# Patient Record
Sex: Female | Born: 1950
Health system: Southern US, Community
[De-identification: ages and names within clinical notes are randomized; demographics above are authoritative.]

## PROBLEM LIST (undated history)

## (undated) DIAGNOSIS — E785 Hyperlipidemia, unspecified: Secondary | ICD-10-CM

## (undated) DIAGNOSIS — I499 Cardiac arrhythmia, unspecified: Secondary | ICD-10-CM

## (undated) DIAGNOSIS — R319 Hematuria, unspecified: Secondary | ICD-10-CM

## (undated) DIAGNOSIS — I10 Essential (primary) hypertension: Secondary | ICD-10-CM

## (undated) DIAGNOSIS — Z87442 Personal history of urinary calculi: Secondary | ICD-10-CM

## (undated) DIAGNOSIS — R51 Headache: Secondary | ICD-10-CM

## (undated) DIAGNOSIS — E119 Type 2 diabetes mellitus without complications: Secondary | ICD-10-CM

## (undated) DIAGNOSIS — N760 Acute vaginitis: Secondary | ICD-10-CM

## (undated) HISTORY — DX: Type 2 diabetes mellitus without complications: E11.9

## (undated) HISTORY — DX: Headache: R51

## (undated) HISTORY — PX: TUBAL LIGATION: SHX77

## (undated) HISTORY — DX: Hyperlipidemia, unspecified: E78.5

## (undated) HISTORY — PX: OTHER SURGICAL HISTORY: SHX169

## (undated) HISTORY — DX: Essential (primary) hypertension: I10

## (undated) HISTORY — DX: Acute vaginitis: N76.0

---
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
## 1996-03-08 HISTORY — PX: OTHER SURGICAL HISTORY: SHX169

## 1996-03-08 HISTORY — PX: TOTAL ABDOMINAL HYSTERECTOMY W/ BILATERAL SALPINGOOPHORECTOMY: SHX83

## 2003-04-08 ENCOUNTER — Ambulatory Visit (HOSPITAL_COMMUNITY): Admission: RE | Admit: 2003-04-08 | Discharge: 2003-04-08 | Payer: Self-pay | Admitting: *Deleted

## 2003-07-02 ENCOUNTER — Ambulatory Visit (HOSPITAL_COMMUNITY): Admission: RE | Admit: 2003-07-02 | Discharge: 2003-07-02 | Payer: Self-pay | Admitting: Family Medicine

## 2004-02-06 ENCOUNTER — Ambulatory Visit (HOSPITAL_COMMUNITY): Admission: RE | Admit: 2004-02-06 | Discharge: 2004-02-06 | Payer: Self-pay | Admitting: Urology

## 2004-02-18 ENCOUNTER — Ambulatory Visit: Payer: Self-pay | Admitting: Family Medicine

## 2004-03-05 ENCOUNTER — Ambulatory Visit (HOSPITAL_COMMUNITY): Admission: RE | Admit: 2004-03-05 | Discharge: 2004-03-05 | Payer: Self-pay | Admitting: Family Medicine

## 2004-04-07 ENCOUNTER — Ambulatory Visit: Payer: Self-pay | Admitting: Family Medicine

## 2004-05-05 ENCOUNTER — Ambulatory Visit: Payer: Self-pay | Admitting: Family Medicine

## 2004-10-07 ENCOUNTER — Ambulatory Visit: Payer: Self-pay | Admitting: Family Medicine

## 2005-03-04 ENCOUNTER — Ambulatory Visit: Payer: Self-pay | Admitting: Family Medicine

## 2005-06-01 ENCOUNTER — Ambulatory Visit: Payer: Self-pay | Admitting: Family Medicine

## 2005-10-07 ENCOUNTER — Ambulatory Visit: Payer: Self-pay | Admitting: Family Medicine

## 2005-10-07 ENCOUNTER — Other Ambulatory Visit: Admission: RE | Admit: 2005-10-07 | Discharge: 2005-10-07 | Payer: Self-pay | Admitting: Family Medicine

## 2005-10-07 ENCOUNTER — Encounter (INDEPENDENT_AMBULATORY_CARE_PROVIDER_SITE_OTHER): Payer: Self-pay | Admitting: *Deleted

## 2005-10-07 LAB — CONVERTED CEMR LAB: Pap Smear: NORMAL

## 2006-01-24 ENCOUNTER — Ambulatory Visit: Payer: Self-pay | Admitting: Family Medicine

## 2006-04-26 ENCOUNTER — Encounter: Payer: Self-pay | Admitting: Family Medicine

## 2006-04-26 LAB — CONVERTED CEMR LAB
BUN: 19 mg/dL (ref 6–23)
CO2: 30 meq/L (ref 19–32)
Calcium: 10.2 mg/dL (ref 8.4–10.5)
Chloride: 101 meq/L (ref 96–112)
Cholesterol: 239 mg/dL — ABNORMAL HIGH (ref 0–200)
Creatinine, Ser: 0.61 mg/dL (ref 0.40–1.20)
Glucose, Bld: 118 mg/dL — ABNORMAL HIGH (ref 70–99)
HDL: 79 mg/dL (ref >39)
Hgb A1c MFr Bld: 7.7 % — ABNORMAL HIGH (ref 4.6–6.1)
LDL Cholesterol: 147 mg/dL — ABNORMAL HIGH (ref 0–99)
Potassium: 4.3 meq/L (ref 3.5–5.3)
Sodium: 142 meq/L (ref 135–145)
Total CHOL/HDL Ratio: 3
Triglycerides: 65 mg/dL (ref <150)
VLDL: 13 mg/dL (ref 0–40)

## 2006-05-03 ENCOUNTER — Ambulatory Visit: Payer: Self-pay | Admitting: Family Medicine

## 2006-05-05 ENCOUNTER — Ambulatory Visit: Payer: Self-pay | Admitting: Family Medicine

## 2006-06-14 ENCOUNTER — Ambulatory Visit: Payer: Self-pay | Admitting: Family Medicine

## 2006-08-05 ENCOUNTER — Encounter: Payer: Self-pay | Admitting: Family Medicine

## 2006-08-05 LAB — CONVERTED CEMR LAB
ALT: 13 units/L (ref 0–35)
AST: 14 units/L (ref 0–37)
Albumin: 5 g/dL (ref 3.5–5.2)
Alkaline Phosphatase: 102 units/L (ref 39–117)
BUN: 17 mg/dL (ref 6–23)
Bilirubin, Direct: 0.1 mg/dL (ref 0.0–0.3)
CO2: 26 meq/L (ref 19–32)
Calcium: 9.4 mg/dL (ref 8.4–10.5)
Chloride: 103 meq/L (ref 96–112)
Cholesterol: 258 mg/dL — ABNORMAL HIGH (ref 0–200)
Creatinine, Ser: 0.63 mg/dL (ref 0.40–1.20)
Glucose, Bld: 123 mg/dL — ABNORMAL HIGH (ref 70–99)
HDL: 84 mg/dL (ref >39)
Hgb A1c MFr Bld: 7.5 % — ABNORMAL HIGH (ref 4.6–6.1)
Indirect Bilirubin: 0.4 mg/dL (ref 0.0–0.9)
LDL Cholesterol: 165 mg/dL — ABNORMAL HIGH (ref 0–99)
Microalb, Ur: 0.54 mg/dL (ref 0.00–1.89)
Potassium: 4 meq/L (ref 3.5–5.3)
Sodium: 142 meq/L (ref 135–145)
Total Bilirubin: 0.5 mg/dL (ref 0.3–1.2)
Total CHOL/HDL Ratio: 3.1
Total Protein: 7.8 g/dL (ref 6.0–8.3)
Triglycerides: 46 mg/dL (ref <150)
VLDL: 9 mg/dL (ref 0–40)

## 2006-08-08 ENCOUNTER — Ambulatory Visit: Payer: Self-pay | Admitting: Family Medicine

## 2006-11-15 ENCOUNTER — Encounter: Payer: Self-pay | Admitting: Family Medicine

## 2006-11-15 LAB — CONVERTED CEMR LAB
ALT: 10 units/L (ref 0–35)
AST: 12 units/L (ref 0–37)
Albumin: 4.9 g/dL (ref 3.5–5.2)
Alkaline Phosphatase: 95 units/L (ref 39–117)
BUN: 19 mg/dL (ref 6–23)
Bilirubin, Direct: 0.1 mg/dL (ref 0.0–0.3)
CO2: 27 meq/L (ref 19–32)
Calcium: 9.6 mg/dL (ref 8.4–10.5)
Chloride: 104 meq/L (ref 96–112)
Cholesterol: 275 mg/dL — ABNORMAL HIGH (ref 0–200)
Creatinine, Ser: 0.66 mg/dL (ref 0.40–1.20)
Glucose, Bld: 139 mg/dL — ABNORMAL HIGH (ref 70–99)
HDL: 91 mg/dL (ref >39)
Indirect Bilirubin: 0.5 mg/dL (ref 0.0–0.9)
LDL Cholesterol: 171 mg/dL — ABNORMAL HIGH (ref 0–99)
Potassium: 4.3 meq/L (ref 3.5–5.3)
Sodium: 144 meq/L (ref 135–145)
Total Bilirubin: 0.6 mg/dL (ref 0.3–1.2)
Total CHOL/HDL Ratio: 3
Total Protein: 7.8 g/dL (ref 6.0–8.3)
Triglycerides: 63 mg/dL (ref <150)
VLDL: 13 mg/dL (ref 0–40)

## 2006-11-17 ENCOUNTER — Ambulatory Visit: Payer: Self-pay | Admitting: Family Medicine

## 2007-01-05 ENCOUNTER — Ambulatory Visit: Payer: Self-pay | Admitting: Family Medicine

## 2007-03-06 ENCOUNTER — Encounter: Payer: Self-pay | Admitting: Family Medicine

## 2007-03-06 LAB — CONVERTED CEMR LAB
ALT: 15 units/L (ref 0–35)
AST: 14 units/L (ref 0–37)
Albumin: 4.9 g/dL (ref 3.5–5.2)
Alkaline Phosphatase: 112 units/L (ref 39–117)
Bilirubin, Direct: 0.1 mg/dL (ref 0.0–0.3)
Cholesterol: 221 mg/dL — ABNORMAL HIGH (ref 0–200)
HDL: 86 mg/dL (ref >39)
Indirect Bilirubin: 0.4 mg/dL (ref 0.0–0.9)
LDL Cholesterol: 124 mg/dL — ABNORMAL HIGH (ref 0–99)
Total Bilirubin: 0.5 mg/dL (ref 0.3–1.2)
Total CHOL/HDL Ratio: 2.6
Total Protein: 7.6 g/dL (ref 6.0–8.3)
Triglycerides: 55 mg/dL (ref <150)
VLDL: 11 mg/dL (ref 0–40)

## 2007-03-08 ENCOUNTER — Ambulatory Visit: Payer: Self-pay | Admitting: Family Medicine

## 2007-06-19 ENCOUNTER — Encounter: Payer: Self-pay | Admitting: Family Medicine

## 2007-06-19 LAB — CONVERTED CEMR LAB
ALT: 19 units/L (ref 0–35)
AST: 14 units/L (ref 0–37)
Albumin: 4.8 g/dL (ref 3.5–5.2)
Alkaline Phosphatase: 111 units/L (ref 39–117)
Bilirubin, Direct: 0.1 mg/dL (ref 0.0–0.3)
Cholesterol: 254 mg/dL — ABNORMAL HIGH (ref 0–200)
HDL: 80 mg/dL (ref >39)
Indirect Bilirubin: 0.3 mg/dL (ref 0.0–0.9)
LDL Cholesterol: 155 mg/dL — ABNORMAL HIGH (ref 0–99)
Total Bilirubin: 0.4 mg/dL (ref 0.3–1.2)
Total CHOL/HDL Ratio: 3.2
Total Protein: 7.6 g/dL (ref 6.0–8.3)
Triglycerides: 95 mg/dL (ref <150)
VLDL: 19 mg/dL (ref 0–40)

## 2007-06-20 ENCOUNTER — Ambulatory Visit: Payer: Self-pay | Admitting: Family Medicine

## 2007-06-28 ENCOUNTER — Ambulatory Visit (HOSPITAL_COMMUNITY): Admission: RE | Admit: 2007-06-28 | Discharge: 2007-06-28 | Payer: Self-pay | Admitting: Family Medicine

## 2007-06-30 ENCOUNTER — Encounter (INDEPENDENT_AMBULATORY_CARE_PROVIDER_SITE_OTHER): Payer: Self-pay | Admitting: *Deleted

## 2007-06-30 DIAGNOSIS — E785 Hyperlipidemia, unspecified: Secondary | ICD-10-CM | POA: Insufficient documentation

## 2007-06-30 DIAGNOSIS — I10 Essential (primary) hypertension: Secondary | ICD-10-CM | POA: Insufficient documentation

## 2007-09-19 ENCOUNTER — Ambulatory Visit (HOSPITAL_COMMUNITY): Admission: RE | Admit: 2007-09-19 | Discharge: 2007-09-19 | Payer: Self-pay | Admitting: General Surgery

## 2007-09-19 LAB — HM COLONOSCOPY: HM Colonoscopy: NORMAL

## 2007-12-07 ENCOUNTER — Encounter: Payer: Self-pay | Admitting: Family Medicine

## 2007-12-21 ENCOUNTER — Encounter: Payer: Self-pay | Admitting: Family Medicine

## 2007-12-22 ENCOUNTER — Ambulatory Visit: Payer: Self-pay | Admitting: Family Medicine

## 2007-12-22 LAB — CONVERTED CEMR LAB
ALT: 17 units/L (ref 0–35)
AST: 14 units/L (ref 0–37)
Albumin: 4.9 g/dL (ref 3.5–5.2)
Alkaline Phosphatase: 97 units/L (ref 39–117)
BUN: 15 mg/dL (ref 6–23)
Bilirubin, Direct: 0.1 mg/dL (ref 0.0–0.3)
CO2: 26 meq/L (ref 19–32)
Calcium: 9.9 mg/dL (ref 8.4–10.5)
Chloride: 102 meq/L (ref 96–112)
Cholesterol: 222 mg/dL — ABNORMAL HIGH (ref 0–200)
Creatinine, Ser: 0.58 mg/dL (ref 0.40–1.20)
Glucose, Bld: 151 mg/dL — ABNORMAL HIGH (ref 70–99)
Glucose, Bld: 148 mg/dL
HDL: 75 mg/dL (ref >39)
Hgb A1c MFr Bld: 8 %
Indirect Bilirubin: 0.4 mg/dL (ref 0.0–0.9)
LDL Cholesterol: 131 mg/dL — ABNORMAL HIGH (ref 0–99)
Potassium: 4.3 meq/L (ref 3.5–5.3)
Sodium: 143 meq/L (ref 135–145)
Total Bilirubin: 0.5 mg/dL (ref 0.3–1.2)
Total CHOL/HDL Ratio: 3
Total Protein: 7.5 g/dL (ref 6.0–8.3)
Triglycerides: 81 mg/dL (ref <150)
VLDL: 16 mg/dL (ref 0–40)

## 2008-01-01 ENCOUNTER — Encounter: Payer: Self-pay | Admitting: Family Medicine

## 2008-01-02 ENCOUNTER — Encounter: Payer: Self-pay | Admitting: Family Medicine

## 2008-01-04 ENCOUNTER — Encounter: Payer: Self-pay | Admitting: Family Medicine

## 2008-01-05 ENCOUNTER — Encounter: Payer: Self-pay | Admitting: Family Medicine

## 2008-01-11 ENCOUNTER — Encounter: Payer: Self-pay | Admitting: Family Medicine

## 2008-01-15 ENCOUNTER — Encounter: Payer: Self-pay | Admitting: Family Medicine

## 2008-03-12 ENCOUNTER — Telehealth: Payer: Self-pay | Admitting: Family Medicine

## 2008-03-12 ENCOUNTER — Encounter: Payer: Self-pay | Admitting: Family Medicine

## 2008-03-13 LAB — CONVERTED CEMR LAB
ALT: 13 units/L (ref 0–35)
AST: 14 units/L (ref 0–37)
Albumin: 4.9 g/dL (ref 3.5–5.2)
Alkaline Phosphatase: 87 units/L (ref 39–117)
BUN: 16 mg/dL (ref 6–23)
Bilirubin, Direct: 0.1 mg/dL (ref 0.0–0.3)
CO2: 24 meq/L (ref 19–32)
Calcium: 9.8 mg/dL (ref 8.4–10.5)
Chloride: 103 meq/L (ref 96–112)
Cholesterol: 233 mg/dL — ABNORMAL HIGH (ref 0–200)
Creatinine, Ser: 0.46 mg/dL (ref 0.40–1.20)
Glucose, Bld: 124 mg/dL — ABNORMAL HIGH (ref 70–99)
HDL: 86 mg/dL (ref >39)
Indirect Bilirubin: 0.4 mg/dL (ref 0.0–0.9)
LDL Cholesterol: 135 mg/dL — ABNORMAL HIGH (ref 0–99)
Potassium: 4 meq/L (ref 3.5–5.3)
Sodium: 141 meq/L (ref 135–145)
Total Bilirubin: 0.5 mg/dL (ref 0.3–1.2)
Total CHOL/HDL Ratio: 2.7
Total Protein: 7.9 g/dL (ref 6.0–8.3)
Triglycerides: 58 mg/dL (ref <150)
VLDL: 12 mg/dL (ref 0–40)

## 2008-03-14 ENCOUNTER — Ambulatory Visit: Payer: Self-pay | Admitting: Family Medicine

## 2008-03-14 LAB — CONVERTED CEMR LAB
Glucose, Bld: 104 mg/dL
Hgb A1c MFr Bld: 7.2 %

## 2008-03-15 ENCOUNTER — Encounter: Payer: Self-pay | Admitting: Family Medicine

## 2008-03-15 LAB — CONVERTED CEMR LAB
Creatinine, Urine: 82.5 mg/dL
Microalb Creat Ratio: 6.7 mg/g (ref 0.0–30.0)
Microalb, Ur: 0.55 mg/dL (ref 0.00–1.89)

## 2008-03-19 ENCOUNTER — Ambulatory Visit (HOSPITAL_COMMUNITY): Admission: RE | Admit: 2008-03-19 | Discharge: 2008-03-19 | Payer: Self-pay | Admitting: Urology

## 2008-03-25 ENCOUNTER — Encounter: Payer: Self-pay | Admitting: Family Medicine

## 2008-05-20 ENCOUNTER — Encounter: Payer: Self-pay | Admitting: Family Medicine

## 2008-06-12 LAB — CONVERTED CEMR LAB
ALT: 16 units/L (ref 0–35)
AST: 18 units/L (ref 0–37)
Albumin: 4.9 g/dL (ref 3.5–5.2)
Alkaline Phosphatase: 90 units/L (ref 39–117)
BUN: 15 mg/dL (ref 6–23)
Bilirubin, Direct: 0.1 mg/dL (ref 0.0–0.3)
CO2: 27 meq/L (ref 19–32)
Calcium: 9.6 mg/dL (ref 8.4–10.5)
Chloride: 101 meq/L (ref 96–112)
Cholesterol: 221 mg/dL — ABNORMAL HIGH (ref 0–200)
Creatinine, Ser: 0.57 mg/dL (ref 0.40–1.20)
Creatinine, Urine: 166.9 mg/dL
Glucose, Bld: 109 mg/dL — ABNORMAL HIGH (ref 70–99)
HDL: 84 mg/dL (ref >39)
Indirect Bilirubin: 0.4 mg/dL (ref 0.0–0.9)
LDL Cholesterol: 125 mg/dL — ABNORMAL HIGH (ref 0–99)
Microalb Creat Ratio: 5.9 mg/g (ref 0.0–30.0)
Microalb, Ur: 0.99 mg/dL (ref 0.00–1.89)
Potassium: 4.1 meq/L (ref 3.5–5.3)
Sodium: 144 meq/L (ref 135–145)
Total Bilirubin: 0.5 mg/dL (ref 0.3–1.2)
Total CHOL/HDL Ratio: 2.6
Total Protein: 7.1 g/dL (ref 6.0–8.3)
Triglycerides: 59 mg/dL (ref <150)
VLDL: 12 mg/dL (ref 0–40)

## 2008-06-13 ENCOUNTER — Ambulatory Visit: Payer: Self-pay | Admitting: Family Medicine

## 2008-06-13 DIAGNOSIS — L919 Hypertrophic disorder of the skin, unspecified: Secondary | ICD-10-CM

## 2008-06-13 DIAGNOSIS — L909 Atrophic disorder of skin, unspecified: Secondary | ICD-10-CM | POA: Insufficient documentation

## 2008-06-13 DIAGNOSIS — L989 Disorder of the skin and subcutaneous tissue, unspecified: Secondary | ICD-10-CM | POA: Insufficient documentation

## 2008-06-13 HISTORY — DX: Atrophic disorder of skin, unspecified: L90.9

## 2008-06-13 HISTORY — DX: Atrophic disorder of skin, unspecified: L91.9

## 2008-06-13 LAB — CONVERTED CEMR LAB: Blood Glucose, Fasting: 128 mg/dL

## 2008-06-14 ENCOUNTER — Telehealth: Payer: Self-pay | Admitting: Family Medicine

## 2008-06-14 DIAGNOSIS — E1169 Type 2 diabetes mellitus with other specified complication: Secondary | ICD-10-CM | POA: Insufficient documentation

## 2008-06-17 ENCOUNTER — Telehealth: Payer: Self-pay | Admitting: Family Medicine

## 2008-08-16 ENCOUNTER — Telehealth: Payer: Self-pay | Admitting: Family Medicine

## 2008-08-16 ENCOUNTER — Encounter: Payer: Self-pay | Admitting: Family Medicine

## 2008-08-20 ENCOUNTER — Encounter: Payer: Self-pay | Admitting: Family Medicine

## 2008-08-21 ENCOUNTER — Encounter: Payer: Self-pay | Admitting: Family Medicine

## 2008-09-20 ENCOUNTER — Ambulatory Visit: Payer: Self-pay | Admitting: Family Medicine

## 2008-09-20 LAB — CONVERTED CEMR LAB: Blood Glucose, Fasting: 86 mg/dL

## 2008-09-23 LAB — CONVERTED CEMR LAB
ALT: 13 units/L (ref 0–35)
AST: 13 units/L (ref 0–37)
Albumin: 4.4 g/dL (ref 3.5–5.2)
Alkaline Phosphatase: 93 units/L (ref 39–117)
Bilirubin, Direct: 0.1 mg/dL (ref 0.0–0.3)
Cholesterol: 229 mg/dL — ABNORMAL HIGH (ref 0–200)
HDL: 79 mg/dL (ref >39)
Hgb A1c MFr Bld: 7.4 % — ABNORMAL HIGH (ref 4.6–6.1)
Indirect Bilirubin: 0.4 mg/dL (ref 0.0–0.9)
LDL Cholesterol: 136 mg/dL — ABNORMAL HIGH (ref 0–99)
Total Bilirubin: 0.5 mg/dL (ref 0.3–1.2)
Total CHOL/HDL Ratio: 2.9
Total Protein: 7 g/dL (ref 6.0–8.3)
Triglycerides: 69 mg/dL (ref <150)
VLDL: 14 mg/dL (ref 0–40)

## 2008-10-11 ENCOUNTER — Ambulatory Visit: Payer: Self-pay | Admitting: Family Medicine

## 2008-10-11 DIAGNOSIS — R5383 Other fatigue: Secondary | ICD-10-CM

## 2008-10-11 DIAGNOSIS — R5381 Other malaise: Secondary | ICD-10-CM | POA: Insufficient documentation

## 2008-10-15 LAB — CONVERTED CEMR LAB
HCT: 43.2 % (ref 36.0–46.0)
Hemoglobin: 13.4 g/dL (ref 12.0–15.0)
MCHC: 31 g/dL (ref 30.0–36.0)
MCV: 92.9 fL (ref 78.0–100.0)
Platelets: 370 10*3/uL (ref 150–400)
RBC: 4.65 M/uL (ref 3.87–5.11)
RDW: 13.8 % (ref 11.5–15.5)
TSH: 1.938 microintl units/mL (ref 0.350–4.500)
WBC: 4.9 10*3/uL (ref 4.0–10.5)

## 2008-10-28 ENCOUNTER — Telehealth: Payer: Self-pay | Admitting: Family Medicine

## 2009-02-05 ENCOUNTER — Telehealth: Payer: Self-pay | Admitting: Family Medicine

## 2009-05-01 ENCOUNTER — Encounter: Payer: Self-pay | Admitting: Family Medicine

## 2009-05-01 LAB — CONVERTED CEMR LAB
ALT: 12 U/L
AST: 13 U/L
Albumin: 4.5 g/dL
Alkaline Phosphatase: 95 U/L
BUN: 17 mg/dL
Bilirubin, Direct: 0.1 mg/dL
CO2: 27 meq/L
Calcium: 9.8 mg/dL
Chloride: 100 meq/L
Cholesterol: 230 mg/dL — ABNORMAL HIGH
Creatinine, Ser: 0.62 mg/dL
Glucose, Bld: 86 mg/dL
HDL: 99 mg/dL
Indirect Bilirubin: 0.4 mg/dL
LDL Cholesterol: 120 mg/dL — ABNORMAL HIGH
Potassium: 4.6 meq/L
Sodium: 140 meq/L
Total Bilirubin: 0.5 mg/dL
Total CHOL/HDL Ratio: 2.3
Total Protein: 7 g/dL
Triglycerides: 57 mg/dL
VLDL: 11 mg/dL

## 2009-05-02 ENCOUNTER — Ambulatory Visit: Payer: Self-pay | Admitting: Family Medicine

## 2009-05-02 LAB — CONVERTED CEMR LAB: Blood Glucose, Fasting: 129 mg/dL

## 2009-10-23 LAB — CONVERTED CEMR LAB
Cholesterol: 246 mg/dL — ABNORMAL HIGH (ref 0–200)
Creatinine, Urine: 134.1 mg/dL
HDL: 84 mg/dL (ref >39)
Hgb A1c MFr Bld: 7.6 % — ABNORMAL HIGH (ref <5.7)
LDL Cholesterol: 148 mg/dL — ABNORMAL HIGH (ref 0–99)
Microalb Creat Ratio: 7.3 mg/g (ref 0.0–30.0)
Microalb, Ur: 0.98 mg/dL (ref 0.00–1.89)
Total CHOL/HDL Ratio: 2.9
Triglycerides: 70 mg/dL (ref <150)
VLDL: 14 mg/dL (ref 0–40)

## 2009-10-24 ENCOUNTER — Ambulatory Visit: Payer: Self-pay | Admitting: Family Medicine

## 2009-10-24 DIAGNOSIS — R002 Palpitations: Secondary | ICD-10-CM | POA: Insufficient documentation

## 2009-10-29 ENCOUNTER — Ambulatory Visit: Payer: Self-pay | Admitting: Family Medicine

## 2009-10-29 ENCOUNTER — Telehealth (INDEPENDENT_AMBULATORY_CARE_PROVIDER_SITE_OTHER): Payer: Self-pay | Admitting: *Deleted

## 2009-10-29 DIAGNOSIS — M25569 Pain in unspecified knee: Secondary | ICD-10-CM | POA: Insufficient documentation

## 2009-11-04 ENCOUNTER — Ambulatory Visit (HOSPITAL_COMMUNITY)
Admission: RE | Admit: 2009-11-04 | Discharge: 2009-11-04 | Payer: Self-pay | Source: Home / Self Care | Admitting: Family Medicine

## 2010-01-27 ENCOUNTER — Ambulatory Visit: Payer: Self-pay | Admitting: Family Medicine

## 2010-01-27 DIAGNOSIS — M546 Pain in thoracic spine: Secondary | ICD-10-CM | POA: Insufficient documentation

## 2010-01-27 HISTORY — DX: Pain in thoracic spine: M54.6

## 2010-01-27 LAB — HM DIABETES FOOT EXAM

## 2010-03-24 ENCOUNTER — Encounter: Payer: Self-pay | Admitting: Family Medicine

## 2010-03-24 ENCOUNTER — Ambulatory Visit (HOSPITAL_COMMUNITY)
Admission: RE | Admit: 2010-03-24 | Discharge: 2010-03-24 | Payer: Self-pay | Source: Home / Self Care | Attending: Family Medicine | Admitting: Family Medicine

## 2010-03-24 ENCOUNTER — Ambulatory Visit
Admission: RE | Admit: 2010-03-24 | Discharge: 2010-03-24 | Payer: Self-pay | Source: Home / Self Care | Attending: Family Medicine | Admitting: Family Medicine

## 2010-03-24 DIAGNOSIS — L089 Local infection of the skin and subcutaneous tissue, unspecified: Secondary | ICD-10-CM | POA: Insufficient documentation

## 2010-03-24 DIAGNOSIS — M542 Cervicalgia: Secondary | ICD-10-CM | POA: Insufficient documentation

## 2010-03-24 DIAGNOSIS — L723 Sebaceous cyst: Secondary | ICD-10-CM | POA: Insufficient documentation

## 2010-03-24 LAB — CONVERTED CEMR LAB
ALT: 10 units/L (ref 0–35)
AST: 12 units/L (ref 0–37)
Albumin: 4.8 g/dL (ref 3.5–5.2)
Alkaline Phosphatase: 95 units/L (ref 39–117)
BUN: 19 mg/dL (ref 6–23)
Basophils Absolute: 0 10*3/uL (ref 0.0–0.1)
Basophils Relative: 1 % (ref 0–1)
Bilirubin, Direct: 0.1 mg/dL (ref 0.0–0.3)
CO2: 15 meq/L — ABNORMAL LOW (ref 19–32)
Calcium: 9.9 mg/dL (ref 8.4–10.5)
Chloride: 99 meq/L (ref 96–112)
Cholesterol: 247 mg/dL — ABNORMAL HIGH (ref 0–200)
Creatinine, Ser: 0.67 mg/dL (ref 0.40–1.20)
Eosinophils Absolute: 0 10*3/uL (ref 0.0–0.7)
Eosinophils Relative: 1 % (ref 0–5)
Glucose, Bld: 148 mg/dL — ABNORMAL HIGH (ref 70–99)
HCT: 39.5 % (ref 36.0–46.0)
HDL: 73 mg/dL (ref >39)
Hemoglobin: 12.9 g/dL (ref 12.0–15.0)
Hgb A1c MFr Bld: 7.8 % — ABNORMAL HIGH (ref <5.7)
Indirect Bilirubin: 0.5 mg/dL (ref 0.0–0.9)
LDL Cholesterol: 160 mg/dL — ABNORMAL HIGH (ref 0–99)
Lymphocytes Relative: 38 % (ref 12–46)
Lymphs Abs: 1.4 10*3/uL (ref 0.7–4.0)
MCHC: 32.7 g/dL (ref 30.0–36.0)
MCV: 89.4 fL (ref 78.0–100.0)
Monocytes Absolute: 0.2 10*3/uL (ref 0.1–1.0)
Monocytes Relative: 6 % (ref 3–12)
Neutro Abs: 2 10*3/uL (ref 1.7–7.7)
Neutrophils Relative %: 55 % (ref 43–77)
Platelets: 339 10*3/uL (ref 150–400)
Potassium: 4.1 meq/L (ref 3.5–5.3)
RBC: 4.42 M/uL (ref 3.87–5.11)
RDW: 13.2 % (ref 11.5–15.5)
Sodium: 139 meq/L (ref 135–145)
TSH: 0.797 microintl units/mL (ref 0.350–4.500)
Total Bilirubin: 0.6 mg/dL (ref 0.3–1.2)
Total CHOL/HDL Ratio: 3.4
Total Protein: 7.2 g/dL (ref 6.0–8.3)
Triglycerides: 69 mg/dL (ref <150)
VLDL: 14 mg/dL (ref 0–40)
WBC: 3.6 10*3/uL — ABNORMAL LOW (ref 4.0–10.5)

## 2010-03-25 ENCOUNTER — Encounter: Payer: Self-pay | Admitting: Family Medicine

## 2010-03-26 ENCOUNTER — Ambulatory Visit (HOSPITAL_COMMUNITY)
Admission: RE | Admit: 2010-03-26 | Discharge: 2010-03-26 | Payer: Self-pay | Source: Home / Self Care | Attending: Family Medicine | Admitting: Family Medicine

## 2010-03-28 ENCOUNTER — Encounter: Payer: Self-pay | Admitting: Family Medicine

## 2010-03-29 ENCOUNTER — Encounter: Payer: Self-pay | Admitting: Family Medicine

## 2010-03-30 ENCOUNTER — Encounter: Payer: Self-pay | Admitting: Family Medicine

## 2010-04-02 ENCOUNTER — Encounter: Payer: Self-pay | Admitting: Family Medicine

## 2010-04-07 NOTE — Progress Notes (Signed)
Summary: call  Phone Note Call from Patient   Summary of Call: the medicine you gave her at last visit had a reaction on her her face, eyes, and throat swelled up so she is going to stop taking it  ONGLYZA the medicine  Initial call taken by: Lind Guest,  June 17, 2008 1:25 PM  Follow-up for Phone Call        advise her I agree with stopping the med, and pls not the allergic rxn on her chart, also tell her she needs to rest her sugars regularly and if they are still high the only other option is adding insulin Follow-up by: Syliva Overman MD,  June 23, 2008 6:56 PM  Additional Follow-up for Phone Call Additional follow up Details #1::        Phone Call Completed Additional Follow-up by: Worthy Keeler LPN,  June 24, 2008 10:02 AM   New Allergies: ! * ONGLYZA New Allergies: ! * ONGLYZA

## 2010-04-07 NOTE — Assessment & Plan Note (Signed)
Summary: office visit   Vital Signs:  Patient profile:   60 year old female Menstrual status:  hysterectomy Height:      65 inches (165.10 cm) Weight:      138 pounds (62.73 kg) BMI:     23.05 BSA:     1.69 O2 Sat:      98 % Pulse rate:   76 / minute Resp:     16 per minute BP sitting:   128 / 78  (left arm) Cuff size:   regular  Vitals Entered By: Everitt Amber (June 13, 2008 8:53 AM) CC: Follow up chronic problems, DM, HTN  years   days  Menstrual Status hysterectomy Last PAP Result Normal   CC:  Follow up chronic problems, DM, and HTN.  History of Present Illness: Patient reports doing well.  Denies any recent fever or chills.  Denies any appetite change or change in bowel movements. Patient denies depression, anxiety or insomnia. the pt states that she feels strongly that  her blood sugars are much better, based on her tests, however she is very disappointed tosee that herhBA1C is not under 7, sheis now willing to add a third drug to her regime, and since she has satrted working and become more active she feels as though her numbers will improve .  She denies polyuria, polydypsia, blurred vision or hypoglycemic episodes.  She has concerns about skin lesions and wants a dermatologist to evaluate her skin  Preventive Screening-Counseling & Management     Smoking Status: never  Current Problems (verified): 1)  Diabetes Mellitus, Type II  (ICD-250.00) 2)  Other Screening Mammogram  (ICD-V76.12) 3)  Skin Lesions, Multiple  (ICD-709.9) 4)  Skin Tag  (ICD-701.9) 5)  Hyperlipidemia  (ICD-272.4) 6)  Hypertension  (ICD-401.9)  Current Medications (verified): 1)  Glyburide-Metformin 5-500 Mg  Tabs (Glyburide-Metformin) .... Two Tabs By Mouth Two Times A Day 2)  Mevacor 20 Mg Tabs (Lovastatin) .... Take 1 Tab By Mouth At Bedtime 3)  Moexipril-Hydrochlorothiazide 7.5-12.5 Mg Tabs (Moexipril-Hydrochlorothiazide) .... Take One and One Half Tabs Once Daily 4)  Onglyza 5 Mg Tabs  (Saxagliptin Hcl) .... Take 1 Tablet By Mouth Once A Day 5)  Metanx 2.8-25-2 Mg Tabs (L-Methylfolate-B6-B12) .... Take 1 Tablet By Mouth Once A Day  Allergies (verified): No Known Drug Allergies  Review of Systems General:  Denies chills and fever. ENT:  Denies earache, postnasal drainage, ringing in ears, and sore throat. CV:  Denies chest pain or discomfort, palpitations, shortness of breath with exertion, and swelling of feet. Resp:  Denies cough, sputum productive, and wheezing. GI:  Denies abdominal pain, constipation, nausea, and vomiting. GU:  Denies dysuria. MS:  Denies joint pain and stiffness. Derm:  Complains of itching, lesion(s), and rash; hyperpigmented left foot rash and skin tags. Psych:  Denies anxiety and depression. Endo:  Denies cold intolerance, excessive hunger, excessive thirst, excessive urination, heat intolerance, polyuria, and weight change.  Physical Exam  General:  alert, well-nourished, and well-hydrated.  HEENT: No facial asymmetry,  EOMI, No sinus tenderness, TM's Clear, oropharynx  pink and moist.   Chest: Clear to auscultation bilaterally.  CVS: S1, S2, No murmurs, No S3.   Abd: Soft, Nontender.  MS: Adequate ROM spine, hips, shoulders and knees.  Ext: No edema.   CNS: CN 2-12 intact, power tone and sensation normal throughout.   Skin: Intact, skin tags, and hyperpigmented macular lesions on hands and sole  Psych: Good eye contact, normal affect.  Memory intact,  not anxious or depressed appearing.     Impression & Recommendations:  Problem # 1:  SKIN LESIONS, MULTIPLE (ICD-709.9) Assessment Comment Only  Orders: Dermatology Referral (Derma)  Problem # 2:  DIABETES MELLITUS, TYPE II (ICD-250.00) Assessment: Improved  The following medications were removed from the medication list:    Avandia 8 Mg Tabs (Rosiglitazone maleate) ..... One tab by mouth qd Her updated medication list for this problem includes:    Glyburide-metformin 5-500 Mg  Tabs (Glyburide-metformin) .Marland Kitchen..Marland Kitchen Two tabs by mouth two times a day    Moexipril-hydrochlorothiazide 7.5-12.5 Mg Tabs (Moexipril-hydrochlorothiazide) .Marland Kitchen... Take one and one half tabs once daily    Onglyza 5 Mg Tabs (Saxagliptin hcl) .Marland Kitchen... Take 1 tablet by mouth once a day  Labs Reviewed: Creat: 0.57 (06/11/2008)    Reviewed HgBA1c results: 7.2 (03/14/2008)  8.0 (12/22/2007)  Problem # 3:  HYPERTENSION (ICD-401.9) Assessment: Unchanged  Her updated medication list for this problem includes:    Moexipril-hydrochlorothiazide 7.5-12.5 Mg Tabs (Moexipril-hydrochlorothiazide) .Marland Kitchen... Take one and one half tabs once daily  BP today: 128/78 Prior BP: 120/76 (01/04/2008)  Labs Reviewed: K+: 4.1 (06/11/2008) Creat: : 0.57 (06/11/2008)   Chol: 221 (06/11/2008)   HDL: 84 (06/11/2008)   LDL: 125 (06/11/2008)   TG: 59 (06/11/2008)  Problem # 4:  HYPERLIPIDEMIA (ICD-272.4) Assessment: Improved  Her updated medication list for this problem includes:    Mevacor 20 Mg Tabs (Lovastatin) .Marland Kitchen... Take 1 tab by mouth at bedtime, pt has not been taking the medication but plans to do so for the next 3 months  Orders: T-Hepatic Function [id number]) T-Lipid Profile [phone])  Labs Reviewed: SGOT: 18 (06/11/2008)   SGPT: 16 (06/11/2008)   HDL:84 (06/11/2008), 86 (03/12/2008)  LDL:125 (06/11/2008), 135 (03/12/2008)  Chol:221 (06/11/2008), 233 (03/12/2008)  Trig:59 (06/11/2008), 58 (03/12/2008)  Complete Medication List: 1)  Glyburide-metformin 5-500 Mg Tabs (Glyburide-metformin) .... Two tabs by mouth two times a day 2)  Mevacor 20 Mg Tabs (Lovastatin) .... Take 1 tab by mouth at bedtime 3)  Moexipril-hydrochlorothiazide 7.5-12.5 Mg Tabs (Moexipril-hydrochlorothiazide) .... Take one and one half tabs once daily 4)  Onglyza 5 Mg Tabs (Saxagliptin hcl) .... Take 1 tablet by mouth once a day 5)  Metanx 2.8-25-2 Mg Tabs (L-methylfolate-b6-b12) .... Take 1 tablet by mouth once a day  Other Orders: T-  Hemoglobin A1C ([id number]) Glucose, (CBG) (57846) Radiology Referral (Radiology)  Patient Instructions: 1)  Please schedule a follow-up appointment in 3 months. 2)  It is important that you exercise regularly at least 20 minutes 5 times a week. If you develop chest pain, have severe difficulty breathing, or feel very tired , stop exercising immediately and seek medical attention. 3)  You need to lose weight. Consider a lower calorie diet and regular exercise.  4)  pLEASE start the cholesterol med to reduce your cholesterol, also new is onglyza 1 daily for diabetes, continue your current med. 5)  You are being referred for a meamogram and to the dermatologist. 6)  Hepatic Panel prior to visit, ICD-9: 7)  Lipid Panel prior to visit, ICD-9:   fasting in 3 months 8)  HbgA1C prior to visit, ICD-9: Prescriptions: ONGLYZA 5 MG TABS (SAXAGLIPTIN HCL) Take 1 tablet by mouth once a day  #90 x 0   Entered by:   Worthy Keeler LPN   Authorized by:   Syliva Overman MD   Signed by:   Syliva Overman MD on 06/13/2008   Method used:   Samples Given   RxID:   [id number]  METANX 2.8-25-2 MG TABS (L-METHYLFOLATE-B6-B12) Take 1 tablet by mouth once a day  #30 x 4   Entered and Authorized by:   Syliva Overman MD   Signed by:   Syliva Overman MD on 06/13/2008   Method used:   Samples Given   RxID:   [id number] ONGLYZA 5 MG TABS (SAXAGLIPTIN HCL) Take 1 tablet by mouth once a day  #30 x 2   Entered and Authorized by:   Syliva Overman MD   Signed by:   Syliva Overman MD on 06/13/2008   Method used:   Samples Given   RxID:   [id number]   Laboratory Results   Blood Tests   Date/Time Received: June 13, 2008  Date/Time Reported: June 13, 2008 9am  Glucose (fasting): 128 mg/dL   (Normal Range: 84-696)

## 2010-04-07 NOTE — Assessment & Plan Note (Signed)
Summary: pain in leg- room 1   Vital Signs:  Patient profile:   60 year old female Menstrual status:  hysterectomy Height:      65 inches Weight:      138 pounds BMI:     23.05 O2 Sat:      99 % on Room air Pulse rate:   92 / minute Resp:     16 per minute BP sitting:   130 / 82  (left arm)  Vitals Entered By: Adella Hare LPN (October 29, 2009 10:25 AM) CC: complains of ache in lower left thigh/behind knee- aches and somtimes shooting pain Is Patient Diabetic? Yes Did you bring your meter with you today? No   CC:  complains of ache in lower left thigh/behind knee- aches and somtimes shooting pain.  History of Present Illness: Pt presents today with c/o pain posterior knee x approx 5 days.  Denies trauma.  No hx of knee problems other than occasionally cracking or popping.  No swelling in knee or lower leg. No redness.  Pain is intermittent, and can shoot down leg sometimes to foot.  Describes it as a pinching or sharp pain.  Can awaken her from sleep.  Pain is not worsened by wt bearing or walking.  Often occurs when sitting or lying down.  Has not tried over the counter pain relievers etc.  Pt has hx of DM.  Was seen last week for routine check up.  Has chronic numbness in her feet.  No change.  Denies numbness or tingling in leg.  Pt is concerned that this pain may be related to her diabetes or a blood clot.  Allergies (verified): 1)  ! * Onglyza  Past History:  Past medical history reviewed for relevance to current acute and chronic problems.  Past Medical History: Reviewed history from 06/30/2007 and no changes required. Current Problems:  HEADACHE (ICD-784.0) VAGINITIS (ICD-616.10) DIABETES MELLITUS, TYPE I (ICD-250.01) HYPERLIPIDEMIA (ICD-272.4) HYPERTENSION (ICD-401.9)  Review of Systems MS:  Complains of joint pain; denies joint redness, low back pain, and cramps.  Physical Exam  General:  Well-developed,well-nourished,in no acute distress; alert,appropriate  and cooperative throughout examination Head:  Normocephalic and atraumatic without obvious abnormalities. No apparent alopecia or balding. Lungs:  Normal respiratory effort, chest expands symmetrically. Lungs are clear to auscultation, no crackles or wheezes. Heart:  Normal rate and regular rhythm. S1 and S2 normal without gallop, murmur, click, rub or other extra sounds. Msk:  LLE:  FROM Lt knee.  Nontender to palp.  Able to palpate nodule in Lt popliteal fossa, not palpable in Rt.  Remainder of LLE exam is negative. Pulses:  R posterior tibial normal, R dorsalis pedis normal, L posterior tibial normal, and L dorsalis pedis normal.   Extremities:  No edema.  Neg Homans Neurologic:  alert & oriented X3, sensation intact to light touch, and gait normal.   Skin:  Intact without suspicious lesions or rashes Psych:  Cognition and judgment appear intact. Alert and cooperative with normal attention span and concentration. No apparent delusions, illusions, hallucinations   Impression & Recommendations:  Problem # 1:  KNEE PAIN, LEFT (ICD-719.46) Assessment New  Question of Bakers Cyst.  Reassurance to pt not typical syptoms of blood clot.  Her updated medication list for this problem includes:    Aspir-low 81 Mg Tbec (Aspirin) .Marland Kitchen... Take 1 tablet by mouth once a day    Tramadol Hcl 50 Mg Tabs (Tramadol hcl) .Marland Kitchen... Take 1 every 6 hrs as needed for  pain  Orders: Miscellaneous Other Radiology (Misc Other Rad)  Problem # 2:  DIABETES MELLITUS, TYPE II (ICD-250.00) Assessment: Comment Only  Her updated medication list for this problem includes:    Glyburide-metformin 5-500 Mg Tabs (Glyburide-metformin) .Marland Kitchen..Marland Kitchen Two tabs by mouth two times a day    Moexipril-hydrochlorothiazide 7.5-12.5 Mg Tabs (Moexipril-hydrochlorothiazide) .Marland Kitchen... Take one and one half tabs once daily    Aspir-low 81 Mg Tbec (Aspirin) .Marland Kitchen... Take 1 tablet by mouth once a day  Problem # 3:  HYPERTENSION (ICD-401.9)  Her updated  medication list for this problem includes:    Moexipril-hydrochlorothiazide 7.5-12.5 Mg Tabs (Moexipril-hydrochlorothiazide) .Marland Kitchen... Take one and one half tabs once daily  BP today: 130/82 Prior BP: 124/82 (10/24/2009)  Labs Reviewed: K+: 4.6 (05/01/2009) Creat: : 0.62 (05/01/2009)   Chol: 246 (10/22/2009)   HDL: 84 (10/22/2009)   LDL: 148 (10/22/2009)   TG: 70 (10/22/2009)  Complete Medication List: 1)  Glyburide-metformin 5-500 Mg Tabs (Glyburide-metformin) .... Two tabs by mouth two times a day 2)  Moexipril-hydrochlorothiazide 7.5-12.5 Mg Tabs (Moexipril-hydrochlorothiazide) .... Take one and one half tabs once daily 3)  Lovastatin 40 Mg Tabs (Lovastatin) .... Take 1 tab by mouth at bedtime 4)  Aspir-low 81 Mg Tbec (Aspirin) .... Take 1 tablet by mouth once a day 5)  Tramadol Hcl 50 Mg Tabs (Tramadol hcl) .... Take 1 every 6 hrs as needed for pain  Patient Instructions: 1)  Please schedule a follow-up appointment as needed for your knee pain. 2)  Take 1-2 Aleve two times a day. 3)  I have also prescribed Tramadol for pain to use as needed. 4)  Watch for worsening syptoms, swelling in lower leg or redness. 5)  I have ordered an ultrasound of your knee to look for a cyst. Prescriptions: TRAMADOL HCL 50 MG TABS (TRAMADOL HCL) take 1 every 6 hrs as needed for pain  #30 x 0   Entered and Authorized by:   Esperanza Sheets PA   Signed by:   Esperanza Sheets PA on 10/29/2009   Method used:   Electronically to        [facility] [phone]* (retail)       [address]       [place], [place]  [address]       Ph: [phone] or [phone]       Fax: [phone]   RxID:   [id number]

## 2010-04-07 NOTE — Miscellaneous (Signed)
Summary: refill  Clinical Lists Changes  Medications: Added new medication of UNIVASC 7.5 MG TABS (MOEXIPRIL HCL) one and one half tabs by mouth qd - Signed Rx of UNIVASC 7.5 MG TABS (MOEXIPRIL HCL) one and one half tabs by mouth qd;  #45 x 3;  Signed;  Entered by: Worthy Keeler LPN;  Authorized by: Syliva Overman MD;  Method used: Electronically to [facility] [id number] [address], [place], [place], [place]  [address], Ph: [phone] or [phone], Fax: [phone]    Prescriptions: UNIVASC 7.5 MG TABS (MOEXIPRIL HCL) one and one half tabs by mouth qd  #45 x 3   Entered by:   Worthy Keeler LPN   Authorized by:   Syliva Overman MD   Signed by:   Worthy Keeler LPN on 38/18/2993   Method used:   Electronically to        [facility] [address] (retail)       [address]       [place], [place]  [address]       Ph: [phone] or [phone]       Fax: [phone]   RxID:   [id number]

## 2010-04-07 NOTE — Miscellaneous (Signed)
Summary: labs  Clinical Lists Changes  Orders: Added new Test order of T-Basic Metabolic Panel [phone]) - Signed Added new Test order of T-Lipid Profile [phone]) - Signed Added new Test order of T-Hepatic Function [phone]) - Signed

## 2010-04-07 NOTE — Letter (Signed)
Summary: External Correspondence  External Correspondence   Imported By: Rudene Anda 08/16/2008 14:31:38  _____________________________________________________________________  External Attachment:    Type:   Image     Comment:   External Document

## 2010-04-07 NOTE — Progress Notes (Signed)
Summary: bad stomach pains  Phone Note Call from Patient   Summary of Call: pt having really bad stomach pains. has alot of gas. feels bloated. wants to see what she needs to do. [phone] Initial call taken by: Rudene Anda,  February 05, 2009 8:56 AM  Follow-up for Phone Call        denies nausea, vomitting, diarrhea - has had regular bowel movements  reports alot of gas and bloating and very uncomfortable abdominal pains  CRAMPS   SHE CAN NOT STAND UP STRAIGHT SHE IS BENDING OVER  SHE HAS TOOK OVER THE COUNTER MEDICINE IS NOT HELPING HER Follow-up by: Worthy Keeler LPN,  February 05, 2009 9:39 AM  Additional Follow-up for Phone Call Additional follow up Details #1::        needs ed eval, pls let her know Additional Follow-up by: Syliva Overman MD,  February 05, 2009 10:29 AM    Additional Follow-up for Phone Call Additional follow up Details #2::    pt now would like to get reflux meds. and see if it will help Follow-up by: Rudene Anda,  February 05, 2009 10:32 AM  Additional Follow-up for Phone Call Additional follow up Details #3:: Details for Additional Follow-up Action Taken: returned call, no answer Additional Follow-up by: Worthy Keeler LPN,  February 05, 2009 10:38 AM  patient aware

## 2010-04-07 NOTE — Assessment & Plan Note (Signed)
Summary: NURSE VISIT  Nurse Visit   Vital Signs:  Patient profile:   60 year old female Menstrual status:  hysterectomy Weight:      130 pounds O2 Sat:      96 % Pulse rate:   78 / minute Resp:     16 per minute BP sitting:   122 / 82  (left arm) Cuff size:   regular  Vitals Entered By: Everitt Amber (October 11, 2008 10:56 AM) CC: Feels funny, sweating excessively. Been going on since yesterday. All day and night she is justdripping sweat. Vitals are good. Just doesn't know what could be causing this Comments Gave lab sheet and stool cards to be returned next week   Allergies: 1)  ! * Onglyza  Orders Added: 1)  T-CBC No Diff [[id number]] 2)  T-TSH [[id number]] 3)  No Charge Patient Arrived (NCPA0) [NCPA0]

## 2010-04-07 NOTE — Miscellaneous (Signed)
Summary: Refill  Clinical Lists Changes  Medications: Rx of GLYBURIDE-METFORMIN 5-500 MG  TABS (GLYBURIDE-METFORMIN) two tabs by mouth two times a day;  #120 x 2;  Signed;  Entered by: Everitt Amber;  Authorized by: Syliva Overman MD;  Method used: Electronically to [facility] [address] [place], [place], [place]  [address], Ph: [phone] or [phone], Fax: [phone]    Prescriptions: GLYBURIDE-METFORMIN 5-500 MG  TABS (GLYBURIDE-METFORMIN) two tabs by mouth two times a day  #120 x 2   Entered by:   Everitt Amber   Authorized by:   Syliva Overman MD   Signed by:   Everitt Amber on 01/15/2008   Method used:   Electronically to        [facility] [address] (retail)       [address]       [place], [place]  [address]       Ph: [phone] or [phone]       Fax: [phone]   RxID:   [id number]

## 2010-04-07 NOTE — Letter (Signed)
Summary: Historic Patient File  Historic Patient File   Imported By: Lind Guest 01/11/2008 16:09:52  _____________________________________________________________________  External Attachment:    Type:   Image     Comment:   External Document

## 2010-04-07 NOTE — Miscellaneous (Signed)
Summary: refill  Clinical Lists Changes  Medications: Rx of GLYBURIDE-METFORMIN 5-500 MG  TABS (GLYBURIDE-METFORMIN) two tabs by mouth two times a day;  #360 x 0;  Signed;  Entered by: Worthy Keeler LPN;  Authorized by: Syliva Overman MD;  Method used: Electronically to [facility] [address] [address] [place], [place], [place]  [address], Ph: [phone] or [phone], Fax: [phone]    Prescriptions: GLYBURIDE-METFORMIN 5-500 MG  TABS (GLYBURIDE-METFORMIN) two tabs by mouth two times a day  #360 x 0   Entered by:   Worthy Keeler LPN   Authorized by:   Syliva Overman MD   Signed by:   Worthy Keeler LPN on 84/13/2440   Method used:   Electronically to        [facility] [address] (retail)       [address]       [place], [place]  [address]       Ph: [phone] or [phone]       Fax: [phone]   RxID:   [id number]

## 2010-04-07 NOTE — Assessment & Plan Note (Signed)
Summary: OV   Vital Signs:  Patient profile:   60 year old female Menstrual status:  hysterectomy Height:      65 inches Weight:      138.25 pounds BMI:     23.09 O2 Sat:      96 % Pulse rate:   94 / minute Pulse rhythm:   regular Resp:     16 per minute BP sitting:   124 / 82  (left arm) Cuff size:   regular  Vitals Entered By: Everitt Amber LPN (October 24, 2009 8:17 AM) CC: Follow up chronic problems   CC:  Follow up chronic problems.  History of Present Illness: Reports  that she has been doing well. she has not been testing her blood sugars, and reports that occasionlly she has  Denies recent fever or chills. Denies sinus pressure, nasal congestion , ear pain or sore throat. Denies chest congestion, or cough productive of sputum. Denies chest pain, palpitations, PND, orthopnea or leg swelling. Denies abdominal pain, nausea, vomitting, diarrhea or constipation. Denies change in bowel movements or bloody stool. Denies dysuria , frequency, incontinence or hesitancy. Denies  joint pain, swelling, or reduced mobility. Denies headaches, vertigo, seizures. Denies depression, anxiety or insomnia. Denies  rash, lesions, or itch.     Current Medications (verified): 1)  Glyburide-Metformin 5-500 Mg  Tabs (Glyburide-Metformin) .... Two Tabs By Mouth Two Times A Day 2)  Moexipril-Hydrochlorothiazide 7.5-12.5 Mg Tabs (Moexipril-Hydrochlorothiazide) .... Take One and One Half Tabs Once Daily 3)  Onglyza 5 Mg Tabs (Saxagliptin Hcl) .... Take 1 Tablet By Mouth Once A Day  Allergies (verified): 1)  ! * Onglyza  Review of Systems      See HPI General:  Complains of fatigue. Eyes:  Denies discharge, eye pain, and red eye. CV:  Complains of fatigue and palpitations; denies shortness of breath with exertion and swelling of feet; reports recentr episodes of excessive sweating and fatigue, be;lieves it is more related to low blood sugars though she does not test them, and her HBA1C  shows lack of control. Sister has CAD, she requests rept CV eval. Endo:  Denies excessive thirst and excessive urination; not testing regularly, reports episodes of apparent hypoglycemia.. Heme:  Denies abnormal bruising and bleeding. Allergy:  Denies hives or rash and itching eyes.  Physical Exam  General:  Well-developed,well-nourished,in no acute distress; alert,appropriate and cooperative throughout examination HEENT: No facial asymmetry,  EOMI, No sinus tenderness, TM's Clear, oropharynx  pink and moist.   Chest: Clear to auscultation bilaterally.  CVS: S1, S2, No murmurs, No S3.   Abd: Soft, Nontender. area of tenderness corresponds to lower sternum MS: Adequate ROM spine, hips, shoulders and knees. tender left heel Ext: No edema.   CNS: CN 2-12 intact, power tone and sensation normal throughout.   Skin: Intact, no visible lesions or rashes.  Psych: Good eye contact, normal affect.  Memory intact, not anxious or depressed appearing.   Diabetes Management Exam:    Foot Exam (with socks and/or shoes not present):       Sensory-Monofilament:          Left foot: normal          Right foot: normal       Inspection:          Left foot: normal          Right foot: normal       Nails:          Left foot: normal  Right foot: normal   Impression & Recommendations:  Problem # 1:  FATIGUE (ICD-780.79) Assessment Deteriorated  Orders: T-CBC w/Diff ([id number]) T-TSH (28413-24401) Cardiology Referral (Cardiology)  Problem # 2:  PALPITATIONS (ICD-785.1) Assessment: Comment Only  Orders: Cardiology Referral (Cardiology), pt with personal and family h/o for increased cardiovascular risk  Problem # 3:  DIABETES MELLITUS, TYPE II (ICD-250.00) Assessment: Deteriorated  The following medications were removed from the medication list:    Onglyza 5 Mg Tabs (Saxagliptin hcl) .Marland Kitchen... Take 1 tablet by mouth once a day Her updated medication list for this problem includes:     Glyburide-metformin 5-500 Mg Tabs (Glyburide-metformin) .Marland Kitchen..Marland Kitchen Two tabs by mouth two times a day    Moexipril-hydrochlorothiazide 7.5-12.5 Mg Tabs (Moexipril-hydrochlorothiazide) .Marland Kitchen... Take one and one half tabs once daily    Aspir-low 81 Mg Tbec (Aspirin) .Marland Kitchen... Take 1 tablet by mouth once a day  Orders: T- Hemoglobin A1C (02725-36644)  Labs Reviewed: Creat: 0.62 (05/01/2009)    Reviewed HgBA1c results: 7.6 (10/22/2009)  7.4 (09/20/2008)  Problem # 4:  HYPERTENSION (ICD-401.9) Assessment: Improved  Her updated medication list for this problem includes:    Moexipril-hydrochlorothiazide 7.5-12.5 Mg Tabs (Moexipril-hydrochlorothiazide) .Marland Kitchen... Take one and one half tabs once daily  BP today: 124/82 Prior BP: 140/82 (05/02/2009)  Labs Reviewed: K+: 4.6 (05/01/2009) Creat: : 0.62 (05/01/2009)   Chol: 246 (10/22/2009)   HDL: 84 (10/22/2009)   LDL: 148 (10/22/2009)   TG: 70 (10/22/2009)  Problem # 5:  HYPERLIPIDEMIA (ICD-272.4) Assessment: Deteriorated  The following medications were removed from the medication list:    Mevacor 20 Mg Tabs (Lovastatin) .Marland Kitchen... Take 1 tab by mouth at bedtime Her updated medication list for this problem includes:    Lovastatin 40 Mg Tabs (Lovastatin) .Marland Kitchen... Take 1 tab by mouth at bedtime, p;t has been non compliant in the recent past, she understands the impt of med adherence  Orders: T-Hepatic Function [id number]) T-Lipid Profile [phone])  Labs Reviewed: SGOT: 13 (05/01/2009)   SGPT: 12 (05/01/2009)   HDL:84 (10/22/2009), 99 (05/01/2009)  LDL:148 (10/22/2009), 120 (05/01/2009)  Chol:246 (10/22/2009), 230 (05/01/2009)  Trig:70 (10/22/2009), 57 (05/01/2009)  Complete Medication List: 1)  Glyburide-metformin 5-500 Mg Tabs (Glyburide-metformin) .... Two tabs by mouth two times a day 2)  Moexipril-hydrochlorothiazide 7.5-12.5 Mg Tabs (Moexipril-hydrochlorothiazide) .... Take one and one half tabs once daily 3)  Lovastatin 40 Mg Tabs (Lovastatin) ....  Take 1 tab by mouth at bedtime 4)  Aspir-low 81 Mg Tbec (Aspirin) .... Take 1 tablet by mouth once a day  Other Orders: Radiology Referral (Radiology)  Patient Instructions: 1)  Please schedule a follow-up appointment in 3.5 months. 2)  It is important that you exercise regularly at least 20 minutes 5 times a week. If you develop chest pain, have severe difficulty breathing, or feel very tired , stop exercising immediately and seek medical attention. 3)  Hepatic Panel prior to visit, ICD-9: 4)  Lipid Panel prior to visit, ICD-9: 5)  TSH prior to visit, ICD-9:  fasting in 3.5 months 6)  CBC w/ Diff prior to visit, ICD-9: 7)  HbgA1C prior to visit, ICD-9: 8)  it is vital you test every day once daily at different times and call back if you are out of range 9)  Schedule your mammogram. Prescriptions: ASPIR-LOW 81 MG TBEC (ASPIRIN) Take 1 tablet by mouth once a day  #30 x 3   Entered and Authorized by:   Syliva Overman MD   Signed by:   Syliva Overman MD on 10/24/2009  Method used:   [facility]        [facility] [phone]* (retail)       [address]       [place], [place]  [address]       Ph: [phone] or [phone]       Fax: [phone]   RxID:   [id number] LOVASTATIN 40 MG TABS (LOVASTATIN) Take 1 tab by mouth at bedtime  #30 x 3   Entered and Authorized by:   Syliva Overman MD   Signed by:   Syliva Overman MD on 10/24/2009   Method used:   Electronically to        [facility]. [phone]* (retail)       [address]       [place], [place]  [address]       Ph: [phone] or [phone]       Fax: [phone]   RxID:   [id number]

## 2010-04-07 NOTE — Miscellaneous (Signed)
Summary: Refill  Clinical Lists Changes  Medications: Changed medication from GLYBURIDE-METFORMIN 5-500 MG  TABS (GLYBURIDE-METFORMIN) two tabs by mouth two times a day to GLYBURIDE-METFORMIN 5-500 MG  TABS (GLYBURIDE-METFORMIN) two tabs by mouth two times a day - Signed Rx of GLYBURIDE-METFORMIN 5-500 MG  TABS (GLYBURIDE-METFORMIN) two tabs by mouth two times a day;  #360 x 1;  Signed;  Entered by: Everitt Amber;  Authorized by: Syliva Overman MD;  Method used: Handwritten    Prescriptions: GLYBURIDE-METFORMIN 5-500 MG  TABS (GLYBURIDE-METFORMIN) two tabs by mouth two times a day  #360 x 1   Entered by:   Everitt Amber   Authorized by:   Syliva Overman MD   Signed by:   Everitt Amber on 08/20/2008   Method used:   Handwritten   RxID:   [id number]

## 2010-04-07 NOTE — Progress Notes (Signed)
Summary: rx   Phone Note Call from Patient   Summary of Call: [facility] a [phone]  Initial call taken by: Rudene Anda,  August 16, 2008 3:48 PM  Follow-up for Phone Call        Rx Called In Follow-up by: Worthy Keeler LPN,  August 16, 2008 4:33 PM      Prescriptions: MOEXIPRIL-HYDROCHLOROTHIAZIDE 7.5-12.5 MG TABS (MOEXIPRIL-HYDROCHLOROTHIAZIDE) Take one and one half tabs once daily  #45 x 2   Entered by:   Worthy Keeler LPN   Authorized by:   Syliva Overman MD   Signed by:   Worthy Keeler LPN on 29/51/8841   Method used:   Electronically to        [facility]. [phone]* (retail)       [address]       [place], [place]  [address]       Ph: [phone] or [phone]       Fax: [phone]   RxID:   [id number]

## 2010-04-07 NOTE — Miscellaneous (Signed)
Summary: AVANDIA SAMPLES  Clinical Lists Changes  Medications: Added new medication of AVANDIA 8 MG TABS (ROSIGLITAZONE MALEATE) ONE TAB by mouth QD - Signed Rx of AVANDIA 8 MG TABS (ROSIGLITAZONE MALEATE) ONE TAB by mouth QD;  #84 x 0;  Signed;  Entered by: Worthy Keeler LPN;  Authorized by: Syliva Overman MD;  Method used: Samples Given    Prescriptions: AVANDIA 8 MG TABS (ROSIGLITAZONE MALEATE) ONE TAB by mouth QD  #84 x 0   Entered by:   Worthy Keeler LPN   Authorized by:   Syliva Overman MD   Signed by:   Worthy Keeler LPN on 16/12/9602   Method used:   Samples Given   RxID:   [id number]

## 2010-04-07 NOTE — Assessment & Plan Note (Signed)
Summary: office visit   Vital Signs:  Patient Profile:   60 Years Old Female Height:     65 inches Weight:      140 pounds BMI:     23.38 O2 Sat:      97 % Pulse rate:   72 / minute Resp:     14 per minute BP sitting:   120 / 76  (left arm)  Pt. in pain?   no  Vitals Entered By: Everitt Amber (January 04, 2008 8:39 AM)                 Misunderstanding , pt does not need to be seen today, visit cancelled  Chief Complaint:  Follow up and still having some knee stiffness.    Updated Prior Medication List: GLYBURIDE-METFORMIN 5-500 MG  TABS (GLYBURIDE-METFORMIN) two tabs by mouth two times a day MEVACOR 20 MG TABS (LOVASTATIN) Take 1 tab by mouth at bedtime MOEXIPRIL-HYDROCHLOROTHIAZIDE 7.5-12.5 MG TABS (MOEXIPRIL-HYDROCHLOROTHIAZIDE) Take one and one half tabs once daily AVANDIA 8 MG TABS (ROSIGLITAZONE MALEATE) ONE TAB by mouth QD  Current Allergies: No known allergies         Complete Medication List: 1)  Glyburide-metformin 5-500 Mg Tabs (Glyburide-metformin) .... Two tabs by mouth two times a day 2)  Mevacor 20 Mg Tabs (Lovastatin) .... Take 1 tab by mouth at bedtime 3)  Moexipril-hydrochlorothiazide 7.5-12.5 Mg Tabs (Moexipril-hydrochlorothiazide) .... Take one and one half tabs once daily 4)  Avandia 8 Mg Tabs (Rosiglitazone maleate) .... One tab by mouth qd    ]The ov is not warranted today it is being cancelled she needs a 3 month fuv prescriptions hand written were given

## 2010-04-07 NOTE — Miscellaneous (Signed)
Summary: refill  Clinical Lists Changes  Medications: Rx of GLYBURIDE-METFORMIN 5-500 MG  TABS (GLYBURIDE-METFORMIN) two tabs by mouth two times a day;  #360 x 5;  Signed;  Entered by: Everitt Amber;  Authorized by: Syliva Overman MD;  Method used: [facility] to [facility]. [phone]*, [address], [place], [place], [place]  [address], Ph: [phone] or [phone], Fax: [phone] Rx of MEVACOR 20 MG TABS (LOVASTATIN) Take 1 tab by mouth at bedtime;  #90 x 5;  Signed;  Entered by: Everitt Amber;  Authorized by: Syliva Overman MD;  Method used: Electronically to [facility]. [phone]*, [address] [place], [place], [place]  [address], Ph: [phone] or [phone], Fax: [phone] Rx of MOEXIPRIL-HYDROCHLOROTHIAZIDE 7.5-12.5 MG TABS (MOEXIPRIL-HYDROCHLOROTHIAZIDE) Take one and one half tabs once daily;  #135 x 5;  Signed;  Entered by: Everitt Amber;  Authorized by: Syliva Overman MD;  Method used: Electronically to [facility]. [phone]*, [address] [place], [place], [place]  [address], Ph: [phone] or [phone], Fax: [phone] Rx of ONGLYZA 5 MG TABS (SAXAGLIPTIN HCL) Take 1 tablet by mouth once a day;  #90 x 3;  Signed;  Entered by: Everitt Amber;  Authorized by: Syliva Overman MD;  Method used: Electronically to [facility]. [phone]*, [address], [place], [place], [place]  [address], Ph: [phone] or [phone], Fax: [phone] Rx of METANX 2.8-25-2 MG TABS (L-METHYLFOLATE-B6-B12) Take 1 tablet by mouth once a day;  #90 x 4;  Signed;  Entered by: Everitt Amber;  Authorized by: Syliva Overman MD;  Method used: Electronically to [facility]. [phone]*, [address] [place], [place], [place]  [address], Ph: [phone] or [phone], Fax: [phone]    Prescriptions: METANX 2.8-25-2 MG TABS (L-METHYLFOLATE-B6-B12) Take 1 tablet by mouth once a day  #90 x 4   Entered by:   Everitt Amber   Authorized by:   Syliva Overman MD   Signed by:   Everitt Amber on 08/21/2008   Method used:   [facility]ronically to        [facility]  [address] [phone]* (retail)       [address]       [place], [place]  [address]       Ph: [phone] or [phone]       Fax: [phone]   RxID:   [id number] ONGLYZA 5 MG TABS (SAXAGLIPTIN HCL) Take 1 tablet by mouth once a day  #90 x 3   Entered by:   Everitt Amber   Authorized by:   Syliva Overman MD   Signed by:   Everitt Amber on 08/21/2008   Method used:   [facility]ronically to        [facility]  [address] [phone]* (retail)       [address]       [place], [place]  [address]       Ph: [phone] or [phone]       Fax: [phone]   RxID:   [id number] MOEXIPRIL-HYDROCHLOROTHIAZIDE 7.5-12.5 MG TABS (MOEXIPRIL-HYDROCHLOROTHIAZIDE) Take one and one half tabs once daily  #135 x 5   Entered by:   Everitt Amber   Authorized by:   Syliva Overman MD   Signed by:   Everitt Amber on 08/21/2008   Method used:   Electronically to        [facility]  [phone]* (retail)  [address]       [place], [place]  [address]       Ph: [phone] or [phone]       Fax: [phone]   RxID:   [id number] MEVACOR 20 MG TABS (LOVASTATIN) Take 1 tab by mouth at bedtime  #90 x 5   Entered by:   Everitt Amber   Authorized by:   Syliva Overman MD   Signed by:   Everitt Amber on 08/21/2008   Method used:   Electronically to        [facility]  [address] [phone]* (retail)       [address]       [place], [place]  [address]       Ph: [phone] or [phone]       Fax: [phone]   RxID:   [id number] GLYBURIDE-METFORMIN 5-500 MG  TABS (GLYBURIDE-METFORMIN) two tabs by mouth two times a day  #360 x 5   Entered by:   Everitt Amber   Authorized by:   Syliva Overman MD   Signed by:   Everitt Amber on 08/21/2008   Method used:   Electronically to        [facility] [phone]* (retail)       [address]       [place], [place]  [address]       Ph: [phone] or  [phone]       Fax: [phone]   RxID:   [id number]

## 2010-04-07 NOTE — Progress Notes (Signed)
Summary: CHEST COLD  Phone Note Call from Patient   Summary of Call: IN HERE YESTERDAY AND HAS A CHEST COLD NOW WHAT DO YOU RECOMMEND PLEASE CALL BACK AT [phone] Initial call taken by: Lind Guest,  June 14, 2008 9:50 AM  Follow-up for Phone Call        Feels like her throat is clogged up and scratchy and nasal congestion. no cough Recommended Mucinex or if she felt it was allergy related she could try otc claritin or zyrtec. Anything else you want to give? Sharl Ma Follow-up by: Everitt Amber,  June 14, 2008 11:12 AM  Additional Follow-up for Phone Call Additional follow up Details #1::        THUIS IS FINE , THANKS Additional Follow-up by: Syliva Overman MD,  June 14, 2008 12:05 PM

## 2010-04-07 NOTE — Assessment & Plan Note (Signed)
Summary: OV   Vital Signs:  Patient profile:   60 year old female Menstrual status:  hysterectomy Height:      65 inches Weight:      136.25 pounds BMI:     22.76 O2 Sat:      98 % Pulse rate:   78 / minute Pulse rhythm:   regular Resp:     16 per minute BP sitting:   140 / 82 Cuff size:   regular  Vitals Entered By: Everitt Amber LPN (May 02, 2009 9:34 AM) CC: Follow up chronic problems   CC:  Follow up chronic problems.  History of Present Illness: Reports  that she has been  doing fairly  well. Denies recent fever or chills. Denies sinus pressure, nasal congestion , ear pain or sore throat. Denies chest congestion, or cough productive of sputum. Denies chest pain, palpitations, PND, orthopnea or leg swelling. reports central abd discomfort with pressure,denies nausea, vomitting, diarrhea or constipation. Denies change in bowel movements or bloody stool. Denies dysuria , frequency, incontinence or hesitancy.  Denies headaches, vertigo, seizures. Denies depression, anxiety or insomnia. Denies  rash, lesions, or itch.      Allergies: 1)  ! * Onglyza  Review of Systems      See HPI ENT:  Complains of nasal congestion; denies sinus pressure and sore throat. Resp:  Complains of cough; denies sputum productive; 3 day h/o dry cough , nasal congestion at night . MS:  Complains of muscle aches; right arm pain inc in thw past3 mionths, she also has some right neck discomfort, also left  heel pain worse when she moves around the pain is less.. Neuro:  Denies headaches, seizures, and tingling. Endo:  Denies cold intolerance, excessive hunger, excessive thirst, excessive urination, heat intolerance, polyuria, and weight change. Heme:  Denies abnormal bruising and bleeding. Allergy:  Denies hives or rash and itching eyes.  Physical Exam  General:  Well-developed,well-nourished,in no acute distress; alert,appropriate and cooperative throughout examination HEENT: No  facial asymmetry,  EOMI, No sinus tenderness, TM's Clear, oropharynx  pink and moist.   Chest: Clear to auscultation bilaterally.  CVS: S1, S2, No murmurs, No S3.   Abd: Soft, Nontender. area of tenderness corresponds to lower sternum MS: Adequate ROM spine, hips, shoulders and knees. tender left heel Ext: No edema.   CNS: CN 2-12 intact, power tone and sensation normal throughout.   Skin: Intact, no visible lesions or rashes.  Psych: Good eye contact, normal affect.  Memory intact, not anxious or depressed appearing.   Diabetes Management Exam:    Foot Exam (with socks and/or shoes not present):       Sensory-Monofilament:          Left foot: normal          Right foot: normal       Inspection:          Left foot: normal          Right foot: normal       Nails:          Left foot: normal          Right foot: normal   Impression & Recommendations:  Problem # 1:  DIABETES MELLITUS, TYPE II (ICD-250.00) Assessment Comment Only  The following medications were removed from the medication list:    Actos 30 Mg Tabs (Pioglitazone hcl) ..... One tab by mouth qd Her updated medication list for this problem includes:    Glyburide-metformin 5-500  Mg Tabs (Glyburide-metformin) .Marland Kitchen..Marland Kitchen Two tabs by mouth two times a day    Moexipril-hydrochlorothiazide 7.5-12.5 Mg Tabs (Moexipril-hydrochlorothiazide) .Marland Kitchen... Take one and one half tabs once daily    Onglyza 5 Mg Tabs (Saxagliptin hcl) .Marland Kitchen... Take 1 tablet by mouth once a day  Orders: Glucose, (CBG) (8[address]) T- Hemoglobin A1C ([address]) T-Urine Microalbumin w/creat. ratio [phone])  Labs Reviewed: Creat: 0.57 (06/11/2008)    Reviewed HgBA1c results: 7.4 (09/20/2008)  7.2 (03/14/2008)  Problem # 2:  HYPERLIPIDEMIA (ICD-272.4) Assessment: Unchanged  Her updated medication list for this problem includes:    Mevacor 20 Mg Tabs (Lovastatin) .Marland Kitchen... Take 1 tab by mouth at bedtime, non compliant with medication  Orders: T-Lipid  Profile ([id number])  Labs Reviewed: SGOT: 13 (09/20/2008)   SGPT: 13 (09/20/2008)   HDL:79 (09/20/2008), 84 (06/11/2008)  LDL:136 (09/20/2008), 125 (06/11/2008)  Chol:229 (09/20/2008), 221 (06/11/2008)  Trig:69 (09/20/2008), 59 (06/11/2008)  Problem # 3:  HYPERTENSION (ICD-401.9) Assessment: Deteriorated  Her updated medication list for this problem includes:    Moexipril-hydrochlorothiazide 7.5-12.5 Mg Tabs (Moexipril-hydrochlorothiazide) .Marland Kitchen... Take one and one half tabs once daily  BP today: 140/82, has not taken med today Prior BP: 122/82 (10/11/2008)  Labs Reviewed: K+: 4.1 (06/11/2008) Creat: : 0.57 (06/11/2008)   Chol: 229 (09/20/2008)   HDL: 79 (09/20/2008)   LDL: 136 (09/20/2008)   TG: 69 (09/20/2008)  Complete Medication List: 1)  Glyburide-metformin 5-500 Mg Tabs (Glyburide-metformin) .... Two tabs by mouth two times a day 2)  Mevacor 20 Mg Tabs (Lovastatin) .... Take 1 tab by mouth at bedtime 3)  Moexipril-hydrochlorothiazide 7.5-12.5 Mg Tabs (Moexipril-hydrochlorothiazide) .... Take one and one half tabs once daily 4)  Onglyza 5 Mg Tabs (Saxagliptin hcl) .... Take 1 tablet by mouth once a day 5)  Metanx 2.8-25-2 Mg Tabs (L-methylfolate-b6-b12) .... Take 1 tablet by mouth once a day 6)  Flexeril 10 Mg Tabs (Cyclobenzaprine hcl) .... One tab by mouth qhs  Other Orders: Radiology Referral (Radiology)  Patient Instructions: 1)  Please schedule a follow-up appointment in 4 months. 2)  It is important that you exercise regularly at least 20 minutes 5 times a week. If you develop chest pain, have severe difficulty breathing, or feel very tired , stop exercising immediately and seek medical attention. 3)  HbgA1C prior to visit, ICD-9: in 1 to 2 weeks. 4)  Thse cough is likely due to allergy symptoms, one sudafed daily as needed will reduce the drainage also benadryl mwy be used. 5)  The pain in  the foot sounds like classic heel spur pain. 6)  Urine Microalbumin prior to  visit, ICD-9:  mid April 7)  Lipid Panel prior to visit, ICD-9: 8)  Your cholesterol is too high I recommend you take your meds Prescriptions: GLYBURIDE-METFORMIN 5-500 MG  TABS (GLYBURIDE-METFORMIN) two tabs by mouth two times a day  #360 x 6   Entered by:   Everitt Amber LPN   Authorized by:   Syliva Overman MD   Signed by:   Everitt Amber LPN on 69/62/9528   Method used:   [facility]ronically to        [facility] [phone]* (retail)       [address]       [place], [place]  [address]       Ph: [phone] or [phone]       Fax: [phone]   RxID:   [id number]   Laboratory Results   Blood Tests  Glucose (fasting): 129 mg/dL   (Normal Range: 84-696)

## 2010-04-07 NOTE — Medication Information (Signed)
Summary: Tax adviser   Imported By: Lind Guest 01/04/2008 11:02:01  _____________________________________________________________________  External Attachment:    Type:   Image     Comment:   External Document

## 2010-04-07 NOTE — Miscellaneous (Signed)
Summary: REFILL  Clinical Lists Changes  Medications: Rx of GLYBURIDE-METFORMIN 5-500 MG  TABS (GLYBURIDE-METFORMIN) two tabs by mouth two times a day;  #120 x 0;  Signed;  Entered by: Worthy Keeler LPN;  Authorized by: Syliva Overman MD;  Method used: Electronically to [id number] [address], [place], [place], [place]  [address], Ph: [phone] or [phone], Fax: [phone]    Prescriptions: GLYBURIDE-METFORMIN 5-500 MG  TABS (GLYBURIDE-METFORMIN) two tabs by mouth two times a day  #120 x 0   Entered by:   Worthy Keeler LPN   Authorized by:   Syliva Overman MD   Signed by:   Worthy Keeler LPN on 10/93/2355   Method used:   Electronically to        [facility] (retail)       [address]       [place], [place]  [address]       Ph: [phone] or [phone]       Fax: [phone]   RxID:   [id number]

## 2010-04-07 NOTE — Assessment & Plan Note (Signed)
Summary: office visit   Vital Signs:  Patient profile:   60 year old female Menstrual status:  hysterectomy Height:      65 inches Weight:      133.50 pounds BMI:     22.30 Pulse rate:   86 / minute Pulse rhythm:   regular Resp:     16 per minute BP sitting:   120 / 84  (left arm)  Vitals Entered By: Worthy Keeler LPN (September 20, 2008 10:51 AM) CC: follow-up visit Is Patient Diabetic? Yes  Pain Assessment Patient in pain? no        CC:  follow-up visit.  History of Present Illness: Numb right 4th finger since June 201, sometimes has lower ext numbness at night when she lies down, but not as frequent.  now working 12 hour shifts 3 days per week which is expected to increase, she is still adjusting her sleep as she works at night.  She has not been eating or exercising as she should and not testing as frequently as she should, she stopped onglyza, stating she was intolerant to the medication. She dneies any recent fever or chills. Her only stated concern regards numbness of the fingertip of only one digit.  Allergies (verified): 1)  ! * Onglyza  Review of Systems      See HPI General:  Complains of fatigue; denies chills and fever. Eyes:  Denies blurring and discharge. ENT:  Denies hoarseness, nasal congestion, sinus pressure, and sore throat. CV:  Denies chest pain or discomfort, palpitations, and swelling of feet. Resp:  Denies cough, sputum productive, and wheezing. GI:  Denies abdominal pain, constipation, diarrhea, nausea, and vomiting. GU:  Denies dysuria and urinary frequency. MS:  See HPI; Denies joint pain and stiffness. Endo:  Denies cold intolerance, excessive hunger, excessive thirst, excessive urination, heat intolerance, polyuria, and weight change.  Physical Exam  General:  alert, well-nourished, and well-hydrated. HEENT: No facial asymmetry,  EOMI, No sinus tenderness, TM's Clear, oropharynx  pink and moist.   Chest: Clear to auscultation bilaterally.   CVS: S1, S2, No murmurs, No S3.   Abd: Soft, Nontender.  MS: Adequate ROM spine, hips, shoulders and knees.  Ext: No edema.   CNS: CN 2-12 intact, power tone and sensation normal throughout.   Skin: Intact, no visible lesions or rashes.  Psych: Good eye contact, normal affect.  Memory intact, not anxious or depressed appearing.     Diabetes Management Exam:    Foot Exam (with socks and/or shoes not present):       Sensory-Monofilament:          Left foot: normal          Right foot: normal       Inspection:          Left foot: normal          Right foot: normal       Nails:          Left foot: normal          Right foot: normal   Impression & Recommendations:  Problem # 1:  DIABETES MELLITUS, TYPE II (ICD-250.00) Assessment Deteriorated  Her updated medication list for this problem includes:    Glyburide-metformin 5-500 Mg Tabs (Glyburide-metformin) .Marland Kitchen..Marland Kitchen Two tabs by mouth two times a day    Moexipril-hydrochlorothiazide 7.5-12.5 Mg Tabs (Moexipril-hydrochlorothiazide) .Marland Kitchen... Take one and one half tabs once daily    Onglyza 5 Mg Tabs (Saxagliptin hcl) .Marland Kitchen... Take 1 tablet by  mouth once a day    Actos 30 Mg Tabs (Pioglitazone hcl) ..... One tab by mouth qd  Orders: T- Hemoglobin A1C ([id number]) Glucose, (CBG) (09811)  Labs Reviewed: Creat: 0.57 (06/11/2008)    Reviewed HgBA1c results: 7.2 (03/14/2008)  8.0 (12/22/2007)  Problem # 2:  HYPERLIPIDEMIA (ICD-272.4) Assessment: Comment Only  Her updated medication list for this problem includes:    Mevacor 20 Mg Tabs (Lovastatin) .Marland Kitchen... Take 1 tab by mouth at bedtime  Orders: T-Hepatic Function [phone]) T-Lipid Profile [id number])  Labs Reviewed: SGOT: 18 (06/11/2008)   SGPT: 16 (06/11/2008)   HDL:84 (06/11/2008), 86 (03/12/2008)  LDL:125 (06/11/2008), 135 (03/12/2008)  Chol:221 (06/11/2008), 233 (03/12/2008)  Trig:59 (06/11/2008), 58 (03/12/2008)  Problem # 3:  HYPERTENSION (ICD-401.9) Assessment:  Unchanged  Her updated medication list for this problem includes:    Moexipril-hydrochlorothiazide 7.5-12.5 Mg Tabs (Moexipril-hydrochlorothiazide) .Marland Kitchen... Take one and one half tabs once daily  BP today: 120/84 Prior BP: 128/78 (06/13/2008)  Labs Reviewed: K+: 4.1 (06/11/2008) Creat: : 0.57 (06/11/2008)   Chol: 221 (06/11/2008)   HDL: 84 (06/11/2008)   LDL: 125 (06/11/2008)   TG: 59 (06/11/2008)  Complete Medication List: 1)  Glyburide-metformin 5-500 Mg Tabs (Glyburide-metformin) .... Two tabs by mouth two times a day 2)  Mevacor 20 Mg Tabs (Lovastatin) .... Take 1 tab by mouth at bedtime 3)  Moexipril-hydrochlorothiazide 7.5-12.5 Mg Tabs (Moexipril-hydrochlorothiazide) .... Take one and one half tabs once daily 4)  Onglyza 5 Mg Tabs (Saxagliptin hcl) .... Take 1 tablet by mouth once a day 5)  Metanx 2.8-25-2 Mg Tabs (L-methylfolate-b6-b12) .... Take 1 tablet by mouth once a day 6)  Actos 30 Mg Tabs (Pioglitazone hcl) .... One tab by mouth qd  Patient Instructions: 1)  Please schedule a follow-up appointment in 3.5 months. 2)  It is important that you exercise regularly at least 20 minutes 5 times a week. If you develop chest pain, have severe difficulty breathing, or feel very tired , stop exercising immediately and seek medical attention. 3)  You need to lose weight. Consider a lower calorie diet and regular exercise.  4)  Schedule your mammogram. 5)  See your eye doctor yearly to check for diabetic eye damage. Prescriptions: MOEXIPRIL-HYDROCHLOROTHIAZIDE 7.5-12.5 MG TABS (MOEXIPRIL-HYDROCHLOROTHIAZIDE) Take one and one half tabs once daily  #135 x 6   Entered by:   Everitt Amber   Authorized by:   Syliva Overman MD   Signed by:   Everitt Amber on 09/20/2008   Method used:   [facility] [address] [phone]* (retail)       [address]       [place], [place]  [address]       Ph: [phone] or [phone]       Fax: [phone]   RxID:    [id number] GLYBURIDE-METFORMIN 5-500 MG  TABS (GLYBURIDE-METFORMIN) two tabs by mouth two times a day  #360 x 6   Entered by:   Everitt Amber   Authorized by:   Syliva Overman MD   Signed by:   Everitt Amber on 09/20/2008   Method used:   [facility]ically to        [facility] [phone]* (retail)       [address]       [place], [place]  [address]       Ph: [phone] or [phone]  Fax: [phone]   RxID:   [id number]   Laboratory Results   Blood Tests   Date/Time Received: 09/20/08 10:56am Date/Time Reported: 09/20/08 10:56am  Glucose (fasting): 86 mg/dL   (Normal Range: 30-865)

## 2010-04-07 NOTE — Letter (Signed)
Summary: MEDICAL RECORDS  MEDICAL RECORDS   Imported By: Lind Guest 01/05/2008 10:27:27  _____________________________________________________________________  External Attachment:    Type:   Image     Comment:   External Document

## 2010-04-07 NOTE — Assessment & Plan Note (Signed)
Summary: FOLLOW UP/SLJ   Vital Signs:  Patient profile:   60 year old female Menstrual status:  hysterectomy Height:      65 inches Weight:      136 pounds BMI:     22.71 O2 Sat:      97 % on Room air Pulse rate:   85 / minute Pulse rhythm:   regular Resp:     16 per minute BP sitting:   138 / 86  (left arm)  Vitals Entered By: Adella Hare LPN (January 27, 2010 1:01 PM)  O2 Flow:  Room air CC: right shoulder/neck pain Is Patient Diabetic? Yes Did you bring your meter with you today? No   CC:  right shoulder/neck pain.  History of Present Illness: 1 week h/o intense  upper  back pain radiating to involve shoulder blade, right neck and right arm to elbow, rated as  burning 5. She has been doing alot of neck extension and upper ext movement in the home, painting and moving things, and she believes that this aggravate d her symptoms. Reports  that prior to this she had been doing fairly well. Denies recent fever or chills. Denies sinus pressure, nasal congestion , ear pain or sore throat. Denies chest congestion, or cough productive of sputum. Denies chest pain, palpitations, PND, orthopnea or leg swelling. Denies abdominal pain, nausea, vomitting, diarrhea or constipation. Denies change in bowel movements or bloody stool. Denies dysuria , frequency, incontinence or hesitancy.  Denies headaches, vertigo, seizures. Denies depression, anxiety or insomnia. Denies  rash, lesions, or itch.     Current Medications (verified): 1)  Glyburide-Metformin 5-500 Mg  Tabs (Glyburide-Metformin) .... Two Tabs By Mouth Two Times A Day 2)  Moexipril-Hydrochlorothiazide 7.5-12.5 Mg Tabs (Moexipril-Hydrochlorothiazide) .... Take One and One Half Tabs Once Daily 3)  Lovastatin 40 Mg Tabs (Lovastatin) .... Take 1 Tab By Mouth At Bedtime 4)  Aspir-Low 81 Mg Tbec (Aspirin) .... Take 1 Tablet By Mouth Once A Day  Allergies (verified): 1)  ! * Onglyza  Review of Systems General:  Complains  of fatigue. Eyes:  Denies discharge, eye pain, and red eye. MS:  Complains of mid back pain. Endo:  Denies excessive thirst and excessive urination. Heme:  Denies abnormal bruising, bleeding, enlarge lymph nodes, and fevers. Allergy:  Denies hives or rash and itching eyes.  Physical Exam  General:  Well-developed,well-nourished,in no acute distress; alert,appropriate and cooperative throughout examination HEENT: No facial asymmetry,  EOMI, No sinus tenderness, TM's Clear, oropharynx  pink and moist.   Chest: Clear to auscultation bilaterally.  CVS: S1, S2, No murmurs, No S3.   Abd: Soft, Nontender.  MS: Adequate ROM spine, hips, shoulders and knees.  Ext: No edema.   CNS: CN 2-12 intact, power tone and sensation normal throughout.   Skin: Intact, no visible lesions or rashes.  Psych: Good eye contact, normal affect.  Memory intact, not anxious or depressed appearing.   Diabetes Management Exam:    Foot Exam (with socks and/or shoes not present):       Sensory-Monofilament:          Left foot: normal          Right foot: normal       Inspection:          Left foot: normal          Right foot: normal       Nails:          Left foot: normal  Right foot: thickened   Impression & Recommendations:  Problem # 1:  BACK PAIN, THORACIC REGION, RIGHT (ICD-724.1) Assessment Comment Only  The following medications were removed from the medication list:    Tramadol Hcl 50 Mg Tabs (Tramadol hcl) .Marland Kitchen... Take 1 every 6 hrs as needed for pain Her updated medication list for this problem includes:    Aspir-low 81 Mg Tbec (Aspirin) .Marland Kitchen... Take 1 tablet by mouth once a day    Ibuprofen 800 Mg Tabs (Ibuprofen) .Marland Kitchen... Take 1 tablet by mouth three times a day  Orders: Depo- Medrol 80mg  (J1040) Ketorolac-Toradol 15mg  (C6237) Admin of Therapeutic Inj  intramuscular or subcutaneous (62831)  Problem # 2:  DIABETES MELLITUS, TYPE II (ICD-250.00) Assessment: Comment Only  Her updated  medication list for this problem includes:    Glyburide-metformin 5-500 Mg Tabs (Glyburide-metformin) .Marland Kitchen..Marland Kitchen Two tabs by mouth two times a day    Moexipril-hydrochlorothiazide 7.5-12.5 Mg Tabs (Moexipril-hydrochlorothiazide) .Marland Kitchen... Take one and one half tabs once daily     Aspir-low 81 Mg Tbec (Aspirin) .Marland Kitchen... Take 1 tablet by mouth once a day Patient advised to reduce carbs and sweets, commit to regular physical activity, take meds as prescribed, test blood sugars as directed, and attempt to lose weight , to improve blood sugar control.  Labs Reviewed: Creat: 0.62 (05/01/2009)    Reviewed HgBA1c results: 7.6 (10/22/2009)  7.4 (09/20/2008)  Problem # 3:  HYPERTENSION (ICD-401.9) Assessment: Unchanged  Her updated medication list for this problem includes:    Moexipril-hydrochlorothiazide 7.5-12.5 Mg Tabs (Moexipril-hydrochlorothiazide) .Marland Kitchen... Take one and one half tabs once daily  BP today: 138/86 Prior BP: 130/82 (10/29/2009)  Labs Reviewed: K+: 4.6 (05/01/2009) Creat: : 0.62 (05/01/2009)   Chol: 246 (10/22/2009)   HDL: 84 (10/22/2009)   LDL: 148 (10/22/2009)   TG: 70 (10/22/2009)  Problem # 4:  HYPERLIPIDEMIA (ICD-272.4) Assessment: Comment Only  Her updated medication list for this problem includes:    Lovastatin 40 Mg Tabs (Lovastatin) .Marland Kitchen... Take 1 tab by mouth at bedtime Low fat dietdiscussed and encouraged  Labs Reviewed: SGOT: 13 (05/01/2009)   SGPT: 12 (05/01/2009)   HDL:84 (10/22/2009), 99 (05/01/2009)  LDL:148 (10/22/2009), 120 (05/01/2009)  Chol:246 (10/22/2009), 230 (05/01/2009)  Trig:70 (10/22/2009), 57 (05/01/2009)  Complete Medication List: 1)  Glyburide-metformin 5-500 Mg Tabs (Glyburide-metformin) .... Two tabs by mouth two times a day 2)  Moexipril-hydrochlorothiazide 7.5-12.5 Mg Tabs (Moexipril-hydrochlorothiazide) .... Take one and one half tabs once daily 3)  Lovastatin 40 Mg Tabs (Lovastatin) .... Take 1 tab by mouth at bedtime 4)  Aspir-low 81 Mg Tbec  (Aspirin) .... Take 1 tablet by mouth once a day 5)  Ibuprofen 800 Mg Tabs (Ibuprofen) .... Take 1 tablet by mouth three times a day 6)  Gabapentin 100 Mg Caps (Gabapentin) .... 3 capsules at bedtime for pain, start with 1 and increase to 3 every 3 days if needed  Patient Instructions: 1)  f/U i 4 months. PLS cancel Nov 30 appt. 2)  Need labs asap, they are past due. 3)  you are being treated for pain due I think to arthritis in your spine , and possible bulging disc causing nerve pain. This often resolves with meds, however if no better in 3 weeks pls call. 4)  Injections today and meds sent to your pharmacy 5)  TDaP anfd flu vac past due Prescriptions: MOEXIPRIL-HYDROCHLOROTHIAZIDE 7.5-12.5 MG TABS (MOEXIPRIL-HYDROCHLOROTHIAZIDE) Take one and one half tabs once daily  #135 Tablet x 3   Entered by:   Adella Hare LPN  Authorized by:   Syliva Overman MD   Signed by:   Adella Hare LPN on 16/03/930   Method used:   [facility] to        [facility] [phone]* (retail)       [address]       [place], [place]  [address]       Ph: [phone] or [phone]       Fax: [phone]   RxID:   [id number] GABAPENTIN 100 MG CAPS (GABAPENTIN) 3 capsules at bedtime for pain, start with 1 and increase to 3 every 3 days if needed  #90 x 0   Entered and Authorized by:   Syliva Overman MD   Signed by:   Syliva Overman MD on 01/27/2010   Method used:   Electronically to        [facility] [phone]* (retail)       [address]       [place], [place]  [address]       Ph: [phone] or [phone]       Fax: [phone]   RxID:   [id number] PREDNISONE (PAK) 5 MG TABS (PREDNISONE) Use as directed  #21 x 0   Entered and Authorized by:   Syliva Overman MD   Signed by:   Syliva Overman MD on 01/27/2010   Method used:   Electronically to        [facility] [phone]* (retail)       [address]       [place], [place]   [address]       Ph: [phone] or [phone]       Fax: [phone]   RxID:   [id number] IBUPROFEN 800 MG TABS (IBUPROFEN) Take 1 tablet by mouth three times a day  #21 x 0   Entered and Authorized by:   Syliva Overman MD   Signed by:   Syliva Overman MD on 01/27/2010   Method used:   Electronically to        [facility] [phone]* (retail)       [address]       [place], [place]  [address]       Ph: [phone] or [phone]       Fax: [phone]   RxID:   [id number]    Medication Administration  Injection # 1:    Medication: Depo- Medrol 80mg     Diagnosis: BACK PAIN, THORACIC REGION, RIGHT (ICD-724.1)    Route: IM    Site: RUOQ gluteus    Exp Date: 06/12    Lot #: [id number]    Mfr: Pharmacia    Patient tolerated injection without complications    Given by: Adella Hare LPN (January 27, 2010 1:48 PM)  Injection # 2:    Medication: Ketorolac-Toradol 15mg     Diagnosis: BACK PAIN, THORACIC REGION, RIGHT (ICD-724.1)    Route: IM    Site: LUOQ gluteus    Exp Date: 01/07/2011    Lot #: [id number]    Mfr: [email or web address]    Comments: toradol 60mg  given    Patient tolerated injection without complications    Given by: Adella Hare LPN (January 27, 2010 1:49 PM)  Orders Added: 1)  Est. Patient Level IV [28366] 2)  Depo- Medrol 80mg  [J1040] 3)  Ketorolac-Toradol 15mg  [J1885] 4)  Admin of Therapeutic Inj  intramuscular or subcutaneous [96372]     Medication Administration  Injection # 1:    Medication: Depo- Medrol 80mg     Diagnosis: BACK PAIN, THORACIC REGION, RIGHT (ICD-724.1)    Route: IM    Site: RUOQ gluteus    Exp Date: 06/12    Lot #: [id number]    Mfr: Pharmacia    Patient tolerated injection without complications    Given by: Adella Hare LPN (January 27, 2010 1:48 PM)  Injection # 2:    Medication: Ketorolac-Toradol 15mg     Diagnosis: BACK PAIN, THORACIC REGION, RIGHT (ICD-724.1)    Route: IM    Site: LUOQ gluteus    Exp Date:  01/07/2011    Lot #: [id number]    Mfr: [email or web address]    Comments: toradol 60mg  given    Patient tolerated injection without complications    Given by: Adella Hare LPN (January 27, 2010 1:49 PM)  Orders Added: 1)  Est. Patient Level IV [45409] 2)  Depo- Medrol 80mg  [J1040] 3)  Ketorolac-Toradol 15mg  [J1885] 4)  Admin of Therapeutic Inj  intramuscular or subcutaneous [81191]

## 2010-04-07 NOTE — Letter (Signed)
Summary: Historic Patient File  Historic Patient File   Imported By: Lind Guest 01/05/2008 10:26:58  _____________________________________________________________________  External Attachment:    Type:   Image     Comment:   External Document

## 2010-04-07 NOTE — Assessment & Plan Note (Signed)
Summary: OFFICE VISIT   Vital Signs:  Patient Profile:   60 Years Old Female Height:     65 inches Weight:      138 pounds BMI:     23.05 O2 Sat:      98 % Pulse rate:   91 / minute Resp:     14 per minute  Pt. in pain?   yes    Location:   back    Intensity:   4    Type:       aching  Vitals Entered By: Everitt Amber (March 14, 2008 8:48 AM)                  Chief Complaint:  Follow up.  History of Present Illness: Stiffness in low back for 1 month wors in the morning twice when she bent over She had it radiate to the Right hip, on ocassion she has also had nimbness and tingling on thighs.She denies lower ext weakness, or incontinence of stool or urine. The pt has had no recent febr o chills. She denies depression or anxiety.  She denies symptoms of uncontrolled blood sugars, she has been testing her sugars regularly, and her avg fasting sugars are between 120 to 130.    Updated Prior Medication List: GLYBURIDE-METFORMIN 5-500 MG  TABS (GLYBURIDE-METFORMIN) two tabs by mouth two times a day MEVACOR 20 MG TABS (LOVASTATIN) Take 1 tab by mouth at bedtime MOEXIPRIL-HYDROCHLOROTHIAZIDE 7.5-12.5 MG TABS (MOEXIPRIL-HYDROCHLOROTHIAZIDE) Take one and one half tabs once daily AVANDIA 8 MG TABS (ROSIGLITAZONE MALEATE) ONE TAB by mouth QD  Current Allergies: No known allergies      Review of Systems  General      Denies chills and fever.  ENT      Denies earache, hoarseness, nasal congestion, sinus pressure, and sore throat.  CV      Denies chest pain or discomfort, palpitations, shortness of breath with exertion, and swelling of feet.  Resp      Denies cough, sputum productive, and wheezing.  GI      Denies abdominal pain, constipation, diarrhea, nausea, and vomiting.  GU      Denies dysuria and urinary frequency.  Derm      Denies lesion(s) and rash.   Physical Exam  General:     Well-developed,well-nourished,in no acute distress; alert,appropriate  and cooperative throughout examination Head:     Normocephalic and atraumatic without obvious abnormalities. No apparent alopecia or balding. Eyes:     vision grossly intact.   Ears:     External ear exam shows no significant lesions or deformities.  Otoscopic examination reveals clear canals, tympanic membranes are intact bilaterally without bulging, retraction, inflammation or discharge. Hearing is grossly normal bilaterally. Nose:     no external deformity and no nasal discharge.   Mouth:     Oral mucosa and oropharynx without lesions or exudates.  Teeth in good repair. Neck:     No deformities, masses, or tenderness noted. Lungs:     Normal respiratory effort, chest expands symmetrically. Lungs are clear to auscultation, no crackles or wheezes. Heart:     Normal rate and regular rhythm. S1 and S2 normal without gallop, murmur, click, rub or other extra sounds. Abdomen:     soft and non-tender.   Extremities:     No clubbing, cyanosis, edema, or deformity noted with normal full range of motion of all joints.   Neurologic:     alert & oriented X3, cranial nerves  II-XII intact, strength normal in all extremities, and sensation intact to light touch.   Skin:     Intact without suspicious lesions or rashes Cervical Nodes:     No lymphadenopathy noted Psych:     Cognition and judgment appear intact. Alert and cooperative with normal attention span and concentration. No apparent delusions, illusions, hallucinations    Impression & Recommendations:  Problem # 1:  DIABETES MELLITUS, TYPE I (ICD-250.01) Assessment: Improved  The following medications were removed from the medication list:    Univasc 7.5 Mg Tabs (Moexipril hcl) ..... One and one half tabs by mouth qd  Her updated medication list for this problem includes:    Glyburide-metformin 5-500 Mg Tabs (Glyburide-metformin) .Marland Kitchen..Marland Kitchen Two tabs by mouth two times a day    Moexipril-hydrochlorothiazide 7.5-12.5 Mg Tabs  (Moexipril-hydrochlorothiazide) .Marland Kitchen... Take one and one half tabs once daily    Avandia 8 Mg Tabs (Rosiglitazone maleate) ..... One tab by mouth qd  Orders: Glucose, (CBG) (82962) Hemoglobin A1C (83036) T-Urine Microalbumin w/creat. ratio [phone] / [phone])  Labs Reviewed: HgBA1c: 7.2 (03/14/2008)   Creat: 0.46 (03/12/2008)   Microalbumin: 0.54 (08/05/2006)   Problem # 2:  HYPERTENSION (ICD-401.9) Assessment: Improved  The following medications were removed from the medication list:    Univasc 7.5 Mg Tabs (Moexipril hcl) ..... One and one half tabs by mouth qd  Her updated medication list for this problem includes:    Moexipril-hydrochlorothiazide 7.5-12.5 Mg Tabs (Moexipril-hydrochlorothiazide) .Marland Kitchen... Take one and one half tabs once daily  Prior BP: 120/76 (01/04/2008)  Labs Reviewed: Creat: 0.46 (03/12/2008) Chol: 233 (03/12/2008)   HDL: 86 (03/12/2008)   LDL: 135 (03/12/2008)   TG: 58 (03/12/2008)   Problem # 3:  HYPERLIPIDEMIA (ICD-272.4) Assessment: Unchanged  Her updated medication list for this problem includes:    Mevacor 20 Mg Tabs (Lovastatin) .Marland Kitchen... Take 1 tab by mouth at bedtime Pt is non compliant with meds Orders: T-Hepatic Function [phone]) T-Lipid Profile [phone])  Labs Reviewed: Chol: 233 (03/12/2008)   HDL: 86 (03/12/2008)   LDL: 135 (03/12/2008)   TG: 58 (03/12/2008) SGOT: 14 (03/12/2008)   SGPT: 13 (03/12/2008)   Complete Medication List: 1)  Glyburide-metformin 5-500 Mg Tabs (Glyburide-metformin) .... Two tabs by mouth two times a day 2)  Mevacor 20 Mg Tabs (Lovastatin) .... Take 1 tab by mouth at bedtime 3)  Moexipril-hydrochlorothiazide 7.5-12.5 Mg Tabs (Moexipril-hydrochlorothiazide) .... Take one and one half tabs once daily 4)  Avandia 8 Mg Tabs (Rosiglitazone maleate) .... One tab by mouth qd   Patient Instructions: 1)  Please schedule a follow-up appointment in 3 months. 2)  Your Bp  and blood sugar are MUCH BETER, congrats. 3)   Pls start walking daily for at least 30 mins, this will help your back pain, also start back exercises.. I will send you for an Xray of your thorac lumbar spine after you have had the tests ordered by Dr. Rito Ehrlich, if you need it. 4)  You may use tylenol for pain. 5)  Pls reduce your fried and fatty foods and you also NEED to take the cholesterol med. 6)  GrEAT  plan to see the nutrtionist about your diet.   ] Laboratory Results   Blood Tests   Date/Time Received: March 14, 2008 9am Date/Time Reported: March 14, 2008 9am  Glucose (random): 104 mg/dL   (Normal Range: 32-355) HGBA1C: 7.2%   (Normal Range: Non-Diabetic - 3-6%   Control Diabetic - 6-8%)      Laboratory Results  Blood Tests     Glucose (random): 104 mg/dL   (Normal Range: 45-409) HGBA1C: 7.2%   (Normal Range: Non-Diabetic - 3-6%   Control Diabetic - 6-8%)

## 2010-04-07 NOTE — Assessment & Plan Note (Signed)
Summary: OV   Allergies: No Known Drug Allergies   Complete Medication List: 1)  Glyburide-metformin 5-500 Mg Tabs (Glyburide-metformin) .... Two tabs by mouth two times a day 2)  Mevacor 20 Mg Tabs (Lovastatin) .... Take 1 tab by mouth at bedtime 3)  Moexipril-hydrochlorothiazide 7.5-12.5 Mg Tabs (Moexipril-hydrochlorothiazide) .... Take one and one half tabs once daily 4)  Avandia 8 Mg Tabs (Rosiglitazone maleate) .... One tab by mouth qd  pt not evaluated in the office

## 2010-04-07 NOTE — Assessment & Plan Note (Signed)
Summary: OV   Vital Signs:  Patient Profile:   60 Years Old Female Height:     65 inches Weight:      142 pounds BMI:     23.72 O2 Sat:      96 % Pulse rate:   82 / minute Resp:     16 per minute BP sitting:   130 / 90  (left arm)  Pt. in pain?   no  Vitals Entered By: Everitt Amber (December 22, 2007 8:30 AM)                  Chief Complaint:  r knee instability with clicking and popping for abot 3 weeks .  History of Present Illness: The pt reperts left knee instability wih clicking and popping over the last 3 weeks.she does not wish to see ortho at this time but is aware of the risk of a fall if her symptoms persisit or worsen. The pt. reports inc stress from being unemployed. She is having difficulty affording her meds, and she has been actively seeking employment for several months now. she states that her blood sugars have remained elevated for several weeks, but she insists that she is being faithful in taking her meds.    Current Allergies: No known allergies      Review of Systems  General      Denies chills, fatigue, fever, loss of appetite, malaise, weakness, and weight loss.  ENT      Denies decreased hearing, difficulty swallowing, ear discharge, earache, hoarseness, nasal congestion, nosebleeds, postnasal drainage, ringing in ears, sinus pressure, and sore throat.  CV      Denies bluish discoloration of lips or nails, chest pain or discomfort, difficulty breathing at night, difficulty breathing while lying down, fainting, fatigue, leg cramps with exertion, lightheadness, near fainting, palpitations, shortness of breath with exertion, swelling of feet, swelling of hands, and weight gain.  Resp      Denies chest discomfort, chest pain with inspiration, cough, coughing up blood, excessive snoring, hypersomnolence, morning headaches, pleuritic, shortness of breath, sputum productive, and wheezing.  GI      Denies abdominal pain, change in bowel habits,  constipation, diarrhea, indigestion, nausea, and vomiting.  GU      Denies dysuria and urinary frequency.  MS      See HPI  Neuro      Denies headaches, poor balance, seizures, and sensation of room spinning.  Psych      Complains of anxiety.      Denies depression, suicidal thoughts/plans, thoughts of violence, and unusual visions or sounds.  Endo      Denies cold intolerance, excessive hunger, excessive thirst, excessive urination, heat intolerance, polyuria, and weight change.  Allergy      Denies hives or rash, persistent infections, and sneezing.   Physical Exam  General:     Well-developed,well-nourished,in no acute distress; alert,appropriate and cooperative throughout examination Head:     Normocephalic and atraumatic without obvious abnormalities. No apparent alopecia or balding. Eyes:     vision grossly intact.   Ears:     External ear exam shows no significant lesions or deformities.  Otoscopic examination reveals clear canals, tympanic membranes are intact bilaterally without bulging, retraction, inflammation or discharge. Hearing is grossly normal bilaterally. Nose:     no external deformity and no nasal discharge.   Mouth:     Oral mucosa and oropharynx without lesions or exudates.  Teeth in good repair. Neck:  No deformities, masses, or tenderness noted. Lungs:     Normal respiratory effort, chest expands symmetrically. Lungs are clear to auscultation, no crackles or wheezes. Heart:     Normal rate and regular rhythm. S1 and S2 normal without gallop, murmur, click, rub or other extra sounds. Abdomen:     soft and non-tender.   Extremities:     No clubbing, cyanosis, edema, or deformity noted with normal full range of motion of all joints.   Skin:     Intact without suspicious lesions or rashes Cervical Nodes:     No lymphadenopathy noted Psych:     Cognition and judgment appear intact. Alert and cooperative with normal attention span and  concentration. No apparent delusions, illusions, hallucinations  Diabetes Management Exam:    Foot Exam (with socks and/or shoes not present):       Sensory-Pinprick/Light touch:          Left medial foot (L-4): normal          Left dorsal foot (L-5): normal          Left lateral foot (S-1): normal          Right medial foot (L-4): normal          Right dorsal foot (L-5): normal          Right lateral foot (S-1): normal       Inspection:          Left foot: normal          Right foot: normal       Nails:          Left foot: normal          Right foot: normal    Eye Exam:       Eye Exam done elsewhere    Impression & Recommendations:  Problem # 1:  DIABETES MELLITUS, TYPE I (ICD-250.01) Assessment: Deteriorated  Her updated medication list for this problem includes:    Glyburide-metformin 5-500 Mg Tabs (Glyburide-metformin) .Marland Kitchen..Marland Kitchen Two tabs by mouth two times a day    Univasc 7.5 Mg Tabs (Moexipril hcl) ..... One tab by mouth once daily    Univasc 7.5 Mg Tabs (Moexipril hcl) ..... One and a half tablets daily  Orders: Glucose, (CBG) (82962) Hemoglobin A1C (52841)  Labs Reviewed: HgBA1c: 8.0 (12/22/2007)   Creat: 0.58 (12/21/2007)   Microalbumin: 0.54 (08/05/2006) Will add avandia 8mg  daily, pt does not wish to start insulin at this time, will attempt to ontain samples for her   Problem # 2:  HYPERTENSION (ICD-401.9) Assessment: Comment Only  Her updated medication list for this problem includes:    Univasc 7.5 Mg Tabs (Moexipril hcl) ..... One tab by mouth once daily    Univasc 7.5 Mg Tabs (Moexipril hcl) ..... One and a half tablets daily  BP today: 130/90  Labs Reviewed: Creat: 0.58 (12/21/2007) Chol: 222 (12/21/2007)   HDL: 75 (12/21/2007)   LDL: 131 (12/21/2007)   TG: 81 (12/21/2007)   Problem # 3:  HYPERLIPIDEMIA (ICD-272.4) Assessment: Comment Only  The following medications were removed from the medication list:    Crestor 10 Mg Tabs (Rosuvastatin  calcium) ..... One tab by mouth at bedtime  Her updated medication list for this problem includes:    Mevacor 20 Mg Tabs (Lovastatin) .Marland Kitchen... Take 1 tab by mouth at bedtime  Labs Reviewed: Chol: 222 (12/21/2007)   HDL: 75 (12/21/2007)   LDL: 131 (12/21/2007)   TG: 81 (12/21/2007) SGOT: 14 (12/21/2007)  SGPT: 17 (12/21/2007)   Complete Medication List: 1)  Glyburide-metformin 5-500 Mg Tabs (Glyburide-metformin) .... Two tabs by mouth two times a day 2)  Univasc 7.5 Mg Tabs (Moexipril hcl) .... One tab by mouth once daily 3)  Mevacor 20 Mg Tabs (Lovastatin) .... Take 1 tab by mouth at bedtime 4)  Univasc 7.5 Mg Tabs (Moexipril hcl) .... One and a half tablets daily   Patient Instructions: 1)  Your blood sugar is not well controlled, i will add avandia 8mg  1 daily to your meds,. 2)  The cholesterol is improved but needs to be better, the dose of lovastatin is increased to 20mg . 3)  The blood pressure med is inc to 1.5 tabs daily. 4)  Please schedule a follow-up appointment in 3 months.   Prescriptions: UNIVASC 7.5 MG TABS (MOEXIPRIL HCL) one and a half tablets daily  #45 x 4   Entered and Authorized by:   Syliva Overman MD   Signed by:   Syliva Overman MD on 12/22/2007   Method used:   Electronically to        [id number] (retail)       [address]       [place], [place]  [address]       Ph: [phone] or [phone]       Fax: [phone]   RxID:   [id number] MEVACOR 20 MG TABS (LOVASTATIN) Take 1 tab by mouth at bedtime  #30 x 5   Entered and Authorized by:   Syliva Overman MD   Signed by:   Syliva Overman MD on 12/22/2007   Method used:   Electronically to        [id number] (retail)       [address]       [place], [place]  [address]       Ph: [phone] or [phone]       Fax: [phone]   RxID:   [id number]  ]  Laboratory Results   Blood Tests   Date/Time  Received: December 22, 2007 9am Date/Time Reported: December 22, 2007 9am  Glucose (random): 148 mg/dL   (Normal Range: 57-322) HGBA1C: 8.0%   (Normal Range: Non-Diabetic - 3-6%   Control Diabetic - 6-8%)

## 2010-04-07 NOTE — Progress Notes (Signed)
Summary: LAB ORDER  Phone Note Call from Patient   Summary of Call: NEEDS LAB ORDER FAXED UPSTAIRS GOING TODAY Initial call taken by: Lind Guest,  March 12, 2008 9:15 AM  Follow-up for Phone Call        lab order sent Follow-up by: Worthy Keeler LPN,  March 12, 2008 9:28 AM

## 2010-04-07 NOTE — Progress Notes (Signed)
Summary: SHOOTING PAIN  Phone Note Call from Patient   Summary of Call: SHOOTING PAIN THROUGH HER LEG 3 INCHES FROM KNEE IN THIGH WHEN SHE IS LAYING DOWN OR SITTING UP CALL BACK AT [phone] OR CELL [phone] Initial call taken by: Lind Guest,  October 29, 2009 9:07 AM  Follow-up for Phone Call        please have patient schedule appt with dawn today Follow-up by: Adella Hare LPN,  October 29, 2009 9:27 AM  Additional Follow-up for Phone Call Additional follow up Details #1::        Appt Scheduled Today Additional Follow-up by: Lind Guest,  October 29, 2009 9:53 AM

## 2010-04-07 NOTE — Miscellaneous (Signed)
Summary: Refill  Clinical Lists Changes  Medications: Rx of UNIVASC 7.5 MG  TABS (MOEXIPRIL HCL) one tab by mouth once daily;  #45 x 5;  Signed;  Entered by: Everitt Amber;  Authorized by: Syliva Overman MD;  Method used: Electronically to [id number] [address] [place], [place], [place]  [address], Ph: [phone] or [phone], Fax: [phone]    Prescriptions: UNIVASC 7.5 MG  TABS (MOEXIPRIL HCL) one tab by mouth once daily  #45 x 5   Entered by:   Everitt Amber   Authorized by:   Syliva Overman MD   Signed by:   Everitt Amber on 01/02/2008   Method used:   Electronically to        [id number] (retail)       [address]       [place], [place]  [address]       Ph: [phone] or [phone]       Fax: [phone]   RxID:   [id number]

## 2010-04-07 NOTE — Progress Notes (Signed)
Summary: CATCH IN BACK  Phone Note Call from Patient   Summary of Call: BEND OVER YESTERDAY AND FELT LIKE A CATCH IN HER BACK AND WANTS TO KNOW  IF THERE IS SOMETHING SHE CAN DO  [facility] CALL BACK AT [phone] TO LET HER KNOW Initial call taken by: Lind Guest,  October 28, 2008 8:37 AM  Follow-up for Phone Call        advise and erx flexeril 10 mg 1 at bedtime as needed #30 no refills, also advise bend carefully, stoop, use tylenol 1 twice daily for the next 1 week Follow-up by: Syliva Overman MD,  October 28, 2008 5:01 PM  Additional Follow-up for Phone Call Additional follow up Details #1::        Phone Call Completed, Rx Called In Additional Follow-up by: Worthy Keeler LPN,  October 28, 2008 5:04 PM    New/Updated Medications: FLEXERIL 10 MG TABS (CYCLOBENZAPRINE HCL) one tab by mouth qhs Prescriptions: FLEXERIL 10 MG TABS (CYCLOBENZAPRINE HCL) one tab by mouth qhs  #30 x 0   Entered by:   Worthy Keeler LPN   Authorized by:   Syliva Overman MD   Signed by:   Worthy Keeler LPN on 16/12/9602   Method used:   Electronically to        [facility] [address] [phone][facility] (retail)       [address]       [place], [place]  [address]       Ph: [phone] or [phone]       Fax: [phone]   RxID:   [id number]

## 2010-04-07 NOTE — Progress Notes (Signed)
Summary: [name]  [facility]   Imported By: Lind Guest 05/22/2008 09:32:08  _____________________________________________________________________  External Attachment:    Type:   Image     Comment:   External Document

## 2010-04-07 NOTE — Letter (Signed)
Summary: order for outpatient nutritional care  order for outpatient nutritional care   Imported By: Lind Guest 10/24/2009 14:38:29  _____________________________________________________________________  External Attachment:    Type:   Image     Comment:   External Document

## 2010-04-09 NOTE — Letter (Signed)
Summary: LAB ADD ON  LAB ADD ON   Imported By: Lind Guest 03/25/2010 08:34:17  _____________________________________________________________________  External Attachment:    Type:   Image     Comment:   External Document

## 2010-04-09 NOTE — Assessment & Plan Note (Signed)
Summary: follow up/slj   Vital Signs:  Patient profile:   60 year old female Menstrual status:  hysterectomy Height:      65 inches Weight:      131 pounds BMI:     21.88 O2 Sat:      98 % Pulse rate:   106 / minute Pulse rhythm:   regular Resp:     16 per minute BP sitting:   108 / 70  (left arm) Cuff size:   regular  Vitals Entered By: Everitt Amber LPN (March 24, 2010 10:28 AM) CC: Follow up chronic problems   CC:  Follow up chronic problems.  History of Present Illness: pt reports possible exposure to gas leak at her new home over a course of approx 1 month, this was corrected in January 2012, has questions about potential harm, i have advised she will need a sttement reporting possible taoxins she was exposed to, and eval by a pulmonologist.She continues to experience significant right shoulder burning pain, radiaiting down the arm disturbing her sleep and limiting her ability to function.   Current Medications (verified): 1)  Glyburide-Metformin 5-500 Mg  Tabs (Glyburide-Metformin) .... Two Tabs By Mouth Two Times A Day 2)  Moexipril-Hydrochlorothiazide 7.5-12.5 Mg Tabs (Moexipril-Hydrochlorothiazide) .... Take One and One Half Tabs Once Daily 3)  Aspir-Low 81 Mg Tbec (Aspirin) .... Take 1 Tablet By Mouth Once A Day 4)  Ibuprofen 800 Mg Tabs (Ibuprofen) .... Take 1 Tablet By Mouth Three Times A Day 5)  Gabapentin 100 Mg Caps (Gabapentin) .... 3 Capsules At Bedtime For Pain, Start With 1 and Increase To 3 Every 3 Days If Needed  Allergies (verified): 1)  ! * Onglyza  Review of Systems      See HPI General:  Complains of fatigue and sleep disorder. Eyes:  Denies blurring and discharge. ENT:  Denies hoarseness and nasal congestion. CV:  Denies chest pain or discomfort, palpitations, and swelling of feet. Resp:  Denies cough and sputum productive. GI:  Denies abdominal pain, constipation, diarrhea, nausea, and vomiting. GU:  Denies dysuria and urinary frequency. MS:   Complains of joint pain and stiffness; 4 month h/o progressive neck and upper neck, upper bacn and post right shoulder painradaitin gto elbow in posterior aspect, squeezing the fist worsens the pain, varies between a 7 to 8, meds have not help. Derm:  Complains of lesion(s); denies itching and rash; recurrent sebaceouscyst. Neuro:  Complains of tingling and weakness; denies headaches and seizures; right upper ext. Psych:  Denies anxiety, depression, mental problems, suicidal thoughts/plans, thoughts of violence, and unusual visions or sounds. Endo:  Denies cold intolerance, excessive hunger, excessive thirst, and excessive urination; not testing consitently. Heme:  Denies abnormal bruising and bleeding. Allergy:  Denies hives or rash and itching eyes.  Physical Exam  General:  Well-developed,well-nourished,in no acute distress; alert,appropriate and cooperative throughout examination HEENT: No facial asymmetry,  EOMI, No sinus tenderness, TM's Clear, oropharynx  pink and moist.   Chest: Clear to auscultation bilaterally.  CVS: S1, S2, No murmurs, No S3.   Abd: Soft, Nontender.  MS: Adequate ROM spine, hips,  and knees. decreased ROM right shoulder and upper extremity Ext: No edema.   CNS: CN 2-12 intact, reducedpower and sensation rightarm  Skin: Intact,sebaceous cyst  Psych: Good eye contact, normal affect.  Memory intact, not anxious or depressed appearing.    Impression & Recommendations:  Problem # 1:  BACK PAIN, THORACIC REGION, RIGHT (ICD-724.1) Assessment Deteriorated  Her updated medication list  for this problem includes:    Aspir-low 81 Mg Tbec (Aspirin) .Marland Kitchen... Take 1 tablet by mouth once a day    Ibuprofen 800 Mg Tabs (Ibuprofen) .Marland Kitchen... Take 1 tablet by mouth three times a day  Orders: Radiology other (Radiology Other)  Problem # 2:  DIABETES MELLITUS, TYPE II (ICD-250.00) Assessment: Deteriorated  Labs Reviewed: Creat: 0.62 (05/01/2009)    Reviewed HgBA1c results:  7.8 (03/20/2010)  7.6 (10/22/2009) Patient advised to reduce carbs and sweets, commit to regular physical activity, take meds as prescribed, test blood sugars as directed, and attempt to lose weight , to improve blood sugar control.  Problem # 3:  HYPERTENSION (ICD-401.9) Assessment: Improved  Her updated medication list for this problem includes:    Moexipril-hydrochlorothiazide 7.5-12.5 Mg Tabs (Moexipril-hydrochlorothiazide) .Marland Kitchen... Take one and one half tabs once daily  Orders: CXR- 2view (CXR)  BP today: 108/70 Prior BP: 138/86 (01/27/2010)  Labs Reviewed: K+: 4.6 (05/01/2009) Creat: : 0.62 (05/01/2009)   Chol: 247 (03/20/2010)   HDL: 73 (03/20/2010)   LDL: 160 (03/20/2010)   TG: 69 (03/20/2010)  Problem # 4:  HYPERLIPIDEMIA (ICD-272.4) Assessment: Deteriorated  The following medications were removed from the medication list:    Lovastatin 40 Mg Tabs (Lovastatin) .Marland Kitchen... Take 1 tab by mouth at bedtime,pt non compliant with med, she is advised to change this  Orders: T-Lipid Profile [phone])  Labs Reviewed: SGOT: 12 (03/20/2010)   SGPT: 10 (03/20/2010)   HDL:73 (03/20/2010), 84 (10/22/2009)  LDL:160 (03/20/2010), 148 (10/22/2009)  Chol:247 (03/20/2010), 246 (10/22/2009)  Trig:69 (03/20/2010), 70 (10/22/2009)  Problem # 5:  SEBACEOUS CYST (ICD-706.2) Assessment: Comment Only  Orders: Dermatology Referral (Derma)  Complete Medication List: 1)  Glyburide-metformin 5-500 Mg Tabs (Glyburide-metformin) .... Two tabs by mouth two times a day 2)  Moexipril-hydrochlorothiazide 7.5-12.5 Mg Tabs (Moexipril-hydrochlorothiazide) .... Take one and one half tabs once daily 3)  Aspir-low 81 Mg Tbec (Aspirin) .... Take 1 tablet by mouth once a day 4)  Ibuprofen 800 Mg Tabs (Ibuprofen) .... Take 1 tablet by mouth three times a day  Other Orders: T- Hemoglobin A1C ([id number]) Neurosurgeon Referral (Neurosurgeon) Future Orders: Radiology Referral (Radiology) ...  03/26/2010  Patient Instructions: 1)  Please schedule a cPE in 3 months. 2)  you are being referred for imaging ofyour neck and upper back. 3)  pls stop gabapentin. 4)  Your sugar is out of control and is worse, you need to test once daily, and record,change eating and start regular daily exercise to improve this.  5)  You will get a 1600cal diet sheet, also, a referral to diettitian, and the [facility] low carb diet with explanation. 6)  We will provide a diabetic log 7)  your cholesterol  is extremely high, this markedly increases your risk of  heatr disease, pls chenge your diet. 8)  pls  leave any more info if you pursue this regarding potential exposure to toxic fumes in your homei will need to contact the [facility] if you do before i can adeuately address your concerns. 9)  cXR today 10)  pls call  and let us know when you want to see the dermatologist about the cyst, spk to referrals  11)  Lipid Panel prior to visit, ICD-9: 12)  HbgA1C prior to visit, ICD-9:   fasting in 3 months Prescriptions: MOEXIPRIL-HYDROCHLOROTHIAZIDE 7.5-12.5 MG TABS (MOEXIPRIL-HYDROCHLOROTHIAZIDE) Take one and one half tabs once daily  #135 Tablet x 3   Entered by:   Adella Hare LPN   Authorized by:  Syliva Overman MD   Signed by:   Adella Hare LPN on 16/12/9602   Method used:   [facility]        [facility] [phone]* (retail)       [address]       [place], [place]  [address]       Ph: [phone]       Fax: [phone]   RxID:   [id number]    Orders Added: 1)  Est. Patient Level IV [25366] 2)  CXR- 2view [CXR] 3)  T-Lipid Profile [80061-22930] 4)  T- Hemoglobin A1C [83036-23375] 5)  Radiology Referral [Radiology] 6)  Dermatology Referral [Derma] 7)  Radiology other [Radiology Other] 8)  Neurosurgeon Referral [Neurosurgeon]

## 2010-04-09 NOTE — Letter (Signed)
Summary: [employer]  [employer]   Imported By: Lind Guest 04/02/2010 11:35:36  _____________________________________________________________________  External Attachment:    Type:   Image     Comment:   External Document

## 2010-04-09 NOTE — Letter (Signed)
Summary: OUTPATIENT NUTRITIONAL CARE  OUTPATIENT NUTRITIONAL CARE   Imported By: Lind Guest 03/25/2010 13:38:19  _____________________________________________________________________  External Attachment:    Type:   Image     Comment:   External Document

## 2010-06-29 ENCOUNTER — Encounter: Payer: Self-pay | Admitting: Family Medicine

## 2010-07-21 NOTE — H&P (Signed)
[name], [name]           ACCOUNT NO.:  [id number]   [name]          PATIENT TYPE:  AMB   LOCATION:  DAY                           FACILITY:  [facility]   PHYSICIAN:  Dalia Heading, M.D.  DATE OF BIRTH:  22-Nov-1950   DATE OF ADMISSION:  DATE OF DISCHARGE:  LH                              HISTORY & PHYSICAL   CHIEF COMPLAINT:  Need for screening colonoscopy.   HISTORY OF PRESENT ILLNESS:  Patient is a 60 year old black female who  is referred for endoscopic evaluation.  She needs a colonoscopy for  screening purposes.  No abdominal pain, weight loss, nausea, vomiting,  diarrhea, constipation, melena, hematochezia have been noted.  She has  never had a colonoscopy.  There is no family history of colon carcinoma.   PAST MEDICAL HISTORY:  1. Non-insulin-dependent diabetes mellitus.  2. Hypertension.   PAST SURGICAL HISTORY:  Hysterectomy.   CURRENT MEDICATIONS:  Crestor, glyburide/metformin, Uniretic.   ALLERGIES:  No known drug allergies.   REVIEW OF SYSTEMS:  Noncontributory.   PHYSICAL EXAMINATION:  GENERAL APPEARANCE:  Patient is a well-developed,  well-nourished black female in no acute distress.  LUNGS:  Clear to auscultation with equal breath sounds bilaterally.  CARDIOVASCULAR:  Regular rate and rhythm without S3, S4 or murmurs.  ABDOMEN:  Soft, nontender, nondistended.  No hepatosplenomegaly or  masses noted.  RECTAL:  Examination was deferred to the procedure.   IMPRESSION:  Need for screening colonoscopy.   PLAN:  The patient is scheduled for a colonoscopy on September 19, 2007.  The  risks and benefits of the procedure including bleeding and perforation  were fully explained to the patient, gave informed consent.      Dalia Heading, M.D.  Electronically Signed     MAJ/MEDQ  D:  09/18/2007  T:  09/18/2007  Job:  [id number]   cc:   Short Stay at [facility] E. Lodema Hong, M.D.  Fax: [phone]

## 2010-07-24 NOTE — Procedures (Signed)
[name], [name] NO.:  [id number]   [name]                   PATIENT TYPE:  OUT   LOCATION:  RAD                                  FACILITY:  [facility]   PHYSICIAN:  Vida Roller, M.D.                DATE OF BIRTH:  03-Sep-1950   DATE OF PROCEDURE:  DATE OF DISCHARGE:                                    STRESS TEST   INDICATIONS:  [name] is a 60 year old female with no known cardiology  with the following cardiac risk factors:  Family history, hypertension,  diabetes, and hyperlipidemia.  [name] wants to start on an exercise  program and wanted to be evaluated prior to this.   BASELINE DATA:  EKG revealed sinus rhythm at 71 beats per minute with  nonspecific ST abnormalities.  The blood pressure was 118/72.  The patient  exercised for a total of 5 minutes and 30 seconds to Bruce protocol stage 2  and 7.0 mets.  The maximum heart rate was 173 beats per minute, which is  104% of predicted maximum.  The maximum blood pressure was 188/92, which  resolved down to 112/70 in recovery.  The patient denied any symptoms during  exercise.  The test was stopped secondary to fatigue.  EKG revealed no  ischemic changes and no arrhythmias.   Final images and results are pending M.D. review.     ________________________________________  ___________________________________________  [name], P.A. [facility]                      Vida Roller, M.D.   AB/MEDQ  D:  04/08/2003  T:  04/08/2003  Job:  [id number]

## 2010-08-04 ENCOUNTER — Other Ambulatory Visit: Payer: Self-pay | Admitting: Family Medicine

## 2010-08-13 ENCOUNTER — Encounter: Payer: Self-pay | Admitting: Family Medicine

## 2010-08-14 ENCOUNTER — Other Ambulatory Visit: Payer: Self-pay | Admitting: Family Medicine

## 2010-08-15 LAB — LIPID PANEL
Cholesterol: 269 mg/dL — ABNORMAL HIGH (ref 0–200)
HDL: 90 mg/dL (ref >39)
LDL Cholesterol: 163 mg/dL — ABNORMAL HIGH (ref 0–99)
Total CHOL/HDL Ratio: 3 Ratio
Triglycerides: 78 mg/dL (ref <150)
VLDL: 16 mg/dL (ref 0–40)

## 2010-08-15 LAB — HEMOGLOBIN A1C
Hgb A1c MFr Bld: 7.4 % — ABNORMAL HIGH (ref <5.7)
Mean Plasma Glucose: 166 mg/dL — ABNORMAL HIGH (ref <117)

## 2010-08-17 ENCOUNTER — Encounter: Payer: Self-pay | Admitting: Family Medicine

## 2010-08-17 ENCOUNTER — Ambulatory Visit (INDEPENDENT_AMBULATORY_CARE_PROVIDER_SITE_OTHER): Payer: BC Managed Care – PPO | Admitting: Family Medicine

## 2010-08-17 VITALS — BP 120/80 | HR 68 | Resp 16 | Ht 65.0 in | Wt 141.1 lb

## 2010-08-17 DIAGNOSIS — E785 Hyperlipidemia, unspecified: Secondary | ICD-10-CM

## 2010-08-17 DIAGNOSIS — I10 Essential (primary) hypertension: Secondary | ICD-10-CM

## 2010-08-17 DIAGNOSIS — Z0000 Encounter for general adult medical examination without abnormal findings: Secondary | ICD-10-CM

## 2010-08-17 DIAGNOSIS — E119 Type 2 diabetes mellitus without complications: Secondary | ICD-10-CM

## 2010-08-17 DIAGNOSIS — Z1211 Encounter for screening for malignant neoplasm of colon: Secondary | ICD-10-CM

## 2010-08-17 DIAGNOSIS — M546 Pain in thoracic spine: Secondary | ICD-10-CM

## 2010-08-17 DIAGNOSIS — M542 Cervicalgia: Secondary | ICD-10-CM

## 2010-08-17 NOTE — Patient Instructions (Addendum)
F/u in 3.5 months.  Your sugar is better, but still needs to improve, so pls commit to daily exercise and following a diabetic diet, classesre available at the hospital.  Your cholesterol is EXTREMELY high and with your family history and the fact that you are diabetic , youshould take med. Since you can tolerate red rice yeast, take 1 to 2 per day pls.  You need an eye exam.  Fasting lipid, cmp and egfr, HBA1C in 3,5 months  You are being referred to physical therapy re neck and upper back pain  You ned to return for your TdAP , and pneumonia vaccine, also pls check on the shingles vaccine to see if it i covered

## 2010-08-17 NOTE — Progress Notes (Signed)
  Subjective:    Patient ID: [name], female    DOB: January 16, 1951, 60 y.o.   MRN: [id number]  HPI  Pt reports improved right neck and shoulder pain, does  Not want  surgery , interested in therapy Occasional reflux symptoms intermittently for 1 month, thinks this is diet related x 1 month, does not want med at this time Though scheduled for a physical does not want pelvic exam, she has had  a TAH for benign reasons HYPERTENSION Disease Monitoring Blood pressure range-130/80 Chest pain- no      Dyspnea- no Medications Compliance- good Lightheadedness- no   Edema- no   DIABETES Disease Monitoring Blood Sugar ranges-130 to 150 Polyuria- no New Visual problems- no Medications Compliance- good Hypoglycemic symptoms- no   HYPERLIPIDEMIA Disease Monitoring See symptoms for Hypertension Medications Compliance-goodRUQ pain- no  Muscle aches- no     Review of Systems    Denies recent fever or chills. Denies sinus pressure, nasal congestion, ear pain or sore throat. Denies chest congestion, productive cough or wheezing. Denies chest pains, palpitations, paroxysmal nocturnal dyspnea, orthopnea and leg swelling Denies abdominal pain, nausea, vomiting,diarrhea or constipation.  Denies rectal bleeding or change in bowel movement. Denies dysuria, frequency, hesitancy or incontinence. Denies joint pain, swelling and limitation in mobility. Denies headaches, seizure, numbness, or tingling. Denies depression, anxiety or insomnia. Denies skin break down or rash.     Objective:   Physical Exam Pleasant well nourished female, alert and oriented x 3, in no cardio-pulmonary distress. Afebrile. HEENT No facial trauma or asymetry.No sinus tenderness EOMI, PERTL, fundoscopic exam is normal, no hemorhage or exudate. Oropharynx moist, no exudate, good dentition. Neck: decreased ROM, no adenopathy,JVD or thyromegaly.No bruits.  Chest: Clear to ascultation bilaterally.No crackles or  wheezes. Non tender to palpation  Breast: No asymetry,no masses. No nipple discharge or inversion. No axillary or supraclavicular adenopathy  Cardiovascular system; Heart sounds normal,  S1 and  S2 ,no S3.  No murmur, or thrill. Apical beat not displaced Peripheral pulses normal.  Abdomen: Soft, non tender, no organomegaly or masses. No bruits. Bowel sounds normal. No guarding, tenderness or rebound.  Rectal:  No mass. QI:ONGEXBMW per pt request  Musculoskeletal exam: Full ROM hips , shoulders and knees.Decreased ROM neck with right trapezius spasm  No deformity ,swelling or crepitus noted. No muscle wasting or atrophy.   Neurologic: Cranial nerves 2 to 12 intact. Power, tone ,sensation and reflexes normal throughout. No disturbance in gait. No tremor.  Skin: Intact, no ulceration, erythema , scaling or rash noted. Pigmentation normal throughout  Psych; Normal mood and affect. Judgement and concentration normal        Assessment & Plan:

## 2010-08-27 ENCOUNTER — Ambulatory Visit (HOSPITAL_COMMUNITY)
Admission: RE | Admit: 2010-08-27 | Discharge: 2010-08-27 | Disposition: A | Discharge status: Home / Self Care | Payer: BC Managed Care – PPO | Source: Ambulatory Visit | Attending: Family Medicine | Admitting: Family Medicine

## 2010-08-27 DIAGNOSIS — E119 Type 2 diabetes mellitus without complications: Secondary | ICD-10-CM | POA: Insufficient documentation

## 2010-08-27 DIAGNOSIS — M546 Pain in thoracic spine: Secondary | ICD-10-CM | POA: Insufficient documentation

## 2010-08-27 DIAGNOSIS — M6281 Muscle weakness (generalized): Secondary | ICD-10-CM | POA: Insufficient documentation

## 2010-08-27 DIAGNOSIS — M542 Cervicalgia: Secondary | ICD-10-CM | POA: Insufficient documentation

## 2010-08-27 DIAGNOSIS — IMO0001 Reserved for inherently not codable concepts without codable children: Secondary | ICD-10-CM | POA: Insufficient documentation

## 2010-08-27 DIAGNOSIS — I10 Essential (primary) hypertension: Secondary | ICD-10-CM | POA: Insufficient documentation

## 2010-08-30 NOTE — Assessment & Plan Note (Signed)
Unchanged, pt referred to physical therapy

## 2010-08-30 NOTE — Assessment & Plan Note (Signed)
Deteriorated, low fat diet discussed, pt states she has increased cramps with statins

## 2010-08-30 NOTE — Assessment & Plan Note (Signed)
Controlled, no change in medication  

## 2010-08-30 NOTE — Assessment & Plan Note (Signed)
improved

## 2010-08-31 LAB — POC HEMOCCULT BLD/STL (OFFICE/1-CARD/DIAGNOSTIC): Fecal Occult Blood, POC: NEGATIVE

## 2010-08-31 NOTE — Progress Notes (Signed)
Addended by: Adella Hare B on: 08/31/2010 01:41 PM   Modules accepted: Orders

## 2010-09-01 ENCOUNTER — Ambulatory Visit (HOSPITAL_COMMUNITY): Payer: BC Managed Care – PPO | Admitting: Occupational Therapy

## 2010-10-06 ENCOUNTER — Encounter: Payer: Self-pay | Admitting: Family Medicine

## 2010-11-27 ENCOUNTER — Other Ambulatory Visit: Payer: Self-pay | Admitting: Family Medicine

## 2010-12-23 ENCOUNTER — Ambulatory Visit: Payer: BC Managed Care – PPO | Admitting: Family Medicine

## 2011-01-05 ENCOUNTER — Ambulatory Visit: Payer: BC Managed Care – PPO | Admitting: Family Medicine

## 2011-06-22 ENCOUNTER — Ambulatory Visit (INDEPENDENT_AMBULATORY_CARE_PROVIDER_SITE_OTHER): Payer: BC Managed Care – PPO | Admitting: Family Medicine

## 2011-06-22 ENCOUNTER — Encounter: Payer: Self-pay | Admitting: Family Medicine

## 2011-06-22 VITALS — BP 130/80 | HR 90 | Resp 18 | Ht 65.0 in | Wt 143.0 lb

## 2011-06-22 DIAGNOSIS — K649 Unspecified hemorrhoids: Secondary | ICD-10-CM | POA: Insufficient documentation

## 2011-06-22 DIAGNOSIS — I10 Essential (primary) hypertension: Secondary | ICD-10-CM

## 2011-06-22 DIAGNOSIS — E119 Type 2 diabetes mellitus without complications: Secondary | ICD-10-CM

## 2011-06-22 DIAGNOSIS — E785 Hyperlipidemia, unspecified: Secondary | ICD-10-CM

## 2011-06-22 HISTORY — DX: Unspecified hemorrhoids: K64.9

## 2011-06-22 MED ORDER — MOEXIPRIL-HYDROCHLOROTHIAZIDE 7.5-12.5 MG PO TABS: 1.0000 | ORAL_TABLET | Freq: Every day | ORAL | Status: DC

## 2011-06-22 MED ORDER — HYDROCORTISONE ACETATE 25 MG RE SUPP: 25.0000 mg | Freq: Two times a day (BID) | RECTAL | Status: AC

## 2011-06-22 MED ORDER — GLYBURIDE-METFORMIN 5-500 MG PO TABS: 2.0000 | ORAL_TABLET | Freq: Two times a day (BID) | ORAL | Status: DC

## 2011-06-22 NOTE — Patient Instructions (Addendum)
F/u in 4 month  Fasting lipid, cmp and EGFR, cbc and HBA1C today.  Microalb from office today.  Please schedule your mammogram this is past due  Fasting lipid, cmp and HBA1C in 4 month  Rectal pain is from hemorhoids, and medication is sent in  TDAP and the shingles vaccines are both recommended for you

## 2011-06-22 NOTE — Progress Notes (Signed)
  Subjective:    Patient ID: [name], female    DOB: [birth year], 61 y.o.   MRN: [id number]  HPI The PT is here for follow up and re-evaluation of chronic medical conditions, medication management and review of any available recent lab and radiology data.  Preventive health is updated, specifically  Cancer screening and Immunization.Still refusing immunization   Questions or concerns regarding consultations or procedures which the PT has had in the interim are  addressed. The PT denies any adverse reactions to current medications since the last visit.  C/o rectal pain worse with defecation in the past 1 week Blood sugars are not being checked regularly, and when checked appear higher than goal. Despite this pt reports "feeling well"      Review of Systems See HPI Denies recent fever or chills. Denies sinus pressure, nasal congestion, ear pain or sore throat. Denies chest congestion, productive cough or wheezing. Denies chest pains, palpitations and leg swelling Denies abdominal pain, nausea, vomiting,diarrhea or constipation.   Denies dysuria, frequency, hesitancy or incontinence. Denies joint pain, swelling and limitation in mobility. Denies headaches, seizures, numbness, or tingling. Denies depression, anxiety or insomnia. Denies skin break down or rash.        Objective:   Physical Exam Patient alert and oriented and in no cardiopulmonary distress.  HEENT: No facial asymmetry, EOMI, no sinus tenderness,  oropharynx pink and moist.  Neck supple no adenopathy.  Chest: Clear to auscultation bilaterally.  CVS: S1, S2 no murmurs, no S3.  ABD: Soft non tender. Bowel sounds normal. Rectal: guaiac neg stool and hemmorhoids Ext: No edema  MS: Adequate ROM spine, shoulders, hips and knees.  Skin: Intact, no ulcerations or rash noted.  Psych: Good eye contact, normal affect. Memory intact not anxious or depressed appearing.  CNS: CN 2-12 intact, power, tone and  sensation normal throughout.        Assessment & Plan:

## 2011-06-23 LAB — MICROALBUMIN / CREATININE URINE RATIO
Creatinine, Urine: 68.6 mg/dL
Microalb Creat Ratio: 7.3 mg/g (ref 0.0–30.0)
Microalb, Ur: 0.5 mg/dL (ref 0.00–1.89)

## 2011-06-24 LAB — CBC
HCT: 39.5 % (ref 36.0–46.0)
Hemoglobin: 12.1 g/dL (ref 12.0–15.0)
MCH: 28.7 pg (ref 26.0–34.0)
MCHC: 30.6 g/dL (ref 30.0–36.0)
MCV: 93.6 fL (ref 78.0–100.0)
Platelets: 381 10*3/uL (ref 150–400)
RBC: 4.22 MIL/uL (ref 3.87–5.11)
RDW: 13.5 % (ref 11.5–15.5)
WBC: 3.8 10*3/uL — ABNORMAL LOW (ref 4.0–10.5)

## 2011-06-24 LAB — LIPID PANEL
Cholesterol: 220 mg/dL — ABNORMAL HIGH (ref 0–200)
HDL: 83 mg/dL (ref >39)
LDL Cholesterol: 120 mg/dL — ABNORMAL HIGH (ref 0–99)
Total CHOL/HDL Ratio: 2.7 Ratio
Triglycerides: 84 mg/dL (ref <150)
VLDL: 17 mg/dL (ref 0–40)

## 2011-06-24 LAB — COMPLETE METABOLIC PANEL WITH GFR
ALT: 11 U/L (ref 0–35)
AST: 12 U/L (ref 0–37)
Albumin: 4.8 g/dL (ref 3.5–5.2)
Alkaline Phosphatase: 84 U/L (ref 39–117)
BUN: 20 mg/dL (ref 6–23)
CO2: 30 mEq/L (ref 19–32)
Calcium: 9.7 mg/dL (ref 8.4–10.5)
Chloride: 104 mEq/L (ref 96–112)
Creat: 0.53 mg/dL (ref 0.50–1.10)
GFR, Est African American: >89 mL/min
GFR, Est Non African American: >89 mL/min
Glucose, Bld: 135 mg/dL — ABNORMAL HIGH (ref 70–99)
Potassium: 4.5 mEq/L (ref 3.5–5.3)
Sodium: 142 mEq/L (ref 135–145)
Total Bilirubin: 0.4 mg/dL (ref 0.3–1.2)
Total Protein: 7 g/dL (ref 6.0–8.3)

## 2011-06-24 LAB — HEMOGLOBIN A1C
Hgb A1c MFr Bld: 7.9 % — ABNORMAL HIGH (ref <5.7)
Mean Plasma Glucose: 180 mg/dL — ABNORMAL HIGH (ref <117)

## 2011-06-30 ENCOUNTER — Ambulatory Visit: Payer: BC Managed Care – PPO | Admitting: Family Medicine

## 2011-07-18 NOTE — Assessment & Plan Note (Signed)
Controlled, no change in medication  

## 2011-07-18 NOTE — Assessment & Plan Note (Signed)
Deteriorated , compliance with diet and medication stressed, both continue to be a problem

## 2011-07-18 NOTE — Assessment & Plan Note (Signed)
Uncontrolled, p t has reported statin intolerance in the past, she is encouraged o re consider a trial.  Hyperlipidemia:Low fat diet discussed and encouraged.

## 2011-07-18 NOTE — Assessment & Plan Note (Signed)
Symptomatic piles with pain, topical med prescribed, and the importance of voiding often and soft stool stresed

## 2011-07-26 ENCOUNTER — Telehealth: Payer: Self-pay | Admitting: Family Medicine

## 2011-07-27 NOTE — Telephone Encounter (Signed)
Rectal pain. Was told before that she had hemorrhoids and was given suppositories but she doesn't think that's where her problem is coming from. Seems to her like tailbone pain. Feels like its more so on the left side. Not very painful but it is staying very sore and she is concerned now. Wants to know if she can get an xray of the area to see what's going on

## 2011-07-27 NOTE — Telephone Encounter (Signed)
Left message, She only needs an x-ray if she has fallen or injured herself, please verify this Called and LVM for pt to return call in the AM

## 2011-07-27 NOTE — Telephone Encounter (Signed)
Called patient and left message for them to return call at the office   

## 2011-07-28 NOTE — Telephone Encounter (Signed)
Patient aware. No trauma or fall. Wanted some info on hemorrhoids and diverticulosis mailed to her

## 2011-08-03 ENCOUNTER — Other Ambulatory Visit: Payer: Self-pay | Admitting: Family Medicine

## 2011-08-03 ENCOUNTER — Telehealth: Payer: Self-pay | Admitting: Family Medicine

## 2011-08-03 DIAGNOSIS — K649 Unspecified hemorrhoids: Secondary | ICD-10-CM

## 2011-08-03 NOTE — Telephone Encounter (Signed)
I Advised pt I will refer her to general surgeon for evalof hemmorhoids Dr Lovell Sheehan, pls refer, order entered

## 2011-10-14 ENCOUNTER — Other Ambulatory Visit: Payer: Self-pay

## 2011-10-14 MED ORDER — MOEXIPRIL-HYDROCHLOROTHIAZIDE 7.5-12.5 MG PO TABS: 1.0000 | ORAL_TABLET | Freq: Every day | ORAL | Status: DC

## 2011-10-19 ENCOUNTER — Encounter: Payer: Self-pay | Admitting: Family Medicine

## 2011-10-19 ENCOUNTER — Ambulatory Visit (INDEPENDENT_AMBULATORY_CARE_PROVIDER_SITE_OTHER): Payer: BC Managed Care – PPO | Admitting: Family Medicine

## 2011-10-19 VITALS — BP 118/70 | HR 113 | Resp 16 | Ht 64.0 in | Wt 142.4 lb

## 2011-10-19 DIAGNOSIS — I10 Essential (primary) hypertension: Secondary | ICD-10-CM

## 2011-10-19 DIAGNOSIS — E785 Hyperlipidemia, unspecified: Secondary | ICD-10-CM

## 2011-10-19 DIAGNOSIS — L723 Sebaceous cyst: Secondary | ICD-10-CM

## 2011-10-19 DIAGNOSIS — E119 Type 2 diabetes mellitus without complications: Secondary | ICD-10-CM

## 2011-10-19 DIAGNOSIS — K649 Unspecified hemorrhoids: Secondary | ICD-10-CM

## 2011-10-19 MED ORDER — MOEXIPRIL-HYDROCHLOROTHIAZIDE 7.5-12.5 MG PO TABS: 1.0000 | ORAL_TABLET | Freq: Every day | ORAL | Status: DC

## 2011-10-19 NOTE — Patient Instructions (Addendum)
CPE in 4months.  Blood pressure is excellent , no med change.  hBA1c today.  Please follow a low fat diet  Mammogram to be scheduled please schedule  Contact Dr ziggler about hemmorhoid.  You still need the vaccines

## 2011-10-20 LAB — HEMOGLOBIN A1C
Hgb A1c MFr Bld: 8.4 % — ABNORMAL HIGH (ref <5.7)
Mean Plasma Glucose: 194 mg/dL — ABNORMAL HIGH (ref <117)

## 2011-10-22 NOTE — Progress Notes (Signed)
  Subjective:    Patient ID: [name], female    DOB: Jul 11, 1950, 61 y.o.   MRN: [id number]  HPI The PT is here for follow up and re-evaluation of chronic medical conditions, medication management and review of any available recent lab and radiology data.  Preventive health is updated, specifically  Cancer screening and Immunization.   Questions or concerns regarding consultations or procedures which the PT has had in the interim are  Addressed.Saw surgeon re piles, conservative approach suggested reportedly, still symptomatic, will call back The PT denies any adverse reactions to current medications since the last visit.  Multiple cystic lesions recurrent on back wants any ready for extrusion of material to be done. No t testing blood sugars regularly and non compliant with medication      Review of Systems See HPI Denies recent fever or chills. Denies sinus pressure, nasal congestion, ear pain or sore throat. Denies chest congestion, productive cough or wheezing. Denies chest pains, palpitations and leg swelling Denies abdominal pain, nausea, vomiting,diarrhea or constipation.   Denies dysuria, frequency, hesitancy or incontinence. Denies joint pain, swelling and limitation in mobility. Denies headaches, seizures, numbness, or tingling. Denies depression, anxiety or insomnia. Denies skin break down or rash.        Objective:   Physical Exam Patient alert and oriented and in no cardiopulmonary distress.  HEENT: No facial asymmetry, EOMI, no sinus tenderness,  oropharynx pink and moist.  Neck supple no adenopathy.  Chest: Clear to auscultation bilaterally.  CVS: S1, S2 no murmurs, no S3.  ABD: Soft non tender. Bowel sounds normal.  Ext: No edema  MS: Adequate ROM spine, shoulders, hips and knees.  Skin: Intact, sebaceous cysts on back, one had material extruded with no complications  Psych: Good eye contact, normal affect. Memory intact not anxious or  depressed appearing.  CNS: CN 2-12 intact, power, tone and sensation normal throughout.        Assessment & Plan:

## 2011-10-22 NOTE — Assessment & Plan Note (Signed)
Still symptomatic, pt to re contact surgeon for definitive management

## 2011-10-22 NOTE — Assessment & Plan Note (Signed)
Material extruded from cyst on her upper back, no skin breakdown or complications

## 2011-10-22 NOTE — Assessment & Plan Note (Signed)
Uncontrolled, non compliant , high risk for CAd , pt to start med and follow low fat diet

## 2011-10-22 NOTE — Assessment & Plan Note (Signed)
Uncontrolled due to non compliance with medicationaespecialy also diet , pt to change her bghavior

## 2011-10-22 NOTE — Assessment & Plan Note (Signed)
Controlled, no change in medication DASH diet and commitment to daily physical activity for a minimum of 30 minutes discussed and encouraged, as a part of hypertension management. The importance of attaining a healthy weight is also discussed.  

## 2011-10-25 ENCOUNTER — Ambulatory Visit: Payer: BC Managed Care – PPO | Admitting: Family Medicine

## 2012-03-21 ENCOUNTER — Telehealth: Payer: Self-pay

## 2012-03-21 NOTE — Telephone Encounter (Signed)
OTC robitussin DM sugar free 1 teaspoon 3 times daily as needed. If develops fever , yellow sputum ofr worsens in next 3 to 7 days needs OV

## 2012-03-22 ENCOUNTER — Ambulatory Visit (INDEPENDENT_AMBULATORY_CARE_PROVIDER_SITE_OTHER): Payer: BC Managed Care – PPO | Admitting: Family Medicine

## 2012-03-22 ENCOUNTER — Encounter: Payer: Self-pay | Admitting: Family Medicine

## 2012-03-22 VITALS — BP 128/82 | HR 97 | Resp 18 | Ht 64.0 in | Wt 149.0 lb

## 2012-03-22 DIAGNOSIS — E785 Hyperlipidemia, unspecified: Secondary | ICD-10-CM

## 2012-03-22 DIAGNOSIS — I10 Essential (primary) hypertension: Secondary | ICD-10-CM

## 2012-03-22 DIAGNOSIS — E119 Type 2 diabetes mellitus without complications: Secondary | ICD-10-CM

## 2012-03-22 MED ORDER — GLYBURIDE-METFORMIN 5-500 MG PO TABS: 2.0000 | ORAL_TABLET | Freq: Two times a day (BID) | ORAL | Status: DC

## 2012-03-22 NOTE — Telephone Encounter (Signed)
Patient in for appointment 1/15

## 2012-03-22 NOTE — Patient Instructions (Addendum)
F/u in 4.5 month, with diabetic foot exam   Fasting lipid, cmp and EgfR, HBA1C today.  Fasting lipid, HBa1C and cmp in 4 month    It is important that you exercise regularly at least 30 minutes 5 times a week. If you develop chest pain, have severe difficulty breathing, or feel very tired, stop exercising immediately and seek medical attention    A healthy diet is rich in fruit, vegetables and whole grains. Poultry fish, nuts and beans are a healthy choice for protein rather then red meat. A low sodium diet and drinking 64 ounces of water daily is generally recommended. Oils and sweet should be limited. Carbohydrates especially for those who are diabetic or overweight, should be limited to 30 to 45 gram per meal. It is important to eat on a regular schedule, at least 3 times daily. Snacks should be primarily fruits, vegetables or nuts.  You need the shingles vaccine also TdaP   PLEASE get th flu vaccine today  At the pharmacy

## 2012-03-26 NOTE — Progress Notes (Signed)
  Subjective:    Patient ID: [name], female    DOB: 1950/12/13, 62 y.o.   MRN: [id number]  HPI The PT is here for follow up and re-evaluation of chronic medical conditions, medication management and review of any available recent lab and radiology data.  Preventive health is updated, specifically  Cancer screening and Immunization.   Questions or concerns regarding consultations or procedures which the PT has had in the interim are  addressed. The PT denies any adverse reactions to current medications since the last visit.  2 day h/o sore throat , no fever , chills,green  sinus drainage or productive cough. C/o increased clear nasal drainage  Not testing blood sugars regularly, not taking cholesterol meds. Denies polyuria, polydipsia or blurred vision    Review of Systems See HPI Denies chest pains, palpitations and leg swelling Denies abdominal pain, nausea, vomiting,diarrhea or constipation.   Denies dysuria, frequency, hesitancy or incontinence. Denies joint pain, swelling and limitation in mobility. Denies headaches, seizures, numbness, or tingling. Denies depression, anxiety or insomnia. Denies skin break down or rash.        Objective:   Physical Exam  Patient alert and oriented and in no cardiopulmonary distress.  HEENT: No facial asymmetry, EOMI, no sinus tenderness,  oropharynx pink and moist.  Neck supple no adenopathy.  Chest: Clear to auscultation bilaterally.  CVS: S1, S2 no murmurs, no S3.  ABD: Soft non tender. Bowel sounds normal.  Ext: No edema  MS: Adequate ROM spine, shoulders, hips and knees.  Skin: Intact, no ulcerations or rash noted.  Psych: Good eye contact, normal affect. Memory intact not anxious or depressed appearing.  CNS: CN 2-12 intact, power, tone and sensation normal throughout.       Assessment & Plan:

## 2012-03-26 NOTE — Assessment & Plan Note (Signed)
Controlled, no change in medication DASH diet and commitment to daily physical activity for a minimum of 30 minutes discussed and encouraged, as a part of hypertension management. The importance of attaining a healthy weight is also discussed.  

## 2012-03-26 NOTE — Assessment & Plan Note (Signed)
Uncontrolled when last checked, and labs are past due Patient advised to reduce carb and sweets, commit to regular physical activity, take meds as prescribed, test blood as directed, and attempt to lose weight, to improve blood sugar control. Marland Kitchen

## 2012-03-26 NOTE — Assessment & Plan Note (Signed)
Hyperlipidemia:Low fat diet discussed and encouraged.  Pt needs updated lab

## 2012-04-13 ENCOUNTER — Telehealth: Payer: Self-pay

## 2012-04-13 ENCOUNTER — Other Ambulatory Visit: Payer: Self-pay | Admitting: Family Medicine

## 2012-04-13 ENCOUNTER — Other Ambulatory Visit: Payer: Self-pay

## 2012-04-13 MED ORDER — NAPROXEN 500 MG PO TABS: 500.0000 mg | ORAL_TABLET | Freq: Two times a day (BID) | ORAL | Status: DC

## 2012-04-13 NOTE — Telephone Encounter (Signed)
ADVISE PLS THAT NAPROXEN HAS BEEN RECENTLY SHOWN TO HAVE LESS POTENTIAL BAD SIDE EFFECTS THAN IBUPROFEN SO i HAVE ENTERED THIS PLS SEND IN, ACTS LIKE IBUPROFEN FOR PAIN, PLEASE LET HER KNOW

## 2012-04-14 ENCOUNTER — Ambulatory Visit: Payer: BC Managed Care – PPO | Admitting: Family Medicine

## 2012-04-24 ENCOUNTER — Ambulatory Visit (INDEPENDENT_AMBULATORY_CARE_PROVIDER_SITE_OTHER): Payer: BC Managed Care – PPO | Admitting: Family Medicine

## 2012-04-24 ENCOUNTER — Encounter: Payer: Self-pay | Admitting: Family Medicine

## 2012-04-24 ENCOUNTER — Ambulatory Visit (HOSPITAL_COMMUNITY)
Admission: RE | Admit: 2012-04-24 | Discharge: 2012-04-24 | Disposition: A | Discharge status: Home / Self Care | Payer: BC Managed Care – PPO | Source: Ambulatory Visit | Attending: Family Medicine | Admitting: Family Medicine

## 2012-04-24 VITALS — BP 140/84 | HR 95 | Resp 16 | Ht 64.0 in | Wt 147.1 lb

## 2012-04-24 DIAGNOSIS — L723 Sebaceous cyst: Secondary | ICD-10-CM

## 2012-04-24 DIAGNOSIS — M542 Cervicalgia: Secondary | ICD-10-CM

## 2012-04-24 DIAGNOSIS — R05 Cough: Secondary | ICD-10-CM

## 2012-04-24 DIAGNOSIS — E119 Type 2 diabetes mellitus without complications: Secondary | ICD-10-CM

## 2012-04-24 DIAGNOSIS — I10 Essential (primary) hypertension: Secondary | ICD-10-CM | POA: Insufficient documentation

## 2012-04-24 DIAGNOSIS — R059 Cough, unspecified: Secondary | ICD-10-CM

## 2012-04-24 DIAGNOSIS — E785 Hyperlipidemia, unspecified: Secondary | ICD-10-CM

## 2012-04-24 DIAGNOSIS — Z23 Encounter for immunization: Secondary | ICD-10-CM

## 2012-04-24 MED ORDER — TIZANIDINE HCL 4 MG PO TABS: ORAL_TABLET | ORAL | Status: AC

## 2012-04-24 MED ORDER — PREDNISONE (PAK) 5 MG PO TABS: 5.0000 mg | ORAL_TABLET | ORAL | Status: DC

## 2012-04-24 MED ORDER — AMLODIPINE BESYLATE 5 MG PO TABS: 5.0000 mg | ORAL_TABLET | Freq: Every day | ORAL | Status: DC

## 2012-04-24 MED ORDER — FLUCONAZOLE 150 MG PO TABS: ORAL_TABLET | ORAL | Status: AC

## 2012-04-24 NOTE — Patient Instructions (Addendum)
F/u in  5 to 7 weeks.  New medication for blood pressure,anlodipine, I believe the cough is an adverse reaction to the medicatuin you are currently taking.So pLEASE STOP moexepril today  TdAP and flu vaccine today.  Please schedule your mammogram  Prednisone does pack and muscle relaxant sent for neck pain.Fluconazole sent iin for vaginal itch  You are referred to surgery re cyst on upper back

## 2012-04-24 NOTE — Progress Notes (Signed)
  Subjective:    Patient ID: [name], female    DOB: 07-26-1950, 62 y.o.   MRN: [id number]  HPI The PT is here for follow up and re-evaluation of chronic medical conditions, medication management and review of any available recent la, none available and will be obtained in the near future and radiology data.  Preventive health is updated, specifically  Cancer screening and Immunization.  Needs mammogram Questions or concerns regarding consultations or procedures which the PT has had in the interim are  Addressed.Saw dermatology for recurrent cyst on left back, taking antibiotics, causing vaginal thrush, she has been advised she needs surgical excision with which I concur,she ageres to go 11 day h/o increased right neck and upper extremity pain and spasm, has established disc disease at C7, thinks this was aggravated by excessive use of a stapler Still not checking blood sugars as she should be reportedly       Review of Systems See HPI Denies recent fever or chills. Denies sinus pressure, nasal congestion, ear pain or sore throat. Denies chest congestion, productive cough or wheezing. Denies chest pains, palpitations and leg swelling Denies abdominal pain, nausea, vomiting,diarrhea or constipation.   Denies dysuria, frequency, hesitancy or incontinence. Denies headaches, seizures, numbness, or tingling. Denies depression, anxiety or insomnia.        Objective:   Physical Exam  Patient alert and oriented and in no cardiopulmonary distress.  HEENT: No facial asymmetry, EOMI, no sinus tenderness,  oropharynx pink and moist.  Neck decreased ROM with right trapezius spasm, no adenopathy.  Chest: Clear to auscultation bilaterally.  CVS: S1, S2 no murmurs, no S3.  ABD: Soft non tender. Bowel sounds normal.  Ext: No edema  MS: decreased  ROM cervical and thoracic  Spine,adequate in  shoulders, hips and knees.  Skin:Large sebaceous cyst on left upper thoracic  region ,  maximum diameter approximately 3.5 inches  Psych: Good eye contact, normal affect. Memory intact not anxious or depressed appearing.  CNS: CN 2-12 intact, power, tone and sensation normal throughout.       Assessment & Plan:

## 2012-04-24 NOTE — Assessment & Plan Note (Addendum)
Chronic dry cough, normal lung exam, rept CXR normal lung fields. Attribute this to ACE

## 2012-04-24 NOTE — Assessment & Plan Note (Signed)
Established disc disease with acute pain and spasm, steroids and muscle relaxant prescribed

## 2012-04-24 NOTE — Assessment & Plan Note (Signed)
Hyperlipidemia:Low fat diet discussed and encouraged.  Updated lab needed 

## 2012-04-24 NOTE — Assessment & Plan Note (Signed)
Large thoracic (posterior) cyst surgical eval

## 2012-04-24 NOTE — Assessment & Plan Note (Signed)
Fair control, but not at goal, also I suspect ACE intolerance causing the cough, will change to amlodipine

## 2012-04-24 NOTE — Assessment & Plan Note (Signed)
Uncontrolled when lasr checked. Patient advised to reduce carb and sweets, commit to regular physical activity, take meds as prescribed, test blood as directed, and attempt to lose weight, to improve blood sugar control. Updated lab asap

## 2012-05-05 LAB — COMPLETE METABOLIC PANEL WITH GFR
ALT: 12 U/L (ref 0–35)
AST: 9 U/L (ref 0–37)
Albumin: 4.5 g/dL (ref 3.5–5.2)
Alkaline Phosphatase: 93 U/L (ref 39–117)
BUN: 15 mg/dL (ref 6–23)
CO2: 22 mEq/L (ref 19–32)
Calcium: 9.5 mg/dL (ref 8.4–10.5)
Chloride: 101 mEq/L (ref 96–112)
Creat: 0.56 mg/dL (ref 0.50–1.10)
GFR, Est African American: >89 mL/min
GFR, Est Non African American: >89 mL/min
Glucose, Bld: 209 mg/dL — ABNORMAL HIGH (ref 70–99)
Potassium: 4.1 mEq/L (ref 3.5–5.3)
Sodium: 138 mEq/L (ref 135–145)
Total Bilirubin: 0.5 mg/dL (ref 0.3–1.2)
Total Protein: 7 g/dL (ref 6.0–8.3)

## 2012-05-05 LAB — LIPID PANEL
Cholesterol: 225 mg/dL — ABNORMAL HIGH (ref 0–200)
HDL: 78 mg/dL (ref >39)
LDL Cholesterol: 128 mg/dL — ABNORMAL HIGH (ref 0–99)
Total CHOL/HDL Ratio: 2.9 Ratio
Triglycerides: 94 mg/dL (ref <150)
VLDL: 19 mg/dL (ref 0–40)

## 2012-05-05 LAB — HEMOGLOBIN A1C
Hgb A1c MFr Bld: 9.1 % — ABNORMAL HIGH (ref <5.7)
Mean Plasma Glucose: 214 mg/dL — ABNORMAL HIGH (ref <117)

## 2012-05-10 ENCOUNTER — Other Ambulatory Visit: Payer: Self-pay | Admitting: Family Medicine

## 2012-05-29 ENCOUNTER — Telehealth: Payer: Self-pay | Admitting: Family Medicine

## 2012-05-29 NOTE — Telephone Encounter (Signed)
Called and left message for patient to return call.  

## 2012-06-14 NOTE — Telephone Encounter (Signed)
Made multiple calls to patient.  Will send letter with results asking for her to contact the office.

## 2012-06-16 ENCOUNTER — Telehealth: Payer: Self-pay | Admitting: Family Medicine

## 2012-06-19 NOTE — Telephone Encounter (Signed)
Both numbers are disconnected- We had sent pt a letter to contact office RE her labs. She called back today but didn't leave a number. Please get an updated number when she calls back.

## 2012-06-21 ENCOUNTER — Other Ambulatory Visit: Payer: Self-pay

## 2012-06-21 DIAGNOSIS — I10 Essential (primary) hypertension: Secondary | ICD-10-CM

## 2012-06-21 MED ORDER — AMLODIPINE BESYLATE 5 MG PO TABS: 5.0000 mg | ORAL_TABLET | Freq: Every day | ORAL | Status: DC

## 2012-07-14 ENCOUNTER — Other Ambulatory Visit: Payer: Self-pay | Admitting: Family Medicine

## 2012-07-14 MED ORDER — PRAVASTATIN SODIUM 20 MG PO TABS: 20.0000 mg | ORAL_TABLET | Freq: Every evening | ORAL | Status: DC

## 2012-07-21 ENCOUNTER — Other Ambulatory Visit: Payer: Self-pay

## 2012-07-21 MED ORDER — PRAVASTATIN SODIUM 20 MG PO TABS: 20.0000 mg | ORAL_TABLET | Freq: Every evening | ORAL | Status: DC

## 2012-07-24 ENCOUNTER — Telehealth: Payer: Self-pay | Admitting: Family Medicine

## 2012-07-24 ENCOUNTER — Encounter: Payer: Self-pay | Admitting: Family Medicine

## 2012-07-24 ENCOUNTER — Other Ambulatory Visit: Payer: Self-pay

## 2012-07-24 MED ORDER — GLUCOSE BLOOD VI STRP: ORAL_STRIP | Status: DC

## 2012-07-24 MED ORDER — ONETOUCH LANCETS MISC: 1.0000 | Freq: Every day | Status: DC

## 2012-07-24 NOTE — Telephone Encounter (Signed)
Cholesterol med already changed to 90 days supply and Courtney to send in strips

## 2012-07-28 ENCOUNTER — Other Ambulatory Visit: Payer: Self-pay

## 2012-07-28 MED ORDER — INSULIN PEN NEEDLE 32G X 4 MM MISC: Status: DC

## 2012-07-28 MED ORDER — INSULIN GLARGINE 100 UNIT/ML ~~LOC~~ SOLN: 10.0000 [IU] | Freq: Every day | SUBCUTANEOUS | Status: DC

## 2012-09-18 ENCOUNTER — Other Ambulatory Visit: Payer: Self-pay | Admitting: Family Medicine

## 2012-09-19 ENCOUNTER — Other Ambulatory Visit: Payer: Self-pay

## 2012-09-19 ENCOUNTER — Encounter: Payer: Self-pay | Admitting: Family Medicine

## 2012-09-19 ENCOUNTER — Ambulatory Visit (INDEPENDENT_AMBULATORY_CARE_PROVIDER_SITE_OTHER): Payer: BC Managed Care – PPO | Admitting: Family Medicine

## 2012-09-19 VITALS — BP 138/82 | HR 97 | Resp 16 | Ht 64.0 in | Wt 148.0 lb

## 2012-09-19 DIAGNOSIS — I10 Essential (primary) hypertension: Secondary | ICD-10-CM

## 2012-09-19 DIAGNOSIS — IMO0001 Reserved for inherently not codable concepts without codable children: Secondary | ICD-10-CM

## 2012-09-19 DIAGNOSIS — B351 Tinea unguium: Secondary | ICD-10-CM

## 2012-09-19 DIAGNOSIS — E785 Hyperlipidemia, unspecified: Secondary | ICD-10-CM

## 2012-09-19 DIAGNOSIS — E119 Type 2 diabetes mellitus without complications: Secondary | ICD-10-CM

## 2012-09-19 DIAGNOSIS — E1165 Type 2 diabetes mellitus with hyperglycemia: Secondary | ICD-10-CM

## 2012-09-19 LAB — LIPID PANEL
Cholesterol: 192 mg/dL (ref 0–200)
HDL: 75 mg/dL (ref >39)
LDL Cholesterol: 104 mg/dL — ABNORMAL HIGH (ref 0–99)
Total CHOL/HDL Ratio: 2.6 Ratio
Triglycerides: 67 mg/dL (ref <150)
VLDL: 13 mg/dL (ref 0–40)

## 2012-09-19 LAB — COMPREHENSIVE METABOLIC PANEL
ALT: 14 U/L (ref 0–35)
AST: 13 U/L (ref 0–37)
Albumin: 4.8 g/dL (ref 3.5–5.2)
Alkaline Phosphatase: 115 U/L (ref 39–117)
BUN: 14 mg/dL (ref 6–23)
CO2: 28 mEq/L (ref 19–32)
Calcium: 9.7 mg/dL (ref 8.4–10.5)
Chloride: 101 mEq/L (ref 96–112)
Creat: 0.51 mg/dL (ref 0.50–1.10)
Glucose, Bld: 168 mg/dL — ABNORMAL HIGH (ref 70–99)
Potassium: 4.4 mEq/L (ref 3.5–5.3)
Sodium: 143 mEq/L (ref 135–145)
Total Bilirubin: 0.4 mg/dL (ref 0.3–1.2)
Total Protein: 7.2 g/dL (ref 6.0–8.3)

## 2012-09-19 LAB — HEMOGLOBIN A1C
Hgb A1c MFr Bld: 8.5 % — ABNORMAL HIGH (ref <5.7)
Mean Plasma Glucose: 197 mg/dL — ABNORMAL HIGH (ref <117)

## 2012-09-19 MED ORDER — TERBINAFINE HCL 250 MG PO TABS: 250.0000 mg | ORAL_TABLET | Freq: Every day | ORAL | Status: DC

## 2012-09-19 MED ORDER — INSULIN PEN NEEDLE 31G X 8 MM MISC: Status: DC

## 2012-09-19 MED ORDER — INSULIN GLARGINE 100 UNIT/ML SOLOSTAR PEN: PEN_INJECTOR | SUBCUTANEOUS | Status: DC

## 2012-09-19 NOTE — Progress Notes (Signed)
Patient given diabetic teaching: insulin  -Instructed to take insulin at the same time daily.  -Instructed to take 13 units daily -Given the importance of regulation -Told to test blood sugars fasting in the am and record readings.  If blood sugar remains above 130 she is to increase Lantus to 16 units.   -Instructed to call the office after 2 weeks if she has any questions  Patient not engaged during teaching

## 2012-09-19 NOTE — Patient Instructions (Addendum)
F/u in 4 month, call if you need me before  You are referred for eye exam, Dr Morton Peters NEED to take lantus insulin every day at the same time  Increase to 13 units every day starting today  After 2 weeks , if your fasting blood sugar is still too high, averaging over 130 increase to 16 units every day  Call with questions  Commit to 30 minutes exercise every day.  New med for toenail fungus , for 3 month  You are referred for eye exam  You still need the pneumonia and shingles vaccines, better for you to get them sooner rather than too late!   HBA1C , cmp, and EGFR, CBc and tSH in 4 month, before visit

## 2012-09-20 LAB — MICROALBUMIN / CREATININE URINE RATIO
Creatinine, Urine: 83.5 mg/dL
Microalb Creat Ratio: 18.2 mg/g (ref 0.0–30.0)
Microalb, Ur: 1.52 mg/dL (ref 0.00–1.89)

## 2012-09-24 NOTE — Progress Notes (Signed)
  Subjective:    Patient ID: [name], female    DOB: Nov 30, 1950, 62 y.o.   MRN: [id number]  HPI The PT is here for follow up and re-evaluation of chronic medical conditions, medication management and review of any available recent lab and radiology data.  Preventive health is updated, specifically  Cancer screening and Immunization.  Still defering necessary immunizations  The PT denies any adverse reactions to current medications since the last visit. On reviewing lab results, pt strated clearly that she did not understand how to titrate the lantus,  Nor did she know that it was a basal form and could be taken independent of meals. A significant amt of time was spent in pt re education, she is also encouraged to see diabetic ed teacher There are no new concerns.  C/o thickness and lifting of toenails, wants med       Review of Systems See HPI Denies recent fever or chills. Denies sinus pressure, nasal congestion, ear pain or sore throat. Denies chest congestion, productive cough or wheezing. Denies chest pains, palpitations and leg swelling Denies abdominal pain, nausea, vomiting,diarrhea or constipation.   Denies dysuria, frequency, hesitancy or incontinence. Denies joint pain, swelling and limitation in mobility. Denies headaches, seizures, numbness, or tingling. Denies depression, anxiety or insomnia. Denies skin break down or rash.        Objective:   Physical Exam  Patient alert and oriented and in no cardiopulmonary distress.  HEENT: No facial asymmetry, EOMI, no sinus tenderness,  oropharynx pink and moist.  Neck supple no adenopathy.  Chest: Clear to auscultation bilaterally.  CVS: S1, S2 no murmurs, no S3.  ABD: Soft non tender. Bowel sounds normal.  Ext: No edema  MS: Adequate ROM spine, shoulders, hips and knees.  Skin: Intact, no ulcerations or rash noted.Severe onychomycosis with lifting of nail bed  Psych: Good eye contact, normal affect.  Memory intact not anxious or depressed appearing.  CNS: CN 2-12 intact, power, tone and sensation normal throughout.       Assessment & Plan:

## 2012-09-24 NOTE — Assessment & Plan Note (Signed)
Controlled, no change in medication DASH diet and commitment to daily physical activity for a minimum of 30 minutes discussed and encouraged, as a part of hypertension management. The importance of attaining a healthy weight is also discussed.  

## 2012-09-24 NOTE — Assessment & Plan Note (Signed)
Improved, needs to titrate lantus upward, explained at visit both by myself a nd nursing staff Patient advised to reduce carb and sweets, commit to regular physical activity, take meds as prescribed, test blood as directed, and attempt to lose weight, to improve blood sugar control.

## 2012-09-24 NOTE — Assessment & Plan Note (Signed)
Improved, no med change Hyperlipidemia:Low fat diet discussed and encouraged.   

## 2012-09-24 NOTE — Assessment & Plan Note (Signed)
Oral agent prescribed x 4months due to severity of infection

## 2012-11-20 ENCOUNTER — Ambulatory Visit (INDEPENDENT_AMBULATORY_CARE_PROVIDER_SITE_OTHER): Payer: BC Managed Care – PPO

## 2012-11-20 ENCOUNTER — Telehealth: Payer: Self-pay | Admitting: Family Medicine

## 2012-11-20 ENCOUNTER — Other Ambulatory Visit: Payer: Self-pay | Admitting: Family Medicine

## 2012-11-20 VITALS — BP 138/84 | Wt 149.8 lb

## 2012-11-20 DIAGNOSIS — M5090 Cervical disc disorder, unspecified, unspecified cervical region: Secondary | ICD-10-CM

## 2012-11-20 DIAGNOSIS — M542 Cervicalgia: Secondary | ICD-10-CM

## 2012-11-20 MED ORDER — KETOROLAC TROMETHAMINE 60 MG/2ML IM SOLN
60.0000 mg | Freq: Once | INTRAMUSCULAR | Status: AC
  Administered 2012-11-20: 60 mg via INTRAMUSCULAR

## 2012-11-20 MED ORDER — METHYLPREDNISOLONE ACETATE 80 MG/ML IJ SUSP
80.0000 mg | Freq: Once | INTRAMUSCULAR | Status: AC
  Administered 2012-11-20: 80 mg via INTRAMUSCULAR

## 2012-11-20 MED ORDER — PREDNISONE (PAK) 5 MG PO TABS: 5.0000 mg | ORAL_TABLET | ORAL | Status: DC

## 2012-11-20 MED ORDER — IBUPROFEN 600 MG PO TABS: ORAL_TABLET | ORAL | Status: AC

## 2012-11-20 NOTE — Telephone Encounter (Signed)
Please advise.  Patient also walked into office.

## 2012-11-20 NOTE — Telephone Encounter (Signed)
oK to offer nurse visit only on either day fo toradol 60mg  IM and depo medrol 80 mg Im , to be followed by ibuprofen 800mg  3 times daily for 10 days and prednisone twice daily for 5 days If she does not want the IM injections let me know so can send in just the tabs  Let her know best option is to go to pain clinic for injectio in the spine, we would refer, orthopedics will not help, if she ends up needing surgery this would be a neurosurgeon Pls document how long she has been having iucontrolled pain  SHOULD BE AVAILABILITY to see me on Wednesday /Thursday, probably best to have her come in to discuss then, pls check and see  Let me know decision taken pls

## 2012-11-20 NOTE — Telephone Encounter (Signed)
Hand written note stated pain clinic referral wanted , this is entered, also prednisone and ibuprofen have bees n sent to her pharmacy. Am holding on pT at this time, pls klet her know

## 2012-11-20 NOTE — Telephone Encounter (Signed)
Patient has been having uncontrolled pain since the middle of last week. Hurting in her neck and through her right shoulder and arm. Will think about pain clinic referral. Coming today for injections and does want the tablets sent.  Wants to see if you can refer her for physical therapy also. No OV necessary

## 2012-11-20 NOTE — Progress Notes (Signed)
toradol 60 mg and depo 80mg  given per Dr with no complications for neck pain that radiates into the right arm for the past week

## 2012-11-21 ENCOUNTER — Other Ambulatory Visit: Payer: Self-pay | Admitting: Family Medicine

## 2012-11-21 DIAGNOSIS — M542 Cervicalgia: Secondary | ICD-10-CM

## 2012-11-21 NOTE — Telephone Encounter (Signed)
Patient aware.

## 2012-11-28 ENCOUNTER — Ambulatory Visit
Admission: RE | Admit: 2012-11-28 | Discharge: 2012-11-28 | Disposition: A | Discharge status: Home / Self Care | Payer: BC Managed Care – PPO | Source: Ambulatory Visit | Attending: Family Medicine | Admitting: Family Medicine

## 2012-11-28 DIAGNOSIS — M542 Cervicalgia: Secondary | ICD-10-CM

## 2012-11-28 MED ORDER — TRIAMCINOLONE ACETONIDE 40 MG/ML IJ SUSP (RADIOLOGY)
60.0000 mg | Freq: Once | INTRAMUSCULAR | Status: AC
  Administered 2012-11-28: 60 mg via EPIDURAL

## 2012-11-28 MED ORDER — IOHEXOL 300 MG/ML  SOLN
1.0000 mL | Freq: Once | INTRAMUSCULAR | Status: AC | PRN
  Administered 2012-11-28: 1 mL via EPIDURAL

## 2012-12-05 ENCOUNTER — Other Ambulatory Visit: Payer: Self-pay | Admitting: Family Medicine

## 2012-12-05 DIAGNOSIS — Z139 Encounter for screening, unspecified: Secondary | ICD-10-CM

## 2012-12-08 ENCOUNTER — Ambulatory Visit (HOSPITAL_COMMUNITY)
Admission: RE | Admit: 2012-12-08 | Discharge: 2012-12-08 | Disposition: A | Discharge status: Home / Self Care | Payer: BC Managed Care – PPO | Source: Ambulatory Visit | Attending: Family Medicine | Admitting: Family Medicine

## 2012-12-08 DIAGNOSIS — Z1231 Encounter for screening mammogram for malignant neoplasm of breast: Secondary | ICD-10-CM | POA: Insufficient documentation

## 2012-12-08 DIAGNOSIS — Z139 Encounter for screening, unspecified: Secondary | ICD-10-CM

## 2012-12-13 ENCOUNTER — Other Ambulatory Visit: Payer: Self-pay | Admitting: Family Medicine

## 2012-12-13 DIAGNOSIS — R928 Other abnormal and inconclusive findings on diagnostic imaging of breast: Secondary | ICD-10-CM

## 2012-12-14 ENCOUNTER — Telehealth: Payer: Self-pay | Admitting: Family Medicine

## 2012-12-14 ENCOUNTER — Other Ambulatory Visit: Payer: Self-pay | Admitting: Family Medicine

## 2012-12-14 DIAGNOSIS — R928 Other abnormal and inconclusive findings on diagnostic imaging of breast: Secondary | ICD-10-CM

## 2012-12-14 NOTE — Telephone Encounter (Signed)
Already signed.

## 2012-12-15 NOTE — Telephone Encounter (Signed)
Patient changed her mind

## 2012-12-19 ENCOUNTER — Ambulatory Visit
Admission: RE | Admit: 2012-12-19 | Discharge: 2012-12-19 | Disposition: A | Discharge status: Home / Self Care | Payer: BC Managed Care – PPO | Source: Ambulatory Visit | Attending: Family Medicine | Admitting: Family Medicine

## 2012-12-19 DIAGNOSIS — R928 Other abnormal and inconclusive findings on diagnostic imaging of breast: Secondary | ICD-10-CM

## 2012-12-26 ENCOUNTER — Other Ambulatory Visit: Payer: BC Managed Care – PPO

## 2013-01-04 ENCOUNTER — Telehealth: Payer: Self-pay | Admitting: Family Medicine

## 2013-01-04 DIAGNOSIS — IMO0001 Reserved for inherently not codable concepts without codable children: Secondary | ICD-10-CM

## 2013-01-04 DIAGNOSIS — E785 Hyperlipidemia, unspecified: Secondary | ICD-10-CM

## 2013-01-05 NOTE — Telephone Encounter (Signed)
I recommend evaluation by neurosurgeon/spine surgeon to see if surgery would be beneficial or if they would refer physical therapy and continued follow up. She has had one epidural so far, but that is best way to go if not satisfied with that If she has not had HBA1C lipids cmp and EGFR in past 4 months,needs this and an oV here check and address this during the call also pls

## 2013-01-05 NOTE — Telephone Encounter (Signed)
Called and left message for patient to return call.  

## 2013-01-08 NOTE — Addendum Note (Signed)
Addended by: Kandis Fantasia B on: 01/08/2013 08:22 AM   Modules accepted: Orders

## 2013-01-08 NOTE — Telephone Encounter (Signed)
Patient is aware and would like referral to [facility] neuro

## 2013-01-09 ENCOUNTER — Other Ambulatory Visit: Payer: Self-pay | Admitting: Family Medicine

## 2013-01-09 DIAGNOSIS — M5090 Cervical disc disorder, unspecified, unspecified cervical region: Secondary | ICD-10-CM

## 2013-01-09 NOTE — Telephone Encounter (Signed)
Pl refer pt to [facility] neuro, per request, ensure they take her ins pls, for evaluation of uncontrolled neck pain, withn disc disease, referral has been entered

## 2013-01-19 LAB — COMPLETE METABOLIC PANEL WITH GFR
ALT: 14 U/L (ref 0–35)
AST: 11 U/L (ref 0–37)
Albumin: 4.7 g/dL (ref 3.5–5.2)
Alkaline Phosphatase: 104 U/L (ref 39–117)
BUN: 13 mg/dL (ref 6–23)
CO2: 28 mEq/L (ref 19–32)
Calcium: 9.8 mg/dL (ref 8.4–10.5)
Chloride: 98 mEq/L (ref 96–112)
Creat: 0.48 mg/dL — ABNORMAL LOW (ref 0.50–1.10)
GFR, Est African American: >89 mL/min
GFR, Est Non African American: >89 mL/min
Glucose, Bld: 180 mg/dL — ABNORMAL HIGH (ref 70–99)
Potassium: 4.6 mEq/L (ref 3.5–5.3)
Sodium: 139 mEq/L (ref 135–145)
Total Bilirubin: 0.5 mg/dL (ref 0.3–1.2)
Total Protein: 7.6 g/dL (ref 6.0–8.3)

## 2013-01-19 LAB — LIPID PANEL
Cholesterol: 192 mg/dL (ref 0–200)
HDL: 76 mg/dL (ref >39)
LDL Cholesterol: 100 mg/dL — ABNORMAL HIGH (ref 0–99)
Total CHOL/HDL Ratio: 2.5 Ratio
Triglycerides: 81 mg/dL (ref <150)
VLDL: 16 mg/dL (ref 0–40)

## 2013-01-19 LAB — HEMOGLOBIN A1C
Hgb A1c MFr Bld: 8.3 % — ABNORMAL HIGH (ref <5.7)
Mean Plasma Glucose: 192 mg/dL — ABNORMAL HIGH (ref <117)

## 2013-01-23 ENCOUNTER — Encounter: Payer: Self-pay | Admitting: Family Medicine

## 2013-01-23 ENCOUNTER — Encounter (INDEPENDENT_AMBULATORY_CARE_PROVIDER_SITE_OTHER): Payer: Self-pay

## 2013-01-23 ENCOUNTER — Ambulatory Visit (INDEPENDENT_AMBULATORY_CARE_PROVIDER_SITE_OTHER): Payer: BC Managed Care – PPO | Admitting: Family Medicine

## 2013-01-23 VITALS — BP 134/82 | HR 96 | Resp 16 | Ht 64.0 in | Wt 145.0 lb

## 2013-01-23 DIAGNOSIS — M542 Cervicalgia: Secondary | ICD-10-CM

## 2013-01-23 DIAGNOSIS — I10 Essential (primary) hypertension: Secondary | ICD-10-CM

## 2013-01-23 DIAGNOSIS — M546 Pain in thoracic spine: Secondary | ICD-10-CM

## 2013-01-23 DIAGNOSIS — Z9119 Patient's noncompliance with other medical treatment and regimen: Secondary | ICD-10-CM

## 2013-01-23 DIAGNOSIS — E785 Hyperlipidemia, unspecified: Secondary | ICD-10-CM

## 2013-01-23 DIAGNOSIS — Z13820 Encounter for screening for osteoporosis: Secondary | ICD-10-CM

## 2013-01-23 DIAGNOSIS — Z91199 Patient's noncompliance with other medical treatment and regimen due to unspecified reason: Secondary | ICD-10-CM

## 2013-01-23 DIAGNOSIS — E1065 Type 1 diabetes mellitus with hyperglycemia: Secondary | ICD-10-CM

## 2013-01-23 DIAGNOSIS — IMO0001 Reserved for inherently not codable concepts without codable children: Secondary | ICD-10-CM

## 2013-01-23 HISTORY — DX: Cervicalgia: M54.2

## 2013-01-23 MED ORDER — INSULIN GLARGINE 100 UNIT/ML SOLOSTAR PEN: PEN_INJECTOR | SUBCUTANEOUS | Status: DC

## 2013-01-23 MED ORDER — GABAPENTIN 100 MG PO CAPS: ORAL_CAPSULE | ORAL | Status: DC

## 2013-01-23 MED ORDER — KETOROLAC TROMETHAMINE 60 MG/2ML IM SOLN
60.0000 mg | Freq: Once | INTRAMUSCULAR | Status: AC
  Administered 2013-01-23: 60 mg via INTRAMUSCULAR

## 2013-01-23 NOTE — Patient Instructions (Addendum)
F/u in 3.5 month, call if you need me before   Please schedule and keep appt for flu vaccine you need this as well as your pneumonia vaccine and shingles vaccine  Increase lantus to 17 units daily, after 2 weeks if fasting sugars are still high may increase to 20 units as prescribed. Call with questions  New for nerve pain is gabapentin at bedtime, start with 1 and increase up to 3 , if needed  HBa1C, cmp and EGFr non fasting in 3.5 month  Please schedule your eye exam  You are  Referred for bone density test  Call neurosurgeon re  Further tests needed please   Consider the addition of antidepressant medication , which may also even help with your chronic pain, let me know if you decide you need medication please

## 2013-01-28 DIAGNOSIS — Z9119 Patient's noncompliance with other medical treatment and regimen: Secondary | ICD-10-CM | POA: Insufficient documentation

## 2013-01-28 DIAGNOSIS — Z91199 Patient's noncompliance with other medical treatment and regimen due to unspecified reason: Secondary | ICD-10-CM | POA: Insufficient documentation

## 2013-01-28 NOTE — Assessment & Plan Note (Signed)
Continues to refuse all immuniazation recommended despite education

## 2013-01-28 NOTE — Assessment & Plan Note (Signed)
Ongoing and uncontrollee, toradol at visit , and start gabapentin, pt to f/u with neurosurgery

## 2013-01-28 NOTE — Assessment & Plan Note (Signed)
Ongoing uncontrolled , needs to f/u with neurosurgery Trial of gabapentin

## 2013-01-28 NOTE — Assessment & Plan Note (Signed)
Improved though not at goal. No med change Hyperlipidemia:Low fat diet discussed and encouraged.

## 2013-01-28 NOTE — Assessment & Plan Note (Signed)
Uncontrolled. Dose increase in medication Patient advised to reduce carb and sweets, commit to regular physical activity, take meds as prescribed, test blood as directed, and attempt to lose weight, to improve blood sugar control.

## 2013-01-28 NOTE — Progress Notes (Signed)
  Subjective:    Patient ID: [name], female    DOB: 1950/11/28, 62 y.o.   MRN: [id number]  HPI The PT is here for follow up and re-evaluation of chronic medical conditions, medication management and review of any available recent lab and radiology data.  Preventive health is updated, specifically  Cancer screening and Immunization.  Refuses immunization once more, needs pap scheduled Questions or concerns regarding consultations or procedures which the PT has had in the interim are  Addressed.Saw neurosurgery, is supposed to have further imaging then f/u, still not sure of when the test is and the plan. Pt to call the office  The PT denies any adverse reactions to current medications since the last visit.  There are no new concerns.  C/o ongoing and uncontrolled right neck pain requests injection and med management in the interim Denies polyuria and polydipsia , states blood sugars fluctuate and FBG averaging 140 to 160   Review of Systems See HPI Denies recent fever or chills. Denies sinus pressure, nasal congestion, ear pain or sore throat. Denies chest congestion, productive cough or wheezing. Denies chest pains, palpitations and leg swelling Denies abdominal pain, nausea, vomiting,diarrhea or constipation.   Denies dysuria, frequency, hesitancy or incontinence. Denies headaches, seizures, has right upper extremity  numbness, or tingling. Denies depression, anxiety or insomnia. Denies skin break down or rash.        Objective:   Physical Exam  Patient alert and oriented and in no cardiopulmonary distress.  HEENT: No facial asymmetry, EOMI, no sinus tenderness,  oropharynx pink and moist.  Neck decreased ROM no adenopathy.  Chest: Clear to auscultation bilaterally.  CVS: S1, S2 no murmurs, no S3.  ABD: Soft non tender. Bowel sounds normal.  Ext: No edema  MS: Adequate ROM spine, shoulders, hips and knees.  Skin: Intact, no ulcerations or rash noted.  Psych:  Good eye contact, normal affect. Memory intact not anxious or depressed appearing.  CNS: CN 2-12 intact, , tone and sensation normal throughout.Decreased grip in right hand 3/5       Assessment & Plan:

## 2013-01-28 NOTE — Assessment & Plan Note (Signed)
Controlled, no change in medication DASH diet and commitment to daily physical activity for a minimum of 30 minutes discussed and encouraged, as a part of hypertension management. The importance of attaining a healthy weight is also discussed.  

## 2013-02-06 ENCOUNTER — Ambulatory Visit (HOSPITAL_COMMUNITY)
Admission: RE | Admit: 2013-02-06 | Discharge: 2013-02-06 | Disposition: A | Discharge status: Home / Self Care | Payer: BC Managed Care – PPO | Source: Ambulatory Visit | Attending: Family Medicine | Admitting: Family Medicine

## 2013-02-06 DIAGNOSIS — M899 Disorder of bone, unspecified: Secondary | ICD-10-CM | POA: Insufficient documentation

## 2013-02-06 DIAGNOSIS — Z13820 Encounter for screening for osteoporosis: Secondary | ICD-10-CM

## 2013-02-09 ENCOUNTER — Encounter (HOSPITAL_COMMUNITY): Payer: Self-pay | Admitting: Emergency Medicine

## 2013-02-09 ENCOUNTER — Emergency Department (HOSPITAL_COMMUNITY)
Admission: EM | Admit: 2013-02-09 | Discharge: 2013-02-09 | Disposition: A | Discharge status: Home / Self Care | Payer: BC Managed Care – PPO | Attending: Emergency Medicine | Admitting: Emergency Medicine

## 2013-02-09 DIAGNOSIS — Z8742 Personal history of other diseases of the female genital tract: Secondary | ICD-10-CM | POA: Insufficient documentation

## 2013-02-09 DIAGNOSIS — I10 Essential (primary) hypertension: Secondary | ICD-10-CM | POA: Insufficient documentation

## 2013-02-09 DIAGNOSIS — E785 Hyperlipidemia, unspecified: Secondary | ICD-10-CM | POA: Insufficient documentation

## 2013-02-09 DIAGNOSIS — R319 Hematuria, unspecified: Secondary | ICD-10-CM | POA: Insufficient documentation

## 2013-02-09 DIAGNOSIS — Z794 Long term (current) use of insulin: Secondary | ICD-10-CM | POA: Insufficient documentation

## 2013-02-09 DIAGNOSIS — E119 Type 2 diabetes mellitus without complications: Secondary | ICD-10-CM | POA: Insufficient documentation

## 2013-02-09 DIAGNOSIS — Z79899 Other long term (current) drug therapy: Secondary | ICD-10-CM | POA: Insufficient documentation

## 2013-02-09 LAB — URINALYSIS, ROUTINE W REFLEX MICROSCOPIC
Bilirubin Urine: NEGATIVE
Glucose, UA: NEGATIVE mg/dL
Ketones, ur: NEGATIVE mg/dL
Leukocytes, UA: NEGATIVE
Nitrite: NEGATIVE
Protein, ur: NEGATIVE mg/dL
Specific Gravity, Urine: 1.02 (ref 1.005–1.030)
Urobilinogen, UA: 0.2 mg/dL (ref 0.0–1.0)
pH: 6 (ref 5.0–8.0)

## 2013-02-09 LAB — COMPREHENSIVE METABOLIC PANEL
ALT: 13 U/L (ref 0–35)
AST: 13 U/L (ref 0–37)
Albumin: 4.7 g/dL (ref 3.5–5.2)
Alkaline Phosphatase: 126 U/L — ABNORMAL HIGH (ref 39–117)
BUN: 13 mg/dL (ref 6–23)
CO2: 27 mEq/L (ref 19–32)
Calcium: 9.9 mg/dL (ref 8.4–10.5)
Chloride: 98 mEq/L (ref 96–112)
Creatinine, Ser: 0.55 mg/dL (ref 0.50–1.10)
GFR calc Af Amer: >90 mL/min (ref >90)
GFR calc non Af Amer: >90 mL/min (ref >90)
Glucose, Bld: 152 mg/dL — ABNORMAL HIGH (ref 70–99)
Potassium: 4 mEq/L (ref 3.5–5.1)
Sodium: 137 mEq/L (ref 135–145)
Total Bilirubin: 0.3 mg/dL (ref 0.3–1.2)
Total Protein: 8.1 g/dL (ref 6.0–8.3)

## 2013-02-09 LAB — CBC WITH DIFFERENTIAL/PLATELET
Basophils Absolute: 0 10*3/uL (ref 0.0–0.1)
Basophils Relative: 0 % (ref 0–1)
Eosinophils Absolute: 0.1 10*3/uL (ref 0.0–0.7)
Eosinophils Relative: 1 % (ref 0–5)
HCT: 38.5 % (ref 36.0–46.0)
Hemoglobin: 12.4 g/dL (ref 12.0–15.0)
Lymphocytes Relative: 32 % (ref 12–46)
Lymphs Abs: 2 10*3/uL (ref 0.7–4.0)
MCH: 29.1 pg (ref 26.0–34.0)
MCHC: 32.2 g/dL (ref 30.0–36.0)
MCV: 90.4 fL (ref 78.0–100.0)
Monocytes Absolute: 0.3 10*3/uL (ref 0.1–1.0)
Monocytes Relative: 6 % (ref 3–12)
Neutro Abs: 3.7 10*3/uL (ref 1.7–7.7)
Neutrophils Relative %: 61 % (ref 43–77)
Platelets: 386 10*3/uL (ref 150–400)
RBC: 4.26 MIL/uL (ref 3.87–5.11)
RDW: 13.5 % (ref 11.5–15.5)
WBC: 6 10*3/uL (ref 4.0–10.5)

## 2013-02-09 LAB — URINE MICROSCOPIC-ADD ON

## 2013-02-09 NOTE — ED Notes (Signed)
Patient with no complaints at this time. Respirations even and unlabored. Skin warm/dry. Discharge instructions reviewed with patient at this time. Patient given opportunity to voice concerns/ask questions. Patient discharged at this time and left Emergency Department with steady gait.   

## 2013-02-09 NOTE — ED Notes (Signed)
Patient reports darkened urine w/out any other urinary symptoms.  States she recently started on Neurontin.

## 2013-02-09 NOTE — ED Notes (Signed)
Pt c/o dark urine today. Denies any other symptoms.

## 2013-02-09 NOTE — ED Provider Notes (Signed)
CSN: [id number]     Arrival date & time 02/09/13  2026 History  This chart was scribed for Celene Kras, MD by Ardelia Mems, ED Scribe. This patient was seen in room APA12/APA12 and the patient's care was started at 9:13 PM.   Chief Complaint  Patient presents with  . dark urine     The history is provided by the patient. No language interpreter was used.    HPI Comments: [name] is a 62 y.o. female with a history of DM, HLD and HTN who presents to the Emergency Department complaining of dark urine noticed today. She states that she recently started taking Gabapentin, and that she was told that dark urine may be a side effect of this medication. She denies any recent dietary changes, and states that she has been keeping herself hydrated. She denies any recent illnesses. She states that she has no other complaints or symptoms.  PCP- Dr. Syliva Overman   Past Medical History  Diagnosis Date  . Headache(784.0)   . Vaginitis   . Hyperlipidemia   . Hypertension   . Diabetes mellitus without complication    Past Surgical History  Procedure Laterality Date  . Total abdominal hysterectomy w/ bilateral salpingoophorectomy  1998   Family History  Problem Relation Age of Onset  . Diabetes Sister   . Hypertension Sister   . Esophageal cancer Father   . Seizures Brother    History  Substance Use Topics  . Smoking status: Never Smoker   . Smokeless tobacco: Not on file  . Alcohol Use: No   OB History   Grav Para Term Preterm Abortions TAB SAB Ect Mult Living                 Review of Systems  Gastrointestinal: Negative for abdominal pain.  Genitourinary: Negative for dysuria, urgency, flank pain and decreased urine volume.       Dark urine.  All other systems reviewed and are negative.   Allergies  Onglyza and Ace inhibitors  Home Medications   Current Outpatient Rx  Name  Route  Sig  Dispense  Refill  . acetaminophen (TYLENOL) 500 MG tablet   Oral   Take  1,000 mg by mouth every 6 (six) hours as needed.         Marland Kitchen amLODipine (NORVASC) 5 MG tablet   Oral   Take 1 tablet (5 mg total) by mouth daily.   90 tablet   3     Discontinue moexepril effective 04/24/2012   . gabapentin (NEURONTIN) 100 MG capsule      Three capsules at bedtime for right upper extremity nerve pain   30 capsule   4   . glyBURIDE-metformin (GLUCOVANCE) 5-500 MG per tablet   Oral   Take 2 tablets by mouth 2 (two) times daily with a meal.   360 tablet   2   . Insulin Glargine (LANTUS SOLOSTAR) 100 UNIT/ML SOPN   Subcutaneous   Inject 16 Units into the skin every evening.         . pravastatin (PRAVACHOL) 20 MG tablet   Oral   Take 1 tablet (20 mg total) by mouth every evening.   90 tablet   2    Triage Vitals: BP 150/84  Pulse 99  Temp(Src) 98.3 F (36.8 C) (Oral)  Resp 16  Ht 5\' 4"  (1.626 m)  Wt 143 lb (64.864 kg)  BMI 24.53 kg/m2  SpO2 100%  Physical Exam  Nursing note  and vitals reviewed. Constitutional: She appears well-developed and well-nourished. No distress.  HENT:  Head: Normocephalic and atraumatic.  Right Ear: External ear normal.  Left Ear: External ear normal.  Eyes: Conjunctivae are normal. Right eye exhibits no discharge. Left eye exhibits no discharge. No scleral icterus.  Neck: Neck supple. No tracheal deviation present.  Cardiovascular: Normal rate, regular rhythm and intact distal pulses.   Pulmonary/Chest: Effort normal and breath sounds normal. No stridor. No respiratory distress. She has no wheezes. She has no rales.  Abdominal: Soft. Bowel sounds are normal. She exhibits no distension. There is no tenderness. There is no rebound and no guarding.  Musculoskeletal: She exhibits no edema and no tenderness.  Neurological: She is alert. She has normal strength. No sensory deficit. Cranial nerve deficit:  no gross defecits noted. She exhibits normal muscle tone. She displays no seizure activity. Coordination normal.  Skin:  Skin is warm and dry. No rash noted.  Psychiatric: She has a normal mood and affect.    ED Course  Procedures (including critical care time)  DIAGNOSTIC STUDIES: Oxygen Saturation is 100% on RA, normal by my interpretation.    COORDINATION OF CARE: 9:17 PM- Discussed plan to obtain diagnostic lab work, including UA. Pt advised of plan for treatment and pt agrees.  Labs Review Labs Reviewed  COMPREHENSIVE METABOLIC PANEL - Abnormal; Notable for the following:    Glucose, Bld 152 (*)    Alkaline Phosphatase 126 (*)    All other components within normal limits  URINALYSIS, ROUTINE W REFLEX MICROSCOPIC - Abnormal; Notable for the following:    Color, Urine RED (*)    APPearance CLOUDY (*)    Hgb urine dipstick LARGE (*)    All other components within normal limits  URINE CULTURE  CBC WITH DIFFERENTIAL  URINE MICROSCOPIC-ADD ON   Imaging Review No results found.  EKG Interpretation   None       MDM   1. Hematuria    Pt has painless hematuria.  Will refer back to her PCP and urology for further evaluation.  Stressed importance of follow up.    I personally performed the services described in this documentation, which was scribed in my presence.  The recorded information has been reviewed and is accurate.   Celene Kras, MD 02/09/13 [phone]

## 2013-02-12 ENCOUNTER — Other Ambulatory Visit (HOSPITAL_COMMUNITY): Payer: Self-pay | Admitting: Urology

## 2013-02-12 DIAGNOSIS — R310 Gross hematuria: Secondary | ICD-10-CM

## 2013-02-14 ENCOUNTER — Other Ambulatory Visit: Payer: Self-pay | Admitting: Family Medicine

## 2013-02-14 ENCOUNTER — Telehealth: Payer: Self-pay | Admitting: Family Medicine

## 2013-02-14 DIAGNOSIS — R319 Hematuria, unspecified: Secondary | ICD-10-CM

## 2013-02-14 NOTE — Telephone Encounter (Signed)
Pls let pt know I am aware she was recently in The ED for blood in the urine and she needs to be evaluated by a urologist. Ed paper stated dr Jerre Simon (I guess he was on call. If she has a preference for any specific Doc I will also suggest she try to get appt with [facility] here in [place], i will refer to whichever office she wants pls let her advise referral staff or you which she does I am entering a "generic" referral

## 2013-02-15 ENCOUNTER — Telehealth: Payer: Self-pay | Admitting: Family Medicine

## 2013-02-15 ENCOUNTER — Ambulatory Visit (HOSPITAL_COMMUNITY): Payer: BC Managed Care – PPO

## 2013-02-15 NOTE — Telephone Encounter (Signed)
I noticed that, thanks

## 2013-02-15 NOTE — Telephone Encounter (Signed)
Patient has seen Dr. Jerre Simon on 12.8.2014 she stated

## 2013-02-15 NOTE — Telephone Encounter (Signed)
Called patient and left message for them to return call at the office   

## 2013-02-17 ENCOUNTER — Other Ambulatory Visit: Payer: Self-pay | Admitting: Family Medicine

## 2013-02-21 ENCOUNTER — Other Ambulatory Visit: Payer: Self-pay | Admitting: Family Medicine

## 2013-03-26 ENCOUNTER — Telehealth: Payer: Self-pay

## 2013-03-26 NOTE — Telephone Encounter (Signed)
Work in appt for am

## 2013-03-26 NOTE — Telephone Encounter (Signed)
Walked into office and stated she feels like she is going to pass out. Then after I got her back to the room she said she didn't feel like that but she felt very weak and somewhat off balance. She hasn't had an appetite recently and she was sick of her stomach and vomited several times yesterday evening. Thinks she could possibly be dehydrated. Want to order labs?

## 2013-03-26 NOTE — Telephone Encounter (Signed)
Patient states that she has had URI symptoms of cough, congestion, and fatigue x 5 days.  Believes dizziness and fatigue are from lack of eating and drinking.  VS as follows 99.0, 100 hr, 97%, 130/78, 141 lbs.   Please advise.

## 2013-03-27 ENCOUNTER — Encounter: Payer: Self-pay | Admitting: Family Medicine

## 2013-03-27 ENCOUNTER — Ambulatory Visit (INDEPENDENT_AMBULATORY_CARE_PROVIDER_SITE_OTHER): Payer: BC Managed Care – PPO | Admitting: Family Medicine

## 2013-03-27 ENCOUNTER — Encounter (INDEPENDENT_AMBULATORY_CARE_PROVIDER_SITE_OTHER): Payer: Self-pay

## 2013-03-27 VITALS — BP 120/80 | HR 90 | Resp 16 | Ht 64.0 in | Wt 141.0 lb

## 2013-03-27 DIAGNOSIS — J209 Acute bronchitis, unspecified: Secondary | ICD-10-CM

## 2013-03-27 DIAGNOSIS — I10 Essential (primary) hypertension: Secondary | ICD-10-CM

## 2013-03-27 DIAGNOSIS — L723 Sebaceous cyst: Secondary | ICD-10-CM

## 2013-03-27 DIAGNOSIS — E1065 Type 1 diabetes mellitus with hyperglycemia: Secondary | ICD-10-CM

## 2013-03-27 DIAGNOSIS — Z794 Long term (current) use of insulin: Secondary | ICD-10-CM

## 2013-03-27 DIAGNOSIS — A499 Bacterial infection, unspecified: Secondary | ICD-10-CM

## 2013-03-27 DIAGNOSIS — N3000 Acute cystitis without hematuria: Secondary | ICD-10-CM

## 2013-03-27 DIAGNOSIS — B9689 Other specified bacterial agents as the cause of diseases classified elsewhere: Secondary | ICD-10-CM | POA: Insufficient documentation

## 2013-03-27 DIAGNOSIS — IMO0001 Reserved for inherently not codable concepts without codable children: Secondary | ICD-10-CM

## 2013-03-27 DIAGNOSIS — B001 Herpesviral vesicular dermatitis: Secondary | ICD-10-CM | POA: Insufficient documentation

## 2013-03-27 DIAGNOSIS — J208 Acute bronchitis due to other specified organisms: Principal | ICD-10-CM

## 2013-03-27 DIAGNOSIS — E1165 Type 2 diabetes mellitus with hyperglycemia: Secondary | ICD-10-CM

## 2013-03-27 DIAGNOSIS — J019 Acute sinusitis, unspecified: Secondary | ICD-10-CM

## 2013-03-27 DIAGNOSIS — IMO0002 Reserved for concepts with insufficient information to code with codable children: Secondary | ICD-10-CM

## 2013-03-27 DIAGNOSIS — B009 Herpesviral infection, unspecified: Secondary | ICD-10-CM

## 2013-03-27 DIAGNOSIS — E785 Hyperlipidemia, unspecified: Secondary | ICD-10-CM

## 2013-03-27 LAB — POCT URINALYSIS DIPSTICK
Bilirubin, UA: NEGATIVE
Glucose, UA: NEGATIVE
Ketones, UA: NEGATIVE
Leukocytes, UA: NEGATIVE
Nitrite, UA: NEGATIVE
Spec Grav, UA: 1.02
Urobilinogen, UA: 0.2
pH, UA: 7

## 2013-03-27 MED ORDER — BENZONATATE 100 MG PO CAPS: 100.0000 mg | ORAL_CAPSULE | Freq: Three times a day (TID) | ORAL | Status: DC | PRN

## 2013-03-27 MED ORDER — ACYCLOVIR 400 MG PO TABS: 400.0000 mg | ORAL_TABLET | Freq: Three times a day (TID) | ORAL | Status: DC

## 2013-03-27 MED ORDER — ACYCLOVIR 400 MG PO TABS: 400.0000 mg | ORAL_TABLET | Freq: Every day | ORAL | Status: DC

## 2013-03-27 MED ORDER — LEVOFLOXACIN 500 MG PO TABS: 500.0000 mg | ORAL_TABLET | Freq: Every day | ORAL | Status: AC

## 2013-03-27 MED ORDER — ACYCLOVIR 400 MG PO TABS
400.0000 mg | ORAL_TABLET | Freq: Three times a day (TID) | ORAL | Status: AC
Start: 2013-03-27 — End: 2013-04-06

## 2013-03-27 NOTE — Patient Instructions (Addendum)
F/u in march as before   You will be prescribed a 1 week course of antibiotic for continued drainage and swelling of the cyst on your abdomen, this will also treat any infection in your lungs and  sinuses  You need the cyst on your abdomen to re evaluated, return to the dermatologist for it to be re checked it is still EXTREMELY large.  Urine is being checked for infection, no sign of infection  Decongestant pills are prescribed   Medication is sent in for the "cold  Sores"  Please get labs before March visist as ordered

## 2013-03-27 NOTE — Progress Notes (Signed)
   Subjective:    Patient ID: [name], female    DOB: 01/19/1951, 63 y.o.   MRN: [id number]  HPI Pt reports that since Jan 9 she started with upper respiratory symptoms, runny nose , scratchy throat and 3 days later experienced extreme fatigue with fever and chills, bad taste in mouth , poor appetite , chest congestion and productive cough.  Yesterday walked into office reporting imbalance, feels as though improving but still has yellowish sputum. States she started having flare of her cold sores last week when ill Reports that 2 days ago she also developed lower pelvic pressure and vomiting up to 5 times , is aware of having kidney stones Had boil treated  by dermatology in December with antibiotic only, still a big swelling present   Review of Systems See HPI  Denies chest pains, palpitations and leg swelling Denies abdominal pain, nausea, vomiting,diarrhea or constipation.   1 day h/o pelvic pressure, urinary frequency and dysuria, no visible blood in urine Denies joint pain, swelling and limitation in mobility. Denies headaches, seizures, numbness, or tingling. Denies depression, anxiety or insomnia. Denies skin break down or rash.        Objective:   Physical Exam  BP 120/80  Pulse 90  Resp 16  Ht 5\' 4"  (1.626 m)  Wt 141 lb (63.957 kg)  BMI 24.19 kg/m2  SpO2 97% Patient alert and oriented and in no cardiopulmonary distress.  HEENT: No facial asymmetry, EOMI, frontal sinus tenderness,  oropharynx pink and moist.  Neck supple no adenopathy.TM clear  Chest: decreased air entry, scattered crackles no wheezes  CVS: S1, S2 no murmurs, no S3.  ABD: Soft tender over infected cyst. Bowel sounds normal.  Ext: No edema  MS: Adequate ROM spine, shoulders, hips and knees.  Skin: Iinfected cyst with purulent drainage, max dia approx 2.5 inches and deep  Psych: Good eye contact, normal affect. Memory intact not anxious or depressed appearing.  CNS: CN 2-12 intact,  power, tone and sensation normal throughout.       Assessment & Plan:  SEBACEOUS CYST Infected cyst on abdomen max diameter approx 2 inches, drainaing purulent fluid, was initially treated by dermatology several weeks ago, never opened fully  Acute bacterial bronchitis Antibiotic and decongestant prescribed.    Acute sinusitis Antibiotics and decongestants prescribed medication also administered at office visit.    Fever blister Antiviral medication prescribed  HYPERTENSION Controlled, no change in medication DASH diet and commitment to daily physical activity for a minimum of 30 minutes discussed and encouraged, as a part of hypertension management. The importance of attaining a healthy weight is also discussed.   Diabetes mellitus, insulin dependent (IDDM), uncontrolled Uncontrolled Updated lab needed at/ before next visit. Patient advised to reduce carb and sweets, commit to regular physical activity, take meds as prescribed, test blood as directed, and attempt to lose weight, to improve blood sugar control.   HYPERLIPIDEMIA Improved, nearly at goal No med change Hyperlipidemia:Low fat diet discussed and encouraged.    Acute cystitis Symptomatic, however negative urinalysis

## 2013-04-25 DIAGNOSIS — J019 Acute sinusitis, unspecified: Secondary | ICD-10-CM | POA: Insufficient documentation

## 2013-04-25 NOTE — Assessment & Plan Note (Signed)
Uncontrolled Updated lab needed at/ before next visit. Patient advised to reduce carb and sweets, commit to regular physical activity, take meds as prescribed, test blood as directed, and attempt to lose weight, to improve blood sugar control.

## 2013-04-25 NOTE — Assessment & Plan Note (Signed)
Controlled, no change in medication DASH diet and commitment to daily physical activity for a minimum of 30 minutes discussed and encouraged, as a part of hypertension management. The importance of attaining a healthy weight is also discussed.  

## 2013-04-25 NOTE — Assessment & Plan Note (Signed)
Antibiotics and decongestants prescribed medication also administered at office visit.    

## 2013-04-25 NOTE — Assessment & Plan Note (Signed)
Improved, nearly at goal No med change Hyperlipidemia:Low fat diet discussed and encouraged.

## 2013-04-25 NOTE — Assessment & Plan Note (Signed)
Symptomatic, however negative urinalysis

## 2013-04-25 NOTE — Assessment & Plan Note (Signed)
Infected cyst on abdomen max diameter approx 2 inches, drainaing purulent fluid, was initially treated by dermatology several weeks ago, never opened fully

## 2013-04-25 NOTE — Assessment & Plan Note (Signed)
Antibiotic and decongestant prescribed 

## 2013-04-25 NOTE — Assessment & Plan Note (Signed)
Antiviral medication prescribed

## 2013-05-12 LAB — COMPLETE METABOLIC PANEL WITH GFR
ALT: 13 U/L (ref 0–35)
AST: 12 U/L (ref 0–37)
Albumin: 4.5 g/dL (ref 3.5–5.2)
Alkaline Phosphatase: 105 U/L (ref 39–117)
BUN: 16 mg/dL (ref 6–23)
CO2: 30 mEq/L (ref 19–32)
Calcium: 10.5 mg/dL (ref 8.4–10.5)
Chloride: 100 mEq/L (ref 96–112)
Creat: 0.52 mg/dL (ref 0.50–1.10)
GFR, Est African American: >89 mL/min
GFR, Est Non African American: >89 mL/min
Glucose, Bld: 110 mg/dL — ABNORMAL HIGH (ref 70–99)
Potassium: 4.1 mEq/L (ref 3.5–5.3)
Sodium: 140 mEq/L (ref 135–145)
Total Bilirubin: 0.5 mg/dL (ref 0.2–1.2)
Total Protein: 7.2 g/dL (ref 6.0–8.3)

## 2013-05-12 LAB — HEMOGLOBIN A1C
Hgb A1c MFr Bld: 7.5 % — ABNORMAL HIGH (ref <5.7)
Mean Plasma Glucose: 169 mg/dL — ABNORMAL HIGH (ref <117)

## 2013-05-14 ENCOUNTER — Encounter: Payer: Self-pay | Admitting: Family Medicine

## 2013-05-14 ENCOUNTER — Encounter (INDEPENDENT_AMBULATORY_CARE_PROVIDER_SITE_OTHER): Payer: Self-pay

## 2013-05-14 ENCOUNTER — Ambulatory Visit (INDEPENDENT_AMBULATORY_CARE_PROVIDER_SITE_OTHER): Payer: BC Managed Care – PPO | Admitting: Family Medicine

## 2013-05-14 ENCOUNTER — Other Ambulatory Visit: Payer: Self-pay

## 2013-05-14 VITALS — BP 146/90 | HR 97 | Resp 16 | Wt 142.1 lb

## 2013-05-14 DIAGNOSIS — Z794 Long term (current) use of insulin: Secondary | ICD-10-CM

## 2013-05-14 DIAGNOSIS — M542 Cervicalgia: Secondary | ICD-10-CM

## 2013-05-14 DIAGNOSIS — E1065 Type 1 diabetes mellitus with hyperglycemia: Secondary | ICD-10-CM

## 2013-05-14 DIAGNOSIS — I10 Essential (primary) hypertension: Secondary | ICD-10-CM

## 2013-05-14 DIAGNOSIS — IMO0001 Reserved for inherently not codable concepts without codable children: Secondary | ICD-10-CM

## 2013-05-14 DIAGNOSIS — IMO0002 Reserved for concepts with insufficient information to code with codable children: Secondary | ICD-10-CM

## 2013-05-14 DIAGNOSIS — L723 Sebaceous cyst: Secondary | ICD-10-CM

## 2013-05-14 DIAGNOSIS — E1165 Type 2 diabetes mellitus with hyperglycemia: Secondary | ICD-10-CM

## 2013-05-14 DIAGNOSIS — E785 Hyperlipidemia, unspecified: Secondary | ICD-10-CM

## 2013-05-14 DIAGNOSIS — Z9119 Patient's noncompliance with other medical treatment and regimen: Secondary | ICD-10-CM

## 2013-05-14 DIAGNOSIS — Z91199 Patient's noncompliance with other medical treatment and regimen due to unspecified reason: Secondary | ICD-10-CM

## 2013-05-14 MED ORDER — PRAVASTATIN SODIUM 20 MG PO TABS: 20.0000 mg | ORAL_TABLET | Freq: Every evening | ORAL | Status: DC

## 2013-05-14 MED ORDER — AMLODIPINE BESYLATE 10 MG PO TABS: 10.0000 mg | ORAL_TABLET | Freq: Every day | ORAL | Status: DC

## 2013-05-14 NOTE — Assessment & Plan Note (Signed)
Improved , no surgical intervention at this time, now haslower ext stinging intermittently, no imaging at this time

## 2013-05-14 NOTE — Assessment & Plan Note (Signed)
Still non compliant with immunization schedule as well as eye exam

## 2013-05-14 NOTE — Assessment & Plan Note (Signed)
Improved, HBA1C is 7.5 Patient advised to reduce carb and sweets, commit to regular physical activity, take meds as prescribed, test blood as directed, and attempt to lose weight, to improve blood sugar control.

## 2013-05-14 NOTE — Assessment & Plan Note (Signed)
Hyperlipidemia:Low fat diet discussed and encouraged.  Updated lab needed at/ before next visit.  

## 2013-05-14 NOTE — Assessment & Plan Note (Signed)
Uncontrolled, inc amlodipine to 10 mg daily DASH diet and commitment to daily physical activity for a minimum of 30 minutes discussed and encouraged, as a part of hypertension management. The importance of attaining a healthy weight is also discussed.

## 2013-05-14 NOTE — Progress Notes (Signed)
   Subjective:    Patient ID: [name], female    DOB: [birth year], 63 y.o.   MRN: [id number]  HPI The PT is here for follow up and re-evaluation of chronic medical conditions, medication management and review of any available recent lab and radiology data.  Preventive health is updated, specifically  Cancer screening and Immunization.   Went to neurosurgery, was to have had f/u but never got a call back, states neck better, now experiencing lower ext tingling pains intermittently. Has been treated twice with oral antibiotics by dermatology for recurrent sebaceous cysts, now both healed, abdomen and back. The PT denies any adverse reactions to current medications since the last visit.  Has started calcium for bones, needs to commit to exercise    Review of Systems See HPI Denies recent fever or chills. Denies sinus pressure, nasal congestion, ear pain or sore throat. Denies chest congestion, productive cough or wheezing. Denies chest pains, palpitations and leg swelling Denies abdominal pain, nausea, vomiting,diarrhea or constipation.   Denies dysuria, frequency, hesitancy or incontinence. Generalized joint pains with reduced motion in right shoulder Denies headaches, seizures, Denies depression, anxiety or insomnia.       Objective:   Physical Exam BP 146/90  Pulse 97  Resp 16  Wt 142 lb 1.9 oz (64.465 kg)  SpO2 98% Patient alert and oriented and in no cardiopulmonary distress.  HEENT: No facial asymmetry, EOMI, no sinus tenderness,  oropharynx pink and moist.  Neck supple no adenopathy.  Chest: Clear to auscultation bilaterally.  CVS: S1, S2 no murmurs, no S3.  ABD: Soft non tender. Bowel sounds normal.  Ext: No edema  MS: Adequate ROM spine, shoulders, hips and knees.  Skin: Intact, no ulcerations or rash noted.  Psych: Good eye contact, normal affect. Memory intact not anxious or depressed appearing.  CNS: CN 2-12 intact, power, tone and sensation  normal throughout.        Assessment & Plan:  HYPERTENSION Uncontrolled, inc amlodipine to 10 mg daily DASH diet and commitment to daily physical activity for a minimum of 30 minutes discussed and encouraged, as a part of hypertension management. The importance of attaining a healthy weight is also discussed.   Diabetes mellitus, insulin dependent (IDDM), uncontrolled Improved, HBA1C is 7.5 Patient advised to reduce carb and sweets, commit to regular physical activity, take meds as prescribed, test blood as directed, and attempt to lose weight, to improve blood sugar control.   SEBACEOUS CYST Healed cysts on anterior abdomen also on upper back  Neck pain on right side Improved , no surgical intervention at this time, now haslower ext stinging intermittently, no imaging at this time  HYPERLIPIDEMIA Hyperlipidemia:Low fat diet discussed and encouraged.  Updated lab needed at/ before next visit.   Personal history of noncompliance with medical treatment, presenting hazards to health Still non compliant with immunization schedule as well as eye exam

## 2013-05-14 NOTE — Assessment & Plan Note (Signed)
Healed cysts on anterior abdomen also on upper back

## 2013-05-14 NOTE — Patient Instructions (Signed)
CPE in 3.5 month, call if you need me before  Blood sugar is improved, congrats, keep it up , no changes in medication  Blood pressure is too high, increase amlodipine to 86m daily, OK to take TWO 5 mg tablets once daily till done  PLEASE schedule and keep appointment for eye exam , this is very important  Check , you need the shingles vaccine  Fasting lipid, cmp and EGFR and HBA1C in 3.5 month

## 2013-05-29 ENCOUNTER — Other Ambulatory Visit (HOSPITAL_COMMUNITY): Payer: Self-pay | Admitting: Urology

## 2013-05-29 DIAGNOSIS — R310 Gross hematuria: Secondary | ICD-10-CM

## 2013-05-29 DIAGNOSIS — N390 Urinary tract infection, site not specified: Secondary | ICD-10-CM

## 2013-05-31 ENCOUNTER — Ambulatory Visit (HOSPITAL_COMMUNITY): Payer: BC Managed Care – PPO

## 2013-06-12 ENCOUNTER — Other Ambulatory Visit: Payer: Self-pay

## 2013-06-12 MED ORDER — GLYBURIDE-METFORMIN 5-500 MG PO TABS: ORAL_TABLET | ORAL | Status: DC

## 2013-06-19 ENCOUNTER — Other Ambulatory Visit: Payer: Self-pay

## 2013-06-19 MED ORDER — GLYBURIDE-METFORMIN 5-500 MG PO TABS: ORAL_TABLET | ORAL | Status: DC

## 2013-07-03 ENCOUNTER — Other Ambulatory Visit: Payer: Self-pay | Admitting: Family Medicine

## 2013-07-03 DIAGNOSIS — N63 Unspecified lump in unspecified breast: Secondary | ICD-10-CM

## 2013-07-19 ENCOUNTER — Other Ambulatory Visit: Payer: Self-pay

## 2013-07-19 MED ORDER — INSULIN GLARGINE 100 UNIT/ML SOLOSTAR PEN: 16.0000 [IU] | PEN_INJECTOR | Freq: Every evening | SUBCUTANEOUS | Status: DC

## 2013-07-27 ENCOUNTER — Telehealth: Payer: Self-pay

## 2013-07-27 NOTE — Telephone Encounter (Signed)
Noted and agree. 

## 2013-07-27 NOTE — Telephone Encounter (Signed)
Advised to use anti-inflammatories x 3 days and call if problem persists.

## 2013-07-31 ENCOUNTER — Encounter: Payer: Self-pay | Admitting: Family Medicine

## 2013-07-31 ENCOUNTER — Ambulatory Visit (INDEPENDENT_AMBULATORY_CARE_PROVIDER_SITE_OTHER): Payer: BC Managed Care – PPO | Admitting: Family Medicine

## 2013-07-31 VITALS — BP 124/76 | HR 92 | Resp 18 | Ht 64.0 in | Wt 143.0 lb

## 2013-07-31 DIAGNOSIS — E049 Nontoxic goiter, unspecified: Secondary | ICD-10-CM

## 2013-07-31 DIAGNOSIS — I10 Essential (primary) hypertension: Secondary | ICD-10-CM

## 2013-07-31 NOTE — Patient Instructions (Signed)
CPE July 9 at 8am as before  You are referred for an [place] of neck to evaluate left sided swelling  Fasting labs July 5 or after  Be prepared to get zostavaz next visit, check your ins coverage

## 2013-08-02 ENCOUNTER — Ambulatory Visit (HOSPITAL_COMMUNITY)
Admission: RE | Admit: 2013-08-02 | Discharge: 2013-08-02 | Disposition: A | Discharge status: Home / Self Care | Payer: BC Managed Care – PPO | Source: Ambulatory Visit | Attending: Family Medicine | Admitting: Family Medicine

## 2013-08-02 DIAGNOSIS — E049 Nontoxic goiter, unspecified: Secondary | ICD-10-CM

## 2013-08-02 DIAGNOSIS — E041 Nontoxic single thyroid nodule: Secondary | ICD-10-CM | POA: Insufficient documentation

## 2013-08-08 ENCOUNTER — Telehealth: Payer: Self-pay

## 2013-08-08 DIAGNOSIS — E049 Nontoxic goiter, unspecified: Secondary | ICD-10-CM

## 2013-08-08 NOTE — Telephone Encounter (Signed)
Patient referred to Dr. Benjamine Mola for goiter/cyst to left thyroid

## 2013-08-09 ENCOUNTER — Ambulatory Visit (INDEPENDENT_AMBULATORY_CARE_PROVIDER_SITE_OTHER): Payer: BC Managed Care – PPO | Admitting: Otolaryngology

## 2013-08-09 DIAGNOSIS — D449 Neoplasm of uncertain behavior of unspecified endocrine gland: Secondary | ICD-10-CM

## 2013-08-10 ENCOUNTER — Other Ambulatory Visit (INDEPENDENT_AMBULATORY_CARE_PROVIDER_SITE_OTHER): Payer: Self-pay | Admitting: Otolaryngology

## 2013-08-10 DIAGNOSIS — E041 Nontoxic single thyroid nodule: Secondary | ICD-10-CM

## 2013-08-12 NOTE — Assessment & Plan Note (Signed)
Mass on left side of neck, likely goiter or cyst on thyoid, needs imaging then ENT eval

## 2013-08-12 NOTE — Assessment & Plan Note (Signed)
Controlled, no change in medication  

## 2013-08-12 NOTE — Progress Notes (Signed)
   Subjective:    Patient ID: [name], female    DOB: [birth year], 63 y.o.   MRN: [id number]  HPI 5 day h/o mildly tender left neck mass, no fever , chills, nasal drainage or facial pressure No ither concerns at this visit   Review of Systems See HPI Denies recent fever or chills. Denies sinus pressure, nasal congestion, ear pain or sore throat. Denies chest congestion, productive cough or wheezing. Denies chest pains, palpitations and leg swelling  Denies depression, anxiety or insomnia. Denies skin break down or rash.        Objective:   Physical Exam BP 124/76  Pulse 92  Resp 18  Ht 5\' 4"  (1.626 m)  Wt 143 lb (64.864 kg)  BMI 24.53 kg/m2  SpO2 97% Patient alert and oriented and in no cardiopulmonary distress.  HEENT: No facial asymmetry, EOMI,   oropharynx pink and moist.  Neck supple no JVD,left neck mass, no JVD, no adenopathy Chest: Clear to auscultation bilaterally.  CVS: S1, S2 no murmurs, no S3.  ABD: Soft non tender.   Ext: No edema    CNS: CN 2-12 intact, power,  normal throughout.no focal deficits noted.       Assessment & Plan:  Goiter Mass on left side of neck, likely goiter or cyst on thyoid, needs imaging then ENT eval  HYPERTENSION Controlled, no change in medication

## 2013-08-16 ENCOUNTER — Other Ambulatory Visit (INDEPENDENT_AMBULATORY_CARE_PROVIDER_SITE_OTHER): Payer: Self-pay | Admitting: Otolaryngology

## 2013-08-16 ENCOUNTER — Ambulatory Visit (HOSPITAL_COMMUNITY)
Admission: RE | Admit: 2013-08-16 | Discharge: 2013-08-16 | Disposition: A | Discharge status: Home / Self Care | Payer: BC Managed Care – PPO | Source: Ambulatory Visit | Attending: Otolaryngology | Admitting: Otolaryngology

## 2013-08-16 ENCOUNTER — Encounter (HOSPITAL_COMMUNITY): Payer: Self-pay

## 2013-08-16 DIAGNOSIS — E041 Nontoxic single thyroid nodule: Secondary | ICD-10-CM

## 2013-08-16 MED ORDER — LIDOCAINE HCL (PF) 2 % IJ SOLN
INTRAMUSCULAR | Status: AC
  Administered 2013-08-16: 10 mL
  Filled 2013-08-16: qty 30

## 2013-08-16 MED ORDER — LIDOCAINE HCL (PF) 2 % IJ SOLN
10.0000 mL | Freq: Once | INTRAMUSCULAR | Status: AC
  Administered 2013-08-16: 10 mL

## 2013-08-16 NOTE — Discharge Instructions (Signed)
Thyroid Biopsy °The thyroid gland is a butterfly-shaped gland situated in the front of the neck. It produces hormones which affect metabolism, growth and development, and body temperature. A thyroid biopsy is a procedure in which small samples of tissue or fluid are removed from the thyroid gland or mass and examined under a microscope. This test is done to determine the cause of thyroid problems, such as infection, cancer, or other thyroid problems. °There are 2 ways to obtain samples: °1. Fine needle biopsy. Samples are removed using a thin needle inserted through the skin and into the thyroid gland or mass. °2. Open biopsy. Samples are removed after a cut (incision) is made through the skin. °LET YOUR CAREGIVER KNOW ABOUT:  °· Allergies. °· Medications taken including herbs, eye drops, over-the-counter medications, and creams. °· Use of steroids (by mouth or creams). °· Previous problems with anesthetics or numbing medicine. °· Possibility of pregnancy, if this applies. °· History of blood clots (thrombophlebitis). °· History of bleeding or blood problems. °· Previous surgery. °· Other health problems. °RISKS AND COMPLICATIONS °· Bleeding from the site. The risk of bleeding is higher if you have a bleeding disorder or are taking any blood thinning medications (anticoagulants). °· Infection. °· Injury to structures near the thyroid gland. °BEFORE THE PROCEDURE  °This is a procedure that can be done as an outpatient. Confirm the time that you need to arrive for your procedure. Confirm whether there is a need to fast or withhold any medications. A blood sample may be done to determine your blood clotting time. Medicine may be given to help you relax (sedative). °PROCEDURE °Fine needle biopsy. °You will be awake during the procedure. You may be asked to lie on your back with your head tipped backward to extend your neck. Let your caregiver know if you cannot tolerate the positioning. An area on your neck will be  cleansed. A needle is inserted through the skin of your neck. You may feel a mild discomfort during this procedure. You may be asked to avoid coughing, talking, swallowing, or making sounds during some portions of the procedure. The needle is withdrawn once tissue or fluid samples have been removed. Pressure may be applied to the neck to reduce swelling and ensure that bleeding has stopped. The samples will be sent for examination.  °Open biopsy. °You will be given general anesthesia. You will be asleep during the procedure. An incision is made in your neck. A sample of thyroid tissue or the mass is removed. The tissue sample or mass will be sent for examination. The sample or mass may be examined during the biopsy. If the sample or mass contains cancer cells, some or all of the thyroid gland may be removed. The incision is closed with stitches. °AFTER THE PROCEDURE  °Your recovery will be assessed and monitored. If there are no problems, as an outpatient, you should be able to go home shortly after the procedure. °If you had a fine needle biopsy: °· You may have soreness at the biopsy site for 1 to 2 days. °If you had an open biopsy:  °· You may have soreness at the biopsy site for 3 to 4 days. °· You may have a hoarse voice or sore throat for 1 to 2 days. °Obtaining the Test Results °It is your responsibility to obtain your test results. Do not assume everything is normal if you have not heard from your caregiver or the medical facility. It is important for you to follow up   on all of your test results. °HOME CARE INSTRUCTIONS  °· Keeping your head raised on a pillow when you are lying down may ease biopsy site discomfort. °· Supporting the back of your head and neck with both hands as you sit up from a lying position may ease biopsy site discomfort. °· Only take over-the-counter or prescription medicines for pain, discomfort, or fever as directed by your caregiver. °· Throat lozenges or gargling with warm salt  water may help to soothe a sore throat. °SEEK IMMEDIATE MEDICAL CARE IF:  °· You have severe bleeding from the biopsy site. °· You have difficulty swallowing. °· You have a fever. °· You have increased pain, swelling, redness, or warmth at the biopsy site. °· You notice pus coming from the biopsy site. °· You have swollen glands (lymph nodes) in your neck. °Document Released: 12/20/2006 Document Revised: 06/19/2012 Document Reviewed: 05/22/2008 °[employer]® Patient Information ©2014 [employer]. ° °

## 2013-08-16 NOTE — Procedures (Signed)
PreOperative Dx: Complex LEFT thyroid cyst Postoperative Dx: Complex LEFT thyroid cyst Procedure:   US guided aspiration o Radiologist:  Thornton Papas Anesthesia:  1.5 ml of 2% lidocaine Specimen:  2 ml of turbid yellow fluid  EBL:   < 1 ml Complications: None

## 2013-09-11 LAB — COMPLETE METABOLIC PANEL WITH GFR
ALT: 11 U/L (ref 0–35)
AST: 9 U/L (ref 0–37)
Albumin: 4.5 g/dL (ref 3.5–5.2)
Alkaline Phosphatase: 120 U/L — ABNORMAL HIGH (ref 39–117)
BUN: 20 mg/dL (ref 6–23)
CO2: 28 mEq/L (ref 19–32)
Calcium: 9.3 mg/dL (ref 8.4–10.5)
Chloride: 103 mEq/L (ref 96–112)
Creat: 0.57 mg/dL (ref 0.50–1.10)
GFR, Est African American: >89 mL/min
GFR, Est Non African American: >89 mL/min
Glucose, Bld: 117 mg/dL — ABNORMAL HIGH (ref 70–99)
Potassium: 3.9 mEq/L (ref 3.5–5.3)
Sodium: 141 mEq/L (ref 135–145)
Total Bilirubin: 0.5 mg/dL (ref 0.2–1.2)
Total Protein: 7.2 g/dL (ref 6.0–8.3)

## 2013-09-11 LAB — LIPID PANEL
Cholesterol: 234 mg/dL — ABNORMAL HIGH (ref 0–200)
HDL: 88 mg/dL (ref >39)
LDL Cholesterol: 133 mg/dL — ABNORMAL HIGH (ref 0–99)
Total CHOL/HDL Ratio: 2.7 Ratio
Triglycerides: 63 mg/dL (ref <150)
VLDL: 13 mg/dL (ref 0–40)

## 2013-09-11 LAB — HEMOGLOBIN A1C
Hgb A1c MFr Bld: 7.7 % — ABNORMAL HIGH (ref <5.7)
Mean Plasma Glucose: 174 mg/dL — ABNORMAL HIGH (ref <117)

## 2013-09-13 ENCOUNTER — Ambulatory Visit (INDEPENDENT_AMBULATORY_CARE_PROVIDER_SITE_OTHER): Payer: BC Managed Care – PPO | Admitting: Family Medicine

## 2013-09-13 ENCOUNTER — Encounter: Payer: Self-pay | Admitting: Family Medicine

## 2013-09-13 ENCOUNTER — Encounter (INDEPENDENT_AMBULATORY_CARE_PROVIDER_SITE_OTHER): Payer: Self-pay

## 2013-09-13 VITALS — BP 132/82 | HR 84 | Resp 16 | Wt 144.8 lb

## 2013-09-13 DIAGNOSIS — I10 Essential (primary) hypertension: Secondary | ICD-10-CM

## 2013-09-13 DIAGNOSIS — IMO0002 Reserved for concepts with insufficient information to code with codable children: Secondary | ICD-10-CM

## 2013-09-13 DIAGNOSIS — E1065 Type 1 diabetes mellitus with hyperglycemia: Secondary | ICD-10-CM

## 2013-09-13 DIAGNOSIS — Z23 Encounter for immunization: Secondary | ICD-10-CM

## 2013-09-13 DIAGNOSIS — IMO0001 Reserved for inherently not codable concepts without codable children: Secondary | ICD-10-CM

## 2013-09-13 DIAGNOSIS — Z2911 Encounter for prophylactic immunotherapy for respiratory syncytial virus (RSV): Secondary | ICD-10-CM

## 2013-09-13 DIAGNOSIS — E1165 Type 2 diabetes mellitus with hyperglycemia: Principal | ICD-10-CM

## 2013-09-13 DIAGNOSIS — Z794 Long term (current) use of insulin: Principal | ICD-10-CM

## 2013-09-13 DIAGNOSIS — E785 Hyperlipidemia, unspecified: Secondary | ICD-10-CM

## 2013-09-13 NOTE — Progress Notes (Signed)
   Subjective:    Patient ID: [name], female    DOB: 1951-02-10, 63 y.o.   MRN: [id number]  HPI The PT is here for follow up and re-evaluation of chronic medical conditions, medication management and review of any available recent lab and radiology data. Was scheduled for physical exam but is opting to check ins coverage first Preventive health is updated, specifically  Cancer screening and Immunization.   Questions or concerns regarding consultations or procedures which the PT has had in the interim are  Addressed.Had biopsy of neck mass which is benign and she will f/u with ENT The PT denies any adverse reactions to current medications since the last visit.  There are no new concerns.     '   Review of Systems    See HPI Denies recent fever or chills. Denies sinus pressure, nasal congestion, ear pain or sore throat. Denies chest congestion, productive cough or wheezing. Denies chest pains, palpitations and leg swelling Denies abdominal pain, nausea, vomiting,diarrhea or constipation.   Denies dysuria, frequency, hesitancy or incontinence. Unchanged chronic neck and upper extremity painm Denies headaches, seizures, numbness, or tingling. Denies depression, anxiety or insomnia. Denies skin break down or rash.     Objective:   Physical Exam BP 132/82  Pulse 84  Resp 16  Wt 144 lb 12.8 oz (65.681 kg)  SpO2 98% Patient alert and oriented and in no cardiopulmonary distress.  HEENT: No facial asymmetry, EOMI,   oropharynx pink and moist.  Neck supple no JVD, no mass.  Chest: Clear to auscultation bilaterally.  CVS: S1, S2 no murmurs, no S3.Regular rate.  ABD: Soft non tender.   Ext: No edema  MS: Adequate ROM spine, shoulders, hips and knees.  Skin: Intact, no ulcerations or rash noted.  Psych: Good eye contact, normal affect. Memory intact not anxious or depressed appearing.  CNS: CN 2-12 intact, power,  normal throughout.no focal deficits  noted.        Assessment & Plan:  HYPERTENSION Controlled, no change in medication DASH diet and commitment to daily physical activity for a minimum of 30 minutes discussed and encouraged, as a part of hypertension management. The importance of attaining a healthy weight is also discussed.   Diabetes mellitus, insulin dependent (IDDM), uncontrolled Uncontrolled, increase lantus dose and lifestyle change Patient advised to reduce carb and sweets, commit to regular physical activity, take meds as prescribed, test blood as directed, and attempt to lose weight, to improve blood sugar control. Needs to attend diabetic class  HYPERLIPIDEMIA Deteriorated, due to medical non compliance with treeatment Pt to resume medication Hyperlipidemia:Low fat diet discussed and encouraged.  Updated lab needed at/ before next visit.   Need for zoster vaccination Vaccine administered at visit

## 2013-09-13 NOTE — Patient Instructions (Addendum)
F/u in 3.5 month,  With foot exam call if you need me before  Zostavax today  Call for pneumonia vaccine next month  You need to take the prvastatin for your cholesterol which is TOO high, also follow low fat diet  Start lantus 20 units daily, after 1 week if FBG averages over 130 then increase to 23 units, after the next week if the same applies then increase to 26 units  Goal for your fasting  Blood sugar is 90 to 130  Call office with questions  Please seriously consider attending diabetic classes in the office these will help you nurse to give you info  Fasting lipid, cmp and EGFr, hBa1C and microalb in 3.5 month  You need to change eating habits and commit to daily exercise for at least 30 mins  You are referred to eye specialist let scheduler know name of Doc you chose

## 2013-09-15 DIAGNOSIS — Z23 Encounter for immunization: Secondary | ICD-10-CM | POA: Insufficient documentation

## 2013-09-15 NOTE — Assessment & Plan Note (Signed)
Controlled, no change in medication DASH diet and commitment to daily physical activity for a minimum of 30 minutes discussed and encouraged, as a part of hypertension management. The importance of attaining a healthy weight is also discussed.  

## 2013-09-15 NOTE — Assessment & Plan Note (Addendum)
Uncontrolled, increase lantus dose and lifestyle change Patient advised to reduce carb and sweets, commit to regular physical activity, take meds as prescribed, test blood as directed, and attempt to lose weight, to improve blood sugar control. Needs to attend diabetic class

## 2013-09-15 NOTE — Assessment & Plan Note (Signed)
Vaccine administered at visit.  

## 2013-09-15 NOTE — Assessment & Plan Note (Signed)
Deteriorated, due to medical non compliance with treeatment Pt to resume medication Hyperlipidemia:Low fat diet discussed and encouraged.  Updated lab needed at/ before next visit.

## 2013-09-25 ENCOUNTER — Other Ambulatory Visit: Payer: Self-pay | Admitting: Family Medicine

## 2013-09-25 DIAGNOSIS — N63 Unspecified lump in unspecified breast: Secondary | ICD-10-CM

## 2013-10-01 ENCOUNTER — Inpatient Hospital Stay: Admission: RE | Admit: 2013-10-01 | Payer: BC Managed Care – PPO | Source: Ambulatory Visit

## 2013-10-04 ENCOUNTER — Other Ambulatory Visit: Payer: Self-pay | Admitting: Family Medicine

## 2013-10-04 ENCOUNTER — Ambulatory Visit
Admission: RE | Admit: 2013-10-04 | Discharge: 2013-10-04 | Disposition: A | Discharge status: Home / Self Care | Payer: BC Managed Care – PPO | Source: Ambulatory Visit | Attending: Family Medicine | Admitting: Family Medicine

## 2013-10-04 DIAGNOSIS — N63 Unspecified lump in unspecified breast: Secondary | ICD-10-CM

## 2013-10-04 DIAGNOSIS — Z87898 Personal history of other specified conditions: Secondary | ICD-10-CM

## 2013-11-18 ENCOUNTER — Other Ambulatory Visit: Payer: Self-pay | Admitting: Family Medicine

## 2013-12-24 ENCOUNTER — Other Ambulatory Visit: Payer: Self-pay | Admitting: Family Medicine

## 2013-12-24 DIAGNOSIS — N6489 Other specified disorders of breast: Secondary | ICD-10-CM

## 2013-12-25 ENCOUNTER — Encounter (HOSPITAL_COMMUNITY): Payer: BC Managed Care – PPO

## 2013-12-27 ENCOUNTER — Other Ambulatory Visit: Payer: Self-pay | Admitting: Family Medicine

## 2013-12-27 ENCOUNTER — Other Ambulatory Visit: Payer: Self-pay

## 2013-12-27 DIAGNOSIS — N6489 Other specified disorders of breast: Secondary | ICD-10-CM

## 2014-01-10 ENCOUNTER — Ambulatory Visit
Admission: RE | Admit: 2014-01-10 | Discharge: 2014-01-10 | Disposition: A | Discharge status: Home / Self Care | Payer: BC Managed Care – PPO | Source: Ambulatory Visit | Attending: Family Medicine | Admitting: Family Medicine

## 2014-01-10 DIAGNOSIS — N6489 Other specified disorders of breast: Secondary | ICD-10-CM

## 2014-01-23 LAB — MICROALBUMIN / CREATININE URINE RATIO
Creatinine, Urine: 40.8 mg/dL
Microalb Creat Ratio: 24.5 mg/g (ref 0.0–30.0)
Microalb, Ur: 1 mg/dL (ref <2.0)

## 2014-01-23 LAB — LIPID PANEL
Cholesterol: 229 mg/dL — ABNORMAL HIGH (ref 0–200)
HDL: 76 mg/dL (ref >39)
LDL Cholesterol: 141 mg/dL — ABNORMAL HIGH (ref 0–99)
Total CHOL/HDL Ratio: 3 Ratio
Triglycerides: 59 mg/dL (ref <150)
VLDL: 12 mg/dL (ref 0–40)

## 2014-01-23 LAB — HEMOGLOBIN A1C
Hgb A1c MFr Bld: 8 % — ABNORMAL HIGH (ref <5.7)
Mean Plasma Glucose: 183 mg/dL — ABNORMAL HIGH (ref <117)

## 2014-01-25 ENCOUNTER — Encounter: Payer: Self-pay | Admitting: Family Medicine

## 2014-01-25 ENCOUNTER — Ambulatory Visit (INDEPENDENT_AMBULATORY_CARE_PROVIDER_SITE_OTHER): Payer: BC Managed Care – PPO

## 2014-01-25 ENCOUNTER — Ambulatory Visit (INDEPENDENT_AMBULATORY_CARE_PROVIDER_SITE_OTHER): Payer: BC Managed Care – PPO | Admitting: Family Medicine

## 2014-01-25 ENCOUNTER — Encounter (INDEPENDENT_AMBULATORY_CARE_PROVIDER_SITE_OTHER): Payer: Self-pay

## 2014-01-25 VITALS — BP 130/78 | HR 91 | Resp 16 | Ht 64.0 in | Wt 150.0 lb

## 2014-01-25 DIAGNOSIS — E1165 Type 2 diabetes mellitus with hyperglycemia: Secondary | ICD-10-CM

## 2014-01-25 DIAGNOSIS — IMO0001 Reserved for inherently not codable concepts without codable children: Secondary | ICD-10-CM

## 2014-01-25 DIAGNOSIS — E785 Hyperlipidemia, unspecified: Secondary | ICD-10-CM

## 2014-01-25 DIAGNOSIS — Z23 Encounter for immunization: Secondary | ICD-10-CM

## 2014-01-25 DIAGNOSIS — I10 Essential (primary) hypertension: Secondary | ICD-10-CM

## 2014-01-25 DIAGNOSIS — M542 Cervicalgia: Secondary | ICD-10-CM

## 2014-01-25 DIAGNOSIS — M25511 Pain in right shoulder: Secondary | ICD-10-CM

## 2014-01-25 DIAGNOSIS — R109 Unspecified abdominal pain: Secondary | ICD-10-CM

## 2014-01-25 DIAGNOSIS — E1065 Type 1 diabetes mellitus with hyperglycemia: Secondary | ICD-10-CM

## 2014-01-25 DIAGNOSIS — Z794 Long term (current) use of insulin: Secondary | ICD-10-CM

## 2014-01-25 DIAGNOSIS — N3091 Cystitis, unspecified with hematuria: Secondary | ICD-10-CM | POA: Insufficient documentation

## 2014-01-25 HISTORY — DX: Pain in right shoulder: M25.511

## 2014-01-25 LAB — POCT URINALYSIS DIPSTICK
Bilirubin, UA: NEGATIVE
Glucose, UA: NEGATIVE
Ketones, UA: NEGATIVE
Leukocytes, UA: NEGATIVE
Nitrite, UA: NEGATIVE
Protein, UA: NEGATIVE
Spec Grav, UA: 1.02
Urobilinogen, UA: 0.2
pH, UA: 5.5

## 2014-01-25 MED ORDER — KETOROLAC TROMETHAMINE 60 MG/2ML IM SOLN
60.0000 mg | Freq: Once | INTRAMUSCULAR | Status: AC
  Administered 2014-01-25: 60 mg via INTRAMUSCULAR

## 2014-01-25 MED ORDER — ATORVASTATIN CALCIUM 10 MG PO TABS: 10.0000 mg | ORAL_TABLET | Freq: Every day | ORAL | Status: DC

## 2014-01-25 NOTE — Patient Instructions (Addendum)
Annual physical exam in 3 month, call if you ned me before  Flu vaccine today  Foot exam is good, except for fungal nail infection  You NEED to let us know which eye specialist you want to see so we can schedule an appointment  Increase lantus to 20 units daily, start daily exercise, and change eating habits to improve blood sugar  New for cholesterol is lipitor 10 mg , call back if a problem as you need your cholesterol lowered  BP is excellent  Fasting HBA1C, cmp and EGFR, lipid, CBC in 3 month  Toradol 60 mg IM in office today for right shoulder pain  All the best for 2016  Urine is checked, if no infection you will have an [place] of kidneys for flank pain

## 2014-01-25 NOTE — Assessment & Plan Note (Signed)
Urine sent for analysis and c/s, will also order renal US due to flank pain and chrionic medical illness

## 2014-01-25 NOTE — Assessment & Plan Note (Signed)
2 day history, has established c spine disease, toradol in office today

## 2014-01-25 NOTE — Assessment & Plan Note (Signed)
Controlled, no change in medication DASH diet and commitment to daily physical activity for a minimum of 30 minutes discussed and encouraged, as a part of hypertension management. The importance of attaining a healthy weight is also discussed.  

## 2014-01-25 NOTE — Progress Notes (Signed)
   Subjective:    Patient ID: [name], female    DOB: [birth year], 63 y.o.   MRN: [id number]  HPI The PT is here for follow up and re-evaluation of chronic medical conditions, medication management and review of any available recent lab and radiology data.  Preventive health is updated, specifically  Cancer screening and Immunization. Needs eye exam is aware and is deciding which Doc she wants Questions or concerns regarding consultations or procedures which the PT has had in the interim are  addressed. C/o bilateral upper arm pain on pravachol so she stopped taking C/o right shoulder pain x 2 days, no known irritating factor 3 week h/o left flank pain, no fever or chills No regular exercise     Review of Systems See HPI Denies recent fever or chills. Denies sinus pressure, nasal congestion, ear pain or sore throat. Denies chest congestion, productive cough or wheezing. Denies chest pains, palpitations and leg swelling Denies abdominal pain, nausea, vomiting,diarrhea or constipation.   Denies  hesitancy or incontinence.does have increased frequency and mild dysuria for past 2 days  Denies headaches, seizures, numbness, or tingling. Denies depression, anxiety or insomnia. Denies skin break down or rash.        Objective:   Physical Exam  BP 130/78 mmHg  Pulse 91  Resp 16  Ht 5\' 4"  (1.626 m)  Wt 150 lb (68.04 kg)  BMI 25.73 kg/m2  SpO2 99% Patient alert and oriented and in no cardiopulmonary distress.  HEENT: No facial asymmetry, EOMI,   oropharynx pink and moist.  Neck supple no JVD, no mass.  Chest: Clear to auscultation bilaterally.  CVS: S1, S2 no murmurs, no S3.Regular rate.  ABD: Soft , left renal angle tenderness, otherwise no guarding or rebound, normal BS  Ext: No edema  MS: Adequate ROM spine, , hips and knees.Decreased ROM right shoulder  Skin: Intact, no ulcerations or rash noted.  Psych: Good eye contact, normal affect. Memory intact not  anxious or depressed appearing.  CNS: CN 2-12 intact, power,  normal throughout.no focal deficits noted.       Assessment & Plan:  Essential hypertension Controlled, no change in medication DASH diet and commitment to daily physical activity for a minimum of 30 minutes discussed and encouraged, as a part of hypertension management. The importance of attaining a healthy weight is also discussed.   Diabetes mellitus, insulin dependent (IDDM), uncontrolled uncontroolled , inc the lantus   Shoulder pain, right 2 day history, has established c spine disease, toradol in office today  Need for prophylactic vaccination and inoculation against influenza Vaccine administered at visit.   Cystitis with hematuria Urine sent for analysis and c/s, will also order renal US due to flank pain and chrionic medical illness  Hyperlipidemia LDL goal <100 Uncontrolled, non compliant with med states it causes upper arm pain, will change statin, she is to call back if intolerant Hyperlipidemia:Low fat diet discussed and encouraged.  Updated lab needed at/ before next visit.

## 2014-01-25 NOTE — Assessment & Plan Note (Signed)
Vaccine administered at visit.  

## 2014-01-25 NOTE — Assessment & Plan Note (Signed)
uncontroolled , inc the lantus

## 2014-01-26 LAB — URINALYSIS
Bilirubin Urine: NEGATIVE
Glucose, UA: NEGATIVE mg/dL
Hgb urine dipstick: NEGATIVE
Ketones, ur: NEGATIVE mg/dL
Leukocytes, UA: NEGATIVE
Nitrite: NEGATIVE
Protein, ur: NEGATIVE mg/dL
Specific Gravity, Urine: 1.024 (ref 1.005–1.030)
Urobilinogen, UA: 0.2 mg/dL (ref 0.0–1.0)
pH: 6 (ref 5.0–8.0)

## 2014-01-26 NOTE — Assessment & Plan Note (Signed)
Uncontrolled, non compliant with med states it causes upper arm pain, will change statin, she is to call back if intolerant Hyperlipidemia:Low fat diet discussed and encouraged.  Updated lab needed at/ before next visit.

## 2014-01-27 LAB — URINE CULTURE
Colony Count: NO GROWTH
Organism ID, Bacteria: NO GROWTH

## 2014-01-29 ENCOUNTER — Ambulatory Visit (HOSPITAL_COMMUNITY): Payer: BC Managed Care – PPO

## 2014-02-05 ENCOUNTER — Telehealth: Payer: Self-pay

## 2014-02-05 ENCOUNTER — Ambulatory Visit (INDEPENDENT_AMBULATORY_CARE_PROVIDER_SITE_OTHER): Payer: BC Managed Care – PPO

## 2014-02-05 VITALS — BP 126/74

## 2014-02-05 DIAGNOSIS — M25511 Pain in right shoulder: Secondary | ICD-10-CM

## 2014-02-05 MED ORDER — PREDNISONE 5 MG PO TABS: 5.0000 mg | ORAL_TABLET | Freq: Two times a day (BID) | ORAL | Status: DC

## 2014-02-05 NOTE — Telephone Encounter (Signed)
Called and left message for patient to return call.  

## 2014-02-05 NOTE — Telephone Encounter (Signed)
Ok toradol 60mg  and depo medrol 80 mg Im and send in pred 5mg  one twice daily for 5 days also let her know this will help pls

## 2014-02-06 DIAGNOSIS — M25511 Pain in right shoulder: Secondary | ICD-10-CM

## 2014-02-06 MED ORDER — KETOROLAC TROMETHAMINE 60 MG/2ML IM SOLN
60.0000 mg | Freq: Once | INTRAMUSCULAR | Status: AC
  Administered 2014-02-06: 60 mg via INTRAMUSCULAR

## 2014-02-06 MED ORDER — METHYLPREDNISOLONE ACETATE 80 MG/ML IJ SUSP
80.0000 mg | Freq: Once | INTRAMUSCULAR | Status: AC
  Administered 2014-02-06: 80 mg via INTRAMUSCULAR

## 2014-02-06 NOTE — Telephone Encounter (Signed)
Patient aware and in for nurse visit.   Given rx for prednisone to take with her.

## 2014-02-06 NOTE — Progress Notes (Signed)
Patient in for injections for pain control.   C/o of right shoulder pain.   Injections given IM as directed.  Patient also given rx in hand for prednisone.

## 2014-04-18 ENCOUNTER — Encounter: Payer: BC Managed Care – PPO | Admitting: Family Medicine

## 2014-04-30 ENCOUNTER — Ambulatory Visit (HOSPITAL_COMMUNITY)
Admission: RE | Admit: 2014-04-30 | Discharge: 2014-04-30 | Disposition: A | Discharge status: Home / Self Care | Payer: BLUE CROSS/BLUE SHIELD | Source: Ambulatory Visit | Attending: Family Medicine | Admitting: Family Medicine

## 2014-04-30 DIAGNOSIS — E1165 Type 2 diabetes mellitus with hyperglycemia: Secondary | ICD-10-CM

## 2014-04-30 DIAGNOSIS — N3091 Cystitis, unspecified with hematuria: Secondary | ICD-10-CM | POA: Diagnosis not present

## 2014-04-30 DIAGNOSIS — E1065 Type 1 diabetes mellitus with hyperglycemia: Secondary | ICD-10-CM | POA: Diagnosis not present

## 2014-04-30 DIAGNOSIS — R109 Unspecified abdominal pain: Secondary | ICD-10-CM

## 2014-04-30 DIAGNOSIS — I10 Essential (primary) hypertension: Secondary | ICD-10-CM | POA: Insufficient documentation

## 2014-04-30 DIAGNOSIS — Z794 Long term (current) use of insulin: Secondary | ICD-10-CM

## 2014-04-30 DIAGNOSIS — IMO0001 Reserved for inherently not codable concepts without codable children: Secondary | ICD-10-CM

## 2014-05-24 ENCOUNTER — Telehealth: Payer: Self-pay | Admitting: Family Medicine

## 2014-05-24 NOTE — Telephone Encounter (Signed)
Patient wants her amlodipine reduced to 5mg . States she has been breaking them in half this whole time and her BP has been fine. Ok to send in reduced dose?

## 2014-05-27 ENCOUNTER — Other Ambulatory Visit: Payer: Self-pay | Admitting: Family Medicine

## 2014-05-28 ENCOUNTER — Other Ambulatory Visit: Payer: Self-pay | Admitting: Family Medicine

## 2014-05-30 ENCOUNTER — Other Ambulatory Visit: Payer: Self-pay

## 2014-05-30 DIAGNOSIS — I10 Essential (primary) hypertension: Secondary | ICD-10-CM

## 2014-05-30 MED ORDER — AMLODIPINE BESYLATE 5 MG PO TABS: 5.0000 mg | ORAL_TABLET | Freq: Every day | ORAL | Status: DC

## 2014-06-05 DIAGNOSIS — E785 Hyperlipidemia, unspecified: Secondary | ICD-10-CM | POA: Diagnosis not present

## 2014-06-05 DIAGNOSIS — E1065 Type 1 diabetes mellitus with hyperglycemia: Secondary | ICD-10-CM | POA: Diagnosis not present

## 2014-06-05 DIAGNOSIS — E109 Type 1 diabetes mellitus without complications: Secondary | ICD-10-CM | POA: Diagnosis not present

## 2014-06-05 DIAGNOSIS — I10 Essential (primary) hypertension: Secondary | ICD-10-CM | POA: Diagnosis not present

## 2014-06-06 ENCOUNTER — Other Ambulatory Visit (HOSPITAL_COMMUNITY)
Admission: RE | Admit: 2014-06-06 | Discharge: 2014-06-06 | Disposition: A | Discharge status: Home / Self Care | Payer: Medicare Other | Source: Ambulatory Visit | Attending: Family Medicine | Admitting: Family Medicine

## 2014-06-06 ENCOUNTER — Encounter: Payer: Self-pay | Admitting: Family Medicine

## 2014-06-06 ENCOUNTER — Ambulatory Visit (INDEPENDENT_AMBULATORY_CARE_PROVIDER_SITE_OTHER): Payer: Medicare Other | Admitting: Family Medicine

## 2014-06-06 VITALS — BP 136/82 | HR 100 | Resp 18 | Ht 64.0 in | Wt 150.0 lb

## 2014-06-06 DIAGNOSIS — Z1211 Encounter for screening for malignant neoplasm of colon: Secondary | ICD-10-CM | POA: Diagnosis not present

## 2014-06-06 DIAGNOSIS — Z124 Encounter for screening for malignant neoplasm of cervix: Secondary | ICD-10-CM | POA: Insufficient documentation

## 2014-06-06 DIAGNOSIS — E1065 Type 1 diabetes mellitus with hyperglycemia: Secondary | ICD-10-CM

## 2014-06-06 DIAGNOSIS — Z0000 Encounter for general adult medical examination without abnormal findings: Secondary | ICD-10-CM | POA: Diagnosis not present

## 2014-06-06 DIAGNOSIS — Z01419 Encounter for gynecological examination (general) (routine) without abnormal findings: Secondary | ICD-10-CM

## 2014-06-06 DIAGNOSIS — B351 Tinea unguium: Secondary | ICD-10-CM

## 2014-06-06 DIAGNOSIS — Z794 Long term (current) use of insulin: Principal | ICD-10-CM

## 2014-06-06 DIAGNOSIS — IMO0001 Reserved for inherently not codable concepts without codable children: Secondary | ICD-10-CM

## 2014-06-06 DIAGNOSIS — E1165 Type 2 diabetes mellitus with hyperglycemia: Principal | ICD-10-CM

## 2014-06-06 LAB — COMPLETE METABOLIC PANEL WITH GFR
ALT: 11 U/L (ref 0–35)
AST: 12 U/L (ref 0–37)
Albumin: 4.7 g/dL (ref 3.5–5.2)
Alkaline Phosphatase: 111 U/L (ref 39–117)
BUN: 15 mg/dL (ref 6–23)
CO2: 29 mEq/L (ref 19–32)
Calcium: 9.4 mg/dL (ref 8.4–10.5)
Chloride: 99 mEq/L (ref 96–112)
Creat: 0.54 mg/dL (ref 0.50–1.10)
GFR, Est African American: >89 mL/min
GFR, Est Non African American: >89 mL/min
Glucose, Bld: 123 mg/dL — ABNORMAL HIGH (ref 70–99)
Potassium: 4 mEq/L (ref 3.5–5.3)
Sodium: 136 mEq/L (ref 135–145)
Total Bilirubin: 0.5 mg/dL (ref 0.2–1.2)
Total Protein: 7.4 g/dL (ref 6.0–8.3)

## 2014-06-06 LAB — CBC WITH DIFFERENTIAL/PLATELET
Basophils Absolute: 0 10*3/uL (ref 0.0–0.1)
Basophils Relative: 0 % (ref 0–1)
Eosinophils Absolute: 0 10*3/uL (ref 0.0–0.7)
Eosinophils Relative: 1 % (ref 0–5)
HCT: 40.1 % (ref 36.0–46.0)
Hemoglobin: 12.7 g/dL (ref 12.0–15.0)
Lymphocytes Relative: 39 % (ref 12–46)
Lymphs Abs: 1.7 10*3/uL (ref 0.7–4.0)
MCH: 28.6 pg (ref 26.0–34.0)
MCHC: 31.7 g/dL (ref 30.0–36.0)
MCV: 90.3 fL (ref 78.0–100.0)
MPV: 9.7 fL (ref 9.4–12.4)
Monocytes Absolute: 0.3 10*3/uL (ref 0.1–1.0)
Monocytes Relative: 6 % (ref 3–12)
Neutro Abs: 2.3 10*3/uL (ref 1.7–7.7)
Neutrophils Relative %: 54 % (ref 43–77)
Platelets: 378 10*3/uL (ref 150–400)
RBC: 4.44 MIL/uL (ref 3.87–5.11)
RDW: 13.6 % (ref 11.5–15.5)
WBC: 4.3 10*3/uL (ref 4.0–10.5)

## 2014-06-06 LAB — HEMOGLOBIN A1C
Hgb A1c MFr Bld: 7.3 % — ABNORMAL HIGH (ref <5.7)
Mean Plasma Glucose: 163 mg/dL — ABNORMAL HIGH (ref <117)

## 2014-06-06 LAB — HEMOCCULT GUIAC POC 1CARD (OFFICE): Fecal Occult Blood, POC: NEGATIVE

## 2014-06-06 LAB — LIPID PANEL
Cholesterol: 200 mg/dL (ref 0–200)
HDL: 87 mg/dL (ref >=46)
LDL Cholesterol: 102 mg/dL — ABNORMAL HIGH (ref 0–99)
Total CHOL/HDL Ratio: 2.3 Ratio
Triglycerides: 54 mg/dL (ref <150)
VLDL: 11 mg/dL (ref 0–40)

## 2014-06-06 MED ORDER — TERBINAFINE HCL 250 MG PO TABS: 250.0000 mg | ORAL_TABLET | Freq: Every day | ORAL | Status: DC

## 2014-06-06 NOTE — Patient Instructions (Addendum)
F/u in 4 month, call if you need me before   Congrats on markedly improved labs , no changes in meds  HBA1C, chem 7 and EGFR non fasting in 4 month  It is important that you exercise regularly at least 30 minutes 5 times a week. If you develop chest pain, have severe difficulty breathing, or feel very tired, stop exercising immediately and seek medical attention   A healthy diet is rich in fruit, vegetables and whole grains. Poultry fish, nuts and beans are a healthy choice for protein rather then red meat. A low sodium diet and drinking 64 ounces of water daily is generally recommended. Oils and sweet should be limited. Carbohydrates especially for those who are diabetic or overweight, should be limited to 60-45 gram per meal. It is important to eat on a regular schedule, at least 3 times daily. Snacks should be primarily fruits, vegetables or nuts.

## 2014-06-08 NOTE — Assessment & Plan Note (Signed)
Oral medication prescribed for 12 weeks

## 2014-06-08 NOTE — Assessment & Plan Note (Signed)

## 2014-06-08 NOTE — Progress Notes (Signed)
   Subjective:    Patient ID: [name], female    DOB: 18-Dec-1950, 64 y.o.   MRN: [id number]  HPI Patient is in for annual physical exam. No other health concerns are expressed or addressed at the visit. Recent labs,  are reviewed. Immunization is reviewed , and  updated if needed.    Review of Systems See HPI     Objective:   Physical Exam BP 136/82 mmHg  Pulse 100  Resp 18  Ht 5\' 4"  (1.626 m)  Wt 150 lb (68.04 kg)  BMI 25.73 kg/m2  SpO2 97% Pleasant well nourished female, alert and oriented x 3, in no cardio-pulmonary distress. Afebrile. HEENT No facial trauma or asymetry. Sinuses non tender.  Extra occullar muscles intact, pupils equally reactive to light. External ears normal, tympanic membranes clear. Oropharynx moist, no exudate,fairly  good dentition. Neck: supple, no adenopathy,JVD or thyromegaly.No bruits.  Chest: Clear to ascultation bilaterally.No crackles or wheezes. Non tender to palpation  Breast: No asymetry,no masses or lumps. No tenderness. No nipple discharge or inversion. No axillary or supraclavicular adenopathy  Cardiovascular system; Heart sounds normal,  S1 and  S2 ,no S3.  No murmur, or thrill. Apical beat not displaced Peripheral pulses normal.  Abdomen: Soft, non tender, no organomegaly or masses. No bruits. Bowel sounds normal. No guarding, tenderness or rebound.  Rectal:  Normal sphincter tone. No mass.No rectal masses.  Guaiac negative stool.  GU: External genitalia normal female genitalia , female distribution of hair. No lesions. Urethral meatus normal in size, no  Prolapse, no lesions visibly  Present.rectum Bladder non tender. Vagina pink and moist , with no visible lesions , yellow discharge present .Likely has fistula to  Adequate pelvic support no  cystocele or rectocele noted  Uterus absent , no adnexal masses, no  adnexal tenderness.   Musculoskeletal exam: Full ROM of spine, hips , shoulders and  knees. No deformity ,swelling or crepitus noted. No muscle wasting or atrophy.   Neurologic: Cranial nerves 2 to 12 intact. Power, tone ,sensation and reflexes normal throughout. No disturbance in gait. No tremor.  Skin: Intact, no ulceration, erythema , scaling or rash noted. Pigmentation normal throughout Bilateral onychomycosis of toenails, severe, with crumbling and loss of nail  Psych; Normal mood and affect. Judgement and concentration normal        Assessment & Plan:  Onychomycosis Oral medication prescribed for 12 weeks   Annual physical exam Annual exam as documented. Counseling done  re healthy lifestyle involving commitment to 150 minutes exercise per week, heart healthy diet, and attaining healthy weight.The importance of adequate sleep also discussed. Regular seat belt use and home safety, is also discussed. Changes in health habits are decided on by the patient with goals and time frames  set for achieving them. Immunization and cancer screening needs are specifically addressed at this visit.

## 2014-06-10 ENCOUNTER — Telehealth: Payer: Self-pay

## 2014-06-11 ENCOUNTER — Telehealth: Payer: Self-pay | Admitting: Family Medicine

## 2014-06-11 LAB — CYTOLOGY - PAP

## 2014-06-11 NOTE — Telephone Encounter (Signed)
Spoke directly with the pt , she denies any intermittent stinging, burning sensation, does not want HSV2  Blood test done States the "bump' presented itself last week Saturday, several days after her gyne exam which was normal in that regard. Recalls seeing / feeling similar years ago when first dx with diabetes. Offered gyne eval of same, she is opting to hold off and call back if still concerned, denies any enlargement or associated pain

## 2014-06-11 NOTE — Telephone Encounter (Signed)
Concern addressed

## 2014-06-16 ENCOUNTER — Other Ambulatory Visit: Payer: Self-pay | Admitting: Family Medicine

## 2014-06-17 ENCOUNTER — Telehealth: Payer: Self-pay | Admitting: Family Medicine

## 2014-06-17 ENCOUNTER — Other Ambulatory Visit: Payer: Self-pay

## 2014-06-17 MED ORDER — INSULIN GLARGINE 100 UNIT/ML SOLOSTAR PEN: 16.0000 [IU] | PEN_INJECTOR | Freq: Every evening | SUBCUTANEOUS | Status: DC

## 2014-06-17 MED ORDER — INSULIN PEN NEEDLE 31G X 8 MM MISC: Status: DC

## 2014-06-17 NOTE — Telephone Encounter (Signed)
Med refilled.

## 2014-07-09 DIAGNOSIS — L723 Sebaceous cyst: Secondary | ICD-10-CM | POA: Diagnosis not present

## 2014-07-20 ENCOUNTER — Other Ambulatory Visit: Payer: Self-pay | Admitting: Family Medicine

## 2014-07-30 ENCOUNTER — Other Ambulatory Visit (INDEPENDENT_AMBULATORY_CARE_PROVIDER_SITE_OTHER): Payer: Self-pay | Admitting: Otolaryngology

## 2014-07-30 DIAGNOSIS — E041 Nontoxic single thyroid nodule: Secondary | ICD-10-CM

## 2014-08-01 ENCOUNTER — Ambulatory Visit (HOSPITAL_COMMUNITY): Payer: Medicare Other

## 2014-08-06 ENCOUNTER — Other Ambulatory Visit: Payer: Self-pay | Admitting: Family Medicine

## 2014-09-25 DIAGNOSIS — L818 Other specified disorders of pigmentation: Secondary | ICD-10-CM | POA: Diagnosis not present

## 2014-09-25 DIAGNOSIS — L723 Sebaceous cyst: Secondary | ICD-10-CM | POA: Diagnosis not present

## 2014-10-07 DIAGNOSIS — E1065 Type 1 diabetes mellitus with hyperglycemia: Secondary | ICD-10-CM | POA: Diagnosis not present

## 2014-10-07 DIAGNOSIS — E109 Type 1 diabetes mellitus without complications: Secondary | ICD-10-CM | POA: Diagnosis not present

## 2014-10-07 LAB — BASIC METABOLIC PANEL WITH GFR
BUN: 18 mg/dL (ref 7–25)
CO2: 29 mmol/L (ref 20–31)
Calcium: 9.5 mg/dL (ref 8.6–10.4)
Chloride: 100 mmol/L (ref 98–110)
Creat: 0.53 mg/dL (ref 0.50–0.99)
GFR, Est African American: >89 mL/min (ref >=60)
GFR, Est Non African American: >89 mL/min (ref >=60)
Glucose, Bld: 146 mg/dL — ABNORMAL HIGH (ref 65–99)
Potassium: 4.4 mmol/L (ref 3.5–5.3)
Sodium: 138 mmol/L (ref 135–146)

## 2014-10-07 LAB — HEMOGLOBIN A1C
Hgb A1c MFr Bld: 7.9 % — ABNORMAL HIGH (ref <5.7)
Mean Plasma Glucose: 180 mg/dL — ABNORMAL HIGH (ref <117)

## 2014-10-08 ENCOUNTER — Encounter: Payer: Self-pay | Admitting: Family Medicine

## 2014-10-08 ENCOUNTER — Ambulatory Visit (INDEPENDENT_AMBULATORY_CARE_PROVIDER_SITE_OTHER): Payer: Medicare Other | Admitting: Family Medicine

## 2014-10-08 VITALS — BP 138/82 | HR 89 | Resp 16 | Ht 64.0 in | Wt 148.1 lb

## 2014-10-08 DIAGNOSIS — IMO0002 Reserved for concepts with insufficient information to code with codable children: Secondary | ICD-10-CM

## 2014-10-08 DIAGNOSIS — Z2820 Immunization not carried out because of patient decision for unspecified reason: Secondary | ICD-10-CM

## 2014-10-08 DIAGNOSIS — I10 Essential (primary) hypertension: Secondary | ICD-10-CM

## 2014-10-08 DIAGNOSIS — E10628 Type 1 diabetes mellitus with other skin complications: Secondary | ICD-10-CM | POA: Diagnosis not present

## 2014-10-08 DIAGNOSIS — E109 Type 1 diabetes mellitus without complications: Secondary | ICD-10-CM

## 2014-10-08 DIAGNOSIS — E1065 Type 1 diabetes mellitus with hyperglycemia: Secondary | ICD-10-CM

## 2014-10-08 DIAGNOSIS — IMO0001 Reserved for inherently not codable concepts without codable children: Secondary | ICD-10-CM

## 2014-10-08 DIAGNOSIS — E785 Hyperlipidemia, unspecified: Secondary | ICD-10-CM | POA: Diagnosis not present

## 2014-10-08 DIAGNOSIS — Z794 Long term (current) use of insulin: Secondary | ICD-10-CM

## 2014-10-08 DIAGNOSIS — E119 Type 2 diabetes mellitus without complications: Secondary | ICD-10-CM

## 2014-10-08 MED ORDER — INSULIN GLARGINE 100 UNIT/ML SOLOSTAR PEN: PEN_INJECTOR | SUBCUTANEOUS | Status: DC

## 2014-10-08 NOTE — Patient Instructions (Addendum)
Welcome to [employer] 2nd week in November, call if you need me before  Increase lantus to 25 units daily  Test  At least twice daily and record, call with concerns  Goal for fasting blood sugar ranges from 80 to 130 and 2 hours after any meal or at bedtime should be between 140 to 180.   You are referred to diabetic educator,VERY USEFUL please follow through  It is important that you exercise regularly at least 30 minutes 7 times a week. If you develop chest pain, have severe difficulty breathing, or feel very tired, stop exercising immediately and seek medical attention   For hot flashes, exrercise, cool water and dressing lightly help  Pneumonia vaccine due , you do need this also your mammogram  Fasting lipid, cmp and EGFr , HBA1C  Thanks for choosing [facility], we consider it a privelige to serve you.

## 2014-11-19 DIAGNOSIS — L700 Acne vulgaris: Secondary | ICD-10-CM | POA: Diagnosis not present

## 2014-11-19 DIAGNOSIS — L723 Sebaceous cyst: Secondary | ICD-10-CM | POA: Diagnosis not present

## 2014-11-22 ENCOUNTER — Other Ambulatory Visit: Payer: Self-pay | Admitting: Family Medicine

## 2014-11-26 DIAGNOSIS — L723 Sebaceous cyst: Secondary | ICD-10-CM | POA: Diagnosis not present

## 2014-11-27 ENCOUNTER — Other Ambulatory Visit: Payer: Self-pay

## 2014-11-27 MED ORDER — GLUCOSE BLOOD VI STRP: ORAL_STRIP | Status: DC

## 2014-12-11 DIAGNOSIS — Z2820 Immunization not carried out because of patient decision for unspecified reason: Secondary | ICD-10-CM | POA: Insufficient documentation

## 2014-12-11 NOTE — Assessment & Plan Note (Signed)
Again counseled re need for u[pdate immunization, continues to refuse , will address in the future again

## 2014-12-11 NOTE — Progress Notes (Signed)
Subjective:    Patient ID: [name], female    DOB: 03-25-1950, 65 y.o.   MRN: [id number]  HPI   [name]     MRN: [id number]      DOB: 09-17-1950   HPI [name] is here for follow up and re-evaluation of chronic medical conditions, medication management and review of any available recent lab and radiology data.  Preventive health is updated, specifically  Cancer screening and Immunization.   Questions or concerns regarding consultations or procedures which the PT has had in the interim are  addressed. The PT denies any adverse reactions to current medications since the last visit.  There are no new concerns.  There are no specific complaints   ROS Denies recent fever or chills. Denies sinus pressure, nasal congestion, ear pain or sore throat. Denies chest congestion, productive cough or wheezing. Denies chest pains, palpitations and leg swelling Denies abdominal pain, nausea, vomiting,diarrhea or constipation.   Denies dysuria, frequency, hesitancy or incontinence. Denies joint pain, swelling and limitation in mobility. Denies headaches, seizures, numbness, or tingling. Denies depression, anxiety or insomnia. Denies skin break down or rash.   PE  BP 138/82 mmHg  Pulse 89  Resp 16  Ht 5\' 4"  (1.626 m)  Wt 148 lb 1.9 oz (67.187 kg)  BMI 25.41 kg/m2  SpO2 96%  Patient alert and oriented and in no cardiopulmonary distress.  HEENT: No facial asymmetry, EOMI,   oropharynx pink and moist.  Neck supple no JVD, no mass.  Chest: Clear to auscultation bilaterally.  CVS: S1, S2 no murmurs, no S3.Regular rate.  ABD: Soft non tender.   Ext: No edema  MS: Adequate ROM spine, shoulders, hips and knees.  Skin: Intact, no ulcerations or rash noted.  Psych: Good eye contact, normal affect. Memory intact not anxious or depressed appearing.  CNS: CN 2-12 intact, power,  normal throughout.no focal deficits noted.   Assessment & Plan   Essential  hypertension Controlled, no change in medication DASH diet and commitment to daily physical activity for a minimum of 30 minutes discussed and encouraged, as a part of hypertension management. The importance of attaining a healthy weight is also discussed.  BP/Weight 10/08/2014 06/06/2014 02/05/2014 01/25/2014 09/13/2013 08/16/2013 03/08/7508  Systolic BP 258 527 782 423 536 144 315  Diastolic BP 82 82 74 78 82 84 76  Wt. (Lbs) 148.12 150 - 150 144.8 - 143  BMI 25.41 25.73 - 25.73 24.84 - 24.53        Diabetes mellitus, insulin dependent (IDDM), controlled ([place]) [name] is reminded of the importance of commitment to daily physical activity for 30 minutes or more, as able and the need to limit carbohydrate intake to 30 to 60 grams per meal to help with blood sugar control.   The need to take medication as prescribed, test blood sugar as directed, and to call between visits if there is a concern that blood sugar is uncontrolled is also discussed.   [name] is reminded of the importance of daily foot exam, annual eye examination, and good blood sugar, blood pressure and cholesterol control. Deteriorated , increase dose of insulin  Diabetic Labs Latest Ref Rng 10/07/2014 06/05/2014 01/22/2014 09/11/2013 05/11/2013  HbA1c <5.7 % 7.9(H) 7.3(H) 8.0(H) 7.7(H) 7.5(H)  Microalbumin <2.0 mg/dL - - 1.0 - -  Micro/Creat Ratio 0.0 - 30.0 mg/g - - 24.5 - -  Chol 0 - 200 mg/dL - 200 229(H) 234(H) -  HDL >=46 mg/dL - 87 76 88 -  Calc LDL 0 - 99 mg/dL - 102(H) 141(H) 133(H) -  Triglycerides <150 mg/dL - 54 59 63 -  Creatinine 0.50 - 0.99 mg/dL 0.53 0.54 - 0.57 0.52   BP/Weight 10/08/2014 06/06/2014 02/05/2014 01/25/2014 09/13/2013 08/16/2013 0/24/0973  Systolic BP 532 992 426 834 196 222 979  Diastolic BP 82 82 74 78 82 84 76  Wt. (Lbs) 148.12 150 - 150 144.8 - 143  BMI 25.41 25.73 - 25.73 24.84 - 24.53   Foot/eye exam completion dates 01/25/2014 09/19/2012  Foot exam Order - -  Foot Form Completion Done Done          Hyperlipidemia LDL goal <100 Hyperlipidemia:Low fat diet discussed and encouraged.   Lipid Panel  Lab Results  Component Value Date   CHOL 200 06/05/2014   HDL 87 06/05/2014   LDLCALC 102* 06/05/2014   TRIG 54 06/05/2014   CHOLHDL 2.3 06/05/2014   Updated lab needed at/ before next visit.     Vaccine refused by patient Again counseled re need for u[pdate immunization, continues to refuse , will address in the future again       Review of Systems     Objective:   Physical Exam        Assessment & Plan:

## 2014-12-11 NOTE — Assessment & Plan Note (Signed)
Hyperlipidemia:Low fat diet discussed and encouraged.   Lipid Panel  Lab Results  Component Value Date   CHOL 200 06/05/2014   HDL 87 06/05/2014   LDLCALC 102* 06/05/2014   TRIG 54 06/05/2014   CHOLHDL 2.3 06/05/2014   Updated lab needed at/ before next visit.

## 2014-12-11 NOTE — Assessment & Plan Note (Addendum)
[name] is reminded of the importance of commitment to daily physical activity for 30 minutes or more, as able and the need to limit carbohydrate intake to 30 to 60 grams per meal to help with blood sugar control.   The need to take medication as prescribed, test blood sugar as directed, and to call between visits if there is a concern that blood sugar is uncontrolled is also discussed.   [name] is reminded of the importance of daily foot exam, annual eye examination, and good blood sugar, blood pressure and cholesterol control. Deteriorated , increase dose of insulin  Diabetic Labs Latest Ref Rng 10/07/2014 06/05/2014 01/22/2014 09/11/2013 05/11/2013  HbA1c <5.7 % 7.9(H) 7.3(H) 8.0(H) 7.7(H) 7.5(H)  Microalbumin <2.0 mg/dL - - 1.0 - -  Micro/Creat Ratio 0.0 - 30.0 mg/g - - 24.5 - -  Chol 0 - 200 mg/dL - 200 229(H) 234(H) -  HDL >=46 mg/dL - 87 76 88 -  Calc LDL 0 - 99 mg/dL - 102(H) 141(H) 133(H) -  Triglycerides <150 mg/dL - 54 59 63 -  Creatinine 0.50 - 0.99 mg/dL 0.53 0.54 - 0.57 0.52   BP/Weight 10/08/2014 06/06/2014 02/05/2014 01/25/2014 09/13/2013 08/16/2013 08/12/3708  Systolic BP 626 948 546 270 350 093 818  Diastolic BP 82 82 74 78 82 84 76  Wt. (Lbs) 148.12 150 - 150 144.8 - 143  BMI 25.41 25.73 - 25.73 24.84 - 24.53   Foot/eye exam completion dates 01/25/2014 09/19/2012  Foot exam Order - -  Foot Form Completion Done Done

## 2014-12-11 NOTE — Assessment & Plan Note (Signed)
Controlled, no change in medication DASH diet and commitment to daily physical activity for a minimum of 30 minutes discussed and encouraged, as a part of hypertension management. The importance of attaining a healthy weight is also discussed.  BP/Weight 10/08/2014 06/06/2014 02/05/2014 01/25/2014 09/13/2013 08/16/2013 1/59/4585  Systolic BP 929 244 628 638 177 116 579  Diastolic BP 82 82 74 78 82 84 76  Wt. (Lbs) 148.12 150 - 150 144.8 - 143  BMI 25.41 25.73 - 25.73 24.84 - 24.53

## 2014-12-21 ENCOUNTER — Other Ambulatory Visit: Payer: Self-pay | Admitting: Family Medicine

## 2015-01-02 ENCOUNTER — Other Ambulatory Visit: Payer: Self-pay | Admitting: Family Medicine

## 2015-03-13 DIAGNOSIS — E109 Type 1 diabetes mellitus without complications: Secondary | ICD-10-CM | POA: Diagnosis not present

## 2015-03-13 DIAGNOSIS — E785 Hyperlipidemia, unspecified: Secondary | ICD-10-CM | POA: Diagnosis not present

## 2015-03-13 DIAGNOSIS — E1065 Type 1 diabetes mellitus with hyperglycemia: Secondary | ICD-10-CM | POA: Diagnosis not present

## 2015-03-13 LAB — COMPLETE METABOLIC PANEL WITH GFR
ALT: 14 U/L (ref 6–29)
AST: 11 U/L (ref 10–35)
Albumin: 4.5 g/dL (ref 3.6–5.1)
Alkaline Phosphatase: 118 U/L (ref 33–130)
BUN: 18 mg/dL (ref 7–25)
CO2: 26 mmol/L (ref 20–31)
Calcium: 9.5 mg/dL (ref 8.6–10.4)
Chloride: 102 mmol/L (ref 98–110)
Creat: 0.6 mg/dL (ref 0.50–0.99)
GFR, Est African American: >89 mL/min (ref >=60)
GFR, Est Non African American: >89 mL/min (ref >=60)
Glucose, Bld: 145 mg/dL — ABNORMAL HIGH (ref 65–99)
Potassium: 4.4 mmol/L (ref 3.5–5.3)
Sodium: 137 mmol/L (ref 135–146)
Total Bilirubin: 0.4 mg/dL (ref 0.2–1.2)
Total Protein: 7.2 g/dL (ref 6.1–8.1)

## 2015-03-13 LAB — LIPID PANEL
Cholesterol: 226 mg/dL — ABNORMAL HIGH (ref 125–200)
HDL: 89 mg/dL (ref >=46)
LDL Cholesterol: 122 mg/dL (ref <130)
Total CHOL/HDL Ratio: 2.5 Ratio (ref <=5.0)
Triglycerides: 76 mg/dL (ref <150)
VLDL: 15 mg/dL (ref <30)

## 2015-03-14 ENCOUNTER — Ambulatory Visit (INDEPENDENT_AMBULATORY_CARE_PROVIDER_SITE_OTHER): Payer: Medicare Other | Admitting: Family Medicine

## 2015-03-14 ENCOUNTER — Encounter: Payer: Self-pay | Admitting: Family Medicine

## 2015-03-14 VITALS — BP 140/88 | HR 85 | Resp 16 | Ht 64.0 in | Wt 152.0 lb

## 2015-03-14 DIAGNOSIS — Z794 Long term (current) use of insulin: Secondary | ICD-10-CM

## 2015-03-14 DIAGNOSIS — I10 Essential (primary) hypertension: Secondary | ICD-10-CM | POA: Diagnosis not present

## 2015-03-14 DIAGNOSIS — E119 Type 2 diabetes mellitus without complications: Secondary | ICD-10-CM | POA: Diagnosis not present

## 2015-03-14 DIAGNOSIS — E785 Hyperlipidemia, unspecified: Secondary | ICD-10-CM | POA: Diagnosis not present

## 2015-03-14 DIAGNOSIS — Z23 Encounter for immunization: Secondary | ICD-10-CM | POA: Diagnosis not present

## 2015-03-14 DIAGNOSIS — IMO0001 Reserved for inherently not codable concepts without codable children: Secondary | ICD-10-CM

## 2015-03-14 LAB — HEMOGLOBIN A1C
Hgb A1c MFr Bld: 8.9 % — ABNORMAL HIGH (ref <5.7)
Mean Plasma Glucose: 209 mg/dL — ABNORMAL HIGH (ref <117)

## 2015-03-14 NOTE — Progress Notes (Signed)
Subjective:    Patient ID: [name], female    DOB: [birth year], 65 y.o.   MRN: [id number]  HPI   [name]     MRN: [id number]      DOB: 09/27/1950   HPI [name] is here for follow up and re-evaluation of chronic medical conditions, medication management and review of any available recent lab and radiology data.  Preventive health is updated, specifically  Cancer screening and Immunization.   Questions or concerns regarding consultations or procedures which the PT has had in the interim are  addressed. The PT denies any adverse reactions to current medications since the last visit.  C/o intermittent episodes of blurred vision and light headedness in past 1 week, generally awakening her from sleep, unfortunately , her blood sugar is markedly increased which is the likely problem  Denies polyuria, polydipsia, blurred vision , or hypoglycemic episodes. Does report that she has been non compliant with diet, and has not been testing regularly, plans to change this  ROS Denies recent fever or chills. Denies sinus pressure, nasal congestion, ear pain or sore throat. Denies chest congestion, productive cough or wheezing. Denies chest pains, palpitations and leg swelling Denies abdominal pain, nausea, vomiting,diarrhea or constipation.   Denies dysuria, frequency, hesitancy or incontinence. Denies joint pain, swelling and limitation in mobility. Denies headaches, seizures, numbness, or tingling. Denies depression, anxiety or insomnia.  PE  BP 140/88 mmHg  Pulse 85  Resp 16  Ht 5\' 4"  (1.626 m)  Wt 152 lb (68.947 kg)  BMI 26.08 kg/m2  SpO2 98%  Patient alert and oriented and in no cardiopulmonary distress.  HEENT: No facial asymmetry, EOMI,   oropharynx pink and moist.  Neck supple no JVD, no mass.  Chest: Clear to auscultation bilaterally.  CVS: S1, S2 no murmurs, no S3.Regular rate.  ABD: Soft non tender.   Ext: No edema  MS: Adequate ROM spine,  shoulders, hips and knees.  Skin: Intact, no ulcerations has tinea pedis  Psych: Good eye contact, normal affect. Memory intact mildly  anxious or depressed appearing.  CNS: CN 2-12 intact, power,  normal throughout.no focal deficits noted.   Assessment & Plan   Diabetes mellitus, insulin dependent (IDDM), controlled ([facility]) Deteriorated, no med changes, pt will be more diligent with diet [name] is reminded of the importance of commitment to daily physical activity for 30 minutes or more, as able and the need to limit carbohydrate intake to 30 to 60 grams per meal to help with blood sugar control.   The need to take medication as prescribed, test blood sugar as directed, and to call between visits if there is a concern that blood sugar is uncontrolled is also discussed.   [name] is reminded of the importance of daily foot exam, annual eye examination, and good blood sugar, blood pressure and cholesterol control.  Diabetic Labs Latest Ref Rng 03/14/2015 03/13/2015 10/07/2014 06/05/2014 01/22/2014  HbA1c <5.7 % - 8.9(H) 7.9(H) 7.3(H) 8.0(H)  Microalbumin Not estab mg/dL 2.5 - - - 1.0  Micro/Creat Ratio <30 mcg/mg creat 43(H) - - - 24.5  Chol 125 - 200 mg/dL - 226(H) - 200 229(H)  HDL >=46 mg/dL - 89 - 87 76  Calc LDL <130 mg/dL - 122 - 102(H) 141(H)  Triglycerides <150 mg/dL - 76 - 54 59  Creatinine 0.50 - 0.99 mg/dL - 0.60 0.53 0.54 -   BP/Weight 03/14/2015 10/08/2014 06/06/2014 02/05/2014 01/25/2014 09/13/2013 123456  Systolic BP XX123456 0000000 XX123456 123XX123 130  Q000111Q Q000111Q  Diastolic BP 88 82 82 74 78 82 84  Wt. (Lbs) 152 148.12 150 - 150 144.8 -  BMI 26.08 25.41 25.73 - 25.73 24.84 -   Foot/eye exam completion dates 01/25/2014 09/19/2012  Foot exam Order - -  Foot Form Completion Done Done         Essential hypertension UnControlled, no change in medication DASH diet and commitment to daily physical activity for a minimum of 30 minutes discussed and encouraged, as a part of hypertension  management. The importance of attaining a healthy weight is also discussed.  BP/Weight 03/14/2015 10/08/2014 06/06/2014 02/05/2014 01/25/2014 09/13/2013 123456  Systolic BP XX123456 0000000 XX123456 [id number] Q000111Q Q000111Q  Diastolic BP 88 82 82 74 78 82 84  Wt. (Lbs) 152 148.12 150 - 150 144.8 -  BMI 26.08 25.41 25.73 - 25.73 24.84 -        Hyperlipidemia LDL goal <100 Hyperlipidemia:Low fat diet discussed and encouraged.   Lipid Panel  Lab Results  Component Value Date   CHOL 226* 03/13/2015   HDL 89 03/13/2015   LDLCALC 122 03/13/2015   TRIG 76 03/13/2015   CHOLHDL 2.5 03/13/2015  Improved though still not at goal, no med change at this time         Assessment:     Plan:         Review of Systems     Objective:   Physical Exam        Assessment & Plan:

## 2015-03-14 NOTE — Patient Instructions (Signed)
F/u in 3.5 month, call if you need me sooner  PLEASE follow eating plan and test and record at least twice daily  Try to exercise for 15 minutes after each meal, this helps blood sugar   Call nurse weekly with numbers   Goal for fasting blood sugar ranges from 80 to 130 and 2 hours after any meal or at bedtime should be between 130 to 180.   HBA1C, chem 7 and EGFR in 3.5 month  You are referred to dr Iona Hansen for diabetic eye exam,  Thanks for choosing [facility], we consider it a privelige to serve you.  All the best for 2017!

## 2015-03-15 LAB — MICROALBUMIN / CREATININE URINE RATIO
Creatinine, Urine: 58 mg/dL (ref 20–320)
Microalb Creat Ratio: 43 mcg/mg creat — ABNORMAL HIGH (ref <30)
Microalb, Ur: 2.5 mg/dL

## 2015-03-15 NOTE — Assessment & Plan Note (Signed)
Hyperlipidemia:Low fat diet discussed and encouraged.   Lipid Panel  Lab Results  Component Value Date   CHOL 226* 03/13/2015   HDL 89 03/13/2015   LDLCALC 122 03/13/2015   TRIG 76 03/13/2015   CHOLHDL 2.5 03/13/2015  Improved though still not at goal, no med change at this time

## 2015-03-15 NOTE — Assessment & Plan Note (Signed)
Deteriorated, no med changes, pt will be more diligent with diet [name] is reminded of the importance of commitment to daily physical activity for 30 minutes or more, as able and the need to limit carbohydrate intake to 30 to 60 grams per meal to help with blood sugar control.   The need to take medication as prescribed, test blood sugar as directed, and to call between visits if there is a concern that blood sugar is uncontrolled is also discussed.   [name] is reminded of the importance of daily foot exam, annual eye examination, and good blood sugar, blood pressure and cholesterol control.  Diabetic Labs Latest Ref Rng 03/14/2015 03/13/2015 10/07/2014 06/05/2014 01/22/2014  HbA1c <5.7 % - 8.9(H) 7.9(H) 7.3(H) 8.0(H)  Microalbumin Not estab mg/dL 2.5 - - - 1.0  Micro/Creat Ratio <30 mcg/mg creat 43(H) - - - 24.5  Chol 125 - 200 mg/dL - 226(H) - 200 229(H)  HDL >=46 mg/dL - 89 - 87 76  Calc LDL <130 mg/dL - 122 - 102(H) 141(H)  Triglycerides <150 mg/dL - 76 - 54 59  Creatinine 0.50 - 0.99 mg/dL - 0.60 0.53 0.54 -   BP/Weight 03/14/2015 10/08/2014 06/06/2014 02/05/2014 01/25/2014 09/13/2013 123456  Systolic BP XX123456 0000000 XX123456 123XX123 [id number] Q000111Q Q000111Q  Diastolic BP 88 82 82 74 78 82 84  Wt. (Lbs) 152 148.12 150 - 150 144.8 -  BMI 26.08 25.41 25.73 - 25.73 24.84 -   Foot/eye exam completion dates 01/25/2014 09/19/2012  Foot exam Order - -  Foot Form Completion Done Done

## 2015-03-15 NOTE — Assessment & Plan Note (Signed)
UnControlled, no change in medication DASH diet and commitment to daily physical activity for a minimum of 30 minutes discussed and encouraged, as a part of hypertension management. The importance of attaining a healthy weight is also discussed.  BP/Weight 03/14/2015 10/08/2014 06/06/2014 02/05/2014 01/25/2014 09/13/2013 123456  Systolic BP XX123456 0000000 XX123456 [id number] Q000111Q Q000111Q  Diastolic BP 88 82 82 74 78 82 84  Wt. (Lbs) 152 148.12 150 - 150 144.8 -  BMI 26.08 25.41 25.73 - 25.73 24.84 -

## 2015-03-19 ENCOUNTER — Telehealth: Payer: Self-pay | Admitting: Family Medicine

## 2015-03-19 NOTE — Telephone Encounter (Signed)
pkls let pt know glipizide is considered a safer option for her diabetes than the glyburide, I recommend med change to glipizide , and she will need tio take the metformin separately,   Pls send in glipizide 10 mg one twice daily and metformin 1000 mg one twice daily in place of the glyburide metformin  if she agrees, send in 4 months and make changes in record  ??? pls ask

## 2015-03-20 NOTE — Telephone Encounter (Signed)
Called patient and left message for them to return call at the office   

## 2015-03-21 ENCOUNTER — Telehealth: Payer: Self-pay

## 2015-03-21 MED ORDER — AMLODIPINE BESYLATE 5 MG PO TABS: 5.0000 mg | ORAL_TABLET | Freq: Every day | ORAL | Status: DC

## 2015-03-21 MED ORDER — METFORMIN HCL 1000 MG PO TABS
1000.0000 mg | ORAL_TABLET | Freq: Two times a day (BID) | ORAL | Status: DC
Start: 2015-03-21 — End: 2015-10-18

## 2015-03-21 MED ORDER — GLIPIZIDE 10 MG PO TABS: 10.0000 mg | ORAL_TABLET | Freq: Two times a day (BID) | ORAL | Status: DC

## 2015-03-21 NOTE — Telephone Encounter (Signed)
Pt understands and meds sent

## 2015-03-21 NOTE — Telephone Encounter (Signed)
Called patient and left message for them to return call at the office   

## 2015-03-21 NOTE — Telephone Encounter (Signed)
ould remain on same med dose. Encourage her to commit to the exercise and nightly checking, number look improved

## 2015-03-21 NOTE — Telephone Encounter (Signed)
Fasting 107,120, 154,120,113,166,141,90  Bedtime 143,155,125 - hasn't been checking blood sugars everynight but is going to start. Has been trying to do better with her eating and is going to commit to watching her portions and her sweets and carbs

## 2015-03-21 NOTE — Addendum Note (Signed)
Addended by: Eual Fines on: 03/21/2015 11:12 AM   Modules accepted: Orders, Medications

## 2015-03-28 NOTE — Addendum Note (Signed)
Addended by: [name] E on: 03/28/2015 11:59 AM   Modules accepted: Level of Service

## 2015-03-28 NOTE — Progress Notes (Signed)
   Subjective:    Patient ID: [name], female    DOB: 08/24/1950, 65 y.o.   MRN: [id number]  HPI    Review of Systems     Objective:   Physical Exam        Assessment & Plan:

## 2015-04-04 ENCOUNTER — Telehealth: Payer: Self-pay

## 2015-04-04 NOTE — Telephone Encounter (Signed)
Fasting- [phone]  Numbers looking better- Patient encouraged to go to the nutritionist because she is having a hard time planning her meals and eating on a schedule and she said she would wait and let me know next week.

## 2015-04-04 NOTE — Telephone Encounter (Signed)
I reminded her of the times to test- she was testing after not eating since breakfast and right before eating supper. I reminded her it is 2 hours after a meal or at bedtime. She understands to have a snack when that low

## 2015-04-04 NOTE — Telephone Encounter (Signed)
I agree with your recommendation re educator and assessment. No change in med Let her know of concern re low bedtime numbers twice and review need to eat on schedule , also when low needs to have a snack before bed so does not "bottom out" 109 and 64?? Are TOO low for bedtime, and 64 too low at any time

## 2015-05-20 ENCOUNTER — Other Ambulatory Visit: Payer: Self-pay | Admitting: Family Medicine

## 2015-05-20 DIAGNOSIS — Z794 Long term (current) use of insulin: Principal | ICD-10-CM

## 2015-05-20 DIAGNOSIS — IMO0001 Reserved for inherently not codable concepts without codable children: Secondary | ICD-10-CM

## 2015-05-20 DIAGNOSIS — E119 Type 2 diabetes mellitus without complications: Principal | ICD-10-CM

## 2015-05-20 NOTE — Telephone Encounter (Signed)
Needs HBA1C, chem 7 and EGFr April 5, and move appt up to April 7 or shortly after will see what adjustment then If ABSOLUTELY intolerant well  She will have to reduce the fose but based on recent lab I hesitate to  Recommend that 

## 2015-05-20 NOTE — Telephone Encounter (Signed)
Patient states she thinks her dosage needs to be changed with her Metformin, please advise?

## 2015-05-20 NOTE — Telephone Encounter (Signed)
States the metformin dose is too high and its causing her side effects. Headaches, blurry vision, and makes her feel bad. Wants the dose reduced to 500mg  bid. Please advise

## 2015-05-22 NOTE — Telephone Encounter (Signed)
Patient aware.  Labs mailed to home address.   Appointment changed to 4/13

## 2015-05-22 NOTE — Addendum Note (Signed)
Addended by: Denman George B on: 05/22/2015 02:34 PM   Modules accepted: Orders

## 2015-06-16 DIAGNOSIS — E119 Type 2 diabetes mellitus without complications: Secondary | ICD-10-CM | POA: Diagnosis not present

## 2015-06-16 DIAGNOSIS — Z794 Long term (current) use of insulin: Secondary | ICD-10-CM | POA: Diagnosis not present

## 2015-06-17 LAB — BASIC METABOLIC PANEL WITH GFR
BUN: 14 mg/dL (ref 7–25)
CO2: 28 mmol/L (ref 20–31)
Calcium: 9.4 mg/dL (ref 8.6–10.4)
Chloride: 99 mmol/L (ref 98–110)
Creat: 0.61 mg/dL (ref 0.50–0.99)
GFR, Est African American: >89 mL/min (ref >=60)
GFR, Est Non African American: >89 mL/min (ref >=60)
Glucose, Bld: 86 mg/dL (ref 65–99)
Potassium: 4.3 mmol/L (ref 3.5–5.3)
Sodium: 139 mmol/L (ref 135–146)

## 2015-06-17 LAB — HEMOGLOBIN A1C
Hgb A1c MFr Bld: 7.5 % — ABNORMAL HIGH (ref <5.7)
Mean Plasma Glucose: 169 mg/dL

## 2015-06-19 ENCOUNTER — Ambulatory Visit: Payer: Medicare Other | Admitting: Family Medicine

## 2015-06-19 ENCOUNTER — Other Ambulatory Visit: Payer: Self-pay

## 2015-06-19 ENCOUNTER — Telehealth: Payer: Self-pay

## 2015-06-19 MED ORDER — MUPIROCIN 2 % EX OINT: 1.0000 "application " | TOPICAL_OINTMENT | Freq: Two times a day (BID) | CUTANEOUS | Status: DC

## 2015-06-19 MED ORDER — MUPIROCIN 2 % EX OINT: TOPICAL_OINTMENT | CUTANEOUS | Status: DC

## 2015-06-19 MED ORDER — DOXYCYCLINE HYCLATE 100 MG PO TABS: 100.0000 mg | ORAL_TABLET | Freq: Two times a day (BID) | ORAL | Status: DC

## 2015-06-19 NOTE — Telephone Encounter (Signed)
Came in today for appt but she wasn't on the schedule so she decided to come back at her appt time but she has a quarter sized boil on her back and wanted a refill of the doxycycline because she didn't want to go back to dermatology. Said you told her one time something to put on it to make it come to a head but she couldn't remember what it was. Wants to know.  No drainage but sore and tender and wants doxy so she can wait it out until her may appt

## 2015-06-19 NOTE — Telephone Encounter (Signed)
Patient aware and meds sent 

## 2015-06-19 NOTE — Telephone Encounter (Signed)
pls let her know that doxycycline and bactroban have been sent in

## 2015-07-11 ENCOUNTER — Ambulatory Visit: Payer: Medicare Other | Admitting: Family Medicine

## 2015-07-22 ENCOUNTER — Other Ambulatory Visit: Payer: Self-pay | Admitting: Family Medicine

## 2015-08-15 ENCOUNTER — Other Ambulatory Visit: Payer: Self-pay

## 2015-08-15 MED ORDER — INSULIN GLARGINE 100 UNIT/ML SOLOSTAR PEN: PEN_INJECTOR | SUBCUTANEOUS | Status: DC

## 2015-09-15 ENCOUNTER — Other Ambulatory Visit: Payer: Self-pay

## 2015-09-15 MED ORDER — AMLODIPINE BESYLATE 5 MG PO TABS: 5.0000 mg | ORAL_TABLET | Freq: Every day | ORAL | Status: DC

## 2015-09-24 ENCOUNTER — Other Ambulatory Visit: Payer: Self-pay | Admitting: Family Medicine

## 2015-10-11 ENCOUNTER — Other Ambulatory Visit: Payer: Self-pay | Admitting: Family Medicine

## 2015-10-18 ENCOUNTER — Other Ambulatory Visit: Payer: Self-pay | Admitting: Family Medicine

## 2015-10-20 ENCOUNTER — Other Ambulatory Visit: Payer: Self-pay | Admitting: Family Medicine

## 2015-10-20 ENCOUNTER — Telehealth: Payer: Self-pay | Admitting: Family Medicine

## 2015-10-20 ENCOUNTER — Other Ambulatory Visit: Payer: Self-pay

## 2015-10-20 MED ORDER — METFORMIN HCL 1000 MG PO TABS: 1000.0000 mg | ORAL_TABLET | Freq: Two times a day (BID) | ORAL | 0 refills | Status: DC

## 2015-10-20 NOTE — Telephone Encounter (Signed)
Patient needs appointment.

## 2015-10-20 NOTE — Telephone Encounter (Signed)
Patient given appointment for follow up.  Medication refilled.

## 2015-10-20 NOTE — Telephone Encounter (Signed)
Patient is needing a refill on her metFORMIN (GLUCOPHAGE) 1000 MG tablet she is completely out of medication

## 2015-10-24 ENCOUNTER — Other Ambulatory Visit: Payer: Self-pay | Admitting: Family Medicine

## 2015-11-20 ENCOUNTER — Telehealth: Payer: Self-pay | Admitting: Family Medicine

## 2015-11-20 NOTE — Telephone Encounter (Signed)
Called to reschedule - Dr. Simpson out of town °

## 2015-11-27 ENCOUNTER — Ambulatory Visit: Payer: Medicare Other | Admitting: Family Medicine

## 2016-01-04 ENCOUNTER — Other Ambulatory Visit: Payer: Self-pay | Admitting: Family Medicine

## 2016-01-20 ENCOUNTER — Telehealth: Payer: Self-pay

## 2016-01-20 ENCOUNTER — Other Ambulatory Visit: Payer: Self-pay | Admitting: Family Medicine

## 2016-01-20 DIAGNOSIS — IMO0001 Reserved for inherently not codable concepts without codable children: Secondary | ICD-10-CM

## 2016-01-20 DIAGNOSIS — I10 Essential (primary) hypertension: Secondary | ICD-10-CM

## 2016-01-20 DIAGNOSIS — E119 Type 2 diabetes mellitus without complications: Principal | ICD-10-CM

## 2016-01-20 DIAGNOSIS — Z794 Long term (current) use of insulin: Principal | ICD-10-CM

## 2016-01-20 NOTE — Telephone Encounter (Signed)
Expired labs reordered  

## 2016-01-22 ENCOUNTER — Encounter: Payer: Self-pay | Admitting: Family Medicine

## 2016-01-22 ENCOUNTER — Ambulatory Visit (INDEPENDENT_AMBULATORY_CARE_PROVIDER_SITE_OTHER): Payer: Medicare Other | Admitting: Family Medicine

## 2016-01-22 VITALS — BP 130/78 | HR 92 | Temp 98.1°F | Resp 16 | Ht 66.0 in | Wt 149.1 lb

## 2016-01-22 DIAGNOSIS — I10 Essential (primary) hypertension: Secondary | ICD-10-CM

## 2016-01-22 DIAGNOSIS — E119 Type 2 diabetes mellitus without complications: Secondary | ICD-10-CM

## 2016-01-22 DIAGNOSIS — R0789 Other chest pain: Secondary | ICD-10-CM

## 2016-01-22 DIAGNOSIS — Z794 Long term (current) use of insulin: Secondary | ICD-10-CM

## 2016-01-22 DIAGNOSIS — E785 Hyperlipidemia, unspecified: Secondary | ICD-10-CM | POA: Diagnosis not present

## 2016-01-22 DIAGNOSIS — Z1231 Encounter for screening mammogram for malignant neoplasm of breast: Secondary | ICD-10-CM | POA: Diagnosis not present

## 2016-01-22 DIAGNOSIS — IMO0001 Reserved for inherently not codable concepts without codable children: Secondary | ICD-10-CM

## 2016-01-22 LAB — BASIC METABOLIC PANEL WITH GFR
BUN: 16 mg/dL (ref 7–25)
CO2: 29 mmol/L (ref 20–31)
Calcium: 9.4 mg/dL (ref 8.6–10.4)
Chloride: 99 mmol/L (ref 98–110)
Creat: 0.55 mg/dL (ref 0.50–0.99)
GFR, Est African American: >89 mL/min (ref >=60)
GFR, Est Non African American: >89 mL/min (ref >=60)
Glucose, Bld: 89 mg/dL (ref 65–99)
Potassium: 3.8 mmol/L (ref 3.5–5.3)
Sodium: 137 mmol/L (ref 135–146)

## 2016-01-22 LAB — HEMOGLOBIN A1C
Hgb A1c MFr Bld: 7.3 % — ABNORMAL HIGH (ref <5.7)
Mean Plasma Glucose: 163 mg/dL

## 2016-01-22 MED ORDER — IBUPROFEN 800 MG PO TABS: ORAL_TABLET | ORAL | 0 refills | Status: DC

## 2016-01-22 MED ORDER — GABAPENTIN 100 MG PO CAPS: 100.0000 mg | ORAL_CAPSULE | Freq: Every day | ORAL | 3 refills | Status: DC

## 2016-01-22 MED ORDER — PREDNISONE 5 MG PO TABS: 5.0000 mg | ORAL_TABLET | Freq: Two times a day (BID) | ORAL | 0 refills | Status: AC

## 2016-01-22 NOTE — Assessment & Plan Note (Signed)
Controlled, no change in medication DASH diet and commitment to daily physical activity for a minimum of 30 minutes discussed and encouraged, as a part of hypertension management. The importance of attaining a healthy weight is also discussed.  BP/Weight 01/22/2016 03/14/2015 10/08/2014 06/06/2014 02/05/2014 [id number]  Systolic BP [id number] XX123456 0000000 XX123456 [id number] Q000111Q  Diastolic BP 78 88 82 82 74 78 82  Wt. (Lbs) 149.12 152 148.12 150 - 150 144.8  BMI 24.07 26.08 25.41 25.73 - 25.73 24.84

## 2016-01-22 NOTE — Assessment & Plan Note (Signed)
Controlled, no change in medication [name] is reminded of the importance of commitment to daily physical activity for 30 minutes or more, as able and the need to limit carbohydrate intake to 30 to 60 grams per meal to help with blood sugar control.   The need to take medication as prescribed, test blood sugar as directed, and to call between visits if there is a concern that blood sugar is uncontrolled is also discussed.   [name] is reminded of the importance of daily foot exam, annual eye examination, and good blood sugar, blood pressure and cholesterol control.  Diabetic Labs Latest Ref Rng & Units 01/20/2016 06/16/2015 03/14/2015 03/13/2015 10/07/2014  HbA1c <5.7 % 7.3(H) 7.5(H) - 8.9(H) 7.9(H)  Microalbumin Not estab mg/dL - - 2.5 - -  Micro/Creat Ratio <30 mcg/mg creat - - 43(H) - -  Chol 125 - 200 mg/dL - - - 226(H) -  HDL >=46 mg/dL - - - 89 -  Calc LDL <130 mg/dL - - - 122 -  Triglycerides <150 mg/dL - - - 76 -  Creatinine 0.50 - 0.99 mg/dL 0.55 0.61 - 0.60 0.53   BP/Weight 01/22/2016 03/14/2015 10/08/2014 06/06/2014 02/05/2014 [id number]  Systolic BP [id number] XX123456 0000000 XX123456 [id number] Q000111Q  Diastolic BP 78 88 82 82 74 78 82  Wt. (Lbs) 149.12 152 148.12 150 - 150 144.8  BMI 24.07 26.08 25.41 25.73 - 25.73 24.84   Foot/eye exam completion dates 03/14/2015 01/25/2014  Foot exam Order - -  Foot Form Completion Done Done

## 2016-01-22 NOTE — Patient Instructions (Addendum)
Annual wellness in December, call if you need me sooner  Annual physical exam in 4 months  Fasting lipid , cmp and eGFR, hBA1C in 4 month  Congrats on improved blood sugar and good blood pressure    You NEED diagnostic mammogram , 1 year past due pls ensure you keep appt  You will be referred to cardiology for evaluation per our discussion Reconsider flu vaccine  We will recommend statin dose , once cholesterol value is determined For right neck pain, prednisone, ibuprofen and gabapentin are prescribed, call in 2 week if no improvement in pain

## 2016-01-22 NOTE — Progress Notes (Signed)
[name]     MRN: [id number]      DOB: [birth year]   HPI [name] is here for follow up and re-evaluation of chronic medical conditions, medication management and review of any available recent lab and radiology data.  Preventive health is updated, specifically  Cancer screening and Immunization.  Refusing flu vaccine at visit Questions or concerns regarding consultations or procedures which the PT has had in the interim are  addressed. The PT denies any adverse reactions to current medications since the last visit.  C/o intermittent left chest pain, in past several months, request cardio loy eval, has not been in over 5 years and has significant rist based on  personal and family  Denies polyuria, polydipsia, blurred vision , or hypoglycemic episodes. C/o 3 week h/o increased right neck and RUE pain with numbness and burning, disturbs sleep, has established C spine disease   ROS Denies recent fever or chills. Denies sinus pressure, nasal congestion, ear pain or sore throat. Denies chest congestion, productive cough or wheezing. Denies pND , orthopnea  and leg swelling Denies abdominal pain, nausea, vomiting,diarrhea or constipation.   Denies dysuria, frequency, hesitancy or incontinence. Denies depression, anxiety or insomnia. Denies skin break down or rash.   PE  BP 130/78   Pulse 92   Temp 98.1 F (36.7 C) (Oral)   Resp 16   Ht 5\' 6"  (1.676 m)   Wt 149 lb 1.9 oz (67.6 kg)   SpO2 99%   BMI 24.07 kg/m   Patient alert and oriented and in no cardiopulmonary distress.  HEENT: No facial asymmetry, EOMI,   oropharynx pink and moist.  Neck decreased ROM no JVD, no mass.  Chest: Clear to auscultation bilaterally.  CVS: S1, S2 no murmurs, no S3.Regular rate.  ABD: Soft non tender.   Ext: No edema  MS: Adequate ROM spine, shoulders, hips and knees.  Skin: Intact, no ulcerations or rash noted.  Psych: Good eye contact, normal affect. Memory intact not anxious or  depressed appearing.  CNS: CN 2-12 intact, power,  normal throughout.no focal deficits noted.   Assessment & Plan  Essential hypertension Controlled, no change in medication DASH diet and commitment to daily physical activity for a minimum of 30 minutes discussed and encouraged, as a part of hypertension management. The importance of attaining a healthy weight is also discussed.  BP/Weight 01/22/2016 03/14/2015 10/08/2014 06/06/2014 02/05/2014 [id number]  Systolic BP [id number]  Diastolic BP 78 88 82 82 74 78 82  Wt. (Lbs) 149.12 152 148.12 150 - 150 144.8  BMI 24.07 26.08 25.41 25.73 - 25.73 24.84       Diabetes mellitus, insulin dependent (IDDM), controlled (HCC) Controlled, no change in medication [name] is reminded of the importance of commitment to daily physical activity for 30 minutes or more, as able and the need to limit carbohydrate intake to 30 to 60 grams per meal to help with blood sugar control.   The need to take medication as prescribed, test blood sugar as directed, and to call between visits if there is a concern that blood sugar is uncontrolled is also discussed.   [name] is reminded of the importance of daily foot exam, annual eye examination, and good blood sugar, blood pressure and cholesterol control.  Diabetic Labs Latest Ref Rng & Units 01/20/2016 06/16/2015 03/14/2015 03/13/2015 10/07/2014  HbA1c <5.7 % 7.3(H) 7.5(H) - 8.9(H) 7.9(H)  Microalbumin Not estab mg/dL - - 2.5 - -  Micro/Creat Ratio <30 mcg/mg creat - - 43(H) - -  Chol 125 - 200 mg/dL - - - 226(H) -  HDL >=46 mg/dL - - - 89 -  Calc LDL <130 mg/dL - - - 122 -  Triglycerides <150 mg/dL - - - 76 -  Creatinine 0.50 - 0.99 mg/dL 0.55 0.61 - 0.60 0.53   BP/Weight 01/22/2016 03/14/2015 10/08/2014 06/06/2014 02/05/2014 [id number]  Systolic BP [id number] [id number]  Diastolic BP 78 88 82 82 74 78 82  Wt. (Lbs) 149.12 152 148.12 150 - 150 144.8  BMI 24.07 26.08 25.41  25.73 - 25.73 24.84   Foot/eye exam completion dates 03/14/2015 01/25/2014  Foot exam Order - -  Foot Form Completion Done Done        Hyperlipidemia LDL goal <100 uncontroolled Hyperlipidemia:Low fat diet discussed and encouraged.   Lipid Panel  Lab Results  Component Value Date   CHOL 236 (H) 01/20/2016   HDL 87 01/20/2016   LDLCALC 136 (H) 01/20/2016   TRIG 66 01/20/2016   CHOLHDL 2.7 01/20/2016    Med adjustment indicated hope that pt will agree, lab available after visit   Chest pain, atypical Atypical chest pain, substernal and left sided , in patient with multiple CV risk factors, personal and f/h, also c/o intermittent palpaitations Refer for cardiology re eval

## 2016-01-23 ENCOUNTER — Encounter: Payer: Self-pay | Admitting: Family Medicine

## 2016-01-23 LAB — LIPID PANEL
Cholesterol: 236 mg/dL — ABNORMAL HIGH (ref <200)
HDL: 87 mg/dL (ref >50)
LDL Cholesterol: 136 mg/dL — ABNORMAL HIGH (ref <100)
Total CHOL/HDL Ratio: 2.7 Ratio (ref <5.0)
Triglycerides: 66 mg/dL (ref <150)
VLDL: 13 mg/dL (ref <30)

## 2016-01-23 LAB — HEPATIC FUNCTION PANEL
ALT: 12 U/L (ref 6–29)
AST: 13 U/L (ref 10–35)
Albumin: 4.6 g/dL (ref 3.6–5.1)
Alkaline Phosphatase: 102 U/L (ref 33–130)
Bilirubin, Direct: 0.1 mg/dL (ref <=0.2)
Indirect Bilirubin: 0.3 mg/dL (ref 0.2–1.2)
Total Bilirubin: 0.4 mg/dL (ref 0.2–1.2)
Total Protein: 7.3 g/dL (ref 6.1–8.1)

## 2016-01-23 LAB — HEPATITIS PANEL, ACUTE
HCV Ab: NEGATIVE
Hep A IgM: NONREACTIVE
Hep B C IgM: NONREACTIVE
Hepatitis B Surface Ag: NEGATIVE

## 2016-01-25 ENCOUNTER — Encounter: Payer: Self-pay | Admitting: Family Medicine

## 2016-01-25 DIAGNOSIS — R0789 Other chest pain: Secondary | ICD-10-CM | POA: Insufficient documentation

## 2016-01-25 NOTE — Assessment & Plan Note (Signed)
uncontroolled Hyperlipidemia:Low fat diet discussed and encouraged.   Lipid Panel  Lab Results  Component Value Date   CHOL 236 (H) 01/20/2016   HDL 87 01/20/2016   LDLCALC 136 (H) 01/20/2016   TRIG 66 01/20/2016   CHOLHDL 2.7 01/20/2016    Med adjustment indicated hope that pt will agree, lab available after visit

## 2016-01-25 NOTE — Assessment & Plan Note (Signed)
Atypical chest pain, substernal and left sided , in patient with multiple CV risk factors, personal and f/h, also c/o intermittent palpaitations Refer for cardiology re eval

## 2016-01-28 ENCOUNTER — Ambulatory Visit: Payer: Medicare Other | Admitting: Cardiology

## 2016-01-28 ENCOUNTER — Telehealth: Payer: Self-pay

## 2016-01-28 ENCOUNTER — Other Ambulatory Visit: Payer: Self-pay

## 2016-01-28 DIAGNOSIS — E785 Hyperlipidemia, unspecified: Secondary | ICD-10-CM

## 2016-01-28 MED ORDER — ATORVASTATIN CALCIUM 10 MG PO TABS: 10.0000 mg | ORAL_TABLET | Freq: Every day | ORAL | 5 refills | Status: DC

## 2016-01-28 MED ORDER — GLIPIZIDE 10 MG PO TABS: ORAL_TABLET | ORAL | 0 refills | Status: DC

## 2016-01-28 MED ORDER — INSULIN GLARGINE 100 UNIT/ML SOLOSTAR PEN: PEN_INJECTOR | SUBCUTANEOUS | 5 refills | Status: DC

## 2016-01-28 NOTE — Telephone Encounter (Signed)
Repeat lab order mailed to patient.

## 2016-01-28 NOTE — Telephone Encounter (Signed)
-----  Message from Margaret E Simpson, MD sent at 01/25/2016  6:39 AM EST ----- Cholesterol is too high, her liver nd kidney function is excellent, I recommend higher dose of lipitor to 20 mg daily, pls send if she agrees, anmd notify pharmacy Neeeds rept fasting lipid, cmp and EGFr and hBA1C 1 week before March appt pls 

## 2016-01-30 ENCOUNTER — Other Ambulatory Visit: Payer: Self-pay | Admitting: Family Medicine

## 2016-02-16 ENCOUNTER — Ambulatory Visit (INDEPENDENT_AMBULATORY_CARE_PROVIDER_SITE_OTHER): Payer: Medicare Other

## 2016-02-16 ENCOUNTER — Ambulatory Visit: Payer: Medicare Other

## 2016-02-16 ENCOUNTER — Other Ambulatory Visit: Payer: Self-pay | Admitting: Family Medicine

## 2016-02-16 DIAGNOSIS — N3001 Acute cystitis with hematuria: Secondary | ICD-10-CM | POA: Diagnosis not present

## 2016-02-16 LAB — POCT URINALYSIS DIPSTICK
Glucose, UA: NEGATIVE
Ketones, UA: 15
Leukocytes, UA: NEGATIVE
Nitrite, UA: POSITIVE
Protein, UA: >=300
Spec Grav, UA: >=1.03
Urobilinogen, UA: 1
pH, UA: 5.5

## 2016-02-16 MED ORDER — GLUCOSE BLOOD VI STRP: ORAL_STRIP | 0 refills | Status: DC

## 2016-02-16 NOTE — Progress Notes (Signed)
Patient c/o discolored urine.  States urine has been dark x 4 days.  She pushed fluids over the weekend.  No other symptoms.  Urine checked in office. Sent for culture.  Patient will call if she starts to have additional symptoms before culture results return

## 2016-02-17 ENCOUNTER — Other Ambulatory Visit (HOSPITAL_COMMUNITY)
Admission: RE | Admit: 2016-02-17 | Discharge: 2016-02-17 | Disposition: A | Discharge status: Home / Self Care | Payer: Medicare Other | Source: Other Acute Inpatient Hospital | Attending: Family Medicine | Admitting: Family Medicine

## 2016-02-17 DIAGNOSIS — N3001 Acute cystitis with hematuria: Secondary | ICD-10-CM | POA: Diagnosis not present

## 2016-02-18 LAB — URINE CULTURE

## 2016-02-19 ENCOUNTER — Telehealth: Payer: Self-pay

## 2016-02-19 ENCOUNTER — Other Ambulatory Visit (HOSPITAL_COMMUNITY)
Admission: RE | Admit: 2016-02-19 | Discharge: 2016-02-19 | Disposition: A | Discharge status: Home / Self Care | Payer: Medicare Other | Source: Other Acute Inpatient Hospital | Attending: Family Medicine | Admitting: Family Medicine

## 2016-02-19 DIAGNOSIS — R8290 Unspecified abnormal findings in urine: Secondary | ICD-10-CM

## 2016-02-19 NOTE — Telephone Encounter (Signed)
Resubmit for c/s only, no fever, chills , flank pain

## 2016-02-19 NOTE — Addendum Note (Signed)
Addended by: Denman George B on: 02/19/2016 02:36 PM   Modules accepted: Orders

## 2016-02-19 NOTE — Telephone Encounter (Signed)
Patient aware and orders placed.    

## 2016-02-21 LAB — URINE CULTURE: Culture: NO GROWTH

## 2016-02-22 ENCOUNTER — Telehealth: Payer: Self-pay | Admitting: Family Medicine

## 2016-02-22 DIAGNOSIS — R3129 Other microscopic hematuria: Secondary | ICD-10-CM

## 2016-02-22 NOTE — Telephone Encounter (Signed)
pls advise pt that though her urine shows noo infection, it has blood present, needs to see urology for further evaluation. She is referred to [facility] urology in [place]

## 2016-02-23 ENCOUNTER — Telehealth: Payer: Self-pay

## 2016-02-23 DIAGNOSIS — Z794 Long term (current) use of insulin: Secondary | ICD-10-CM

## 2016-02-23 DIAGNOSIS — E119 Type 2 diabetes mellitus without complications: Secondary | ICD-10-CM

## 2016-02-23 DIAGNOSIS — I10 Essential (primary) hypertension: Secondary | ICD-10-CM

## 2016-02-23 DIAGNOSIS — IMO0001 Reserved for inherently not codable concepts without codable children: Secondary | ICD-10-CM

## 2016-02-23 NOTE — Telephone Encounter (Signed)
Pt aware she will be referred to urology and Marliss Czar ann is working on it

## 2016-02-23 NOTE — Telephone Encounter (Signed)
[name] submitted some urine last week and it showed large blood and nitrates but culture was negative for infection and she is still having the dark burgandy colored urine and states she is feeling weak- wants to know what needs to be done. Do you want to refer her to urology for an urgent appt? Please advise

## 2016-02-23 NOTE — Telephone Encounter (Signed)
Pt aware and will go today

## 2016-02-23 NOTE — Telephone Encounter (Signed)
I sent a message to that effect to [name] overt hte weekend, pls check [name]'s msg box and follow through, thanks  Also order cbc , chem 7 and eGFR today on pt

## 2016-02-24 ENCOUNTER — Ambulatory Visit: Payer: Medicare Other

## 2016-02-24 ENCOUNTER — Ambulatory Visit (INDEPENDENT_AMBULATORY_CARE_PROVIDER_SITE_OTHER): Payer: Medicare Other

## 2016-02-24 VITALS — BP 128/88 | HR 88 | Temp 98.3°F | Resp 18 | Ht 63.5 in | Wt 148.1 lb

## 2016-02-24 DIAGNOSIS — R3129 Other microscopic hematuria: Secondary | ICD-10-CM

## 2016-02-24 DIAGNOSIS — Z1239 Encounter for other screening for malignant neoplasm of breast: Secondary | ICD-10-CM

## 2016-02-24 DIAGNOSIS — Z0000 Encounter for general adult medical examination without abnormal findings: Secondary | ICD-10-CM

## 2016-02-24 DIAGNOSIS — R319 Hematuria, unspecified: Secondary | ICD-10-CM | POA: Insufficient documentation

## 2016-02-24 DIAGNOSIS — Z1231 Encounter for screening mammogram for malignant neoplasm of breast: Secondary | ICD-10-CM

## 2016-02-24 HISTORY — DX: Other microscopic hematuria: R31.29

## 2016-02-24 HISTORY — DX: Hematuria, unspecified: R31.9

## 2016-02-24 LAB — CBC
HCT: 38.8 % (ref 35.0–45.0)
Hemoglobin: 12.4 g/dL (ref 11.7–15.5)
MCH: 28.6 pg (ref 27.0–33.0)
MCHC: 32 g/dL (ref 32.0–36.0)
MCV: 89.4 fL (ref 80.0–100.0)
MPV: 9.2 fL (ref 7.5–12.5)
Platelets: 399 10*3/uL (ref 140–400)
RBC: 4.34 MIL/uL (ref 3.80–5.10)
RDW: 13.5 % (ref 11.0–15.0)
WBC: 3.8 10*3/uL (ref 3.8–10.8)

## 2016-02-24 LAB — COMPLETE METABOLIC PANEL WITH GFR
ALT: 12 U/L (ref 6–29)
AST: 13 U/L (ref 10–35)
Albumin: 4.4 g/dL (ref 3.6–5.1)
Alkaline Phosphatase: 97 U/L (ref 33–130)
BUN: 15 mg/dL (ref 7–25)
CO2: 25 mmol/L (ref 20–31)
Calcium: 9.1 mg/dL (ref 8.6–10.4)
Chloride: 100 mmol/L (ref 98–110)
Creat: 0.53 mg/dL (ref 0.50–0.99)
GFR, Est African American: >89 mL/min (ref >=60)
GFR, Est Non African American: >89 mL/min (ref >=60)
Glucose, Bld: 151 mg/dL — ABNORMAL HIGH (ref 65–99)
Potassium: 4 mmol/L (ref 3.5–5.3)
Sodium: 137 mmol/L (ref 135–146)
Total Bilirubin: 0.5 mg/dL (ref 0.2–1.2)
Total Protein: 7.1 g/dL (ref 6.1–8.1)

## 2016-02-24 NOTE — Patient Instructions (Addendum)
Health maintenance: Due for Flu and Pneumonia vaccines. Please come back in when you are feeling better to have these administered. Due for routine Mammogram, ordered today   Abnormal screenings: None   Patient concerns: None   Nurse concerns: Please schedule your mammogram and come back in for flu and pneumonia vaccines once you are feeling better. Please schedule your diabetic eye exam as soon as possible.   Next PCP appt: 05/24/2016 at 9:00 am with Dr. Moshe Cipro  Advance directive discussed with patient today. Copy provided for patient to complete at home and have notarized. Patient agrees to have copy sent to our office once it is complete.    Health Maintenance, Female Introduction Adopting a healthy lifestyle and getting preventive care can go a long way to promote health and wellness. Talk with your health care provider about what schedule of regular examinations is right for you. This is a good chance for you to check in with your provider about disease prevention and staying healthy. In between checkups, there are plenty of things you can do on your own. Experts have done a lot of research about which lifestyle changes and preventive measures are most likely to keep you healthy. Ask your health care provider for more information. Weight and diet Eat a healthy diet  Be sure to include plenty of vegetables, fruits, low-fat dairy products, and lean protein.  Do not eat a lot of foods high in solid fats, added sugars, or salt.  Get regular exercise. This is one of the most important things you can do for your health.  Most adults should exercise for at least 150 minutes each week. The exercise should increase your heart rate and make you sweat (moderate-intensity exercise).  Most adults should also do strengthening exercises at least twice a week. This is in addition to the moderate-intensity exercise. Maintain a healthy weight  Body mass index (BMI) is a measurement that can be  used to identify possible weight problems. It estimates body fat based on height and weight. Your health care provider can help determine your BMI and help you achieve or maintain a healthy weight.  For females 49 years of age and older:  A BMI below 18.5 is considered underweight.  A BMI of 18.5 to 24.9 is normal.  A BMI of 25 to 29.9 is considered overweight.  A BMI of 30 and above is considered obese. Watch levels of cholesterol and blood lipids  You should start having your blood tested for lipids and cholesterol at 66 years of age, then have this test every 5 years.  You may need to have your cholesterol levels checked more often if:  Your lipid or cholesterol levels are high.  You are older than 66 years of age.  You are at high risk for heart disease. Cancer screening Lung Cancer  Lung cancer screening is recommended for adults 52-69 years old who are at high risk for lung cancer because of a history of smoking.  A yearly low-dose CT scan of the lungs is recommended for people who:  Currently smoke.  Have quit within the past 15 years.  Have at least a 30-pack-year history of smoking. A pack year is smoking an average of one pack of cigarettes a day for 1 year.  Yearly screening should continue until it has been 15 years since you quit.  Yearly screening should stop if you develop a health problem that would prevent you from having lung cancer treatment. Breast Cancer  Practice  breast self-awareness. This means understanding how your breasts normally appear and feel.  It also means doing regular breast self-exams. Let your health care provider know about any changes, no matter how small.  If you are in your 20s or 30s, you should have a clinical breast exam (CBE) by a health care provider every 1-3 years as part of a regular health exam.  If you are 85 or older, have a CBE every year. Also consider having a breast X-ray (mammogram) every year.  If you have a  family history of breast cancer, talk to your health care provider about genetic screening.  If you are at high risk for breast cancer, talk to your health care provider about having an MRI and a mammogram every year.  Breast cancer gene (BRCA) assessment is recommended for women who have family members with BRCA-related cancers. BRCA-related cancers include:  Breast.  Ovarian.  Tubal.  Peritoneal cancers.  Results of the assessment will determine the need for genetic counseling and BRCA1 and BRCA2 testing. Cervical Cancer  Your health care provider may recommend that you be screened regularly for cancer of the pelvic organs (ovaries, uterus, and vagina). This screening involves a pelvic examination, including checking for microscopic changes to the surface of your cervix (Pap test). You may be encouraged to have this screening done every 3 years, beginning at age 65.  For women ages 31-65, health care providers may recommend pelvic exams and Pap testing every 3 years, or they may recommend the Pap and pelvic exam, combined with testing for human papilloma virus (HPV), every 5 years. Some types of HPV increase your risk of cervical cancer. Testing for HPV may also be done on women of any age with unclear Pap test results.  Other health care providers may not recommend any screening for nonpregnant women who are considered low risk for pelvic cancer and who do not have symptoms. Ask your health care provider if a screening pelvic exam is right for you.  If you have had past treatment for cervical cancer or a condition that could lead to cancer, you need Pap tests and screening for cancer for at least 20 years after your treatment. If Pap tests have been discontinued, your risk factors (such as having a new sexual partner) need to be reassessed to determine if screening should resume. Some women have medical problems that increase the chance of getting cervical cancer. In these cases, your health  care provider may recommend more frequent screening and Pap tests. Colorectal Cancer  This type of cancer can be detected and often prevented.  Routine colorectal cancer screening usually begins at 66 years of age and continues through 66 years of age.  Your health care provider may recommend screening at an earlier age if you have risk factors for colon cancer.  Your health care provider may also recommend using home test kits to check for hidden blood in the stool.  A small camera at the end of a tube can be used to examine your colon directly (sigmoidoscopy or colonoscopy). This is done to check for the earliest forms of colorectal cancer.  Routine screening usually begins at age 22.  Direct examination of the colon should be repeated every 5-10 years through 66 years of age. However, you may need to be screened more often if early forms of precancerous polyps or small growths are found. Skin Cancer  Check your skin from head to toe regularly.  Tell your health care provider about any  new moles or changes in moles, especially if there is a change in a mole's shape or color.  Also tell your health care provider if you have a mole that is larger than the size of a pencil eraser.  Always use sunscreen. Apply sunscreen liberally and repeatedly throughout the day.  Protect yourself by wearing long sleeves, pants, a wide-brimmed hat, and sunglasses whenever you are outside. Heart disease, diabetes, and high blood pressure  High blood pressure causes heart disease and increases the risk of stroke. High blood pressure is more likely to develop in:  People who have blood pressure in the high end of the normal range (130-139/85-89 mm Hg).  People who are overweight or obese.  People who are African American.  If you are 19-78 years of age, have your blood pressure checked every 3-5 years. If you are 65 years of age or older, have your blood pressure checked every year. You should have  your blood pressure measured twice-once when you are at a hospital or clinic, and once when you are not at a hospital or clinic. Record the average of the two measurements. To check your blood pressure when you are not at a hospital or clinic, you can use:  An automated blood pressure machine at a pharmacy.  A home blood pressure monitor.  If you are between 70 years and 10 years old, ask your health care provider if you should take aspirin to prevent strokes.  Have regular diabetes screenings. This involves taking a blood sample to check your fasting blood sugar level.  If you are at a normal weight and have a low risk for diabetes, have this test once every three years after 67 years of age.  If you are overweight and have a high risk for diabetes, consider being tested at a younger age or more often. Preventing infection Hepatitis B  If you have a higher risk for hepatitis B, you should be screened for this virus. You are considered at high risk for hepatitis B if:  You were born in a country where hepatitis B is common. Ask your health care provider which countries are considered high risk.  Your parents were born in a high-risk country, and you have not been immunized against hepatitis B (hepatitis B vaccine).  You have HIV or AIDS.  You use needles to inject street drugs.  You live with someone who has hepatitis B.  You have had sex with someone who has hepatitis B.  You get hemodialysis treatment.  You take certain medicines for conditions, including cancer, organ transplantation, and autoimmune conditions. Hepatitis C  Blood testing is recommended for:  Everyone born from [birth year] through 1966.  Anyone with known risk factors for hepatitis C. Sexually transmitted infections (STIs)  You should be screened for sexually transmitted infections (STIs) including gonorrhea and chlamydia if:  You are sexually active and are younger than 66 years of age.  You are older than  66 years of age and your health care provider tells you that you are at risk for this type of infection.  Your sexual activity has changed since you were last screened and you are at an increased risk for chlamydia or gonorrhea. Ask your health care provider if you are at risk.  If you do not have HIV, but are at risk, it may be recommended that you take a prescription medicine daily to prevent HIV infection. This is called pre-exposure prophylaxis (PrEP). You are considered at risk if:  You are sexually active and do not regularly use condoms or know the HIV status of your partner(s).  You take drugs by injection.  You are sexually active with a partner who has HIV. Talk with your health care provider about whether you are at high risk of being infected with HIV. If you choose to begin PrEP, you should first be tested for HIV. You should then be tested every 3 months for as long as you are taking PrEP. Pregnancy  If you are premenopausal and you may become pregnant, ask your health care provider about preconception counseling.  If you may become pregnant, take 400 to 800 micrograms (mcg) of folic acid every day.  If you want to prevent pregnancy, talk to your health care provider about birth control (contraception). Osteoporosis and menopause  Osteoporosis is a disease in which the bones lose minerals and strength with aging. This can result in serious bone fractures. Your risk for osteoporosis can be identified using a bone density scan.  If you are 34 years of age or older, or if you are at risk for osteoporosis and fractures, ask your health care provider if you should be screened.  Ask your health care provider whether you should take a calcium or vitamin D supplement to lower your risk for osteoporosis.  Menopause may have certain physical symptoms and risks.  Hormone replacement therapy may reduce some of these symptoms and risks. Talk to your health care provider about whether  hormone replacement therapy is right for you. Follow these instructions at home:  Schedule regular health, dental, and eye exams.  Stay current with your immunizations.  Do not use any tobacco products including cigarettes, chewing tobacco, or electronic cigarettes.  If you are pregnant, do not drink alcohol.  If you are breastfeeding, limit how much and how often you drink alcohol.  Limit alcohol intake to no more than 1 drink per day for nonpregnant women. One drink equals 12 ounces of beer, 5 ounces of wine, or 1 ounces of hard liquor.  Do not use street drugs.  Do not share needles.  Ask your health care provider for help if you need support or information about quitting drugs.  Tell your health care provider if you often feel depressed.  Tell your health care provider if you have ever been abused or do not feel safe at home. This information is not intended to replace advice given to you by your health care provider. Make sure you discuss any questions you have with your health care provider. Document Released: 09/07/2010 Document Revised: 07/31/2015 Document Reviewed: 11/26/2014  2017 Elsevier   Mammogram A mammogram is an X-ray of the breasts that is done to check for abnormal changes. This procedure can screen for and detect any changes that may suggest breast cancer. A mammogram can also identify other changes and variations in the breast, such as:  Inflammation of the breast tissue (mastitis).  An infected area that contains a collection of pus (abscess).  A fluid-filled sac (cyst).  Fibrocystic changes. This is when breast tissue becomes denser, which can make the tissue feel rope-like or uneven under the skin.  Tumors that are not cancerous (benign). Tell a health care provider about:  Any allergies you have.  If you have breast implants.  If you have had previous breast disease, biopsy, or surgery.  If you are breastfeeding.  Any possibility that you  could be pregnant, if this applies.  If you are younger than age 46.  If you have a family history of breast cancer. What are the risks? Generally, this is a safe procedure. However, problems may occur, including:  Exposure to radiation. Radiation levels are very low with this test.  The results being misinterpreted.  The need for further tests.  The inability of the mammogram to detect certain cancers. What happens before the procedure?  Schedule your test about 1-2 weeks after your menstrual period. This is usually when your breasts are the least tender.  If you have had a mammogram done at a different facility in the past, get the mammogram X-rays or have them sent to your current exam facility in order to compare them.  Wash your breasts and under your arms the day of the test.  Do not wear deodorants, perfumes, lotions, or powders anywhere on your body on the day of the test.  Remove any jewelry from your neck.  Wear clothes that you can change into and out of easily. What happens during the procedure?  You will undress from the waist up and put on a gown.  You will stand in front of the X-ray machine.  Each breast will be placed between two plastic or glass plates. The plates will compress your breast for a few seconds. Try to stay as relaxed as possible during the procedure. This does not cause any harm to your breasts and any discomfort you feel will be very brief.  X-rays will be taken from different angles of each breast. The procedure may vary among health care providers and hospitals. What happens after the procedure?  The mammogram will be examined by a specialist (radiologist).  You may need to repeat certain parts of the test, depending on the quality of the images. This is commonly done if the radiologist needs a better view of the breast tissue.  Ask when your test results will be ready. Make sure you get your test results.  You may resume your normal  activities. This information is not intended to replace advice given to you by your health care provider. Make sure you discuss any questions you have with your health care provider. Document Released: 02/20/2000 Document Revised: 07/28/2015 Document Reviewed: 05/03/2014 Elsevier Interactive Patient Education  2017 [employer].

## 2016-02-24 NOTE — Progress Notes (Signed)
Subjective:   [name] is a 65 y.o. female who presents for an Initial Medicare Annual Wellness Visit.  Review of Systems    Cardiac Risk Factors include: advanced age (>22men, >54 women);diabetes mellitus;hypertension;dyslipidemia;sedentary lifestyle     Objective:    Today's Vitals   02/24/16 0917 02/24/16 0924  BP: 128/88   Pulse: 88   Resp: 18   Temp: 98.3 F (36.8 C)   TempSrc: Oral   SpO2: 98%   Weight: 148 lb 1.9 oz (67.2 kg)   Height: 5' 3.5" (1.613 m)   PainSc:  6    Body mass index is 25.83 kg/m.   Current Medications (verified) Outpatient Encounter Prescriptions as of 02/24/2016  Medication Sig  . acetaminophen (TYLENOL) 500 MG tablet Take 1,000 mg by mouth every 6 (six) hours as needed.  Marland Kitchen amLODipine (NORVASC) 5 MG tablet Take 1 tablet (5 mg total) by mouth daily.  Marland Kitchen aspirin 81 MG tablet Take 81 mg by mouth daily.  Marland Kitchen atorvastatin (LIPITOR) 10 MG tablet Take 1 tablet (10 mg total) by mouth daily.  . B-D ULTRAFINE III SHORT PEN 31G X 8 MM MISC FOR USE WITH INSULIN  . gabapentin (NEURONTIN) 100 MG capsule Take 1 capsule (100 mg total) by mouth at bedtime. (Patient taking differently: Take 100 mg by mouth at bedtime. As needed)  . glipiZIDE (GLUCOTROL) 10 MG tablet TAKE 1 TABLET BY MOUTH TWICE A DAY BEFORE MEALS  . glucose blood (ONE TOUCH ULTRA TEST) test strip TEST 3 TIMES A DAY AS DIRECTED (Patient taking differently: TEST ONCE  A DAY AS DIRECTED)  . ibuprofen (ADVIL,MOTRIN) 800 MG tablet One tablet twice daily for 5 days (Patient taking differently: Take 800 mg by mouth every 8 (eight) hours as needed for mild pain. One tablet twice daily for 5 days)  . Insulin Glargine (LANTUS SOLOSTAR) 100 UNIT/ML Solostar Pen INJECT 20 UNITS INTO THE SKIN EVERY EVENING  . metFORMIN (GLUCOPHAGE) 1000 MG tablet Take 1 tablet (1,000 mg total) by mouth 2 (two) times daily with a meal.  . mupirocin ointment (BACTROBAN) 2 % Apply twice daily to affected area for 1 1 week,  then as needed (Patient not taking: Reported on 02/24/2016)  . [DISCONTINUED] doxycycline (VIBRA-TABS) 100 MG tablet Take 1 tablet (100 mg total) by mouth 2 (two) times daily. (Patient not taking: Reported on 02/24/2016)   No facility-administered encounter medications on file as of 02/24/2016.     Allergies (verified) Onglyza [saxagliptin hydrochloride]; Pravastatin; and Ace inhibitors   History: Past Medical History:  Diagnosis Date  . Diabetes mellitus without complication ([place])   . Headache(784.0)   . Hyperlipidemia   . Hypertension   . Vaginitis    Past Surgical History:  Procedure Laterality Date  . TOTAL ABDOMINAL HYSTERECTOMY W/ BILATERAL SALPINGOOPHORECTOMY  1998   Family History  Problem Relation Age of Onset  . Esophageal cancer Father   . Diabetes Sister   . Hypertension Sister   . Seizures Brother    Social History   Occupational History  . employed     Social History Main Topics  . Smoking status: Never Smoker  . Smokeless tobacco: Never Used  . Alcohol use No  . Drug use: No  . Sexual activity: No    Tobacco Counseling Counseling given: No   Activities of Daily Living In your present state of health, do you have any difficulty performing the following activities: 02/24/2016 01/22/2016  Hearing? N N  Vision? Y N  Difficulty  concentrating or making decisions? Y N  Walking or climbing stairs? N N  Dressing or bathing? N N  Doing errands, shopping? N N  Preparing Food and eating ? N -  Using the Toilet? N -  In the past six months, have you accidently leaked urine? N -  Do you have problems with loss of bowel control? N -  Managing your Medications? N -  Managing your Finances? N -  Housekeeping or managing your Housekeeping? N -  Some recent data might be hidden    Immunizations and Health Maintenance Immunization History  Administered Date(s) Administered  . Influenza Split 04/24/2012  . Influenza,inj,Quad PF,36+ Mos 01/25/2014,  03/14/2015  . Tdap 04/24/2012  . Zoster 09/13/2013   Health Maintenance Due  Topic Date Due  . OPHTHALMOLOGY EXAM  05/10/1959  . MAMMOGRAM  01/11/2016    Patient Care Team: Fayrene Helper, MD as PCP - General Williams Che, MD (Ophthalmology)  Indicate any recent Medical Services you may have received from other than [facility] providers in the past year (date may be approximate).     Assessment:   This is a routine wellness examination for [name].   Hearing/Vision screen  Visual Acuity Screening   Right eye Left eye Both eyes  Without correction:     With correction: 20/25 20/25 20/25     Dietary issues and exercise activities discussed: Current Exercise Habits: The patient does not participate in regular exercise at present  Goals    . Eat more fruits and vegetables          Starting 02/24/2016 patient would like to eat 4-5 fruits and vegetables a day.    . Exercise 3x per week (30 min per time)          Starting 02/24/2016 patient would like to start exercising for 30 minutes 2-3 times a week.      Depression Screen PHQ 2/9 Scores 02/24/2016 01/22/2016 06/06/2014 01/23/2013  PHQ - 2 Score 0 0 0 3  PHQ- 9 Score - - - 7    Fall Risk Fall Risk  02/24/2016 01/22/2016 03/14/2015 06/06/2014  Falls in the past year? No No No No  Risk for fall due to : Impaired vision - - -    Cognitive Function:  Normal    6CIT Screen 02/24/2016  What Year? 0 points  What month? 0 points  What time? 0 points  Count back from 20 0 points  Months in reverse 0 points  Repeat phrase 0 points  Total Score 0    Screening Tests Health Maintenance  Topic Date Due  . OPHTHALMOLOGY EXAM  05/10/1959  . MAMMOGRAM  01/11/2016  . PNA vac Low Risk Adult (1 of 2 - PCV13) 04/07/2016 (Originally 05/10/2014)  . INFLUENZA VACCINE  04/08/2016 (Originally 10/07/2015)  . URINE MICROALBUMIN  03/13/2016  . FOOT EXAM  03/14/2016  . HEMOGLOBIN A1C  07/21/2016  . COLONOSCOPY  09/18/2017  .  TETANUS/TDAP  04/24/2022  . DEXA SCAN  Completed  . ZOSTAVAX  Completed  . Hepatitis C Screening  Completed      Plan:    I have personally reviewed and addressed the Medicare Annual Wellness questionnaire and have noted the following in the patient's chart:  A. Medical and social history B. Use of alcohol, tobacco or illicit drugs  C. Current medications and supplements D. Functional ability and status E.  Nutritional status F.  Physical activity G. Advance directives H. List of other physicians I.  Hospitalizations, surgeries, and ER visits in previous 12 months J.  [place] to include hearing, vision, cognitive, depression L. Referrals and appointments - none  In addition, I have reviewed and discussed with patient certain preventive protocols, quality metrics, and best practice recommendations. A written personalized care plan for preventive services as well as general preventive health recommendations were provided to patient.  Signed,   Stormy Fabian, LPN Lead Nurse Health Advisor

## 2016-03-15 ENCOUNTER — Ambulatory Visit (INDEPENDENT_AMBULATORY_CARE_PROVIDER_SITE_OTHER): Payer: Medicare Other | Admitting: Cardiology

## 2016-03-15 ENCOUNTER — Encounter: Payer: Self-pay | Admitting: Cardiology

## 2016-03-15 VITALS — BP 144/80 | HR 84 | Ht 64.0 in | Wt 142.0 lb

## 2016-03-15 DIAGNOSIS — R011 Cardiac murmur, unspecified: Secondary | ICD-10-CM

## 2016-03-15 DIAGNOSIS — I10 Essential (primary) hypertension: Secondary | ICD-10-CM | POA: Diagnosis not present

## 2016-03-15 DIAGNOSIS — E782 Mixed hyperlipidemia: Secondary | ICD-10-CM | POA: Diagnosis not present

## 2016-03-15 DIAGNOSIS — R002 Palpitations: Secondary | ICD-10-CM

## 2016-03-15 NOTE — Patient Instructions (Signed)
Medication Instructions:  Your physician recommends that you continue on your current medications as directed. Please refer to the Current Medication list given to you today.   Labwork: NONE  Testing/Procedures: Your physician has requested that you have an echocardiogram. Echocardiography is a painless test that uses sound waves to create images of your heart. It provides your doctor with information about the size and shape of your heart and how well your heart's chambers and valves are working. This procedure takes approximately one hour. There are no restrictions for this procedure.  Your physician has recommended that you wear a holter monitor. Holter monitors are medical devices that record the heart's electrical activity. Doctors most often use these monitors to diagnose arrhythmias. Arrhythmias are problems with the speed or rhythm of the heartbeat. The monitor is a small, portable device. You can wear one while you do your normal daily activities. This is usually used to diagnose what is causing palpitations/syncope (passing out).    Follow-Up: Your physician recommends that you schedule a follow-up appointment in: TO BE DETERMINDED   Any Other Special Instructions Will Be Listed Below (If Applicable).  WE WILL CALL WITH RESULTS TO TESTS  If you need a refill on your cardiac medications before your next appointment, please call your pharmacy.

## 2016-03-15 NOTE — Progress Notes (Signed)
Cardiology Office Note  Date: 03/15/2016   ID: [name], DOB Nov 01, 1950, MRN [id number]  PCP: [name], MD  Consulting Cardiologist: Rozann Lesches, MD   Chief Complaint  Patient presents with  . Palpitations    History of Present Illness: [name] is a 66 y.o. female referred for cardiology consultation by Dr. Moshe Cipro. I reviewed the recent notes. Patient was referred with atypical chest pain, however in speaking with her today she states that she has not felt any actual discomfort, really feels more of a sense of heart skipping and palpitations. She states that she was diagnosed with an "arrhythmia" back in the 1980s, details are not clear. She was reportedly on a medication for a temporary period of time at that point. She does not report any sudden dizziness or syncope. With typical ADLs reports no exertional chest pain or unusual shortness of breath.  I reviewed her ECG today which shows normal sinus rhythm.  We also discussed her current medications which are outlined below. She reports no new additions. Recent lab work per Dr. Moshe Cipro is outlined below.  Past Medical History:  Diagnosis Date  . Essential hypertension   . Headache(784.0)   . Hyperlipidemia   . Type 2 diabetes mellitus ([place])   . Vaginitis     Past Surgical History:  Procedure Laterality Date  . TOTAL ABDOMINAL HYSTERECTOMY W/ BILATERAL SALPINGOOPHORECTOMY  1998    Current Outpatient Prescriptions  Medication Sig Dispense Refill  . acetaminophen (TYLENOL) 500 MG tablet Take 1,000 mg by mouth every 6 (six) hours as needed.    Marland Kitchen amLODipine (NORVASC) 5 MG tablet Take 1 tablet (5 mg total) by mouth daily. 90 tablet 1  . aspirin 81 MG tablet Take 81 mg by mouth daily.    Marland Kitchen atorvastatin (LIPITOR) 10 MG tablet Take 1 tablet (10 mg total) by mouth daily. 30 tablet 5  . B-D ULTRAFINE III SHORT PEN 31G X 8 MM MISC FOR USE WITH INSULIN 100 each 0  . gabapentin (NEURONTIN) 100 MG capsule  Take 1 capsule (100 mg total) by mouth at bedtime. (Patient taking differently: Take 100 mg by mouth at bedtime. As needed) 30 capsule 3  . glipiZIDE (GLUCOTROL) 10 MG tablet TAKE 1 TABLET BY MOUTH TWICE A DAY BEFORE MEALS 180 tablet 0  . glucose blood (ONE TOUCH ULTRA TEST) test strip TEST 3 TIMES A DAY AS DIRECTED (Patient taking differently: TEST ONCE  A DAY AS DIRECTED) 100 each 0  . ibuprofen (ADVIL,MOTRIN) 800 MG tablet One tablet twice daily for 5 days (Patient taking differently: Take 800 mg by mouth every 8 (eight) hours as needed for mild pain. One tablet twice daily for 5 days) 10 tablet 0  . Insulin Glargine (LANTUS SOLOSTAR) 100 UNIT/ML Solostar Pen INJECT 20 UNITS INTO THE SKIN EVERY EVENING 45 mL 5  . metFORMIN (GLUCOPHAGE) 1000 MG tablet Take 1 tablet (1,000 mg total) by mouth 2 (two) times daily with a meal. 180 tablet 0  . mupirocin ointment (BACTROBAN) 2 % Apply twice daily to affected area for 1 1 week, then as needed 30 g 0   No current facility-administered medications for this visit.    Allergies:  Onglyza [saxagliptin hydrochloride]; Pravastatin; and Ace inhibitors   Social History: The patient  reports that she has never smoked. She has never used smokeless tobacco. She reports that she does not drink alcohol or use drugs.   Family History: The patient's family history includes Diabetes in her sister;  Esophageal cancer in her father; Hypertension in her sister; Seizures in her brother.   ROS:  Please see the history of present illness. Otherwise, complete review of systems is positive for none.  All other systems are reviewed and negative.   Physical Exam: VS:  BP (!) 144/80 (BP Location: Left Arm)   Pulse 84   Ht 5\' 4"  (1.626 m)   Wt 142 lb (64.4 kg)   SpO2 95%   BMI 24.37 kg/m , BMI Body mass index is 24.37 kg/m.  Wt Readings from Last 3 Encounters:  03/15/16 142 lb (64.4 kg)  02/24/16 148 lb 1.9 oz (67.2 kg)  01/22/16 149 lb 1.9 oz (67.6 kg)    General:  Patient appears comfortable at rest. HEENT: Conjunctiva and lids normal, oropharynx clear. Neck: Supple, no elevated JVP or carotid bruits, no thyromegaly. Lungs: Clear to auscultation, nonlabored breathing at rest. Cardiac: Regular rate and rhythm, no S3, 2/6 basal systolic murmur, no pericardial rub. Abdomen: Soft, nontender, bowel sounds present, no guarding or rebound. Extremities: No pitting edema, distal pulses 2+. Skin: Warm and dry. Musculoskeletal: No kyphosis. Neuropsychiatric: Alert and oriented x3, affect grossly appropriate.  ECG: There is no old tracing for comparison.  Recent Labwork: 02/23/2016: ALT 12; AST 13; BUN 15; Creat 0.53; Hemoglobin 12.4; Platelets 399; Potassium 4.0; Sodium 137     Component Value Date/Time   CHOL 236 (H) 01/20/2016 1335   TRIG 66 01/20/2016 1335   HDL 87 01/20/2016 1335   CHOLHDL 2.7 01/20/2016 1335   VLDL 13 01/20/2016 1335   LDLCALC 136 (H) 01/20/2016 1335   Assessment and Plan:  1. Palpitations, no associated dizziness or syncope. She reports a history of "arrhythmia" diagnosed back in the 1980s, details not clear. Baseline ECG is normal. We will obtain a 48-hour Holter monitor to further evaluate.  2. Cardiac murmur. In light of this and palpitations, an echocardiogram will be obtained for cardiac structural assessment.  3. Essential hypertension, on Norvasc.  4. Hyperlipidemia, on Lipitor. Most recent LDL 136 with HDL 87.  Current medicines were reviewed with the patient today.   Orders Placed This Encounter  Procedures  . Holter monitor - 48 hour  . EKG 12-Lead  . ECHOCARDIOGRAM COMPLETE    Disposition: Call with test results.  Signed, Satira Sark, MD, [facility] 03/15/2016 8:52 AM    [facility] [address] [place], [place] [address] Phone: [phone]; Fax: [phone]

## 2016-03-17 ENCOUNTER — Other Ambulatory Visit: Payer: Self-pay | Admitting: Family Medicine

## 2016-03-23 ENCOUNTER — Ambulatory Visit (HOSPITAL_COMMUNITY): Admission: RE | Admit: 2016-03-23 | Payer: Medicare Other | Source: Ambulatory Visit

## 2016-03-31 ENCOUNTER — Other Ambulatory Visit (HOSPITAL_COMMUNITY)
Admission: AD | Admit: 2016-03-31 | Discharge: 2016-03-31 | Disposition: A | Discharge status: Home / Self Care | Payer: Medicare Other | Source: Other Acute Inpatient Hospital | Attending: Urology | Admitting: Urology

## 2016-03-31 ENCOUNTER — Ambulatory Visit (INDEPENDENT_AMBULATORY_CARE_PROVIDER_SITE_OTHER): Payer: Medicare Other | Admitting: Urology

## 2016-03-31 DIAGNOSIS — R310 Gross hematuria: Secondary | ICD-10-CM | POA: Insufficient documentation

## 2016-03-31 LAB — URINALYSIS, COMPLETE (UACMP) WITH MICROSCOPIC
Bilirubin Urine: NEGATIVE
Glucose, UA: NEGATIVE mg/dL
Ketones, ur: NEGATIVE mg/dL
Nitrite: NEGATIVE
Protein, ur: 100 mg/dL — AB
Specific Gravity, Urine: >1.03 — ABNORMAL HIGH (ref 1.005–1.030)
pH: 6 (ref 5.0–8.0)

## 2016-04-05 ENCOUNTER — Ambulatory Visit (HOSPITAL_COMMUNITY)
Admission: RE | Admit: 2016-04-05 | Discharge: 2016-04-05 | Disposition: A | Discharge status: Home / Self Care | Payer: Medicare Other | Source: Ambulatory Visit | Attending: Cardiology | Admitting: Cardiology

## 2016-04-05 DIAGNOSIS — R000 Tachycardia, unspecified: Secondary | ICD-10-CM | POA: Insufficient documentation

## 2016-04-05 DIAGNOSIS — I493 Ventricular premature depolarization: Secondary | ICD-10-CM | POA: Insufficient documentation

## 2016-04-05 DIAGNOSIS — E119 Type 2 diabetes mellitus without complications: Secondary | ICD-10-CM | POA: Diagnosis not present

## 2016-04-05 DIAGNOSIS — R011 Cardiac murmur, unspecified: Secondary | ICD-10-CM | POA: Diagnosis not present

## 2016-04-05 DIAGNOSIS — R002 Palpitations: Secondary | ICD-10-CM

## 2016-04-05 DIAGNOSIS — E785 Hyperlipidemia, unspecified: Secondary | ICD-10-CM | POA: Insufficient documentation

## 2016-04-05 DIAGNOSIS — I10 Essential (primary) hypertension: Secondary | ICD-10-CM | POA: Diagnosis not present

## 2016-04-05 LAB — ECHOCARDIOGRAM COMPLETE
E decel time: 109 msec
FS: 36 % (ref 28–44)
IVS/LV PW RATIO, ED: 1.01
LA ID, A-P, ES: 36 mm
LA diam end sys: 36 mm
LA diam index: 2.1 cm/m2
LA vol A4C: 51 ml
LA vol index: 24.9 mL/m2
LA vol: 42.7 mL
LV PW d: 8.99 mm — AB (ref 0.6–1.1)
LV dias vol index: 29 mL/m2
LV dias vol: 50 mL (ref 46–106)
LV sys vol index: 10 mL/m2
LV sys vol: 16 mL (ref 14–42)
LVOT SV: 57 mL
LVOT VTI: 25.3 cm
LVOT area: 2.27 cm2
LVOT diameter: 17 mm
LVOT peak grad rest: 8 mmHg
LVOT peak vel: 139 cm/s
MV Dec: 109
MV Peak grad: 2 mmHg
MV pk A vel: 114 m/s
MV pk E vel: 71.3 m/s
Simpson's disk: 67
Stroke v: 33 ml
TAPSE: 36.5 mm

## 2016-04-05 NOTE — Progress Notes (Signed)
*  PRELIMINARY RESULTS* Echocardiogram 2D Echocardiogram has been performed.  [name] 04/05/2016, 1:22 PM

## 2016-04-09 ENCOUNTER — Other Ambulatory Visit: Payer: Self-pay | Admitting: Urology

## 2016-04-09 DIAGNOSIS — R310 Gross hematuria: Secondary | ICD-10-CM

## 2016-04-19 ENCOUNTER — Other Ambulatory Visit: Payer: Self-pay | Admitting: Urology

## 2016-04-19 ENCOUNTER — Ambulatory Visit (HOSPITAL_COMMUNITY)
Admission: RE | Admit: 2016-04-19 | Discharge: 2016-04-19 | Disposition: A | Discharge status: Home / Self Care | Payer: Medicare Other | Source: Ambulatory Visit | Attending: Urology | Admitting: Urology

## 2016-04-19 ENCOUNTER — Encounter (HOSPITAL_COMMUNITY): Payer: Self-pay

## 2016-04-19 DIAGNOSIS — R310 Gross hematuria: Secondary | ICD-10-CM

## 2016-04-20 ENCOUNTER — Ambulatory Visit (HOSPITAL_COMMUNITY)
Admission: RE | Admit: 2016-04-20 | Discharge: 2016-04-20 | Disposition: A | Discharge status: Home / Self Care | Payer: Medicare Other | Source: Ambulatory Visit | Attending: Urology | Admitting: Urology

## 2016-04-20 DIAGNOSIS — R310 Gross hematuria: Secondary | ICD-10-CM | POA: Diagnosis not present

## 2016-04-20 DIAGNOSIS — N281 Cyst of kidney, acquired: Secondary | ICD-10-CM | POA: Diagnosis not present

## 2016-04-20 DIAGNOSIS — N132 Hydronephrosis with renal and ureteral calculous obstruction: Secondary | ICD-10-CM | POA: Insufficient documentation

## 2016-04-20 LAB — POCT I-STAT CREATININE: Creatinine, Ser: 0.6 mg/dL (ref 0.44–1.00)

## 2016-04-20 MED ORDER — IOPAMIDOL (ISOVUE-300) INJECTION 61%
125.0000 mL | Freq: Once | INTRAVENOUS | Status: AC | PRN
  Administered 2016-04-20: 125 mL via INTRAVENOUS

## 2016-04-27 ENCOUNTER — Other Ambulatory Visit (HOSPITAL_COMMUNITY): Payer: Medicare Other

## 2016-04-28 ENCOUNTER — Other Ambulatory Visit: Payer: Self-pay | Admitting: Family Medicine

## 2016-04-28 DIAGNOSIS — R310 Gross hematuria: Secondary | ICD-10-CM | POA: Diagnosis not present

## 2016-04-28 DIAGNOSIS — N202 Calculus of kidney with calculus of ureter: Secondary | ICD-10-CM | POA: Diagnosis not present

## 2016-04-29 ENCOUNTER — Encounter: Payer: Self-pay | Admitting: Family Medicine

## 2016-04-29 ENCOUNTER — Ambulatory Visit (INDEPENDENT_AMBULATORY_CARE_PROVIDER_SITE_OTHER): Payer: Medicare Other | Admitting: Family Medicine

## 2016-04-29 VITALS — BP 130/70 | HR 92 | Temp 96.7°F | Resp 18 | Ht 64.0 in | Wt 149.1 lb

## 2016-04-29 DIAGNOSIS — H6121 Impacted cerumen, right ear: Secondary | ICD-10-CM | POA: Diagnosis not present

## 2016-04-29 DIAGNOSIS — H938X1 Other specified disorders of right ear: Secondary | ICD-10-CM | POA: Diagnosis not present

## 2016-04-29 NOTE — Progress Notes (Signed)
Chief Complaint  Patient presents with  . Ear Fullness    2 days ago   Ear pressure and fullness for 2-3days R>L Decreased hearing No cough or cold symptoms No sinus pressure or pain No recent illness Didn't get water in ears Rarely uses q tips because they cause pain   Patient Active Problem List   Diagnosis Date Noted  . Hematuria 02/24/2016  . Microscopic hematuria 02/24/2016  . Vaccine refused by patient 12/11/2014  . Shoulder pain, right 01/25/2014  . Goiter 07/31/2013  . Personal history of noncompliance with medical treatment, presenting hazards to health 01/28/2013  . Neck pain on right side 01/23/2013  . Piles (hemorrhoids) 06/22/2011  . BACK PAIN, THORACIC REGION, RIGHT 01/27/2010  . Diabetes mellitus, insulin dependent (IDDM), controlled ([place]) 06/14/2008  . SKIN TAG 06/13/2008  . Hyperlipidemia LDL goal <100 06/30/2007  . Essential hypertension 06/30/2007    Outpatient Encounter Prescriptions as of 04/29/2016  Medication Sig  . acetaminophen (TYLENOL) 500 MG tablet Take 1,000 mg by mouth every 6 (six) hours as needed.  Marland Kitchen amLODipine (NORVASC) 5 MG tablet Take 1 tablet (5 mg total) by mouth daily.  Marland Kitchen aspirin 81 MG tablet Take 81 mg by mouth daily.  Marland Kitchen atorvastatin (LIPITOR) 10 MG tablet Take 1 tablet (10 mg total) by mouth daily.  . B-D ULTRAFINE III SHORT PEN 31G X 8 MM MISC FOR USE WITH INSULIN  . gabapentin (NEURONTIN) 100 MG capsule Take 1 capsule (100 mg total) by mouth at bedtime. (Patient taking differently: Take 100 mg by mouth at bedtime. As needed)  . glipiZIDE (GLUCOTROL) 10 MG tablet TAKE 1 TABLET BY MOUTH TWICE A DAY BEFORE MEALS  . ibuprofen (ADVIL,MOTRIN) 800 MG tablet One tablet twice daily for 5 days (Patient taking differently: Take 800 mg by mouth every 8 (eight) hours as needed for mild pain. One tablet twice daily for 5 days)  . Insulin Glargine (LANTUS SOLOSTAR) 100 UNIT/ML Solostar Pen INJECT 20 UNITS INTO THE SKIN EVERY EVENING  .  metFORMIN (GLUCOPHAGE) 1000 MG tablet Take 1 tablet (1,000 mg total) by mouth 2 (two) times daily with a meal.  . mupirocin ointment (BACTROBAN) 2 % Apply twice daily to affected area for 1 1 week, then as needed  . ONE TOUCH ULTRA TEST test strip USE TO TEST BLOOD SUGAR 3 TIMES A DAY   No facility-administered encounter medications on file as of 04/29/2016.     Allergies  Allergen Reactions  . Onglyza [Saxagliptin Hydrochloride] Swelling    Tongue swell  . Pravastatin Other (See Comments)    Muscle aches  . Ace Inhibitors Cough    Review of Systems  Constitutional: Negative for chills and fever.  HENT: Positive for ear pain and hearing loss. Negative for congestion, ear discharge, postnasal drip, rhinorrhea, sinus pain, sinus pressure and sore throat.   Eyes: Negative for redness and visual disturbance.  Respiratory: Negative for cough and shortness of breath.   All other systems reviewed and are negative.   BP 130/70 (BP Location: Right Arm, Patient Position: Sitting, Cuff Size: Normal)   Pulse 92   Temp (!) 96.7 F (35.9 C) (Temporal)   Resp 18   Ht _0  (1.626 m)   Wt 149 lb 1.3 oz (67.6 kg)   SpO2 98%   BMI 25.59 kg/m   Physical Exam  Constitutional: She is oriented to person, place, and time. She appears well-developed and well-nourished. No distress.  HENT:  Head: Normocephalic and atraumatic.  Right Ear: External ear normal.  Left Ear: External ear normal.  Mouth/Throat: Oropharynx is clear and moist.  Right canal occluded with cerumen.  Curetted and lavaged clear.  TM is intact.  L eft canal with scant wax, the TM is grey and clear.  Patient has tissues stuffed in each nostril.  Eyes: Conjunctivae are normal. Pupils are equal, round, and reactive to light.  Neck: Normal range of motion.  Cardiovascular: Normal rate, regular rhythm and normal heart sounds.   Pulmonary/Chest: Effort normal and breath sounds normal.  Lymphadenopathy:    She has no cervical  adenopathy.  Neurological: She is alert and oriented to person, place, and time.  Psychiatric: She has a normal mood and affect. Her behavior is normal.    ASSESSMENT/PLAN:  1. Impacted cerumen of right ear  - Ear Lavage  2. Fullness in ear, right  - Ear Lavage Discussed ear care  Patient Instructions  Avoid using Q tips  This pushes wax into the ear canal  You can get an ear wax removal kit/drop at the pharmacy  Return as needed   Raylene Everts, MD

## 2016-04-29 NOTE — Patient Instructions (Signed)
Avoid using Q tips  This pushes wax into the ear canal  You can get an ear wax removal kit/drop at the pharmacy  Return as needed

## 2016-05-03 ENCOUNTER — Other Ambulatory Visit: Payer: Self-pay | Admitting: Family Medicine

## 2016-05-05 ENCOUNTER — Other Ambulatory Visit: Payer: Self-pay | Admitting: Urology

## 2016-05-05 ENCOUNTER — Ambulatory Visit (INDEPENDENT_AMBULATORY_CARE_PROVIDER_SITE_OTHER): Payer: Medicare Other | Admitting: Urology

## 2016-05-05 DIAGNOSIS — N201 Calculus of ureter: Secondary | ICD-10-CM | POA: Diagnosis not present

## 2016-05-05 DIAGNOSIS — N200 Calculus of kidney: Secondary | ICD-10-CM | POA: Diagnosis not present

## 2016-05-21 ENCOUNTER — Other Ambulatory Visit: Payer: Self-pay | Admitting: Family Medicine

## 2016-05-21 DIAGNOSIS — E785 Hyperlipidemia, unspecified: Secondary | ICD-10-CM | POA: Diagnosis not present

## 2016-05-21 DIAGNOSIS — Z794 Long term (current) use of insulin: Secondary | ICD-10-CM | POA: Diagnosis not present

## 2016-05-21 DIAGNOSIS — E119 Type 2 diabetes mellitus without complications: Secondary | ICD-10-CM | POA: Diagnosis not present

## 2016-05-21 LAB — COMPLETE METABOLIC PANEL WITH GFR
ALT: 11 U/L (ref 6–29)
AST: 11 U/L (ref 10–35)
Albumin: 4.4 g/dL (ref 3.6–5.1)
Alkaline Phosphatase: 109 U/L (ref 33–130)
BUN: 13 mg/dL (ref 7–25)
CO2: 28 mmol/L (ref 20–31)
Calcium: 9.4 mg/dL (ref 8.6–10.4)
Chloride: 104 mmol/L (ref 98–110)
Creat: 0.47 mg/dL — ABNORMAL LOW (ref 0.50–0.99)
GFR, Est African American: >89 mL/min (ref >=60)
GFR, Est Non African American: >89 mL/min (ref >=60)
Glucose, Bld: 109 mg/dL — ABNORMAL HIGH (ref 65–99)
Potassium: 4.8 mmol/L (ref 3.5–5.3)
Sodium: 142 mmol/L (ref 135–146)
Total Bilirubin: 0.5 mg/dL (ref 0.2–1.2)
Total Protein: 7.2 g/dL (ref 6.1–8.1)

## 2016-05-21 LAB — LIPID PANEL
Cholesterol: 201 mg/dL — ABNORMAL HIGH (ref <200)
HDL: 84 mg/dL (ref >50)
LDL Cholesterol: 105 mg/dL — ABNORMAL HIGH (ref <100)
Total CHOL/HDL Ratio: 2.4 Ratio (ref <5.0)
Triglycerides: 62 mg/dL (ref <150)
VLDL: 12 mg/dL (ref <30)

## 2016-05-22 LAB — HEMOGLOBIN A1C
Hgb A1c MFr Bld: 7.2 % — ABNORMAL HIGH (ref <5.7)
Mean Plasma Glucose: 160 mg/dL

## 2016-05-24 ENCOUNTER — Encounter: Payer: Self-pay | Admitting: Family Medicine

## 2016-05-24 ENCOUNTER — Ambulatory Visit (INDEPENDENT_AMBULATORY_CARE_PROVIDER_SITE_OTHER): Payer: Medicare Other | Admitting: Family Medicine

## 2016-05-24 VITALS — BP 130/84 | HR 99 | Resp 16 | Ht 64.0 in | Wt 148.0 lb

## 2016-05-24 DIAGNOSIS — Z1211 Encounter for screening for malignant neoplasm of colon: Secondary | ICD-10-CM | POA: Diagnosis not present

## 2016-05-24 DIAGNOSIS — IMO0001 Reserved for inherently not codable concepts without codable children: Secondary | ICD-10-CM

## 2016-05-24 DIAGNOSIS — R1013 Epigastric pain: Secondary | ICD-10-CM

## 2016-05-24 DIAGNOSIS — Z0000 Encounter for general adult medical examination without abnormal findings: Secondary | ICD-10-CM

## 2016-05-24 DIAGNOSIS — E119 Type 2 diabetes mellitus without complications: Secondary | ICD-10-CM

## 2016-05-24 DIAGNOSIS — Z23 Encounter for immunization: Secondary | ICD-10-CM | POA: Diagnosis not present

## 2016-05-24 DIAGNOSIS — Z1239 Encounter for other screening for malignant neoplasm of breast: Secondary | ICD-10-CM

## 2016-05-24 DIAGNOSIS — H6122 Impacted cerumen, left ear: Secondary | ICD-10-CM

## 2016-05-24 DIAGNOSIS — R935 Abnormal findings on diagnostic imaging of other abdominal regions, including retroperitoneum: Secondary | ICD-10-CM

## 2016-05-24 DIAGNOSIS — Z794 Long term (current) use of insulin: Secondary | ICD-10-CM

## 2016-05-24 LAB — POC HEMOCCULT BLD/STL (OFFICE/1-CARD/DIAGNOSTIC): Fecal Occult Blood, POC: NEGATIVE

## 2016-05-24 MED ORDER — INSULIN PEN NEEDLE 31G X 8 MM MISC: 11 refills | Status: DC

## 2016-05-24 NOTE — Patient Instructions (Addendum)
f/u in 4 month, call if you need me sooner  You are referred to [facility] Doc re abdominal pain and abnormal scan  PLEASE schedule your mammogram at [facility], pAST due  Microalb today  All th best with surgery next week for rigth kidney stone  CONGRATS on improved blood sugar and cholesterol  Congrats on flu and Prevnar vaccines today  You will be referred to ENT for left ear wax  Non fast hBA1c and chem 7 and EGFR in 4 month  Thank you  for choosing [facility]. We consider it a privelige to serve you.  Delivering excellent health care in a caring and  compassionate way is our goal.  Partnering with you,  so that together we can achieve this goal is our strategy.    

## 2016-05-24 NOTE — Progress Notes (Signed)
03/25/2016- noted in EPIC- EKG 04/05/2016- Noted in EPIC-ECHO  and 48 Hr. Holter monitor 04/20/2016- noted in EPIC- CT ABD./pelvis w/wo contrast (no mention of lungs) 05/21/2016- noted in EPIC- CMP, HGA1C, Lipid panel

## 2016-05-24 NOTE — Patient Instructions (Addendum)
Analynn Daum  05/24/2016   Your procedure is scheduled CL:EXNTZG 05/31/2016   Report to [facility]  Entrance take [place]  elevators to 3rd floor to  [place] at  [place] AM.  Call this number if you have problems the morning of surgery [phone]   Remember: ONLY 1 PERSON MAY GO WITH YOU TO SHORT STAY TO GET  READY MORNING OF [place].  How to Manage Your Diabetes Before and After Surgery  Why is it important to control my blood sugar before and after surgery? . Improving blood sugar levels before and after surgery helps healing and can limit problems. . A way of improving blood sugar control is eating a healthy diet by: o  Eating less sugar and carbohydrates o  Increasing activity/exercise o  Talking with your doctor about reaching your blood sugar goals . High blood sugars (greater than 180 mg/dL) can raise your risk of infections and slow your recovery, so you will need to focus on controlling your diabetes during the weeks before surgery. . Make sure that the doctor who takes care of your diabetes knows about your planned surgery including the date and location.  How do I manage my blood sugar before surgery? . Check your blood sugar at least 4 times a day, starting 2 days before surgery, to make sure that the level is not too high or low. o Check your blood sugar the morning of your surgery when you wake up and every 2 hours until you get to the Short Stay unit. . If your blood sugar is less than 70 mg/dL, you will need to treat for low blood sugar: o Do not take insulin. o Treat a low blood sugar (less than 70 mg/dL) with  cup of clear juice (cranberry or apple), 4 glucose tablets, OR glucose gel. o Recheck blood sugar in 15 minutes after treatment (to make sure it is greater than 70 mg/dL). If your blood sugar is not greater than 70 mg/dL on recheck, call [phone] for further instructions. . Report your blood sugar to the short  stay nurse when you get to Short Stay.  . If you are admitted to the hospital after surgery: o Your blood sugar will be checked by the staff and you will probably be given insulin after surgery (instead of oral diabetes medicines) to make sure you have good blood sugar levels. o The goal for blood sugar control after surgery is 80-180 mg/dL.   WHAT DO I DO ABOUT MY DIABETES MEDICATION?          The day before surgery, take Metformin (Glucophage)  (your usual dose)           The day before surgery, take Glipizide (Glucotrol) ,take your morning dose and NO EVENING DOSE   . Do not take oral diabetes medicines (pills) the morning of surgery.!  . THE NIGHT BEFORE SURGERY, take  9   units of   Lantus     insulin.       . THE MORNING OF SURGERY, take   0   units of     Lantus     insulin.      Do not eat food or drink liquids :After Midnight.     Take these medicines the morning of surgery with A SIP OF WATER: Amlodipine (Norvasc)  You may not have any metal on your body including hair pins and              piercings  Do not wear jewelry, make-up, lotions, powders or perfumes, deodorant             Do not wear nail polish.  Do not shave  48 hours prior to surgery.              Men may shave face and neck.   Do not bring valuables to the hospital. [place].  Contacts, dentures or bridgework may not be worn into surgery.  Leave suitcase in the car. After surgery it may be brought to your room.                  Please read over the following fact sheets you were given: _____________________________________________________________________             [facility] - Preparing for Surgery Before surgery, you can play an important role.  Because skin is not sterile, your skin needs to be as free of germs as possible.  You can reduce the number of germs on your skin by washing with CHG (chlorahexidine  gluconate) soap before surgery.  CHG is an antiseptic cleaner which kills germs and bonds with the skin to continue killing germs even after washing. Please DO NOT use if you have an allergy to CHG or antibacterial soaps.  If your skin becomes reddened/irritated stop using the CHG and inform your nurse when you arrive at Short Stay. Do not shave (including legs and underarms) for at least 48 hours prior to the first CHG shower.  You may shave your face/neck. Please follow these instructions carefully:  1.  Shower with CHG Soap the night before surgery and the  morning of Surgery.  2.  If you choose to wash your hair, wash your hair first as usual with your  normal  shampoo.  3.  After you shampoo, rinse your hair and body thoroughly to remove the  shampoo.                           4.  Use CHG as you would any other liquid soap.  You can apply chg directly  to the skin and wash                       Gently with a scrungie or clean washcloth.  5.  Apply the CHG Soap to your body ONLY FROM THE NECK DOWN.   Do not use on face/ open                           Wound or open sores. Avoid contact with eyes, ears mouth and genitals (private parts).                       Wash face,  Genitals (private parts) with your normal soap.             6.  Wash thoroughly, paying special attention to the area where your surgery  will be performed.  7.  Thoroughly rinse your body with warm water from the neck down.  8.  DO NOT shower/wash with your normal soap after using  and rinsing off  the CHG Soap.                9.  Pat yourself dry with a clean towel.            10.  Wear clean pajamas.            11.  Place clean sheets on your bed the night of your first shower and do not  sleep with pets. Day of Surgery : Do not apply any lotions/deodorants the morning of surgery.  Please wear clean clothes to the hospital/surgery center.  FAILURE TO FOLLOW THESE INSTRUCTIONS MAY RESULT IN THE CANCELLATION OF YOUR  SURGERY PATIENT SIGNATURE_________________________________  NURSE SIGNATURE__________________________________  ________________________________________________________________________

## 2016-05-25 ENCOUNTER — Encounter (HOSPITAL_COMMUNITY): Payer: Self-pay

## 2016-05-25 ENCOUNTER — Encounter (HOSPITAL_COMMUNITY)
Admission: RE | Admit: 2016-05-25 | Discharge: 2016-05-25 | Disposition: A | Discharge status: Home / Self Care | Payer: Medicare Other | Source: Ambulatory Visit | Attending: Urology | Admitting: Urology

## 2016-05-25 DIAGNOSIS — R109 Unspecified abdominal pain: Secondary | ICD-10-CM | POA: Insufficient documentation

## 2016-05-25 DIAGNOSIS — Z0183 Encounter for blood typing: Secondary | ICD-10-CM | POA: Diagnosis not present

## 2016-05-25 DIAGNOSIS — Z01812 Encounter for preprocedural laboratory examination: Secondary | ICD-10-CM | POA: Diagnosis not present

## 2016-05-25 DIAGNOSIS — N200 Calculus of kidney: Secondary | ICD-10-CM | POA: Diagnosis not present

## 2016-05-25 DIAGNOSIS — Z23 Encounter for immunization: Secondary | ICD-10-CM | POA: Insufficient documentation

## 2016-05-25 LAB — ABO/RH: ABO/RH(D): A POS

## 2016-05-25 LAB — CBC
HCT: 37.8 % (ref 36.0–46.0)
Hemoglobin: 12 g/dL (ref 12.0–15.0)
MCH: 28.1 pg (ref 26.0–34.0)
MCHC: 31.7 g/dL (ref 30.0–36.0)
MCV: 88.5 fL (ref 78.0–100.0)
Platelets: 343 10*3/uL (ref 150–400)
RBC: 4.27 MIL/uL (ref 3.87–5.11)
RDW: 13.6 % (ref 11.5–15.5)
WBC: 4.7 10*3/uL (ref 4.0–10.5)

## 2016-05-25 LAB — BASIC METABOLIC PANEL
Anion gap: 8 (ref 5–15)
BUN: 14 mg/dL (ref 6–20)
CO2: 28 mmol/L (ref 22–32)
Calcium: 9.9 mg/dL (ref 8.9–10.3)
Chloride: 101 mmol/L (ref 101–111)
Creatinine, Ser: 0.51 mg/dL (ref 0.44–1.00)
GFR calc Af Amer: >60 mL/min (ref >60)
GFR calc non Af Amer: >60 mL/min (ref >60)
Glucose, Bld: 233 mg/dL — ABNORMAL HIGH (ref 65–99)
Potassium: 3.6 mmol/L (ref 3.5–5.1)
Sodium: 137 mmol/L (ref 135–145)

## 2016-05-25 LAB — GLUCOSE, CAPILLARY: Glucose-Capillary: 229 mg/dL — ABNORMAL HIGH (ref 65–99)

## 2016-05-25 NOTE — Assessment & Plan Note (Signed)
Controlled, no change in medication [name] is reminded of the importance of commitment to daily physical activity for 30 minutes or more, as able and the need to limit carbohydrate intake to 30 to 60 grams per meal to help with blood sugar control.   The need to take medication as prescribed, test blood sugar as directed, and to call between visits if there is a concern that blood sugar is uncontrolled is also discussed.   [name] is reminded of the importance of daily foot exam, annual eye examination, and good blood sugar, blood pressure and cholesterol control.  Diabetic Labs Latest Ref Rng & Units 05/25/2016 05/21/2016 04/20/2016 02/23/2016 01/20/2016  HbA1c <5.7 % - 7.2(H) - - 7.3(H)  Microalbumin Not estab mg/dL - - - - -  Micro/Creat Ratio <30 mcg/mg creat - - - - -  Chol <200 mg/dL - 201(H) - - 236(H)  HDL >50 mg/dL - 84 - - 87  Calc LDL <100 mg/dL - 105(H) - - 136(H)  Triglycerides <150 mg/dL - 62 - - 66  Creatinine 0.44 - 1.00 mg/dL 0.51 0.47(L) 0.60 0.53 0.55   BP/Weight 05/25/2016 05/24/2016 04/29/2016 03/15/2016 02/24/2016 78/93/8101 09/10/1023  Systolic BP 852 778 242 353 614 431 540  Diastolic BP 77 84 70 80 88 78 88  Wt. (Lbs) 147 148 149.08 142 148.12 149.12 152  BMI 25.23 25.4 25.59 24.37 25.83 24.07 26.08   Foot/eye exam completion dates 05/24/2016 03/14/2015  Foot exam Order - -  Foot Form Completion Done Done

## 2016-05-25 NOTE — Assessment & Plan Note (Signed)

## 2016-05-25 NOTE — Progress Notes (Addendum)
[name]     MRN: [id number]      DOB: [birth year]  HPI: Patient is in for annual physical exam. Has planned surgery next week for large kidney stone, now anxious to get flu and pneumonia vaccines since she will be "in hospital environment" c/o multiple sebaceous cysts and in particular the one  On RUQ bothers her, will wait on intervention at this time c/o left ear discomfort and hearing loss, recently had successful right ear irrigation c/o chronic upper abdominal pain and recent scan shows dilated bile and pancreatic ducts, needs GI eval  Recent labs,  are reviewed. And are good. Review of recent abdominal scan shows dilated bile duct and she will need GI evaluation for this as well as her c/o chronic abdominal pain and bloating Immunization is reviewed , and  updated    PE: BP 130/84   Pulse 99   Resp 16   Ht 5\' 4"  (1.626 m)   Wt 148 lb (67.1 kg)   SpO2 97%   BMI 25.40 kg/m   Pleasant  female, alert and oriented x 3, in no cardio-pulmonary distress. Afebrile. HEENT No facial trauma or asymetry. Sinuses non tender.  Extra occullar muscles intact, pupils equally reactive to light. External ears normal,right  tympanic membranes clear.Left TM occluded by wax Oropharynx moist, no exudate. Neck: supple, no adenopathy,JVD or thyromegaly.No bruits.  Chest: Clear to ascultation bilaterally.No crackles or wheezes. Non tender to palpation  Breast: No asymetry,no masses or lumps. No tenderness. No nipple discharge or inversion. No axillary or supraclavicular adenopathy  Cardiovascular system; Heart sounds normal,  S1 and  S2 ,no S3.  No murmur, or thrill. Apical beat not displaced Peripheral pulses normal.  Abdomen: Soft, non tender, no organomegaly or masses. No bruits. Bowel sounds normal. No guarding, tenderness or rebound.  Rectal:  Normal sphincter tone. No rectal mass. Guaiac negative stool.    Musculoskeletal exam: Full ROM of spine, hips ,  shoulders and knees. No deformity ,swelling or crepitus noted. No muscle wasting or atrophy.   Neurologic: Cranial nerves 2 to 12 intact. Power, tone ,sensation and reflexes normal throughout. No disturbance in gait. No tremor.  Skin: Intact, no ulceration, erythema , scaling or rash noted. Pigmentation normal throughout Multiple sebaceous cysts on back and RUQ  Psych; Normal mood and affect. Judgement and concentration normal   Assessment & Plan:  Annual physical exam Annual exam as documented. Counseling done  re healthy lifestyle involving commitment to 150 minutes exercise per week, heart healthy diet, and attaining healthy weight.The importance of adequate sleep also discussed. Regular seat belt use and home safety, is also discussed. Changes in health habits are decided on by the patient with goals and time frames  set for achieving them. Immunization and cancer screening needs are specifically addressed at this visit.   Need for influenza vaccination After obtaining informed consent, the vaccine is  administered by LPN.   Need for vaccination with 13-polyvalent pneumococcal conjugate vaccine After obtaining informed consent, the vaccine is  administered by LPN.   Abdominal pain c/o upper gastric and upper abdominal pain with abnormal scan showing dilated bile and pancreatic ducts and possible abn in pancreatic head, needs GI eval will refer  Diabetes mellitus, insulin dependent (IDDM), controlled (HCC) Controlled, no change in medication [name] is reminded of the importance of commitment to daily physical activity for 30 minutes or more, as able and the need to limit carbohydrate intake to 30 to 60 grams per  meal to help with blood sugar control.   The need to take medication as prescribed, test blood sugar as directed, and to call between visits if there is a concern that blood sugar is uncontrolled is also discussed.   [name] is reminded of the importance  of daily foot exam, annual eye examination, and good blood sugar, blood pressure and cholesterol control.  Diabetic Labs Latest Ref Rng & Units 05/25/2016 05/21/2016 04/20/2016 02/23/2016 01/20/2016  HbA1c <5.7 % - 7.2(H) - - 7.3(H)  Microalbumin Not estab mg/dL - - - - -  Micro/Creat Ratio <30 mcg/mg creat - - - - -  Chol <200 mg/dL - 201(H) - - 236(H)  HDL >50 mg/dL - 84 - - 87  Calc LDL <100 mg/dL - 105(H) - - 136(H)  Triglycerides <150 mg/dL - 62 - - 66  Creatinine 0.44 - 1.00 mg/dL 0.51 0.47(L) 0.60 0.53 0.55   BP/Weight 05/25/2016 05/24/2016 04/29/2016 03/15/2016 02/24/2016 35/70/1779 05/14/298  Systolic BP 923 300 762 263 335 456 256  Diastolic BP 77 84 70 80 88 78 88  Wt. (Lbs) 147 148 149.08 142 148.12 149.12 152  BMI 25.23 25.4 25.59 24.37 25.83 24.07 26.08   Foot/eye exam completion dates 05/24/2016 03/14/2015  Foot exam Order - -  Foot Form Completion Done Done

## 2016-05-25 NOTE — Assessment & Plan Note (Signed)
After obtaining informed consent, the vaccine is  administered by LPN.  

## 2016-05-25 NOTE — Assessment & Plan Note (Signed)
c/o upper gastric and upper abdominal pain with abnormal scan showing dilated bile and pancreatic ducts and possible abn in pancreatic head, needs GI eval will refer

## 2016-05-27 ENCOUNTER — Encounter (INDEPENDENT_AMBULATORY_CARE_PROVIDER_SITE_OTHER): Payer: Self-pay | Admitting: Internal Medicine

## 2016-05-31 ENCOUNTER — Observation Stay (HOSPITAL_COMMUNITY)
Admission: RE | Admit: 2016-05-31 | Discharge: 2016-06-01 | Disposition: A | Discharge status: Home / Self Care | Payer: Medicare Other | Source: Ambulatory Visit | Attending: Urology | Admitting: Urology

## 2016-05-31 ENCOUNTER — Encounter (HOSPITAL_COMMUNITY): Payer: Self-pay | Admitting: Anesthesiology

## 2016-05-31 ENCOUNTER — Ambulatory Visit (HOSPITAL_COMMUNITY): Payer: Medicare Other

## 2016-05-31 ENCOUNTER — Ambulatory Visit (HOSPITAL_COMMUNITY): Payer: Medicare Other | Admitting: Anesthesiology

## 2016-05-31 ENCOUNTER — Encounter (HOSPITAL_COMMUNITY)
Admission: RE | Disposition: A | Discharge status: Home / Self Care | Payer: Self-pay | Source: Ambulatory Visit | Attending: Urology

## 2016-05-31 DIAGNOSIS — Z87442 Personal history of urinary calculi: Secondary | ICD-10-CM | POA: Diagnosis not present

## 2016-05-31 DIAGNOSIS — R109 Unspecified abdominal pain: Secondary | ICD-10-CM | POA: Diagnosis not present

## 2016-05-31 DIAGNOSIS — Z79899 Other long term (current) drug therapy: Secondary | ICD-10-CM | POA: Diagnosis not present

## 2016-05-31 DIAGNOSIS — I10 Essential (primary) hypertension: Secondary | ICD-10-CM | POA: Insufficient documentation

## 2016-05-31 DIAGNOSIS — E119 Type 2 diabetes mellitus without complications: Secondary | ICD-10-CM | POA: Insufficient documentation

## 2016-05-31 DIAGNOSIS — E785 Hyperlipidemia, unspecified: Secondary | ICD-10-CM | POA: Insufficient documentation

## 2016-05-31 DIAGNOSIS — N200 Calculus of kidney: Principal | ICD-10-CM

## 2016-05-31 DIAGNOSIS — Z794 Long term (current) use of insulin: Secondary | ICD-10-CM | POA: Insufficient documentation

## 2016-05-31 DIAGNOSIS — E049 Nontoxic goiter, unspecified: Secondary | ICD-10-CM | POA: Diagnosis not present

## 2016-05-31 HISTORY — PX: NEPHROLITHOTOMY: SHX5134

## 2016-05-31 HISTORY — DX: Calculus of kidney: N20.0

## 2016-05-31 LAB — CBC
HCT: 37 % (ref 36.0–46.0)
Hemoglobin: 11.9 g/dL — ABNORMAL LOW (ref 12.0–15.0)
MCH: 28.4 pg (ref 26.0–34.0)
MCHC: 32.2 g/dL (ref 30.0–36.0)
MCV: 88.3 fL (ref 78.0–100.0)
Platelets: 287 10*3/uL (ref 150–400)
RBC: 4.19 MIL/uL (ref 3.87–5.11)
RDW: 13.7 % (ref 11.5–15.5)
WBC: 7.1 10*3/uL (ref 4.0–10.5)

## 2016-05-31 LAB — BASIC METABOLIC PANEL
Anion gap: 8 (ref 5–15)
BUN: 13 mg/dL (ref 6–20)
CO2: 27 mmol/L (ref 22–32)
Calcium: 8.7 mg/dL — ABNORMAL LOW (ref 8.9–10.3)
Chloride: 102 mmol/L (ref 101–111)
Creatinine, Ser: 0.63 mg/dL (ref 0.44–1.00)
GFR calc Af Amer: >60 mL/min (ref >60)
GFR calc non Af Amer: >60 mL/min (ref >60)
Glucose, Bld: 181 mg/dL — ABNORMAL HIGH (ref 65–99)
Potassium: 3.7 mmol/L (ref 3.5–5.1)
Sodium: 137 mmol/L (ref 135–145)

## 2016-05-31 LAB — TYPE AND SCREEN
ABO/RH(D): A POS
Antibody Screen: NEGATIVE

## 2016-05-31 LAB — GLUCOSE, CAPILLARY
Glucose-Capillary: 152 mg/dL — ABNORMAL HIGH (ref 65–99)
Glucose-Capillary: 169 mg/dL — ABNORMAL HIGH (ref 65–99)
Glucose-Capillary: 200 mg/dL — ABNORMAL HIGH (ref 65–99)
Glucose-Capillary: 242 mg/dL — ABNORMAL HIGH (ref 65–99)

## 2016-05-31 SURGERY — NEPHROLITHOTOMY PERCUTANEOUS
Anesthesia: General | Laterality: Right

## 2016-05-31 MED ORDER — PHENYLEPHRINE 40 MCG/ML (10ML) SYRINGE FOR IV PUSH (FOR BLOOD PRESSURE SUPPORT)
PREFILLED_SYRINGE | INTRAVENOUS | Status: DC | PRN
  Administered 2016-05-31: 40 ug via INTRAVENOUS

## 2016-05-31 MED ORDER — SODIUM CHLORIDE 0.9 % IR SOLN
Status: DC | PRN
  Administered 2016-05-31: 6000 mL

## 2016-05-31 MED ORDER — MIDAZOLAM HCL 2 MG/2ML IJ SOLN
INTRAMUSCULAR | Status: AC
  Filled 2016-05-31: qty 2

## 2016-05-31 MED ORDER — HYDROMORPHONE HCL 1 MG/ML IJ SOLN
0.2500 mg | INTRAMUSCULAR | Status: DC | PRN
  Administered 2016-05-31 (×2): 0.5 mg via INTRAVENOUS

## 2016-05-31 MED ORDER — ACETAMINOPHEN 325 MG PO TABS
650.0000 mg | ORAL_TABLET | ORAL | Status: DC | PRN
  Administered 2016-05-31: 650 mg via ORAL
  Filled 2016-05-31: qty 2

## 2016-05-31 MED ORDER — LIDOCAINE 2% (20 MG/ML) 5 ML SYRINGE
INTRAMUSCULAR | Status: DC | PRN
  Administered 2016-05-31: 100 mg via INTRAVENOUS

## 2016-05-31 MED ORDER — DEXAMETHASONE SODIUM PHOSPHATE 10 MG/ML IJ SOLN
INTRAMUSCULAR | Status: DC | PRN
  Administered 2016-05-31: 6 mg via INTRAVENOUS

## 2016-05-31 MED ORDER — ROCURONIUM BROMIDE 10 MG/ML (PF) SYRINGE
PREFILLED_SYRINGE | INTRAVENOUS | Status: DC | PRN
  Administered 2016-05-31: 50 mg via INTRAVENOUS
  Administered 2016-05-31 (×2): 10 mg via INTRAVENOUS

## 2016-05-31 MED ORDER — FENTANYL CITRATE (PF) 100 MCG/2ML IJ SOLN
INTRAMUSCULAR | Status: DC | PRN
  Administered 2016-05-31: 50 ug via INTRAVENOUS
  Administered 2016-05-31: 25 ug via INTRAVENOUS
  Administered 2016-05-31: 50 ug via INTRAVENOUS
  Administered 2016-05-31: 75 ug via INTRAVENOUS

## 2016-05-31 MED ORDER — SUGAMMADEX SODIUM 200 MG/2ML IV SOLN
INTRAVENOUS | Status: DC | PRN
  Administered 2016-05-31: 200 mg via INTRAVENOUS

## 2016-05-31 MED ORDER — DIPHENHYDRAMINE HCL 50 MG/ML IJ SOLN: 12.5000 mg | Freq: Four times a day (QID) | INTRAMUSCULAR | Status: DC | PRN

## 2016-05-31 MED ORDER — HYDROMORPHONE HCL 1 MG/ML IJ SOLN
0.5000 mg | INTRAMUSCULAR | Status: DC | PRN
  Administered 2016-05-31 (×2): 0.5 mg via INTRAVENOUS
  Filled 2016-05-31 (×2): qty 0.5

## 2016-05-31 MED ORDER — FENTANYL CITRATE (PF) 100 MCG/2ML IJ SOLN
INTRAMUSCULAR | Status: AC
Start: 2016-05-31 — End: 2016-05-31
  Filled 2016-05-31: qty 2

## 2016-05-31 MED ORDER — PHENYLEPHRINE 40 MCG/ML (10ML) SYRINGE FOR IV PUSH (FOR BLOOD PRESSURE SUPPORT)
PREFILLED_SYRINGE | INTRAVENOUS | Status: AC
  Filled 2016-05-31: qty 10

## 2016-05-31 MED ORDER — ATORVASTATIN CALCIUM 10 MG PO TABS
10.0000 mg | ORAL_TABLET | Freq: Every day | ORAL | Status: DC
  Administered 2016-06-01: 10 mg via ORAL
  Filled 2016-05-31 (×2): qty 1

## 2016-05-31 MED ORDER — SODIUM CHLORIDE 0.9 % IV SOLN
INTRAVENOUS | Status: DC
  Administered 2016-05-31 – 2016-06-01 (×2): via INTRAVENOUS

## 2016-05-31 MED ORDER — ROCURONIUM BROMIDE 50 MG/5ML IV SOSY
PREFILLED_SYRINGE | INTRAVENOUS | Status: AC
  Filled 2016-05-31: qty 10

## 2016-05-31 MED ORDER — PROMETHAZINE HCL 25 MG/ML IJ SOLN
6.2500 mg | INTRAMUSCULAR | Status: DC | PRN
Start: 2016-05-31 — End: 2016-05-31

## 2016-05-31 MED ORDER — LACTATED RINGERS IV SOLN
INTRAVENOUS | Status: DC
  Administered 2016-05-31 (×3): via INTRAVENOUS

## 2016-05-31 MED ORDER — ZOLPIDEM TARTRATE 5 MG PO TABS: 5.0000 mg | ORAL_TABLET | Freq: Every evening | ORAL | Status: DC | PRN

## 2016-05-31 MED ORDER — OXYCODONE HCL 5 MG/5ML PO SOLN
5.0000 mg | Freq: Once | ORAL | Status: DC | PRN
  Filled 2016-05-31: qty 5

## 2016-05-31 MED ORDER — HYDROMORPHONE HCL 1 MG/ML IJ SOLN
INTRAMUSCULAR | Status: AC
  Filled 2016-05-31: qty 1

## 2016-05-31 MED ORDER — ONDANSETRON HCL 4 MG/2ML IJ SOLN
4.0000 mg | INTRAMUSCULAR | Status: DC | PRN
  Filled 2016-05-31: qty 2

## 2016-05-31 MED ORDER — INSULIN ASPART 100 UNIT/ML ~~LOC~~ SOLN
0.0000 [IU] | Freq: Three times a day (TID) | SUBCUTANEOUS | Status: DC
  Administered 2016-05-31 – 2016-06-01 (×2): 3 [IU] via SUBCUTANEOUS
  Administered 2016-06-01: 5 [IU] via SUBCUTANEOUS

## 2016-05-31 MED ORDER — CEFAZOLIN SODIUM-DEXTROSE 2-4 GM/100ML-% IV SOLN
2.0000 g | INTRAVENOUS | Status: AC
  Administered 2016-05-31: 2 g via INTRAVENOUS
  Filled 2016-05-31: qty 100

## 2016-05-31 MED ORDER — DIPHENHYDRAMINE HCL 12.5 MG/5ML PO ELIX: 12.5000 mg | ORAL_SOLUTION | Freq: Four times a day (QID) | ORAL | Status: DC | PRN

## 2016-05-31 MED ORDER — SUGAMMADEX SODIUM 200 MG/2ML IV SOLN
INTRAVENOUS | Status: AC
  Filled 2016-05-31: qty 2

## 2016-05-31 MED ORDER — OXYCODONE-ACETAMINOPHEN 5-325 MG PO TABS: 1.0000 | ORAL_TABLET | ORAL | Status: DC | PRN

## 2016-05-31 MED ORDER — HYOSCYAMINE SULFATE 0.125 MG SL SUBL
0.1250 mg | SUBLINGUAL_TABLET | SUBLINGUAL | Status: DC | PRN
  Filled 2016-05-31: qty 1

## 2016-05-31 MED ORDER — IOHEXOL 300 MG/ML  SOLN
INTRAMUSCULAR | Status: DC | PRN
  Administered 2016-05-31: 60 mL

## 2016-05-31 MED ORDER — MEPERIDINE HCL 50 MG/ML IJ SOLN: 6.2500 mg | INTRAMUSCULAR | Status: DC | PRN

## 2016-05-31 MED ORDER — PROPOFOL 10 MG/ML IV BOLUS
INTRAVENOUS | Status: AC
  Filled 2016-05-31: qty 20

## 2016-05-31 MED ORDER — FENTANYL CITRATE (PF) 100 MCG/2ML IJ SOLN
INTRAMUSCULAR | Status: AC
  Filled 2016-05-31: qty 2

## 2016-05-31 MED ORDER — OXYCODONE HCL 5 MG PO TABS: 5.0000 mg | ORAL_TABLET | Freq: Once | ORAL | Status: DC | PRN

## 2016-05-31 MED ORDER — PROPOFOL 10 MG/ML IV BOLUS
INTRAVENOUS | Status: DC | PRN
  Administered 2016-05-31: 200 mg via INTRAVENOUS

## 2016-05-31 MED ORDER — MIDAZOLAM HCL 5 MG/5ML IJ SOLN
INTRAMUSCULAR | Status: DC | PRN
  Administered 2016-05-31: 2 mg via INTRAVENOUS

## 2016-05-31 MED ORDER — ONDANSETRON HCL 4 MG/2ML IJ SOLN
INTRAMUSCULAR | Status: DC | PRN
  Administered 2016-05-31: 4 mg via INTRAVENOUS

## 2016-05-31 SURGICAL SUPPLY — 68 items
APL SKNCLS STERI-STRIP NONHPOA (GAUZE/BANDAGES/DRESSINGS) ×2
BAG URINE DRAINAGE (UROLOGICAL SUPPLIES) ×4 IMPLANT
BASKET STONE NITINOL 3FRX115MB (UROLOGICAL SUPPLIES) IMPLANT
BASKET ZERO TIP NITINOL 2.4FR (BASKET) IMPLANT
BENZOIN TINCTURE PRP APPL 2/3 (GAUZE/BANDAGES/DRESSINGS) ×4 IMPLANT
BLADE SURG 15 STRL LF DISP TIS (BLADE) ×1 IMPLANT
BLADE SURG 15 STRL SS (BLADE) ×2
BSKT STON RTRVL ZERO TP 2.4FR (BASKET)
CATCHER STONE W/TUBE ADAPTER (UROLOGICAL SUPPLIES) IMPLANT
CATH FOLEY 2W COUNCIL 20FR 5CC (CATHETERS) IMPLANT
CATH FOLEY 2WAY SLVR  5CC 16FR (CATHETERS) ×1
CATH FOLEY 2WAY SLVR  5CC 18FR (CATHETERS)
CATH FOLEY 2WAY SLVR 5CC 16FR (CATHETERS) ×1 IMPLANT
CATH FOLEY 2WAY SLVR 5CC 18FR (CATHETERS) ×1 IMPLANT
CATH IMAGER II 65CM (CATHETERS) ×2 IMPLANT
CATH INTERMIT  6FR 70CM (CATHETERS) ×2 IMPLANT
CATH ROBINSON RED A/P 20FR (CATHETERS) IMPLANT
CATH ULTRATHANE 14FR (CATHETERS) ×1 IMPLANT
CATH URET DUAL LUMEN 6-10FR 50 (CATHETERS) IMPLANT
CATH X-FORCE N30 NEPHROSTOMY (TUBING) ×2 IMPLANT
COVER SURGICAL LIGHT HANDLE (MISCELLANEOUS) ×2 IMPLANT
DRAPE C-ARM 42X120 X-RAY (DRAPES) ×2 IMPLANT
DRAPE LINGEMAN PERC (DRAPES) ×2 IMPLANT
DRAPE SHEET LG 3/4 BI-LAMINATE (DRAPES) ×2 IMPLANT
DRAPE SURG IRRIG POUCH 19X23 (DRAPES) ×2 IMPLANT
DRSG PAD ABDOMINAL 8X10 ST (GAUZE/BANDAGES/DRESSINGS) ×3 IMPLANT
DRSG TEGADERM 8X12 (GAUZE/BANDAGES/DRESSINGS) ×3 IMPLANT
FIBER LASER FLEXIVA 1000 (UROLOGICAL SUPPLIES) IMPLANT
FIBER LASER FLEXIVA 365 (UROLOGICAL SUPPLIES) IMPLANT
FIBER LASER FLEXIVA 550 (UROLOGICAL SUPPLIES) IMPLANT
FIBER LASER TRAC TIP (UROLOGICAL SUPPLIES) IMPLANT
GAUZE SPONGE 4X4 12PLY STRL (GAUZE/BANDAGES/DRESSINGS) ×1 IMPLANT
GLOVE BIO SURGEON STRL SZ8 (GLOVE) ×6 IMPLANT
GLOVE BIOGEL PI IND STRL 8 (GLOVE) IMPLANT
GLOVE BIOGEL PI INDICATOR 8 (GLOVE) ×1
GOWN STRL REUS W/TWL LRG LVL3 (GOWN DISPOSABLE) ×4 IMPLANT
GOWN STRL REUS W/TWL XL LVL3 (GOWN DISPOSABLE) ×2 IMPLANT
GUIDEWIRE AMPLAZ .035X145 (WIRE) ×2 IMPLANT
GUIDEWIRE STR DUAL SENSOR (WIRE) ×4 IMPLANT
HLDR NDL AMPLATZ W/INSERTS (MISCELLANEOUS) ×1 IMPLANT
HOLDER NEEDLE AMPLATZ W/INSERT (MISCELLANEOUS) ×2 IMPLANT
IV SET EXTENSION CATH 6 NF (IV SETS) ×1 IMPLANT
KIT BASIN OR (CUSTOM PROCEDURE TRAY) ×2 IMPLANT
MANIFOLD NEPTUNE II (INSTRUMENTS) ×2 IMPLANT
NDL TROCAR 18X15 ECHO (NEEDLE) IMPLANT
NDL TROCAR 18X20 (NEEDLE) IMPLANT
NEEDLE TROCAR 18X15 ECHO (NEEDLE) ×4 IMPLANT
NEEDLE TROCAR 18X20 (NEEDLE) IMPLANT
NS IRRIG 1000ML POUR BTL (IV SOLUTION) ×1 IMPLANT
PACK CYSTO (CUSTOM PROCEDURE TRAY) ×2 IMPLANT
PAD ABD 8X10 STRL (GAUZE/BANDAGES/DRESSINGS) ×1 IMPLANT
PROBE LITHOCLAST ULTRA 3.8X403 (UROLOGICAL SUPPLIES) ×1 IMPLANT
PROBE PNEUMATIC 1.0MMX570MM (UROLOGICAL SUPPLIES) ×1 IMPLANT
SET AMPLATZ RENAL DILATOR (MISCELLANEOUS) IMPLANT
SET IRRIG Y TYPE TUR BLADDER L (SET/KITS/TRAYS/PACK) ×2 IMPLANT
SHEATH PEELAWAY SET 9 (SHEATH) IMPLANT
SPONGE LAP 4X18 X RAY DECT (DISPOSABLE) ×2 IMPLANT
STENT CONTOUR 6FRX26X.038 (STENTS) ×1 IMPLANT
STONE CATCHER W/TUBE ADAPTER (UROLOGICAL SUPPLIES) ×2 IMPLANT
SUT SILK 2 0 30  PSL (SUTURE) ×2
SUT SILK 2 0 30 PSL (SUTURE) ×1 IMPLANT
SYR 10ML LL (SYRINGE) ×2 IMPLANT
SYR 20CC LL (SYRINGE) ×4 IMPLANT
SYR 50ML LL SCALE MARK (SYRINGE) ×2 IMPLANT
TOWEL OR 17X26 10 PK STRL BLUE (TOWEL DISPOSABLE) ×2 IMPLANT
TUBE CONNECTING VINYL 14FR 30C (MISCELLANEOUS) ×1 IMPLANT
TUBING CONNECTING 10 (TUBING) ×4 IMPLANT
WATER STERILE IRR 1500ML POUR (IV SOLUTION) ×1 IMPLANT

## 2016-05-31 NOTE — Transfer of Care (Signed)
Immediate Anesthesia Transfer of Care Note  Patient: [name]  Procedure(s) Performed: Procedure(s): NEPHROLITHOTOMY PERCUTANEOUS WITH SURGEON ACCESS (Right)  Patient Location: PACU  Anesthesia Type:General  Level of Consciousness:  sedated, patient cooperative and responds to stimulation  Airway & Oxygen Therapy:Patient Spontanous Breathing and Patient connected to face mask oxgen  Post-op Assessment:  Report given to PACU RN and Post -op Vital signs reviewed and stable  Post vital signs:  Reviewed and stable  Last Vitals:  Vitals:   05/31/16 1018  BP: (!) 147/77  Pulse: (!) 111  Resp: 18  Temp: 75.3 C    Complications: No apparent anesthesia complications

## 2016-05-31 NOTE — H&P (Signed)
Urology Admission H&P  Chief Complaint: gross hematureia  History of Present Illness: [name] is a 66yo with a hx of a large right renal calculus >2cm  Past Medical History:  Diagnosis Date  . Essential hypertension   . Headache(784.0)   . Hyperlipidemia   . Type 2 diabetes mellitus ([place])   . Vaginitis    Past Surgical History:  Procedure Laterality Date  . ABDOMINAL HYSTERECTOMY    . TOTAL ABDOMINAL HYSTERECTOMY W/ BILATERAL SALPINGOOPHORECTOMY  1998    Home Medications:  Prescriptions Prior to Admission  Medication Sig Dispense Refill Last Dose  . amLODipine (NORVASC) 5 MG tablet TAKE 1 TABLET BY MOUTH EVERY DAY 90 tablet 1 05/31/2016 at 0800  . atorvastatin (LIPITOR) 10 MG tablet Take 1 tablet (10 mg total) by mouth daily. 30 tablet 5 Past Week at Unknown time  . glipiZIDE (GLUCOTROL) 10 MG tablet TAKE 1 TABLET BY MOUTH TWICE A DAY BEFORE MEALS 180 tablet 1 05/30/2016 at 0700  . Insulin Glargine (LANTUS SOLOSTAR) 100 UNIT/ML Solostar Pen INJECT 20 UNITS INTO THE SKIN EVERY EVENING (Patient taking differently: Inject 18 Units into the skin at bedtime. ) 45 mL 5 05/30/2016 at 1730  . metFORMIN (GLUCOPHAGE) 1000 MG tablet Take 1 tablet (1,000 mg total) by mouth 2 (two) times daily with a meal. 180 tablet 0 05/30/2016 at 1900  . Insulin Pen Needle (B-D ULTRAFINE III SHORT PEN) 31G X 8 MM MISC FOR USE WITH INSULIN 100 each 11 Taking   Allergies:  Allergies  Allergen Reactions  . Onglyza [Saxagliptin Hydrochloride] Swelling    Tongue swell  . Pravastatin Other (See Comments)    Muscle aches  . Ace Inhibitors Cough    Family History  Problem Relation Age of Onset  . Esophageal cancer Father   . Diabetes Sister   . Hypertension Sister   . Seizures Brother    Social History:  reports that she has never smoked. She has never used smokeless tobacco. She reports that she does not drink alcohol or use drugs.  Review of Systems  Genitourinary: Positive for flank pain and  hematuria.  All other systems reviewed and are negative.   Physical Exam:  Vital signs in last 24 hours: Temp:  [98.3 F (36.8 C)] 98.3 F (36.8 C) (03/26 1018) Pulse Rate:  [111] 111 (03/26 1018) Resp:  [18] 18 (03/26 1018) BP: (147)/(77) 147/77 (03/26 1018) SpO2:  [98 %] 98 % (03/26 1018) Weight:  [66.7 kg (147 lb)] 66.7 kg (147 lb) (03/26 1003) Physical Exam  Constitutional: She is oriented to person, place, and time. She appears well-developed and well-nourished.  HENT:  Head: Normocephalic and atraumatic.  Eyes: EOM are normal. Pupils are equal, round, and reactive to light.  Neck: Normal range of motion. No thyromegaly present.  Cardiovascular: Normal rate and regular rhythm.   Respiratory: Effort normal. No respiratory distress.  GI: Soft. She exhibits no distension.  Musculoskeletal: Normal range of motion. She exhibits no edema.  Neurological: She is alert and oriented to person, place, and time.  Skin: Skin is warm and dry.  Psychiatric: She has a normal mood and affect. Her behavior is normal. Judgment and thought content normal.    Laboratory Data:  Results for orders placed or performed during the hospital encounter of 05/31/16 (from the past 24 hour(s))  Glucose, capillary     Status: Abnormal   Collection Time: 05/31/16  9:35 AM  Result Value Ref Range   Glucose-Capillary 152 (H) 65 -  99 mg/dL   Comment 1 Notify RN    No results found for this or any previous visit (from the past 240 hour(s)). Creatinine:  Recent Labs  05/25/16 1101  CREATININE 0.51   Baseline Creatinine: 0.5  Impression/Assessment:  66yo with right renal calculus  Plan: The risks/benefits/alternaives to R PCNL was explained to the patient and she understands and wishes to proceed with surgery  [name] 05/31/2016, 11:27 AM

## 2016-05-31 NOTE — Anesthesia Postprocedure Evaluation (Signed)
Anesthesia Post Note  Patient: [name]  Procedure(s) Performed: Procedure(s) (LRB): NEPHROLITHOTOMY PERCUTANEOUS WITH SURGEON ACCESS (Right)  Patient location during evaluation: PACU Anesthesia Type: General Level of consciousness: awake and alert Pain management: pain level controlled Vital Signs Assessment: post-procedure vital signs reviewed and stable Respiratory status: spontaneous breathing, nonlabored ventilation and respiratory function stable Cardiovascular status: blood pressure returned to baseline and stable Postop Assessment: no signs of nausea or vomiting Anesthetic complications: no       Last Vitals:  Vitals:   05/31/16 1445 05/31/16 1500  BP: (!) 103/57 132/68  Pulse: 78 86  Resp: 13 14  Temp: 36.9 C 36.9 C    Last Pain:  Vitals:   05/31/16 1445  TempSrc:   PainSc: Asleep                 Lynda Rainwater

## 2016-05-31 NOTE — Anesthesia Procedure Notes (Signed)
Procedure Name: Intubation Date/Time: 05/31/2016 11:39 AM Performed by: Montoya Brandel, Virgel Gess Pre-anesthesia Checklist: Patient identified, Emergency Drugs available, Suction available, Patient being monitored and Timeout performed Patient Re-evaluated:Patient Re-evaluated prior to inductionOxygen Delivery Method: Circle system utilized Preoxygenation: Pre-oxygenation with 100% oxygen Intubation Type: IV induction Ventilation: Mask ventilation without difficulty Laryngoscope Size: Mac and 4 Grade View: Grade III Tube type: Oral Tube size: 7.5 mm Number of attempts: 1 Airway Equipment and Method: Bougie stylet Placement Confirmation: ETT inserted through vocal cords under direct vision,  positive ETCO2,  CO2 detector and breath sounds checked- equal and bilateral Secured at: 22 cm Tube secured with: Tape Dental Injury: Teeth and Oropharynx as per pre-operative assessment  Comments: Anterior G3 view, easy pass with bougie.

## 2016-05-31 NOTE — Anesthesia Preprocedure Evaluation (Addendum)
Anesthesia Evaluation  Patient identified by MRN, date of birth, ID band Patient awake    Reviewed: Allergy & Precautions, NPO status , Patient's Chart, lab work & pertinent test results  Airway Mallampati: II  TM Distance: >3 FB Neck ROM: Full    Dental no notable dental hx.    Pulmonary neg pulmonary ROS,    Pulmonary exam normal breath sounds clear to auscultation       Cardiovascular hypertension, negative cardio ROS Normal cardiovascular exam Rhythm:Regular Rate:Normal     Neuro/Psych  Headaches, negative neurological ROS  negative psych ROS   GI/Hepatic negative GI ROS, Neg liver ROS,   Endo/Other  negative endocrine ROSdiabetes, Type 2, Insulin Dependent, Oral Hypoglycemic Agents  Renal/GU negative Renal ROS  negative genitourinary   Musculoskeletal negative musculoskeletal ROS (+)   Abdominal   Peds negative pediatric ROS (+)  Hematology negative hematology ROS (+)   Anesthesia Other Findings   Reproductive/Obstetrics negative OB ROS                             Anesthesia Physical Anesthesia Plan  ASA: III  Anesthesia Plan: General   Post-op Pain Management:    Induction: Intravenous  Airway Management Planned: Oral ETT  Additional Equipment:   Intra-op Plan:   Post-operative Plan: Extubation in OR  Informed Consent: I have reviewed the patients History and Physical, chart, labs and discussed the procedure including the risks, benefits and alternatives for the proposed anesthesia with the patient or authorized representative who has indicated his/her understanding and acceptance.   Dental advisory given  Plan Discussed with: CRNA  Anesthesia Plan Comments:        Anesthesia Quick Evaluation

## 2016-05-31 NOTE — Op Note (Signed)
Preoperative diagnosis: Right renal stone  Postoperative diagnosis: Same  Procedure 1.  Right percutaneous nephrostolithotomy for stone greater than 2 cm 2.  Right nephrostogram 3.  Intraoperative fluoroscopy, under 1 hour, with interpretation 4.  Placement of a 6 x 26 double-J ureteral stent. 5.  Percutaneous access into the Right renal collecting system 6.  Placement of a 14 French nephrostomy tube 7.  Dilation of percutaneous tract  Attending: Dr. Alyson Ingles  Anesthesia: General  Estimated blood loss: 100cc  Antibiotics: ancef  Drains: 1.  16 French Foley catheter 2.  6 x 26 Right double-J ureteral stent 3.  14 French nephrostomy tube  Specimens: Stone for analysis  Findings: 2cm renal pelvis stone. Multiple lower pole calculi  Indications: Patient is a 66 year old female/female with a history of large right renal stone.  After discussing treatment options and they decided to proceed with right percutaneous nephrostolithotomy.    Procedure in detail: Prior to procedure consent was obtained.  Patient was brought to the operating room and a timeout was done to ensure correct patient, correct procedure, and correct site.  General anesthesia was administered. The patients genetalia was prepped and draped. A flexible cystoscope was passed into the urethra and then the bladder. No masses or lesions were seen in the bladder. Ureteral orifices were in the normal anatomic location. A sensor wire was passed into the right ureter and up to the renal pelvis. A 6 french ureteral catheter was advanced over the wire and up to the renal pelvis the wire was then removed and so was the cystoscope.  A 16 French Foley catheter was in place and the ureteral catheter was secured with a 0 silk tie.  The patient was then placed in the prone position.   The right flank was then prepped and draped in usual sterile fashion.  Contrast was instilled throught the ureteral catheter and the bullseye technique was used  to gain access into the lower pole. Once we gained access a sensor wire was advanced through the spinal needle and down the ureter to the bladder. We then used an 8 french and 10 french dilater to dilate the tract. We then used a sheath to place a second wire down to the bladder.  We then made an incision at the level of the skin and over the wire we then placed a NephroMax dilator.  We dilated the nephrostomy tract to 30 [place] and held this 18 cm of water for 1 minute.  We then  placed the access sheath over the balloon.  The balloon was then deflated.  We then used a rigid nephroscope to perform nephroscopy.  We encountered a large lower pole stone. Using the LithoClast the stone was fragmented and the fragments were removed with graspers.  The stone fragments were sent for composition analysis.  We then removed the nephroscope and over the wire placed a 6 x 26 double-J ureteral stent.  The wire was then removed and good coil was noted in the renal pelvis under direct vision in the bladder under fluoroscopy.  We then placed a 14 French nephrostomy tube through the sheath into the renal pelvis.  The balloon was inflated with 3 mls of contrast.  We then removed the access sheath in and obtained another nephrostogram.  We noted minimal extravasation of contrast.  We then secured the nephrostomy tube with 0 silks in interrupted fashion.  Dressing was placed over the nephrostomy tube site and this then concluded the procedure was well-tolerated by the  patient.  Complications: None  Condition: Stable, extubated, transferred to PACU  Plan: Patient is to be admitted overnight for observation.  Foley catheter through the morning.  Pt is then to be discharged home and followup in 1 week for nephrostomy tube removal. Stent to be removed 1 week after nephrostomy tube

## 2016-05-31 NOTE — Care Management Note (Signed)
Case Management Note  Patient Details  Name: [name] MRN: [id number] Date of Birth: Jan 27, 1951  Subjective/Objective: 66 y/o f admitted w/R renal stone. s/p R PCN. From home.                   Action/Plan:d/c plan home.   Expected Discharge Date:                  Expected Discharge Plan:  Home/Self Care  In-House Referral:     Discharge planning Services     Post Acute Care Choice:    Choice offered to:     DME Arranged:    DME Agency:     HH Arranged:    HH Agency:     Status of Service:  In process, will continue to follow  If discussed at Long Length of Stay Meetings, dates discussed:    Additional Comments:  Dessa Phi, RN 05/31/2016, 3:48 PM

## 2016-06-01 DIAGNOSIS — E119 Type 2 diabetes mellitus without complications: Secondary | ICD-10-CM | POA: Diagnosis not present

## 2016-06-01 DIAGNOSIS — Z87442 Personal history of urinary calculi: Secondary | ICD-10-CM | POA: Diagnosis not present

## 2016-06-01 DIAGNOSIS — N200 Calculus of kidney: Secondary | ICD-10-CM | POA: Diagnosis not present

## 2016-06-01 DIAGNOSIS — Z794 Long term (current) use of insulin: Secondary | ICD-10-CM | POA: Diagnosis not present

## 2016-06-01 DIAGNOSIS — I10 Essential (primary) hypertension: Secondary | ICD-10-CM | POA: Diagnosis not present

## 2016-06-01 DIAGNOSIS — E785 Hyperlipidemia, unspecified: Secondary | ICD-10-CM | POA: Diagnosis not present

## 2016-06-01 DIAGNOSIS — Z79899 Other long term (current) drug therapy: Secondary | ICD-10-CM | POA: Diagnosis not present

## 2016-06-01 LAB — BASIC METABOLIC PANEL
Anion gap: 7 (ref 5–15)
BUN: 11 mg/dL (ref 6–20)
CO2: 28 mmol/L (ref 22–32)
Calcium: 9 mg/dL (ref 8.9–10.3)
Chloride: 103 mmol/L (ref 101–111)
Creatinine, Ser: 0.7 mg/dL (ref 0.44–1.00)
GFR calc Af Amer: >60 mL/min (ref >60)
GFR calc non Af Amer: >60 mL/min (ref >60)
Glucose, Bld: 191 mg/dL — ABNORMAL HIGH (ref 65–99)
Potassium: 4.1 mmol/L (ref 3.5–5.1)
Sodium: 138 mmol/L (ref 135–145)

## 2016-06-01 LAB — CBC
HCT: 35.6 % — ABNORMAL LOW (ref 36.0–46.0)
Hemoglobin: 11.5 g/dL — ABNORMAL LOW (ref 12.0–15.0)
MCH: 28.5 pg (ref 26.0–34.0)
MCHC: 32.3 g/dL (ref 30.0–36.0)
MCV: 88.1 fL (ref 78.0–100.0)
Platelets: 308 10*3/uL (ref 150–400)
RBC: 4.04 MIL/uL (ref 3.87–5.11)
RDW: 13.6 % (ref 11.5–15.5)
WBC: 8.9 10*3/uL (ref 4.0–10.5)

## 2016-06-01 LAB — GLUCOSE, CAPILLARY
Glucose-Capillary: 157 mg/dL — ABNORMAL HIGH (ref 65–99)
Glucose-Capillary: 245 mg/dL — ABNORMAL HIGH (ref 65–99)

## 2016-06-01 MED ORDER — OXYCODONE-ACETAMINOPHEN 5-325 MG PO TABS: 1.0000 | ORAL_TABLET | ORAL | 0 refills | Status: DC | PRN

## 2016-06-01 MED ORDER — CEFAZOLIN SODIUM-DEXTROSE 2-4 GM/100ML-% IV SOLN
2.0000 g | Freq: Three times a day (TID) | INTRAVENOUS | Status: DC
  Administered 2016-06-01: 2 g via INTRAVENOUS
  Filled 2016-06-01: qty 100

## 2016-06-01 NOTE — Discharge Instructions (Signed)
Percutaneous Nephrostomy Home Guide  Percutaneous nephrostomy is a procedure to insert a flexible tube into your kidney so that urine can leave your body. This procedure may be done if a medical condition prevents urine from leaving your kidney in the usual way. During the procedure, the nephrostomy tube is inserted in the right or left side of your lower back and is connected to an external drainage bag.  After you have a nephrostomy tube placed, urine will collect in the drainage bag outside of your body. You will need to empty and change the drainage bag as needed. You will also need to take steps to care for the area where the nephrostomy tube was inserted (tube insertion site).  How do I care for my nephrostomy tube?   Always keep your tubing, the leg bag, or the bedside drainage bag below the level of your kidney so that your urine drains freely.   Avoid activities that would cause bending or pulling of your tubing. Ask your health care provider what activities are safe for you.   When connecting your nephrostomy tube to a drainage bag, make sure that there are no kinks in the tubing and that your urine is draining freely. You may want to gently wrap an elastic bandage over the tubing. This will help keep the tubing in place and prevent it from kinking. Make sure there is no tension on the tubing so it does not become dislodged.   At night, you may want to connect your nephrostomy tube or the leg bag to a larger bedside drainage bag.  How do I empty the drainage bag?  Empty the leg bag or bedside drainage bag whenever it becomes ? full. Also empty it before you go to sleep. Most drainage bags have a drain at the bottom that allows urine to be emptied. Follow these basic steps:  1. Hold the drainage bag over a toilet or collection container. Use a measuring container if your health care provider told you to measure your urine.  2. Open the drain of the bag and allow the urine to drain out.  3. After all the  urine has drained from the drainage bag, close the drain fully.  4. Flush the urine down the toilet. If a collection container was used, rinse the container.    How do I change the dressing around the nephrostomy tube?  Change your dressing and clean your tube exit site as told by your health care provider. You may need to change the dressing every day for the first 2 weeks after having a nephrostomy tube inserted. After the first 2 weeks, you may be told to change the dressing two times a week.  Supplies needed:    Mild soap and water.   Split gauze pads, 4  4 inches (10 x 10 cm).   Gauze pads, 4  4 inches (10 x 10 cm).   Paper tape.  How to change the dressing:   Because of the location of your nephrostomy tube, you may need help from another person to complete dressing changes. Follow these basic steps:  1. Wash hands with soap and water.  2. Gently remove the tape and dressing from around the nephrostomy tube. Be careful not to pull on the tube while removing the dressing. Avoid using scissors because they may damage the tube.  3. Wash the skin around the tube with mild soap and water, rinse well, and pat the skin dry with a clean cloth.    to verify that it is still anchored in the skin. 6. Place two split gauze pads in and around the tube exit site. Do not apply ointments or alcohol to the site. 7. Place a gauze pad on top of the split gauze pad. 8. Coil the tube on top of the gauze. The tubing should rest on the gauze, not on the skin. 9. Place tape around each edge of the gauze pad. 10. Secure the nephrostomy tubing. Make sure that the tube does not kink or become pinched. The tubing should rest on the gauze pad, not on the skin. 11. Dispose of used supplies properly. How do I flush my nephrostomy tube? Use a saline syringe to rinse out (flush) your  nephrostomy tube as told by your health care provider. Flushing is easier if a three-way stopcock is placed between the tube and the drainage bag. One connection of the stopcock connects to your tube, the second connects to the drainage bag, and the third is usually covered with a cap. The lever on the stopcock points to the direction on the stopcock that is closed to flow. Normally, the lever points in the direction of the cap to allow urine to drain from the tube to the drainage bag. Supplies needed:   Rubbing alcohol wipe.  10 mL 0.9% saline syringe. How to flush the tube:  1. Move the lever of the three-way stopcock so it points toward the drainage bag. 2. Clean the cap with a rubbing alcohol wipe. 3. Screw the tip of a 10 mL 0.9% saline syringe onto the cap. 4. Using the syringe plunger, slowly push the 10 mL 0.9% saline in the syringe over 5-10 seconds. If resistance is met or pain occurs while pushing, stop pushing the saline. 5. Remove the syringe from the cap. 6. Return the stopcock lever to the usual position, pointing in the direction of the cap. 7. Dispose of used supplies properly. How do I replace the drainage bag? Replace the drainage bag, three-way stopcock, and any extension tubing as told by your health care provider. Make sure you always have an extra drainage bag and connecting tubing available. 1. Empty urine from your drainage bag. 2. Gather a new drainage bag, three-way stopcock, and any extension tubing. 3. Remove the drainage bag, three-way stopcock, and any extension tubing from the nephrostomy tube. 4. Attach the new leg bag or bedside drainage bag, three-way stopcock, and any extension tubing to the nephrostomy tube. 5. Dispose of the used drainage bag, three-way stopcock, and any extension. Contact a health care provider if:  You have problems with any of the valves or tubing.  You have persistent pain or soreness in your back.  You have more redness, swelling,  or pain around your tube insertion site.  You have more fluid or blood coming from your tube insertion site.  Your tube insertion site feels warm to the touch.  You have pus or a bad smell coming from your tube insertion site.  You have increased urine output or you feel burning when urinating. Get help right away if:  You have pain in your abdomen during the first week.  You have chest pain or have trouble breathing.  You have a new appearance of blood in your urine.  You have a fever or chills.  You have back pain that is not relieved by your medicine.  You have decreased urine output.  Your nephrostomy tube comes out. This information is not intended to replace advice given  to you by your health care provider. Make sure you discuss any questions you have with your health care provider. Document Released: 12/13/2012 Document Revised: 12/05/2015 Document Reviewed: 12/05/2015 Elsevier Interactive Patient Education  2017 [employer].

## 2016-06-01 NOTE — Progress Notes (Signed)
Pt for discharge this afternoon to home. Pt to be discharged with Nephrostomy Tube. Pt given discharge teaching and instructions regarding home Nephrostomy Care. Pt shown and then demonstrated ability to empty large foley bag and able to return demonstration on changing from large foley bag to leg bag. Dressing instructions explained to Pt and Pt states family will help change Nephrostomy dressing as needed. Discharge packet includes Prescription, discharge instructions regarding medications and home care for Nephrostomy tube and follow up appointments. Pt verbalized understanding of all discharge teaching.

## 2016-06-01 NOTE — Care Management Note (Signed)
Case Management Note  Patient Details  Name: [name] MRN: [id number] Date of Birth: 02/12/1951  Subjective/Objective:                    Action/Plan:d/c home.   Expected Discharge Date:  06/01/16               Expected Discharge Plan:  Home/Self Care  In-House Referral:     Discharge planning Services     Post Acute Care Choice:    Choice offered to:     DME Arranged:    DME Agency:     HH Arranged:    HH Agency:     Status of Service:  Completed, signed off  If discussed at [employer] of Stay Meetings, dates discussed:    Additional Comments:  Dessa Phi, RN 06/01/2016, 9:45 AM

## 2016-06-02 NOTE — Discharge Summary (Signed)
Physician Discharge Summary  Patient ID: [name] MRN: [id number] DOB/AGE: Oct 27, 1950 66 y.o.  Admit date: 05/31/2016 Discharge date: 06/01/2016 Admission Diagnoses: Right renal calculus Discharge Diagnoses:  Active Problems:   Renal calculus, right   Discharged Condition: good  Hospital Course: The patient tolerated the procedure well and was transferred to the floor on IV pain meds, IV fluid. On POD#1 foley was removed, pt was started on clear liquid diet and they ambulated in the halls. Prior to discharge the pt was tolerating a regular diet, pain was controlled on PO pain meds, they were ambulating without difficulty, and they had normal bowel function.   Consults: None  Significant Diagnostic Studies: none  Treatments: surgery: PCNL  Discharge Exam: Blood pressure 125/60, pulse 86, temperature 97.5 F (36.4 C), temperature source Oral, resp. rate 16, height 5\' 4"  (1.626 m), weight 66.7 kg (147 lb), SpO2 100 %. General appearance: alert, cooperative and appears stated age Head: Normocephalic, without obvious abnormality, atraumatic Neck: no adenopathy, no carotid bruit, no JVD, supple, symmetrical, trachea midline and thyroid not enlarged, symmetric, no tenderness/mass/nodules Resp: clear to auscultation bilaterally Cardio: regular rate and rhythm, S1, S2 normal, no murmur, click, rub or gallop GI: soft, non-tender; bowel sounds normal; no masses,  no organomegaly Extremities: extremities normal, atraumatic, no cyanosis or edema  Disposition: 01-Home or Self Care  Discharge Instructions    Discharge patient    Complete by:  As directed    Discharge disposition:  01-Home or Self Care   Discharge patient date:  06/01/2016     Allergies as of 06/01/2016      Reactions   Onglyza [saxagliptin Hydrochloride] Swelling   Tongue swell   Pravastatin Other (See Comments)   Muscle aches   Ace Inhibitors Cough      Medication List    TAKE these medications    amLODipine 5 MG tablet Commonly known as:  NORVASC TAKE 1 TABLET BY MOUTH EVERY DAY   atorvastatin 10 MG tablet Commonly known as:  LIPITOR Take 1 tablet (10 mg total) by mouth daily.   glipiZIDE 10 MG tablet Commonly known as:  GLUCOTROL TAKE 1 TABLET BY MOUTH TWICE A DAY BEFORE MEALS   Insulin Glargine 100 UNIT/ML Solostar Pen Commonly known as:  LANTUS SOLOSTAR INJECT 20 UNITS INTO THE SKIN EVERY EVENING What changed:  how much to take  how to take this  when to take this  additional instructions   Insulin Pen Needle 31G X 8 MM Misc Commonly known as:  B-D ULTRAFINE III SHORT PEN FOR USE WITH INSULIN   metFORMIN 1000 MG tablet Commonly known as:  GLUCOPHAGE Take 1 tablet (1,000 mg total) by mouth 2 (two) times daily with a meal.   oxyCODONE-acetaminophen 5-325 MG tablet Commonly known as:  PERCOCET/ROXICET Take 1-2 tablets by mouth every 4 (four) hours as needed for moderate pain.      Follow-up Information    [name], MD. Call on 06/07/2016.   Specialty:  Urology Why:  for nephrostogram. Okay to See Azucena Fallen, NP Contact information: [place] [address] [phone]           Signed: [name] 06/02/2016, 7:56 PM

## 2016-06-03 DIAGNOSIS — N200 Calculus of kidney: Secondary | ICD-10-CM | POA: Diagnosis not present

## 2016-06-07 DIAGNOSIS — N202 Calculus of kidney with calculus of ureter: Secondary | ICD-10-CM | POA: Diagnosis not present

## 2016-06-10 ENCOUNTER — Ambulatory Visit (INDEPENDENT_AMBULATORY_CARE_PROVIDER_SITE_OTHER): Payer: Medicare Other | Admitting: Internal Medicine

## 2016-06-14 ENCOUNTER — Ambulatory Visit (INDEPENDENT_AMBULATORY_CARE_PROVIDER_SITE_OTHER): Payer: Medicare Other | Admitting: Otolaryngology

## 2016-06-19 ENCOUNTER — Emergency Department (HOSPITAL_COMMUNITY): Payer: Medicare Other

## 2016-06-19 ENCOUNTER — Encounter (HOSPITAL_COMMUNITY): Payer: Self-pay | Admitting: *Deleted

## 2016-06-19 ENCOUNTER — Observation Stay (HOSPITAL_COMMUNITY)
Admission: EM | Admit: 2016-06-19 | Discharge: 2016-06-20 | Disposition: A | Discharge status: Home / Self Care | Payer: Medicare Other | Attending: Family Medicine | Admitting: Family Medicine

## 2016-06-19 DIAGNOSIS — R270 Ataxia, unspecified: Secondary | ICD-10-CM | POA: Diagnosis not present

## 2016-06-19 DIAGNOSIS — G458 Other transient cerebral ischemic attacks and related syndromes: Secondary | ICD-10-CM

## 2016-06-19 DIAGNOSIS — Z79899 Other long term (current) drug therapy: Secondary | ICD-10-CM | POA: Insufficient documentation

## 2016-06-19 DIAGNOSIS — I10 Essential (primary) hypertension: Secondary | ICD-10-CM | POA: Diagnosis not present

## 2016-06-19 DIAGNOSIS — E785 Hyperlipidemia, unspecified: Secondary | ICD-10-CM | POA: Diagnosis not present

## 2016-06-19 DIAGNOSIS — Z794 Long term (current) use of insulin: Secondary | ICD-10-CM | POA: Diagnosis not present

## 2016-06-19 DIAGNOSIS — R29810 Facial weakness: Secondary | ICD-10-CM | POA: Diagnosis not present

## 2016-06-19 DIAGNOSIS — E119 Type 2 diabetes mellitus without complications: Secondary | ICD-10-CM | POA: Insufficient documentation

## 2016-06-19 DIAGNOSIS — G459 Transient cerebral ischemic attack, unspecified: Secondary | ICD-10-CM | POA: Diagnosis not present

## 2016-06-19 DIAGNOSIS — E1169 Type 2 diabetes mellitus with other specified complication: Secondary | ICD-10-CM

## 2016-06-19 DIAGNOSIS — R42 Dizziness and giddiness: Secondary | ICD-10-CM | POA: Diagnosis not present

## 2016-06-19 HISTORY — DX: Transient cerebral ischemic attack, unspecified: G45.9

## 2016-06-19 LAB — I-STAT CHEM 8, ED
BUN: 13 mg/dL (ref 6–20)
Calcium, Ion: 0.91 mmol/L — ABNORMAL LOW (ref 1.15–1.40)
Chloride: 106 mmol/L (ref 101–111)
Creatinine, Ser: 0.4 mg/dL — ABNORMAL LOW (ref 0.44–1.00)
Glucose, Bld: 140 mg/dL — ABNORMAL HIGH (ref 65–99)
HCT: 37 % (ref 36.0–46.0)
Hemoglobin: 12.6 g/dL (ref 12.0–15.0)
Potassium: 3.8 mmol/L (ref 3.5–5.1)
Sodium: 138 mmol/L (ref 135–145)
TCO2: 25 mmol/L (ref 0–100)

## 2016-06-19 LAB — COMPREHENSIVE METABOLIC PANEL
ALT: 16 U/L (ref 14–54)
AST: 17 U/L (ref 15–41)
Albumin: 4.5 g/dL (ref 3.5–5.0)
Alkaline Phosphatase: 103 U/L (ref 38–126)
Anion gap: 11 (ref 5–15)
BUN: 13 mg/dL (ref 6–20)
CO2: 26 mmol/L (ref 22–32)
Calcium: 9.3 mg/dL (ref 8.9–10.3)
Chloride: 105 mmol/L (ref 101–111)
Creatinine, Ser: 0.49 mg/dL (ref 0.44–1.00)
GFR calc Af Amer: >60 mL/min (ref >60)
GFR calc non Af Amer: >60 mL/min (ref >60)
Glucose, Bld: 139 mg/dL — ABNORMAL HIGH (ref 65–99)
Potassium: 3.5 mmol/L (ref 3.5–5.1)
Sodium: 142 mmol/L (ref 135–145)
Total Bilirubin: 0.6 mg/dL (ref 0.3–1.2)
Total Protein: 7.6 g/dL (ref 6.5–8.1)

## 2016-06-19 LAB — DIFFERENTIAL
Basophils Absolute: 0 10*3/uL (ref 0.0–0.1)
Basophils Relative: 1 %
Eosinophils Absolute: 0.1 10*3/uL (ref 0.0–0.7)
Eosinophils Relative: 2 %
Lymphocytes Relative: 36 %
Lymphs Abs: 1.8 10*3/uL (ref 0.7–4.0)
Monocytes Absolute: 0.5 10*3/uL (ref 0.1–1.0)
Monocytes Relative: 9 %
Neutro Abs: 2.8 10*3/uL (ref 1.7–7.7)
Neutrophils Relative %: 52 %

## 2016-06-19 LAB — URINALYSIS, ROUTINE W REFLEX MICROSCOPIC
Bilirubin Urine: NEGATIVE
Glucose, UA: 50 mg/dL — AB
Hgb urine dipstick: NEGATIVE
Ketones, ur: NEGATIVE mg/dL
Leukocytes, UA: NEGATIVE
Nitrite: NEGATIVE
Protein, ur: NEGATIVE mg/dL
Specific Gravity, Urine: 1.013 (ref 1.005–1.030)
pH: 7 (ref 5.0–8.0)

## 2016-06-19 LAB — CBC
HCT: 38.8 % (ref 36.0–46.0)
Hemoglobin: 12.3 g/dL (ref 12.0–15.0)
MCH: 28.7 pg (ref 26.0–34.0)
MCHC: 31.7 g/dL (ref 30.0–36.0)
MCV: 90.7 fL (ref 78.0–100.0)
Platelets: 391 10*3/uL (ref 150–400)
RBC: 4.28 MIL/uL (ref 3.87–5.11)
RDW: 13.8 % (ref 11.5–15.5)
WBC: 5.1 10*3/uL (ref 4.0–10.5)

## 2016-06-19 LAB — GLUCOSE, CAPILLARY
Glucose-Capillary: 143 mg/dL — ABNORMAL HIGH (ref 65–99)
Glucose-Capillary: 226 mg/dL — ABNORMAL HIGH (ref 65–99)

## 2016-06-19 LAB — ETHANOL: Alcohol, Ethyl (B): <5 mg/dL (ref <5)

## 2016-06-19 LAB — RAPID URINE DRUG SCREEN, HOSP PERFORMED
Amphetamines: NOT DETECTED
Barbiturates: NOT DETECTED
Benzodiazepines: NOT DETECTED
Cocaine: NOT DETECTED
Opiates: NOT DETECTED
Tetrahydrocannabinol: NOT DETECTED

## 2016-06-19 LAB — APTT: aPTT: 30 seconds (ref 24–36)

## 2016-06-19 LAB — PROTIME-INR
INR: 0.87
Prothrombin Time: 11.8 seconds (ref 11.4–15.2)

## 2016-06-19 LAB — I-STAT TROPONIN, ED: Troponin i, poc: 0.01 ng/mL (ref 0.00–0.08)

## 2016-06-19 LAB — CBG MONITORING, ED: Glucose-Capillary: 140 mg/dL — ABNORMAL HIGH (ref 65–99)

## 2016-06-19 MED ORDER — ASPIRIN 300 MG RE SUPP: 300.0000 mg | Freq: Every day | RECTAL | Status: DC

## 2016-06-19 MED ORDER — ATORVASTATIN CALCIUM 10 MG PO TABS
10.0000 mg | ORAL_TABLET | Freq: Every day | ORAL | Status: DC
  Administered 2016-06-19: 10 mg via ORAL
  Filled 2016-06-19: qty 1

## 2016-06-19 MED ORDER — GLIPIZIDE 5 MG PO TABS
10.0000 mg | ORAL_TABLET | Freq: Two times a day (BID) | ORAL | Status: DC
  Administered 2016-06-19 – 2016-06-20 (×2): 10 mg via ORAL
  Filled 2016-06-19 (×2): qty 2

## 2016-06-19 MED ORDER — MECLIZINE HCL 12.5 MG PO TABS
25.0000 mg | ORAL_TABLET | Freq: Once | ORAL | Status: AC
  Administered 2016-06-19: 25 mg via ORAL
  Filled 2016-06-19: qty 2

## 2016-06-19 MED ORDER — INSULIN GLARGINE 100 UNIT/ML ~~LOC~~ SOLN
18.0000 [IU] | Freq: Every day | SUBCUTANEOUS | Status: DC
  Administered 2016-06-19 – 2016-06-20 (×2): 18 [IU] via SUBCUTANEOUS
  Filled 2016-06-19 (×3): qty 0.18

## 2016-06-19 MED ORDER — INSULIN GLARGINE 100 UNIT/ML SOLOSTAR PEN: 18.0000 [IU] | PEN_INJECTOR | Freq: Every day | SUBCUTANEOUS | Status: DC

## 2016-06-19 MED ORDER — ACETAMINOPHEN 650 MG RE SUPP: 650.0000 mg | Freq: Four times a day (QID) | RECTAL | Status: DC | PRN

## 2016-06-19 MED ORDER — ASPIRIN 325 MG PO TABS
325.0000 mg | ORAL_TABLET | Freq: Every day | ORAL | Status: DC
  Administered 2016-06-20: 325 mg via ORAL
  Filled 2016-06-19: qty 1

## 2016-06-19 MED ORDER — STROKE: EARLY STAGES OF RECOVERY BOOK
Freq: Once | Status: DC
  Filled 2016-06-19: qty 1

## 2016-06-19 MED ORDER — INSULIN ASPART 100 UNIT/ML ~~LOC~~ SOLN
0.0000 [IU] | Freq: Three times a day (TID) | SUBCUTANEOUS | Status: DC
  Administered 2016-06-20: 3 [IU] via SUBCUTANEOUS
  Administered 2016-06-20: 1 [IU] via SUBCUTANEOUS

## 2016-06-19 MED ORDER — ACETAMINOPHEN 325 MG PO TABS: 650.0000 mg | ORAL_TABLET | Freq: Four times a day (QID) | ORAL | Status: DC | PRN

## 2016-06-19 MED ORDER — ASPIRIN 325 MG PO TABS
325.0000 mg | ORAL_TABLET | Freq: Once | ORAL | Status: AC
Start: 2016-06-19 — End: 2016-06-19
  Administered 2016-06-19: 325 mg via ORAL
  Filled 2016-06-19: qty 1

## 2016-06-19 MED ORDER — ENOXAPARIN SODIUM 40 MG/0.4ML ~~LOC~~ SOLN
40.0000 mg | SUBCUTANEOUS | Status: DC
  Administered 2016-06-19: 40 mg via SUBCUTANEOUS
  Filled 2016-06-19: qty 0.4

## 2016-06-19 MED ORDER — AMLODIPINE BESYLATE 5 MG PO TABS
5.0000 mg | ORAL_TABLET | Freq: Every day | ORAL | Status: DC
  Administered 2016-06-19 – 2016-06-20 (×2): 5 mg via ORAL
  Filled 2016-06-19 (×2): qty 1

## 2016-06-19 NOTE — H&P (Signed)
History and Physical  [name] GHW:[id number] DOB: [birth year] DOA: 06/19/2016  PCP: [name], MD  Patient coming from: home  Chief Complaint: difficulty walking  HPI:  56yow PMH HTN, hyperlipidemia, DM type 2 on insulin presented with difficulty walking, gait imbalance. Evaluated as code stroke, per EDP teleneurology recommended TIA evaluation. Only deficit gait ataxia.  Patient felt well yesterday, stayed up late last night, felt fine in bed this morning watching TV. When she got up, she had gait instability, "felt like I was going to the right or to the left." No specific aggravating or alleviating factors. No numbness, no focal weakness, no slurred speech or speech difficulties. Gait instability persisted, so came to ED for further evaluation.   Summary record review: d/c 3/27, treated for right renal calculus, s/p stent and nephrostomy tube placement  ED Course: afebrile, VSS, no hypoxia. Treated with ASA and meclizine. Code stroke called, teleneurology recommended TIA workup. Ataxic.  Pertinent labs: CMP and random glucose unremarkable, CBC and coags WNL, negative serum alcohol, u/a and UDS negative. Imaging: CT head no acute intracranial abnormality.  Review of Systems:  Negative for fever, new visual changes, sore throat, new muscle aches, chest pain, SOB, dysuria, bleeding, n/v/abdominal pain.  Reports inflamed skin area on back.  Past Medical History:  Diagnosis Date  . Essential hypertension   . Headache(784.0)   . Hyperlipidemia   . Type 2 diabetes mellitus ([place])   . Vaginitis     Past Surgical History:  Procedure Laterality Date  . NEPHROLITHOTOMY Right 05/31/2016   Procedure: NEPHROLITHOTOMY PERCUTANEOUS WITH SURGEON ACCESS;  Surgeon: Cleon Gustin, MD;  Location: [facility];  Service: Urology;  Laterality: Right;  . TOTAL ABDOMINAL HYSTERECTOMY W/ BILATERAL SALPINGOOPHORECTOMY  1998     reports that she has never smoked. She has never used  smokeless tobacco. She reports that she does not drink alcohol or use drugs.   Allergies  Allergen Reactions  . Onglyza [Saxagliptin Hydrochloride] Swelling    Tongue swell  . Pravastatin Other (See Comments)    Muscle aches  . Ace Inhibitors Cough    Family History  Problem Relation Age of Onset  . Esophageal cancer Father   . Diabetes Sister   . Hypertension Sister   . Seizures Brother      Prior to Admission medications   Medication Sig Start Date End Date Taking? Authorizing Provider  amLODipine (NORVASC) 5 MG tablet TAKE 1 TABLET BY MOUTH EVERY DAY 05/03/16  Yes Fayrene Helper, MD  atorvastatin (LIPITOR) 10 MG tablet Take 1 tablet (10 mg total) by mouth daily. 01/28/16  Yes Fayrene Helper, MD  glipiZIDE (GLUCOTROL) 10 MG tablet TAKE 1 TABLET BY MOUTH TWICE A DAY BEFORE MEALS 05/03/16  Yes Fayrene Helper, MD  Insulin Glargine (LANTUS SOLOSTAR) 100 UNIT/ML Solostar Pen INJECT 20 UNITS INTO THE SKIN EVERY EVENING Patient taking differently: Inject 18 Units into the skin at bedtime.  01/28/16  Yes Fayrene Helper, MD  Insulin Pen Needle (B-D ULTRAFINE III SHORT PEN) 31G X 8 MM MISC FOR USE WITH INSULIN 05/24/16  Yes Fayrene Helper, MD  metFORMIN (GLUCOPHAGE) 1000 MG tablet Take 1 tablet (1,000 mg total) by mouth 2 (two) times daily with a meal. 10/20/15  Yes Fayrene Helper, MD  oxyCODONE-acetaminophen (PERCOCET/ROXICET) 5-325 MG tablet Take 1-2 tablets by mouth every 4 (four) hours as needed for moderate pain. 06/01/16  Yes Cleon Gustin, MD    Physical Exam: Vitals:   06/19/16  1145 06/19/16 1200 06/19/16 1215 06/19/16 1230  BP: 127/75 123/70 136/72 125/73  Pulse: 79 71  73  Resp: 20 20 17 15   Temp:      TempSrc:      SpO2: 96% 98%  97%  Weight:      Height:        Constitutional:  . Appears calm and comfortable Eyes:  . PERRL and irises appear normal . Normal lids ENMT:  . external ears, nose appear normal . grossly normal hearing . Lips  appear normal Neck:  . neck appears normal, no masses . no thyromegaly Respiratory:  . CTA bilaterally, no w/r/r.  . Respiratory effort normal. Speaks in full sentences. Cardiovascular:  . RRR, no m/r/g . No LE extremity edema  Abdomen:  .  no tenderness or masses Musculoskeletal:  . Digits/nails both hands: no clubbing, cyanosis, petechiae, infection . RUE, LUE, RLE, LLE   o strength and tone normal, no atrophy, no abnormal movements o No tenderness, masses Skin:  . No rashes, lesions, ulcers . palpation of skin: no induration or nodules Neurologic:  . CN 2-12 intact . No pronator drift . No upper or lower extremity dysdiadokinesis  Psychiatric:  . judgement and insight appear normal . Mental status o Orientation to person, place, time o Mood, affect appropriate  Wt Readings from Last 3 Encounters:  06/19/16 66.7 kg (147 lb)  05/31/16 66.7 kg (147 lb)  05/25/16 66.7 kg (147 lb)    I have personally reviewed following labs and imaging studies  Labs on Admission:  CBC:  Recent Labs Lab 06/19/16 0949 06/19/16 0954  WBC  --  5.1  NEUTROABS  --  2.8  HGB 12.6 12.3  HCT 37.0 38.8  MCV  --  90.7  PLT  --  564   Basic Metabolic Panel:  Recent Labs Lab 06/19/16 0949 06/19/16 0954  NA 138 142  K 3.8 3.5  CL 106 105  CO2  --  26  GLUCOSE 140* 139*  BUN 13 13  CREATININE 0.40* 0.49  CALCIUM  --  9.3   Liver Function Tests:  Recent Labs Lab 06/19/16 0954  AST 17  ALT 16  ALKPHOS 103  BILITOT 0.6  PROT 7.6  ALBUMIN 4.5     Recent Labs Lab 06/19/16 0954  INR 0.87   CBG:  Recent Labs Lab 06/19/16 0943  GLUCAP 140*   Urine analysis:    Component Value Date/Time   COLORURINE STRAW (A) 06/19/2016 [place] 06/19/2016 0951   LABSPEC 1.013 06/19/2016 0951   PHURINE 7.0 06/19/2016 0951   GLUCOSEU 50 (A) 06/19/2016 0951   HGBUR NEGATIVE 06/19/2016 0951   BILIRUBINUR NEGATIVE 06/19/2016 0951   BILIRUBINUR moderate  02/16/2016 1603   KETONESUR NEGATIVE 06/19/2016 0951   PROTEINUR NEGATIVE 06/19/2016 0951   UROBILINOGEN 1.0 02/16/2016 1603   UROBILINOGEN 0.2 01/25/2014 0845   NITRITE NEGATIVE 06/19/2016 0951   LEUKOCYTESUR NEGATIVE 06/19/2016 0951    Radiological Exams on Admission: Ct Head Wo Contrast  Result Date: 06/19/2016 CLINICAL DATA:  Sudden onset of dizziness beginning today. No other weakness, slurred speech, or facial droop. Abnormal sensation to the lower extremities bilaterally. Initial encounter. Code stroke. EXAM: CT HEAD WITHOUT CONTRAST TECHNIQUE: Contiguous axial images were obtained from the base of the skull through the vertex without intravenous contrast. COMPARISON:  None. FINDINGS: Brain: Mild atrophy and white matter changes are slightly advanced for age. The basal ganglia are intact. Insular ribbon is normal bilaterally.  No focal cortical lesions are present. There is no hemorrhage or mass lesion. The ventricles are of normal size. No significant extraaxial fluid collection is present. The brainstem and cerebellum are normal. Vascular: No hyperdense vessel or unexpected calcification. Skull: The calvarium is intact. No focal lytic or blastic lesions are present. Sinuses/Orbits: The paranasal sinuses and mastoid air cells are clear. The globes and orbits are within normal limits. IMPRESSION: 1. No acute intracranial abnormality. 2. ASPECTS 10/10 3. Mild atrophy and white matter changes are somewhat advanced for age. This is nonspecific, but likely reflects the sequela of chronic microvascular ischemia in this patient with diabetes, hypertension, and hyperlipidemia. These results were called by telephone at the time of interpretation on 06/19/2016 at 10:12 am to Dr. Milton Ferguson , who verbally acknowledged these results. Electronically Signed   By: San Morelle M.D.   On: 06/19/2016 10:13    EKG: Independently reviewed: SR, no acute changes  Principal Problem:   TIA (transient  ischemic attack) Active Problems:   DM type 2 (diabetes mellitus, type 2) (H[facility])   Hyperlipidemia LDL goal <100   Essential hypertension   Ataxia   Assessment/Plan 1. Ataxic gait, sudden onset, no vertigo/dizziness. Suspicious for TIA or small CVA.   Teleneurology recommended TIA workup.  2. HTN. Stable.   Permissive HTN for now. Hold medications.  3. Hyperlipidemia.   Check FLP. Continue atorvastatin.  4. DM type 2 on oral meds.   Hold metformin, continue glipizide, SSI   It is my clinical opinion that referral for OBSERVATION is reasonable and necessary in this patient  The aforementioned taken together are felt to place the patient at high risk for further clinical deterioration. However it is anticipated that the patient may be medically stable for discharge from the hospital within 24 to 48 hours.   DVT prophylaxis: enoxaparin Code Status: full Family Communication: sister at bedside Consults called: teleneurology by EDP    Time spent: 3 minutes  Murray Hodgkins, MD  [facility] Direct contact: [phone] --[place]  --[email or web address]; password [email or web address]  7PM-7AM contact night coverage as above  06/19/2016, 12:38 PM

## 2016-06-19 NOTE — Progress Notes (Signed)
CODE STROKE 09:40 call, beeper [phone] exam started [phone] exam ended,images to [place] exam completed in [place] called [facility] spoke with Erline Levine

## 2016-06-19 NOTE — ED Triage Notes (Signed)
Pt reports she woke up this morning and had trouble with her balance and dizziness. Pt reports her legs feel "funny" but not weak. Pt states, "my legs feel light". Pt is trying to describe how her legs feel but states, "I'm having trouble trying to say how I feel". No slurred speech. Pt denies unilateral weakness. MD notified of pt's symptoms. Denies headache.

## 2016-06-19 NOTE — ED Provider Notes (Signed)
[facility] DEPT Provider Note   CSN: [id number] Arrival date & time: 06/19/16  3557  By signing my name below, I, Collene Leyden, attest that this documentation has been prepared under the direction and in the presence of Milton Ferguson, MD. Electronically Signed: Collene Leyden, Scribe. 06/19/16. 9:39 AM.  History   Chief Complaint Chief Complaint  Patient presents with  . Dizziness    HPI Comments: [name] is a 66 y.o. female with a history of HTN, HLD, and DM type 2, who presents to the Emergency Department complaining of sudden-onset dizziness that began earlier this morning. Patient states she was lying down watching TV, when she stood up and began feeling dizzy. Patient states as she continued ambulating she felt as if she was losing her balance. Patient denies vertigo. Patient reports that her "legs feel light". Patient denies any weakness. Patient states she has gradually improved since this morning. No modifying factors indicated. Patient is right hand dominant. Patient denies any slurred speech, numbness, or facial droop.   The history is provided by the patient. No language interpreter was used.  Dizziness  Quality:  Head spinning and imbalance Severity:  Mild Onset quality:  Sudden Timing:  Intermittent Progression:  Improving Chronicity:  New Context: not with loss of consciousness   Relieved by:  Nothing Worsened by:  Standing up Ineffective treatments:  None tried Associated symptoms: no chest pain, no diarrhea, no headaches, no nausea and no weakness     Past Medical History:  Diagnosis Date  . Essential hypertension   . Headache(784.0)   . Hyperlipidemia   . Type 2 diabetes mellitus ([place])   . Vaginitis     Patient Active Problem List   Diagnosis Date Noted  . Renal calculus, right 05/31/2016  . Need for influenza vaccination 05/25/2016  . Need for vaccination with 13-polyvalent pneumococcal conjugate vaccine 05/25/2016  . Abdominal pain  05/25/2016  . Hematuria 02/24/2016  . Microscopic hematuria 02/24/2016  . Annual physical exam 06/06/2014  . Shoulder pain, right 01/25/2014  . Goiter 07/31/2013  . Neck pain on right side 01/23/2013  . Piles (hemorrhoids) 06/22/2011  . BACK PAIN, THORACIC REGION, RIGHT 01/27/2010  . Diabetes mellitus, insulin dependent (IDDM), controlled ([place]) 06/14/2008  . SKIN TAG 06/13/2008  . Hyperlipidemia LDL goal <100 06/30/2007  . Essential hypertension 06/30/2007    Past Surgical History:  Procedure Laterality Date  . ABDOMINAL HYSTERECTOMY    . NEPHROLITHOTOMY Right 05/31/2016   Procedure: NEPHROLITHOTOMY PERCUTANEOUS WITH SURGEON ACCESS;  Surgeon: Cleon Gustin, MD;  Location: [facility];  Service: Urology;  Laterality: Right;  . TOTAL ABDOMINAL HYSTERECTOMY W/ BILATERAL SALPINGOOPHORECTOMY  1998    OB History    No data available       Home Medications    Prior to Admission medications   Medication Sig Start Date End Date Taking? Authorizing Provider  amLODipine (NORVASC) 5 MG tablet TAKE 1 TABLET BY MOUTH EVERY DAY 05/03/16   Fayrene Helper, MD  atorvastatin (LIPITOR) 10 MG tablet Take 1 tablet (10 mg total) by mouth daily. 01/28/16   Fayrene Helper, MD  glipiZIDE (GLUCOTROL) 10 MG tablet TAKE 1 TABLET BY MOUTH TWICE A DAY BEFORE MEALS 05/03/16   Fayrene Helper, MD  Insulin Glargine (LANTUS SOLOSTAR) 100 UNIT/ML Solostar Pen INJECT 20 UNITS INTO THE SKIN EVERY EVENING Patient taking differently: Inject 18 Units into the skin at bedtime.  01/28/16   Fayrene Helper, MD  Insulin Pen Needle (B-D ULTRAFINE III SHORT  PEN) 31G X 8 MM MISC FOR USE WITH INSULIN 05/24/16   Fayrene Helper, MD  metFORMIN (GLUCOPHAGE) 1000 MG tablet Take 1 tablet (1,000 mg total) by mouth 2 (two) times daily with a meal. 10/20/15   Fayrene Helper, MD  oxyCODONE-acetaminophen (PERCOCET/ROXICET) 5-325 MG tablet Take 1-2 tablets by mouth every 4 (four) hours as needed for moderate pain.  06/01/16   Cleon Gustin, MD    Family History Family History  Problem Relation Age of Onset  . Esophageal cancer Father   . Diabetes Sister   . Hypertension Sister   . Seizures Brother     Social History Social History  Substance Use Topics  . Smoking status: Never Smoker  . Smokeless tobacco: Never Used  . Alcohol use No     Allergies   Onglyza [saxagliptin hydrochloride]; Pravastatin; and Ace inhibitors   Review of Systems Review of Systems  Constitutional: Negative for appetite change and fatigue.  HENT: Negative for congestion, ear discharge and sinus pressure.   Eyes: Negative for discharge.  Respiratory: Negative for cough.   Cardiovascular: Negative for chest pain.  Gastrointestinal: Negative for abdominal pain, diarrhea and nausea.  Genitourinary: Negative for frequency and hematuria.  Musculoskeletal: Positive for gait problem. Negative for back pain.  Skin: Negative for rash.  Neurological: Positive for dizziness. Negative for seizures, weakness, numbness and headaches.  Psychiatric/Behavioral: Negative for hallucinations.     Physical Exam Updated Vital Signs BP (!) 155/76   Pulse 78   Temp 98 F (36.7 C) (Oral)   Resp 19   Ht 5\' 4"  (1.626 m)   Wt 147 lb (66.7 kg)   SpO2 97%   BMI 25.23 kg/m   Physical Exam  Constitutional: She is oriented to person, place, and time. She appears well-developed.  HENT:  Head: Normocephalic.  Eyes: Conjunctivae and EOM are normal. No scleral icterus.  Neck: Neck supple. No thyromegaly present.  Cardiovascular: Normal rate and regular rhythm.  Exam reveals no gallop and no friction rub.   No murmur heard. Pulmonary/Chest: No stridor. She has no wheezes. She has no rales. She exhibits no tenderness.  Abdominal: She exhibits no distension. There is no tenderness. There is no rebound.  Musculoskeletal: Normal range of motion. She exhibits no edema.  Lymphadenopathy:    She has no cervical adenopathy.    Neurological: She is oriented to person, place, and time. She exhibits normal muscle tone. Coordination normal.  Mild unsteady gait.   Skin: No rash noted. No erythema.  Psychiatric: She has a normal mood and affect. Her behavior is normal.     ED Treatments / Results  DIAGNOSTIC STUDIES: Oxygen Saturation is 97% on RA, normal by my interpretation.    COORDINATION OF CARE: 9:38 AM Discussed treatment plan with pt at bedside and pt agreed to plan, which includes a full stroke work-up.   Labs (all labs ordered are listed, but only abnormal results are displayed) Labs Reviewed  CBG MONITORING, ED - Abnormal; Notable for the following:       Result Value   Glucose-Capillary 140 (*)    All other components within normal limits    EKG  EKG Interpretation None       Radiology No results found.  Procedures Procedures (including critical care time)  Medications Ordered in ED Medications - No data to display   Initial Impression / Assessment and Plan / ED Course  I have reviewed the triage vital signs and the nursing notes.  Pertinent labs & imaging results that were available during my care of the patient were reviewed by me and considered in my medical decision making (see chart for details).      11:09 AM  Discussed imaging results with the patient. Patient agreed to be admitted for a TIA work-up.   Marland Kitchenedcr CRITICAL CARE Performed by: Kaylen Motl L Total critical care time: 35 minutes Critical care time was exclusive of separately billable procedures and treating other patients. Critical care was necessary to treat or prevent imminent or life-threatening deterioration. Critical care was time spent personally by me on the following activities: development of treatment plan with patient and/or surrogate as well as nursing, discussions with consultants, evaluation of patient's response to treatment, examination of patient, obtaining history from patient or surrogate,  ordering and performing treatments and interventions, ordering and review of laboratory studies, ordering and review of radiographic studies, pulse oximetry and re-evaluation of patient's condition. Patient will be admitted for TIA workup. Patient either has unusual vertigo or TIA most likely  Final Clinical Impressions(s) / ED Diagnoses   Final diagnoses:  None    New Prescriptions New Prescriptions   No medications on file   The chart was scribed for me under my direct supervision.  I personally performed the history, physical, and medical decision making and all procedures in the evaluation of this patient.Milton Ferguson, MD 06/19/16 [phone]

## 2016-06-20 ENCOUNTER — Observation Stay (HOSPITAL_COMMUNITY): Payer: Medicare Other

## 2016-06-20 DIAGNOSIS — E119 Type 2 diabetes mellitus without complications: Secondary | ICD-10-CM | POA: Diagnosis not present

## 2016-06-20 DIAGNOSIS — G459 Transient cerebral ischemic attack, unspecified: Secondary | ICD-10-CM | POA: Diagnosis not present

## 2016-06-20 DIAGNOSIS — R270 Ataxia, unspecified: Secondary | ICD-10-CM | POA: Diagnosis not present

## 2016-06-20 DIAGNOSIS — I10 Essential (primary) hypertension: Secondary | ICD-10-CM | POA: Diagnosis not present

## 2016-06-20 DIAGNOSIS — E785 Hyperlipidemia, unspecified: Secondary | ICD-10-CM | POA: Diagnosis not present

## 2016-06-20 DIAGNOSIS — R531 Weakness: Secondary | ICD-10-CM | POA: Diagnosis not present

## 2016-06-20 DIAGNOSIS — R4781 Slurred speech: Secondary | ICD-10-CM | POA: Diagnosis not present

## 2016-06-20 LAB — GLUCOSE, CAPILLARY
Glucose-Capillary: 129 mg/dL — ABNORMAL HIGH (ref 65–99)
Glucose-Capillary: 240 mg/dL — ABNORMAL HIGH (ref 65–99)

## 2016-06-20 LAB — LIPID PANEL
Cholesterol: 205 mg/dL — ABNORMAL HIGH (ref 0–200)
HDL: 74 mg/dL (ref >40)
LDL Cholesterol: 116 mg/dL — ABNORMAL HIGH (ref 0–99)
Total CHOL/HDL Ratio: 2.8 RATIO
Triglycerides: 77 mg/dL (ref <150)
VLDL: 15 mg/dL (ref 0–40)

## 2016-06-20 MED ORDER — ASPIRIN EC 81 MG PO TBEC: 81.0000 mg | DELAYED_RELEASE_TABLET | Freq: Every day | ORAL | Status: DC

## 2016-06-20 NOTE — Discharge Summary (Signed)
Physician Discharge Summary  [name] SPQ:[id number] DOB: [birth year] DOA: 06/19/2016  PCP: Natalie Nakayama, MD  Admit date: 06/19/2016 Discharge date: 06/20/2016  Recommendations for Outpatient Follow-up:  1. Suspected TIA      Follow-up Information    Natalie Nakayama, MD. Schedule an appointment as soon as possible for a visit in 2 week(s).   Specialty:  Family Medicine Contact information: [address], [place] [address] [phone]          Discharge Diagnoses:  1. TIA  2. Essential HTN 3. Hyperlipidemia 4. DM type 2, not on insulin  Discharge Condition: improved Disposition: home  Diet recommendation: heart healthy, diabetic diet  Filed Weights   06/19/16 0941 06/19/16 1613  Weight: 66.7 kg (147 lb) 64.4 kg (142 lb)    History of present illness:  66yow PMH HTN, hyperlipidemia, DM type 2 on insulin presented with difficulty walking, gait imbalance. Evaluated as code stroke, per EDP teleneurology recommended TIA evaluation. Only deficit gait ataxia.  Hospital Course:  Observed overnight, ataxia spontaneously improved, ambulated well with PT, no further therapy recommended. No speech, swallowing or apparent cognitive problems, no indication for SLP or OT evaluation. No new deficits. MRI brain and MRI head unremarkable, as was carotid ultrasound and echo. Not previously on ASA. Plan start ASA and discharge home with outpatient follow-up.  1. TIA with gait ataxia. MRI negative. Much improved. LDL 116.  2. HTN, stable.  3. Hyperlipidemia. LDL 116. Continue atorvastatin. 4. DM type 2 on oral meds. Stable.   Consults: none  Procedures: 2-d echo from 04/05/2016  Impressions:  - Normal LV wall thickness with LVEF 60-65% and grade 1 diastolic   dysfunction. Mildly thickened mitral leaflets with trivial mitral   regurgitation. Trivial tricuspid regurgitation.  Today's assessment: S: feeling much better. No new issues, no weakness or  numbness, no difficulty swallowing. Ambulating without loss of balance. Ataxia much improved.  O: Vitals:   06/20/16 1235 06/20/16 1453  BP: 120/76 134/74  Pulse: 89 (!) 109  Resp: 18 18  Temp: 98.6 F (37 C) 99.2 F (37.3 C)   General: appears calm and comfortable. CV: RRR, no m/r/g. No LE edema. Telemetry SR. Respiratory: CTA bilaterally, no w/r/r. Normal respiratory effort. Neuro: CN appear intact Musculoskeletal: tone and strength appears symmetric and grossly normal all extremities.  Data reviewed: CBG stable Lipid panel MRI/A Carotid u/s   Discharge Instructions  Discharge Instructions    Activity as tolerated - No restrictions    Complete by:  As directed    Diet - low sodium heart healthy    Complete by:  As directed    Diet Carb Modified    Complete by:  As directed    Discharge instructions    Complete by:  As directed    Call your physician or seek immediate medical attention for weakness, numbness, falls, imbalance, difficulty speaking or swallowing or worsening of condition.     Allergies as of 06/20/2016      Reactions   Onglyza [saxagliptin Hydrochloride] Swelling   Tongue swell   Pravastatin Other (See Comments)   Muscle aches   Ace Inhibitors Cough      Medication List    STOP taking these medications   Insulin Pen Needle 31G X 8 MM Misc Commonly known as:  B-D ULTRAFINE III SHORT PEN     TAKE these medications   amLODipine 5 MG tablet Commonly known as:  NORVASC TAKE 1 TABLET BY MOUTH EVERY DAY  aspirin EC 81 MG tablet Take 1 tablet (81 mg total) by mouth daily.   atorvastatin 10 MG tablet Commonly known as:  LIPITOR Take 1 tablet (10 mg total) by mouth daily.   glipiZIDE 10 MG tablet Commonly known as:  GLUCOTROL TAKE 1 TABLET BY MOUTH TWICE A DAY BEFORE MEALS   Insulin Glargine 100 UNIT/ML Solostar Pen Commonly known as:  LANTUS SOLOSTAR INJECT 20 UNITS INTO THE SKIN EVERY EVENING What changed:  how much to take  how to  take this  when to take this  additional instructions   metFORMIN 1000 MG tablet Commonly known as:  GLUCOPHAGE Take 1 tablet (1,000 mg total) by mouth 2 (two) times daily with a meal.   oxyCODONE-acetaminophen 5-325 MG tablet Commonly known as:  PERCOCET/ROXICET Take 1-2 tablets by mouth every 4 (four) hours as needed for moderate pain.      Allergies  Allergen Reactions  . Onglyza [Saxagliptin Hydrochloride] Swelling    Tongue swell  . Pravastatin Other (See Comments)    Muscle aches  . Ace Inhibitors Cough    The results of significant diagnostics from this hospitalization (including imaging, microbiology, ancillary and laboratory) are listed below for reference.    Significant Diagnostic Studies: Ct Head Wo Contrast  Result Date: 06/19/2016 CLINICAL DATA:  Sudden onset of dizziness beginning today. No other weakness, slurred speech, or facial droop. Abnormal sensation to the lower extremities bilaterally. Initial encounter. Code stroke. EXAM: CT HEAD WITHOUT CONTRAST TECHNIQUE: Contiguous axial images were obtained from the base of the skull through the vertex without intravenous contrast. COMPARISON:  None. FINDINGS: Brain: Mild atrophy and white matter changes are slightly advanced for age. The basal ganglia are intact. Insular ribbon is normal bilaterally. No focal cortical lesions are present. There is no hemorrhage or mass lesion. The ventricles are of normal size. No significant extraaxial fluid collection is present. The brainstem and cerebellum are normal. Vascular: No hyperdense vessel or unexpected calcification. Skull: The calvarium is intact. No focal lytic or blastic lesions are present. Sinuses/Orbits: The paranasal sinuses and mastoid air cells are clear. The globes and orbits are within normal limits. IMPRESSION: 1. No acute intracranial abnormality. 2. ASPECTS 10/10 3. Mild atrophy and white matter changes are somewhat advanced for age. This is nonspecific, but  likely reflects the sequela of chronic microvascular ischemia in this patient with diabetes, hypertension, and hyperlipidemia. These results were called by telephone at the time of interpretation on 06/19/2016 at 10:12 am to Dr. Milton Ferguson , who verbally acknowledged these results. Electronically Signed   By: San Morelle M.D.   On: 06/19/2016 10:13   Mr Brain Wo Contrast  Result Date: 06/20/2016 CLINICAL DATA:  66 year old female with weakness and slurred speech. Initial encounter. EXAM: MRI HEAD WITHOUT CONTRAST TECHNIQUE: Multiplanar, multiecho pulse sequences of the brain and surrounding structures were obtained without intravenous contrast. COMPARISON:  Head CT without contrast 06/19/2016. Cervical spine MRI 03/26/2010 FINDINGS: Brain: No restricted diffusion to suggest acute infarction. No midline shift, mass effect, evidence of mass lesion, ventriculomegaly, extra-axial collection or acute intracranial hemorrhage. Cervicomedullary junction and pituitary are within normal limits. Cerebral volume is within normal limits for age. Patchy and confluent bilateral cerebral white matter T2 and FLAIR hyperintensity with some involvement of the deep white matter capsules. T2 heterogeneity throughout the bilateral deep gray matter nuclei, especially in the right thalamus. Confluent T2 heterogeneity in the pons. No cortical encephalomalacia. There is a small chronic microhemorrhage in the left superior parietal lobe (  series 9, image 18). No other chronic cerebral blood products identified. Negative cerebellum. Vascular: Major intracranial vascular flow voids are preserved. Skull and upper cervical spine: Negative. Normal bone marrow signal. Sinuses/Orbits: Negative orbit soft tissues. Visualized paranasal sinuses and mastoids are stable and well pneumatized. Other: Visible internal auditory structures appear normal. Negative scalp soft tissues. IMPRESSION: 1.  No acute intracranial abnormality. 2. Advanced  signal changes in the cerebral white matter, the deep gray matter nuclei, and pons are nonspecific but favored to reflect advanced chronic small vessel disease. Electronically Signed   By: Genevie Ann M.D.   On: 06/20/2016 13:48   US Carotid Bilateral (at [facility] Only)  Result Date: 06/20/2016 CLINICAL DATA:  History of TIA, hypertension, hyperlipidemia and diabetes. EXAM: BILATERAL CAROTID DUPLEX ULTRASOUND TECHNIQUE: Pearline Cables scale imaging, color Doppler and duplex ultrasound were performed of bilateral carotid and vertebral arteries in the neck. COMPARISON:  None. FINDINGS: Criteria: Quantification of carotid stenosis is based on velocity parameters that correlate the residual internal carotid diameter with NASCET-based stenosis levels, using the diameter of the distal internal carotid lumen as the denominator for stenosis measurement. The following velocity measurements were obtained: RIGHT ICA:  89/24 cm/sec CCA:  66/29 cm/sec SYSTOLIC ICA/CCA RATIO:  0.9 DIASTOLIC ICA/CCA RATIO:  1.3 ECA:  88 cm/sec LEFT ICA:  75/20 cm/sec CCA:  1 4/76 cm/sec SYSTOLIC ICA/CCA RATIO:  0.7 DIASTOLIC ICA/CCA RATIO:  1.2 ECA:  75 cm/sec RIGHT CAROTID ARTERY: There is no grayscale evidence of significant intimal thickening or atherosclerotic plaque affecting interrogated portions of the right carotid system. There are no elevated peak systolic velocities within the interrogated course the right internal carotid artery to suggest a hemodynamically significant stenosis. RIGHT VERTEBRAL ARTERY:  Antegrade Flow LEFT CAROTID ARTERY: There is no grayscale evidence significant intimal thickening or atherosclerotic plaque affecting the interrogated portions of the left carotid system. There are no elevated peak systolic velocities within the interrogated course of the left internal carotid artery to suggest a hemodynamically significant stenosis. LEFT VERTEBRAL ARTERY:  Antegrade Flow IMPRESSION: Unremarkable carotid Doppler ultrasound.  Electronically Signed   By: Sandi Mariscal M.D.   On: 06/20/2016 14:25   Dg C-arm 1-60 Min-no Report  Result Date: 05/31/2016 Fluoroscopy was utilized by the requesting physician.  No radiographic interpretation.   Mr Lovenia Kim  Result Date: 06/20/2016 CLINICAL DATA:  66 year old female with weakness and slurred speech. Initial encounter. EXAM: MRA HEAD WITHOUT CONTRAST TECHNIQUE: Angiographic images of the Circle of Willis were obtained using MRA technique without intravenous contrast. COMPARISON:  Brain MRI from today reported separately. FINDINGS: Antegrade flow in the posterior circulation with codominant distal vertebral arteries. No distal vertebral stenosis. Normal PICA origins. Patent vertebrobasilar junction. No basilar stenosis. SCA and PCA origins are normal. Both posterior communicating arteries are present. Bilateral PCA branches are within normal limits. Antegrade flow in both ICA siphons. Mild siphon tortuosity. No siphon stenosis. Ophthalmic and posterior communicating artery origins are normal. Normal carotid termini, MCA and ACA origins. Anterior communicating artery and visible ACA branches are within normal limits. Left MCA M1 segment, bifurcation, and visible left MCA branches are within normal limits. There is an early right MCA M1 bifurcation. Otherwise the right MCA M1 segment and visible branches are within normal limits. IMPRESSION: Negative intracranial MRA. Electronically Signed   By: Genevie Ann M.D.   On: 06/20/2016 13:50    Labs: Basic Metabolic Panel:  Recent Labs Lab 06/19/16 0949 06/19/16 0954  NA 138 142  K 3.8  3.5  CL 106 105  CO2  --  26  GLUCOSE 140* 139*  BUN 13 13  CREATININE 0.40* 0.49  CALCIUM  --  9.3   Liver Function Tests:  Recent Labs Lab 06/19/16 0954  AST 17  ALT 16  ALKPHOS 103  BILITOT 0.6  PROT 7.6  ALBUMIN 4.5   CBC:  Recent Labs Lab 06/19/16 0949 06/19/16 0954  WBC  --  5.1  NEUTROABS  --  2.8  HGB 12.6 12.3  HCT 37.0 38.8    MCV  --  90.7  PLT  --  391    CBG:  Recent Labs Lab 06/19/16 0943 06/19/16 1720 06/19/16 2107 06/20/16 0802 06/20/16 1141  GLUCAP 140* 143* 226* 129* 240*    Principal Problem:   TIA (transient ischemic attack) Active Problems:   DM type 2 (diabetes mellitus, type 2) ([place])   Hyperlipidemia LDL goal <100   Essential hypertension   Ataxia   Time coordinating discharge: 35 minutes  Signed:  Murray Hodgkins, MD [facility] 06/20/2016, 3:48 PM

## 2016-06-20 NOTE — Evaluation (Signed)
Physical Therapy Evaluation Patient Details Name: [name] MRN: [id number] DOB: [birth year] Today's Date: 06/20/2016   History of Present Illness  26yow PMH HTN, hyperlipidemia, DM type 2 on insulin presented with difficulty walking, gait imbalance. Evaluated as code stroke, per EDP teleneurology recommended TIA evaluation. Only deficit gait ataxia.  Head CT (-), and MRI is still pending.     Clinical Impression  Pt received in bed, and is agreeable to PT evaluation.  Pt reports that she was completely independent with unlimited community ambulation, as well as ADL's, and IADL's prior to admission.  During PT evaluation, she demonstrated independence with bed mobility, transfers, and gait x 575ft.  She does not demonstrate need for skilled PT at this time, and therefore, will sign off.      Follow Up Recommendations No PT follow up    Equipment Recommendations  None recommended by PT    Recommendations for Other Services       Precautions / Restrictions Precautions Precautions: None Restrictions Weight Bearing Restrictions: No      Mobility  Bed Mobility Overal bed mobility: Independent                Transfers Overall transfer level: Independent Equipment used: None                Ambulation/Gait Ambulation/Gait assistance: Independent Ambulation Distance (Feet): 500 Feet Assistive device: None Gait Pattern/deviations: WFL(Within Functional Limits)   Gait velocity interpretation: at or above normal speed for age/gender General Gait Details: no LOB or gait deviations noted.   Stairs            Wheelchair Mobility    Modified Rankin (Stroke Patients Only) Modified Rankin (Stroke Patients Only) Pre-Morbid Rankin Score: No symptoms Modified Rankin: No symptoms     Balance Overall balance assessment: No apparent balance deficits (not formally assessed)                                           Pertinent Vitals/Pain  Pain Assessment: No/denies pain    Home Living   Living Arrangements: Alone Available Help at Discharge: Available PRN/intermittently Type of Home: House Home Access: Stairs to enter   Entrance Stairs-Number of Steps: 16 +4 with HR.  However she normally drives up to the porch and then she only has the 4 steps to get to the front door.  Home Layout: One level Home Equipment: None      Prior Function Level of Independence: Independent   Gait / Transfers Assistance Needed: independent, driving, unlimited community ambulation  ADL's / Homemaking Assistance Needed: independent.         Hand Dominance   Dominant Hand: Right    Extremity/Trunk Assessment   Upper Extremity Assessment Upper Extremity Assessment: Overall WFL for tasks assessed    Lower Extremity Assessment Lower Extremity Assessment: Overall WFL for tasks assessed       Communication   Communication: No difficulties  Cognition Arousal/Alertness: Awake/alert Behavior During Therapy: WFL for tasks assessed/performed Overall Cognitive Status: Within Functional Limits for tasks assessed                                        General Comments      Exercises     Assessment/Plan    PT Assessment Patent  does not need any further PT services  PT Problem List         PT Treatment Interventions      PT Goals (Current goals can be found in the Care Plan section)  Acute Rehab PT Goals PT Goal Formulation: All assessment and education complete, DC therapy    Frequency     Barriers to discharge        Co-evaluation               End of Session Equipment Utilized During Treatment: Gait belt Activity Tolerance: Patient tolerated treatment well Patient left: with family/visitor present;with call bell/phone within reach (Sitting on the EOB to eat lunch) Nurse Communication: Mobility status Mikki Santee, RN notified of pt's mobility status, and mobility sheet left hanging in the pt's  room.  ) PT Visit Diagnosis: Ataxic gait (R26.0)    Time: 0479-9872 PT Time Calculation (min) (ACUTE ONLY): 17 min   Charges:   PT Evaluation $PT Eval Low Complexity: 1 Procedure     PT G Codes:   PT G-Codes **NOT FOR INPATIENT CLASS** Functional Assessment Tool Used: AM-PAC 6 Clicks Basic Mobility;Clinical judgement Functional Limitation: Mobility: Walking and moving around Mobility: Walking and Moving Around Current Status (J5872): 0 percent impaired, limited or restricted Mobility: Walking and Moving Around Goal Status (B6184): 0 percent impaired, limited or restricted Mobility: Walking and Moving Around Discharge Status (Q5927): 0 percent impaired, limited or restricted    [name], PT, DPT X: [phone]    [name] 06/20/2016, 12:03 PM

## 2016-06-20 NOTE — Progress Notes (Signed)
Pt given & discussed AVS information&questions answered.   Pt's sister is coming to take her home. Pt to be transported via wheelchair to her sisters car

## 2016-06-21 LAB — HEMOGLOBIN A1C
Hgb A1c MFr Bld: 7.3 % — ABNORMAL HIGH (ref 4.8–5.6)
Mean Plasma Glucose: 163 mg/dL

## 2016-06-22 DIAGNOSIS — N200 Calculus of kidney: Secondary | ICD-10-CM | POA: Diagnosis not present

## 2016-06-28 ENCOUNTER — Telehealth: Payer: Self-pay | Admitting: Family Medicine

## 2016-06-28 NOTE — Telephone Encounter (Signed)
FYI:   Per your request appt is schedule for 07-01-16 @8am  w/Dr. Moshe Cipro

## 2016-06-28 NOTE — Telephone Encounter (Signed)
noted 

## 2016-07-01 ENCOUNTER — Encounter: Payer: Self-pay | Admitting: Family Medicine

## 2016-07-01 ENCOUNTER — Ambulatory Visit (HOSPITAL_COMMUNITY)
Admission: RE | Admit: 2016-07-01 | Discharge: 2016-07-01 | Disposition: A | Discharge status: Home / Self Care | Payer: Medicare Other | Source: Ambulatory Visit | Attending: Family Medicine | Admitting: Family Medicine

## 2016-07-01 ENCOUNTER — Other Ambulatory Visit (HOSPITAL_COMMUNITY)
Admission: RE | Admit: 2016-07-01 | Discharge: 2016-07-01 | Disposition: A | Discharge status: Home / Self Care | Payer: Medicare Other | Attending: *Deleted | Admitting: *Deleted

## 2016-07-01 ENCOUNTER — Ambulatory Visit (INDEPENDENT_AMBULATORY_CARE_PROVIDER_SITE_OTHER): Payer: Medicare Other | Admitting: Family Medicine

## 2016-07-01 VITALS — BP 134/80 | HR 96 | Resp 16 | Ht 64.0 in | Wt 144.0 lb

## 2016-07-01 DIAGNOSIS — I10 Essential (primary) hypertension: Secondary | ICD-10-CM | POA: Diagnosis not present

## 2016-07-01 DIAGNOSIS — E119 Type 2 diabetes mellitus without complications: Secondary | ICD-10-CM | POA: Insufficient documentation

## 2016-07-01 DIAGNOSIS — R079 Chest pain, unspecified: Secondary | ICD-10-CM | POA: Diagnosis not present

## 2016-07-01 DIAGNOSIS — E1159 Type 2 diabetes mellitus with other circulatory complications: Secondary | ICD-10-CM | POA: Diagnosis not present

## 2016-07-01 DIAGNOSIS — R0789 Other chest pain: Secondary | ICD-10-CM

## 2016-07-01 DIAGNOSIS — M546 Pain in thoracic spine: Secondary | ICD-10-CM | POA: Diagnosis not present

## 2016-07-01 DIAGNOSIS — I152 Hypertension secondary to endocrine disorders: Secondary | ICD-10-CM

## 2016-07-01 DIAGNOSIS — I700 Atherosclerosis of aorta: Secondary | ICD-10-CM | POA: Diagnosis not present

## 2016-07-01 DIAGNOSIS — Z09 Encounter for follow-up examination after completed treatment for conditions other than malignant neoplasm: Secondary | ICD-10-CM

## 2016-07-01 NOTE — Assessment & Plan Note (Signed)
Reviewed hospital notes and studies with pt and the implications of recent stay. Focus is to improve chronic disease management, and for the pt to be compliant with medciations prescribed

## 2016-07-01 NOTE — Patient Instructions (Signed)
f/u in July as before , call if you need me sooner  pls get cXR today and also schedule your mammogram while there  Commit to lifestyle changes and meds as we discussed  Thank you  for choosing [facility]. We consider it a privelige to serve you.  Delivering excellent health care in a caring and  compassionate way is our goal.  Partnering with you,  so that together we can achieve this goal is our strategy.

## 2016-07-01 NOTE — Progress Notes (Signed)
[name]     MRN: [id number]      DOB: [birth year]   HPI [name]  Patient in for follow up of recent hospitalization.from 4/14 to 06/20/2016 with a dx of probable TIA Discharge summary, and laboratory and radiology data are reviewed, and any questions or concerns about recent hospitalization are discussed. Specific issues requiring follow up are specifically addressed. Also had right nephrostomy and stone removal, uncomplicated   ROS Denies recent fever or chills. Denies sinus pressure, nasal congestion, ear pain or sore throat. c/o posterior right chest pain and states that she had left chest pain the night before she presented to the hospital, denies , palpitations and leg swelling Denies abdominal pain, nausea, vomiting,diarrhea or constipation.   Denies dysuria, frequency, hesitancy or incontinence. Denies joint pain, swelling and limitation in mobility. Denies headaches, seizures, numbness, or tingling. Denies depression, anxiety or insomnia. Denies skin break down or rash.   PE  BP 134/80   Pulse 96   Resp 16   Ht 5\' 4"  (1.626 m)   Wt 144 lb (65.3 kg)   SpO2 98%   BMI 24.72 kg/m   Patient alert and oriented and in no cardiopulmonary distress.  HEENT: No facial asymmetry, EOMI,   oropharynx pink and moist.  Neck supple no JVD, no mass.  Chest: Clear to auscultation bilaterally.Tender to palpation over posterior right chest  CVS: S1, S2 no murmurs, no S3.Regular rate.  ABD: Soft non tender.   Ext: No edema  MS: Adequate ROM spine, shoulders, hips and knees.  Skin: Intact, no ulcerations or rash noted.  Psych: Good eye contact, normal affect. Memory intact not anxious or depressed appearing.  CNS: CN 2-12 intact, power,  normal throughout.no focal deficits noted.   [facility] discharge follow-up Reviewed hospital notes and studies with pt and the implications of recent stay. Focus is to improve chronic disease management, and  for the pt to be compliant with medciations prescribed  Essential hypertension Controlled, no change in medication DASH diet and commitment to daily physical activity for a minimum of 30 minutes discussed and encouraged, as a part of hypertension management. The importance of attaining a healthy weight is also discussed.  BP/Weight 07/01/2016 06/20/2016 06/19/2016 05/31/2016 05/25/2016 05/24/2016 5/36/6440  Systolic BP 347 425 - 956 387 564 332  Diastolic BP 80 74 - 60 77 84 70  Wt. (Lbs) 144 - 142 147 147 148 149.08  BMI 24.72 - 24.37 25.23 25.23 25.4 25.59       BACK PAIN, THORACIC REGION, RIGHT c/o chronic posterior right chest pain, will get CXR   DM type 2 (diabetes mellitus, type 2) ([place]) [name] is reminded of the importance of commitment to daily physical activity for 30 minutes or more, as able and the need to limit carbohydrate intake to 30 to 60 grams per meal to help with blood sugar control.   The need to take medication as prescribed, test blood sugar as directed, and to call between visits if there is a concern that blood sugar is uncontrolled is also discussed.   [name] is reminded of the importance of daily foot exam, annual eye examination, and good blood sugar, blood pressure and cholesterol control.  Diabetic Labs Latest Ref Rng & Units 07/01/2016 06/20/2016 06/19/2016 06/19/2016 06/01/2016  HbA1c 4.8 - 5.6 % - 7.3(H) - - -  Microalbumin Not Estab. ug/mL 42.2(H) - - - -  Micro/Creat Ratio 0.0 - 30.0 mg/g creat 46.1(H) - - - -  Chol 0 - 200 mg/dL - 205(H) - - -  HDL >40 mg/dL - 74 - - -  Calc LDL 0 - 99 mg/dL - 116(H) - - -  Triglycerides <150 mg/dL - 77 - - -  Creatinine 0.44 - 1.00 mg/dL - - 0.49 0.40(L) 0.70   BP/Weight 07/01/2016 06/20/2016 06/19/2016 05/31/2016 05/25/2016 05/24/2016 4/65/6812  Systolic BP 751 700 - 174 944 967 591  Diastolic BP 80 74 - 60 77 84 70  Wt. (Lbs) 144 - 142 147 147 148 149.08  BMI 24.72 - 24.37 25.23 25.23 25.4 25.59   Foot/eye exam  completion dates 05/24/2016 03/14/2015  Foot exam Order - -  Foot Form Completion Done Done      Controlled, no change in medication

## 2016-07-02 LAB — MICROALBUMIN / CREATININE URINE RATIO
Creatinine, Urine: 91.6 mg/dL
Microalb Creat Ratio: 46.1 mg/g creat — ABNORMAL HIGH (ref 0.0–30.0)
Microalb, Ur: 42.2 ug/mL — ABNORMAL HIGH

## 2016-07-04 NOTE — Assessment & Plan Note (Signed)
Controlled, no change in medication DASH diet and commitment to daily physical activity for a minimum of 30 minutes discussed and encouraged, as a part of hypertension management. The importance of attaining a healthy weight is also discussed.  BP/Weight 07/01/2016 06/20/2016 06/19/2016 05/31/2016 05/25/2016 05/24/2016 9/59/7471  Systolic BP 855 015 - 868 257 493 552  Diastolic BP 80 74 - 60 77 84 70  Wt. (Lbs) 144 - 142 147 147 148 149.08  BMI 24.72 - 24.37 25.23 25.23 25.4 25.59

## 2016-07-04 NOTE — Assessment & Plan Note (Signed)
c/o chronic posterior right chest pain, will get CXR

## 2016-07-04 NOTE — Assessment & Plan Note (Signed)
Ms. Fate is reminded of the importance of commitment to daily physical activity for 30 minutes or more, as able and the need to limit carbohydrate intake to 30 to 60 grams per meal to help with blood sugar control.   The need to take medication as prescribed, test blood sugar as directed, and to call between visits if there is a concern that blood sugar is uncontrolled is also discussed.   Ms. Haslip is reminded of the importance of daily foot exam, annual eye examination, and good blood sugar, blood pressure and cholesterol control.  Diabetic Labs Latest Ref Rng & Units 07/01/2016 06/20/2016 06/19/2016 06/19/2016 06/01/2016  HbA1c 4.8 - 5.6 % - 7.3(H) - - -  Microalbumin Not Estab. ug/mL 42.2(H) - - - -  Micro/Creat Ratio 0.0 - 30.0 mg/g creat 46.1(H) - - - -  Chol 0 - 200 mg/dL - 205(H) - - -  HDL >40 mg/dL - 74 - - -  Calc LDL 0 - 99 mg/dL - 116(H) - - -  Triglycerides <150 mg/dL - 77 - - -  Creatinine 0.44 - 1.00 mg/dL - - 0.49 0.40(L) 0.70   BP/Weight 07/01/2016 06/20/2016 06/19/2016 05/31/2016 05/25/2016 05/24/2016 6/65/9935  Systolic BP 701 779 - 390 300 923 300  Diastolic BP 80 74 - 60 77 84 70  Wt. (Lbs) 144 - 142 147 147 148 149.08  BMI 24.72 - 24.37 25.23 25.23 25.4 25.59   Foot/eye exam completion dates 05/24/2016 03/14/2015  Foot exam Order - -  Foot Form Completion Done Done      Controlled, no change in medication

## 2016-07-05 ENCOUNTER — Ambulatory Visit (INDEPENDENT_AMBULATORY_CARE_PROVIDER_SITE_OTHER): Payer: Medicare Other | Admitting: Family Medicine

## 2016-07-05 ENCOUNTER — Encounter: Payer: Self-pay | Admitting: Family Medicine

## 2016-07-05 VITALS — BP 140/80 | HR 100 | Resp 16 | Ht 64.0 in | Wt 143.0 lb

## 2016-07-05 DIAGNOSIS — M7552 Bursitis of left shoulder: Secondary | ICD-10-CM | POA: Diagnosis not present

## 2016-07-05 DIAGNOSIS — I10 Essential (primary) hypertension: Secondary | ICD-10-CM | POA: Diagnosis not present

## 2016-07-05 MED ORDER — MELOXICAM 15 MG PO TABS: 15.0000 mg | ORAL_TABLET | Freq: Every day | ORAL | 0 refills | Status: DC

## 2016-07-05 MED ORDER — KETOROLAC TROMETHAMINE 60 MG/2ML IM SOLN
60.0000 mg | Freq: Once | INTRAMUSCULAR | Status: AC
  Administered 2016-07-05: 60 mg via INTRAMUSCULAR

## 2016-07-05 MED ORDER — PREDNISONE 5 MG PO TABS: ORAL_TABLET | ORAL | 0 refills | Status: DC

## 2016-07-05 MED ORDER — METHYLPREDNISOLONE ACETATE 80 MG/ML IJ SUSP
80.0000 mg | Freq: Once | INTRAMUSCULAR | Status: AC
  Administered 2016-07-05: 80 mg via INTRAMUSCULAR

## 2016-07-05 NOTE — Assessment & Plan Note (Signed)
Sub optimal control, no med change DASH diet and commitment to daily physical activity for a minimum of 30 minutes discussed and encouraged, as a part of hypertension management. The importance of attaining a healthy weight is also discussed.  BP/Weight 07/05/2016 07/01/2016 06/20/2016 06/19/2016 05/31/2016 05/25/2016 2/54/9826  Systolic BP 415 830 940 - 768 088 110  Diastolic BP 80 80 74 - 60 77 84  Wt. (Lbs) 143 144 - 142 147 147 148  BMI 24.55 24.72 - 24.37 25.23 25.23 25.4

## 2016-07-05 NOTE — Progress Notes (Signed)
   [name]     MRN: [id number]      DOB: 12/24/1950   HPI [name] is here with a 2 day h/o pain and swelling of her left shoulder with no known trauma, has had similar occurrence butt not as severe, barely able to move the shoulder. Pain radiates to elbow  ROS Denies recent fever or chills. Denies sinus pressure, nasal congestion, ear pain or sore throat. Denies chest congestion, productive cough or wheezing. Denies chest pains, palpitations and leg swelling. Denies skin break down or rash.   PE  BP 140/80   Pulse 100   Resp 16   Ht 5\' 4"  (1.626 m)   Wt 143 lb (64.9 kg)   SpO2 96%   BMI 24.55 kg/m   Patient alert and oriented and in no cardiopulmonary distress.Pt in pain  HEENT: No facial asymmetry, EOMI,   oropharynx pink and moist.  Neck supple no JVD, no mass.  Chest: Clear to auscultation bilaterally.  CVS: S1, S2 no murmurs, no S3.Regular rate.  ABD: Soft non tender.   Ext: No edema  MS: Markedly decreased ROM left shoulder which is swollen and tender.    Assessment & Plan  Essential hypertension Sub optimal control, no med change DASH diet and commitment to daily physical activity for a minimum of 30 minutes discussed and encouraged, as a part of hypertension management. The importance of attaining a healthy weight is also discussed.  BP/Weight 07/05/2016 07/01/2016 06/20/2016 06/19/2016 05/31/2016 05/25/2016 10/31/35  Systolic BP 048 889 169 - 450 388 828  Diastolic BP 80 80 74 - 60 77 84  Wt. (Lbs) 143 144 - 142 147 147 148  BMI 24.55 24.72 - 24.37 25.23 25.23 25.4

## 2016-07-05 NOTE — Patient Instructions (Signed)
f/u as before , call if you need me sooner  You are treated for bursitis of left shoulder.  If symptoms persist or worsen call by mid week, I will refer you to orthopedics  Injections today in office , and you have two medications sent to your pharmacy also

## 2016-07-05 NOTE — Addendum Note (Signed)
Addended by: Eual Fines on: 07/05/2016 12:42 PM   Modules accepted: Orders

## 2016-07-06 ENCOUNTER — Other Ambulatory Visit: Payer: Self-pay | Admitting: Family Medicine

## 2016-07-06 DIAGNOSIS — Z09 Encounter for follow-up examination after completed treatment for conditions other than malignant neoplasm: Secondary | ICD-10-CM

## 2016-07-08 ENCOUNTER — Ambulatory Visit (HOSPITAL_COMMUNITY): Payer: Medicare Other

## 2016-07-13 ENCOUNTER — Ambulatory Visit (HOSPITAL_COMMUNITY)
Admission: RE | Admit: 2016-07-13 | Discharge: 2016-07-13 | Disposition: A | Discharge status: Home / Self Care | Payer: Medicare Other | Source: Ambulatory Visit | Attending: Family Medicine | Admitting: Family Medicine

## 2016-07-13 DIAGNOSIS — Z09 Encounter for follow-up examination after completed treatment for conditions other than malignant neoplasm: Secondary | ICD-10-CM

## 2016-07-13 DIAGNOSIS — R928 Other abnormal and inconclusive findings on diagnostic imaging of breast: Secondary | ICD-10-CM | POA: Insufficient documentation

## 2016-07-13 DIAGNOSIS — R922 Inconclusive mammogram: Secondary | ICD-10-CM | POA: Diagnosis not present

## 2016-09-02 ENCOUNTER — Other Ambulatory Visit: Payer: Self-pay | Admitting: Family Medicine

## 2016-09-03 NOTE — Telephone Encounter (Signed)
Seen 4 30 18

## 2016-09-16 ENCOUNTER — Telehealth: Payer: Self-pay | Admitting: Family Medicine

## 2016-09-16 NOTE — Telephone Encounter (Signed)
Spoke to patient, informed OTC would be fine.

## 2016-09-16 NOTE — Telephone Encounter (Signed)
New Message  Pt voiced she thinks she has a sinus infection due to her left ear being stopped up.  Pt voiced once before when ears were stopped up she took OTC medication and wondering if she can do the same this time around.  Please f/u

## 2016-09-30 ENCOUNTER — Other Ambulatory Visit: Payer: Self-pay | Admitting: Family Medicine

## 2016-09-30 DIAGNOSIS — Z794 Long term (current) use of insulin: Secondary | ICD-10-CM | POA: Diagnosis not present

## 2016-09-30 DIAGNOSIS — E119 Type 2 diabetes mellitus without complications: Secondary | ICD-10-CM | POA: Diagnosis not present

## 2016-09-30 LAB — BASIC METABOLIC PANEL WITH GFR
BUN: 16 mg/dL (ref 7–25)
CO2: 26 mmol/L (ref 20–31)
Calcium: 9.5 mg/dL (ref 8.6–10.4)
Chloride: 101 mmol/L (ref 98–110)
Creat: 0.61 mg/dL (ref 0.50–0.99)
GFR, Est African American: >89 mL/min (ref >=60)
GFR, Est Non African American: >89 mL/min (ref >=60)
Glucose, Bld: 134 mg/dL — ABNORMAL HIGH (ref 65–99)
Potassium: 4.6 mmol/L (ref 3.5–5.3)
Sodium: 139 mmol/L (ref 135–146)

## 2016-10-01 LAB — HEMOGLOBIN A1C
Hgb A1c MFr Bld: 7.6 % — ABNORMAL HIGH (ref <5.7)
Mean Plasma Glucose: 171 mg/dL

## 2016-10-04 ENCOUNTER — Telehealth: Payer: Self-pay | Admitting: Family Medicine

## 2016-10-04 ENCOUNTER — Ambulatory Visit (INDEPENDENT_AMBULATORY_CARE_PROVIDER_SITE_OTHER): Payer: Medicare Other | Admitting: Family Medicine

## 2016-10-04 ENCOUNTER — Encounter: Payer: Self-pay | Admitting: Family Medicine

## 2016-10-04 VITALS — HR 82 | Temp 98.7°F | Resp 14 | Ht 64.0 in | Wt 144.0 lb

## 2016-10-04 DIAGNOSIS — H6123 Impacted cerumen, bilateral: Secondary | ICD-10-CM | POA: Diagnosis not present

## 2016-10-04 DIAGNOSIS — E785 Hyperlipidemia, unspecified: Secondary | ICD-10-CM | POA: Diagnosis not present

## 2016-10-04 DIAGNOSIS — M546 Pain in thoracic spine: Secondary | ICD-10-CM

## 2016-10-04 DIAGNOSIS — H9202 Otalgia, left ear: Secondary | ICD-10-CM | POA: Diagnosis not present

## 2016-10-04 DIAGNOSIS — E119 Type 2 diabetes mellitus without complications: Secondary | ICD-10-CM

## 2016-10-04 DIAGNOSIS — I10 Essential (primary) hypertension: Secondary | ICD-10-CM | POA: Diagnosis not present

## 2016-10-04 DIAGNOSIS — E559 Vitamin D deficiency, unspecified: Secondary | ICD-10-CM

## 2016-10-04 LAB — LIPID PANEL
Cholesterol: 167 mg/dL (ref <200)
HDL: 78 mg/dL (ref >50)
LDL Cholesterol: 79 mg/dL (ref <100)
Total CHOL/HDL Ratio: 2.1 Ratio (ref <5.0)
Triglycerides: 51 mg/dL (ref <150)
VLDL: 10 mg/dL (ref <30)

## 2016-10-04 MED ORDER — INSULIN GLARGINE 100 UNIT/ML SOLOSTAR PEN: 22.0000 [IU] | PEN_INJECTOR | Freq: Every day | SUBCUTANEOUS | 99 refills | Status: DC

## 2016-10-04 NOTE — Assessment & Plan Note (Signed)
[name] is reminded of the importance of commitment to daily physical activity for 30 minutes or more, as able and the need to limit carbohydrate intake to 30 to 60 grams per meal to help with blood sugar control.   The need to take medication as prescribed, test blood sugar as directed, and to call between visits if there is a concern that blood sugar is uncontrolled is also discussed.   [name] is reminded of the importance of daily foot exam, annual eye examination, and good blood sugar, blood pressure and cholesterol control.  Diabetic Labs Latest Ref Rng & Units 09/30/2016 07/01/2016 06/20/2016 06/19/2016 06/19/2016  HbA1c <5.7 % 7.6(H) - 7.3(H) - -  Microalbumin Not Estab. ug/mL - 42.2(H) - - -  Micro/Creat Ratio 0.0 - 30.0 mg/g creat - 46.1(H) - - -  Chol <200 mg/dL 167 - 205(H) - -  HDL >50 mg/dL 78 - 74 - -  Calc LDL <100 mg/dL 79 - 116(H) - -  Triglycerides <150 mg/dL 51 - 77 - -  Creatinine 0.50 - 0.99 mg/dL 0.61 - - 0.49 0.40(L)   BP/Weight 10/04/2016 07/05/2016 07/01/2016 06/20/2016 06/19/2016 05/31/2016 8/84/1660  Systolic BP - 630 160 109 - 323 557  Diastolic BP - 80 80 74 - 60 77  Wt. (Lbs) 144 143 144 - 142 147 147  BMI 24.72 24.55 24.72 - 24.37 25.23 25.23   Foot/eye exam completion dates 05/24/2016 03/14/2015  Foot exam Order - -  Foot Form Completion Done Done   Deteriorated. Dose increase in insulin, needs diabetic ed, increase to 3 times daily testing and lifestyle change, will refer to endo once she calls for this

## 2016-10-04 NOTE — Assessment & Plan Note (Signed)
Inadequate control, not at goal, pt resistant to med increase currently, wants to work o lifestyle modification DASH diet and commitment to daily physical activity for a minimum of 30 minutes discussed and encouraged, as a part of hypertension management. The importance of attaining a healthy weight is also discussed.  BP/Weight 10/04/2016 07/05/2016 07/01/2016 06/20/2016 06/19/2016 05/31/2016 1/69/4503  Systolic BP - 888 280 034 - 917 915  Diastolic BP - 80 80 74 - 60 77  Wt. (Lbs) 144 143 144 - 142 147 147  BMI 24.72 24.55 24.72 - 24.37 25.23 25.23  will increase med dose at next visit if still elevated

## 2016-10-04 NOTE — Assessment & Plan Note (Signed)
Hyperlipidemia:Low fat diet discussed and encouraged.   Lipid Panel  Lab Results  Component Value Date   CHOL 167 09/30/2016   HDL 78 09/30/2016   LDLCALC 79 09/30/2016   TRIG 51 09/30/2016   CHOLHDL 2.1 09/30/2016   Controlled, no change in medication

## 2016-10-04 NOTE — Patient Instructions (Addendum)
F/u in 3.5 months, call if you need me before Wellness visit with nurse same day as MD follow up  Increase insulin to 22 units daily. Test and record 3 times daily  Make and keep appt with diabetic educator. Goal for fasting blood sugar ranges from 80 to 120 and 2 hours after any meal or at bedtime should be between 130 to 170.    Blood sugar is too high, call if you decide to go to diabetic specialist Blood pressure is 140 needs to be 130 or less  Stop adding or using salt in food It is important that you exercise regularly at least 30 minutes 7 times a week. If you develop chest pain, have severe difficulty breathing, or feel very tired, stop exercising immediately and seek medical attention  Weight loss goal of 5 to 7 pounds in next 3.5 months    you are referred to eNT re left ear pain and impacted wax  We will mail cholesterol result  Lab order for next visit will be mailed

## 2016-10-04 NOTE — Telephone Encounter (Signed)
Non fast cmp and EGFR and HBa1C and vit D needed 5 to 7 days prior to f/u  Also pls refer to diabetic educator for IDDM, thanks

## 2016-10-04 NOTE — Assessment & Plan Note (Signed)
Persistent left ear discomfort and bilateral cerumen impaction, refer to ENT

## 2016-10-04 NOTE — Progress Notes (Signed)
[name]     MRN: [id number]      DOB: 22-May-1950   HPI [name] is here for follow up and re-evaluation of chronic medical conditions, medication management and review of any available recent lab and radiology data.  Preventive health is updated, specifically  Cancer screening and Immunization.   Questions or concerns regarding consultations or procedures which the PT has had in the interim are  addressed. The PT denies any adverse reactions to current medications since the last visit.  Recent h/o left facial pressure and ear pain, still  has bilateral ear discomfort , esp in left ear , wants ENT eval Concerned about uncontrolled diabetes, considering serein specialist, not currently testing regularly Will call back with eye specialist she wishes to see Exercise on av 2 to 3 times weekly , not testing daily Denies polyuria, polydipsia, blurred vision , or hypoglycemic episodes. Concerned about recent TIA, wants more proactive measures to e taken, reviewed the importance of VERY tight blood pressure, blood sugar and lipid control with lifestyle modification commitment which she is not currently practicing, also offered neuro eval, also reiterated the need to remain on Asprin 81 mg daily  ROS Denies recent fever or chills. Denies chest congestion, productive cough or wheezing. Denies chest pains, palpitations and leg swelling Denies abdominal pain, nausea, vomiting,diarrhea or constipation.   Denies dysuria, frequency, hesitancy or incontinence. C/o neck and left upper extremity pain. Denies headaches, seizures, numbness, or tingling. Denies depression, anxiety or insomnia. Denies skin break down or rash.   PE  Pulse 82   Temp 98.7 F (37.1 C) (Other (Comment))   Resp 14   Ht 5\' 4"  (1.626 m)   Wt 144 lb (65.3 kg)   SpO2 97%   BMI 24.72 kg/m   Patient alert and oriented and in no cardiopulmonary distress.  HEENT: No facial asymmetry, EOMI,   oropharynx pink and  moist.  Neck supple no JVD, no mass.Bilateral cerumen impaction left greater than right Chest: Clear to auscultation bilaterally.  CVS: S1, S2 no murmurs, no S3.Regular rate.  ABD: Soft non tender.   Ext: No edema  MS: Adequate ROM spine, shoulders, hips and knees.  Skin: Intact, no ulcerations or rash noted.  Psych: Good eye contact, normal affect. Memory intact not anxious or depressed appearing.  CNS: CN 2-12 intact, power,  normal throughout.no focal deficits noted.   Assessment & Plan Essential hypertension Inadequate control, not at goal, pt resistant to med increase currently, wants to work o lifestyle modification DASH diet and commitment to daily physical activity for a minimum of 30 minutes discussed and encouraged, as a part of hypertension management. The importance of attaining a healthy weight is also discussed.  BP/Weight 10/04/2016 07/05/2016 07/01/2016 06/20/2016 06/19/2016 05/31/2016 2/87/8676  Systolic BP - 720 947 096 - 283 662  Diastolic BP - 80 80 74 - 60 77  Wt. (Lbs) 144 143 144 - 142 147 147  BMI 24.72 24.55 24.72 - 24.37 25.23 25.23  will increase med dose at next visit if still elevated     DM type 2 (diabetes mellitus, type 2) ([place]) [name] is reminded of the importance of commitment to daily physical activity for 30 minutes or more, as able and the need to limit carbohydrate intake to 30 to 60 grams per meal to help with blood sugar control.   The need to take medication as prescribed, test blood sugar as directed, and to call between visits if there is a  concern that blood sugar is uncontrolled is also discussed.   [name] is reminded of the importance of daily foot exam, annual eye examination, and good blood sugar, blood pressure and cholesterol control.  Diabetic Labs Latest Ref Rng & Units 09/30/2016 07/01/2016 06/20/2016 06/19/2016 06/19/2016  HbA1c <5.7 % 7.6(H) - 7.3(H) - -  Microalbumin Not Estab. ug/mL - 42.2(H) - - -  Micro/Creat Ratio  0.0 - 30.0 mg/g creat - 46.1(H) - - -  Chol <200 mg/dL 167 - 205(H) - -  HDL >50 mg/dL 78 - 74 - -  Calc LDL <100 mg/dL 79 - 116(H) - -  Triglycerides <150 mg/dL 51 - 77 - -  Creatinine 0.50 - 0.99 mg/dL 0.61 - - 0.49 0.40(L)   BP/Weight 10/04/2016 07/05/2016 07/01/2016 06/20/2016 06/19/2016 05/31/2016 1/53/7943  Systolic BP - 276 147 092 - 957 473  Diastolic BP - 80 80 74 - 60 77  Wt. (Lbs) 144 143 144 - 142 147 147  BMI 24.72 24.55 24.72 - 24.37 25.23 25.23   Foot/eye exam completion dates 05/24/2016 03/14/2015  Foot exam Order - -  Foot Form Completion Done Done   Deteriorated. Dose increase in insulin, needs diabetic ed, increase to 3 times daily testing and lifestyle change, will refer to endo once she calls for this     Otalgia, left Persistent left ear discomfort and bilateral cerumen impaction, refer to ENT  Hyperlipidemia LDL goal <100 Hyperlipidemia:Low fat diet discussed and encouraged.   Lipid Panel  Lab Results  Component Value Date   CHOL 167 09/30/2016   HDL 78 09/30/2016   LDLCALC 79 09/30/2016   TRIG 51 09/30/2016   CHOLHDL 2.1 09/30/2016   Controlled, no change in medication

## 2016-10-05 NOTE — Telephone Encounter (Signed)
Mailed

## 2016-10-11 ENCOUNTER — Other Ambulatory Visit: Payer: Self-pay

## 2016-10-11 ENCOUNTER — Telehealth: Payer: Self-pay | Admitting: *Deleted

## 2016-10-11 MED ORDER — GLUCOSE BLOOD VI STRP: ORAL_STRIP | 5 refills | Status: DC

## 2016-10-11 NOTE — Telephone Encounter (Signed)
Patient called stating she needs her strips refilled, patient also called requesting results. Please advise

## 2016-10-13 NOTE — Telephone Encounter (Signed)
Patient was able to get her strips and went over her lipid lab with her and mailed a copy

## 2016-10-28 ENCOUNTER — Encounter: Payer: Medicare Other | Attending: Family Medicine | Admitting: Nutrition

## 2016-10-28 VITALS — Ht 64.0 in | Wt 143.0 lb

## 2016-10-28 DIAGNOSIS — Z713 Dietary counseling and surveillance: Secondary | ICD-10-CM | POA: Insufficient documentation

## 2016-10-28 DIAGNOSIS — E119 Type 2 diabetes mellitus without complications: Secondary | ICD-10-CM | POA: Diagnosis not present

## 2016-10-28 DIAGNOSIS — E1165 Type 2 diabetes mellitus with hyperglycemia: Secondary | ICD-10-CM

## 2016-10-28 DIAGNOSIS — E118 Type 2 diabetes mellitus with unspecified complications: Secondary | ICD-10-CM

## 2016-10-28 DIAGNOSIS — IMO0002 Reserved for concepts with insufficient information to code with codable children: Secondary | ICD-10-CM

## 2016-10-28 DIAGNOSIS — Z794 Long term (current) use of insulin: Secondary | ICD-10-CM

## 2016-11-01 ENCOUNTER — Telehealth: Payer: Self-pay | Admitting: Family Medicine

## 2016-11-01 ENCOUNTER — Ambulatory Visit (INDEPENDENT_AMBULATORY_CARE_PROVIDER_SITE_OTHER): Payer: Medicare Other | Admitting: Otolaryngology

## 2016-11-01 DIAGNOSIS — H6123 Impacted cerumen, bilateral: Secondary | ICD-10-CM

## 2016-11-01 DIAGNOSIS — H900 Conductive hearing loss, bilateral: Secondary | ICD-10-CM | POA: Diagnosis not present

## 2016-11-01 NOTE — Telephone Encounter (Signed)
Patient went to see the dietician that she was referred to and the dietician on Thursday has suggested that she stop one of the medications (glipizide).  Please call and advise ASAP.  She will continue take until she hears from Dr. Moshe Cipro

## 2016-11-01 NOTE — Progress Notes (Signed)
Diabetes Self-Management Education  Visit Type: First/Initial  Appt. Start Time: 1100 Appt. End Time: 1230  11/01/2016  [name], identified by name and date of birth, is a 66 y.o. female with a diagnosis of Diabetes: Type 2. Has had DM for > 10 yrs. Lantus 22 units a day, Metformin 2 g/d, Glipizide 10 mg BID. She notes she has low blood sugars around 3 am often. Downloaded meter. Has BS in the 60's in middle of the night when she does check but sometimes just treats the symptoms and doesnt'check BS. She admits to overeating at meals due to possible low blood sugars before meals but she doesn't check BS then and just eats more food at times.  A1C 7.6%.   ASSESSMENT  Height 5\' 4"  (1.626 m), weight 143 lb (64.9 kg). Body mass index is 24.55 kg/m.      Diabetes Self-Management Education - 10/28/16 1122      Visit Information   Visit Type First/Initial     Initial Visit   Diabetes Type Type 2   Are you currently following a meal plan? No   Are you taking your medications as prescribed? Yes   Date Diagnosed [place]   How would you rate your overall health? Good     Psychosocial Assessment   Patient Belief/Attitude about Diabetes Motivated to manage diabetes   Self-care barriers None   Other persons present Patient   Patient Concerns Nutrition/Meal planning;Medication;Monitoring;Healthy Lifestyle;Problem Solving;Weight Control   Special Needs None   Preferred Learning Style No preference indicated;Auditory;Hands on   Learning Readiness Change in progress   How often do you need to have someone help you when you read instructions, pamphlets, or other written materials from your doctor or pharmacy? 1 - Never   What is the last grade level you completed in school? 12     Pre-Education Assessment   Patient understands the diabetes disease and treatment process. Needs Review   Patient understands incorporating nutritional management into lifestyle. Needs  Review   Patient undertands incorporating physical activity into lifestyle. Needs Review   Patient understands using medications safely. Needs Review   Patient understands monitoring blood glucose, interpreting and using results Needs Review   Patient understands prevention, detection, and treatment of acute complications. Needs Review   Patient understands prevention, detection, and treatment of chronic complications. Needs Review   Patient understands how to develop strategies to address psychosocial issues. Needs Review   Patient understands how to develop strategies to promote health/change behavior. Needs Review     Complications   Last HgB A1C per patient/outside source 7.6 %   How often do you check your blood sugar? 1-2 times/day   Fasting Blood glucose range (mg/dL) 130-179   Postprandial Blood glucose range (mg/dL) 180-200   Number of hypoglycemic episodes per month 2   Number of hyperglycemic episodes per week 1   Can you tell when your blood sugar is high? No   Have you had a dilated eye exam in the past 12 months? No   Have you had a dental exam in the past 12 months? No   Are you checking your feet? No     Dietary Intake   Breakfast skips or oatmeal   Lunch Spaghetti and toss salad,  Water   Snack (afternoon) carrots or nuts   Dinner Hamburger on bun and sweet potato fries and broccolli, water   Snack (evening) eats at 3 am if BS is  drop   Beverage(s) water     Exercise   Exercise Type Light (walking / raking leaves)   How many days per week to you exercise? 4   How many minutes per day do you exercise? 30   Total minutes per week of exercise 120     Patient Education   Disease state  Explored patient's options for treatment of their diabetes;Definition of diabetes, type 1 and 2, and the diagnosis of diabetes   Nutrition management  Role of diet in the treatment of diabetes and the relationship between the three main macronutrients and blood glucose level;Food label  reading, portion sizes and measuring food.;Carbohydrate counting;Meal timing in regards to the patients' current diabetes medication.;Information on hints to eating out and maintain blood glucose control.;Meal options for control of blood glucose level and chronic complications.   Physical activity and exercise  Role of exercise on diabetes management, blood pressure control and cardiac health.;Helped patient identify appropriate exercises in relation to his/her diabetes, diabetes complications and other health issue.   Medications Taught/reviewed insulin injection, site rotation, insulin storage and needle disposal.;Reviewed patients medication for diabetes, action, purpose, timing of dose and side effects.;Reviewed medication adjustment guidelines for hyperglycemia and sick days.   Monitoring Taught/evaluated SMBG meter.;Purpose and frequency of SMBG.;Interpreting lab values - A1C, lipid, urine microalbumina.;Taught/discussed recording of test results and interpretation of SMBG.;Identified appropriate SMBG and/or A1C goals.;Daily foot exams   Acute complications Taught treatment of hypoglycemia - the 15 rule.;Discussed and identified patients' treatment of hyperglycemia.   Chronic complications Relationship between chronic complications and blood glucose control;Assessed and discussed foot care and prevention of foot problems;Lipid levels, blood glucose control and heart disease;Identified and discussed with patient  current chronic complications;Retinopathy and reason for yearly dilated eye exams;Nephropathy, what it is, prevention of, the use of ACE, ARB's and early detection of through urine microalbumia.;Reviewed with patient heart disease, higher risk of, and prevention;Dental care   Psychosocial adjustment Role of stress on diabetes     Individualized Goals (developed by patient)   Nutrition Follow meal plan discussed;General guidelines for healthy choices and portions discussed;Adjust meds/carbs  with exercise as discussed   Physical Activity Exercise 3-5 times per week;30 minutes per day   Medications take my medication as prescribed   Monitoring  test my blood glucose as discussed;test blood glucose pre and post meals as discussed;test blood glucose before bed, 3 a.m., and fasting   Reducing Risk examine blood glucose patterns;get labs drawn;do foot checks daily;increase portions of olive oil in diet;increase portions of healthy fats     Post-Education Assessment   Patient understands the diabetes disease and treatment process. Needs Review   Patient understands incorporating nutritional management into lifestyle. Needs Review   Patient undertands incorporating physical activity into lifestyle. Needs Review   Patient understands using medications safely. Needs Review   Patient understands monitoring blood glucose, interpreting and using results Needs Review   Patient understands prevention, detection, and treatment of acute complications. Needs Review   Patient understands prevention, detection, and treatment of chronic complications. Needs Review   Patient understands how to develop strategies to address psychosocial issues. Needs Review   Patient understands how to develop strategies to promote health/change behavior. Needs Review     Outcomes   Expected Outcomes Demonstrated interest in learning. Expect positive outcomes   Future DMSE 4-6 wks   Program Status Completed      Individualized Plan for Diabetes Self-Management Training:   Learning Objective:  Patient will have a  greater understanding of diabetes self-management. Patient education plan is to attend individual and/or group sessions per assessed needs and concerns.   Plan:   Patient Instructions  Goals 1. Follow My Plate 2. Eat 2-3 carb choices per meals 3. Eat meals on time 4. Treat low blood sugar with 15 grams simple carbohydrate, and retest in 15 mintues. Repeat if needed. Goals BS > 70 mg/dl. 5. Talk  with Dr. Moshe Cipro about low blood sugars in middle of night and see if she wants to stop Glipizide. 6. Increase fresh fruits and vegetables. Prevent low blood sugars. Keep juice, or glucose tablets at bedside for emergency of low blood sugars. Walk 30 minutes a day. Test blood sugars twice a day; in am before breakfast and before bedtime or symptoms of hyper/hypoglycemia. Take meds as prescribed. Drink more water.   Expected Outcomes:  Demonstrated interest in learning. Expect positive outcomes  Education material provided: Living Well with Diabetes, Food label handouts, A1C conversion sheet, Meal plan card, My Plate and Carbohydrate counting sheet  If problems or questions, patient to contact team via:  Phone and Email   Future DSME appointment: 4-6 wks  Discuss low blood sugars readings and Glipizide with Dr. Moshe Cipro

## 2016-11-01 NOTE — Patient Instructions (Signed)
Goals 1. Follow My Plate 2. Eat 2-3 carb choices per meals 3. Eat meals on time 4. Treat low blood sugar with 15 grams simple carbohydrate, and retest in 15 mintues. Repeat if needed. Goals BS > 70 mg/dl. 5. Talk with Dr. Moshe Cipro about low blood sugars in middle of night and see if she wants to stop Glipizide. 6. Increase fresh fruits and vegetables. Prevent low blood sugars. Keep juice, or glucose tablets at bedside for emergency of low blood sugars. Walk 30 minutes a day. Test blood sugars twice a day; in am before breakfast and before bedtime or symptoms of hyper/hypoglycemia. Take meds as prescribed. Drink more water.

## 2016-11-02 NOTE — Telephone Encounter (Signed)
Pls let her know she misunderstood, she needs to stay on the glipizide and eat on a regular schedule same carb load at each meal so less chance of having sugar lows and bottoming out

## 2016-11-02 NOTE — Telephone Encounter (Signed)
The bedtime glipizide dose can also be reduced to 5 mg tablet since waking up with a blood sugar of 60  I will send that in if she wishes

## 2016-11-02 NOTE — Telephone Encounter (Signed)
States she has been waking up frequently around 3am with glucose around 60 and has to eat to bring it up and that results in high glucose in the am. She sounded very concerned and believes the glipizide is not helping like it was before and its a different texture and very chalky. Advised her to speak with the pharmacy about her rx pill concern

## 2016-11-02 NOTE — Telephone Encounter (Signed)
Patient aware and will do as directed

## 2016-11-18 ENCOUNTER — Other Ambulatory Visit: Payer: Self-pay | Admitting: Family Medicine

## 2016-11-18 NOTE — Telephone Encounter (Signed)
Seen 7 30 18

## 2016-11-29 ENCOUNTER — Encounter: Payer: Medicare Other | Attending: Family Medicine | Admitting: Nutrition

## 2016-11-29 VITALS — Wt 146.0 lb

## 2016-11-29 DIAGNOSIS — E118 Type 2 diabetes mellitus with unspecified complications: Secondary | ICD-10-CM

## 2016-11-29 DIAGNOSIS — Z713 Dietary counseling and surveillance: Secondary | ICD-10-CM | POA: Diagnosis not present

## 2016-11-29 DIAGNOSIS — E119 Type 2 diabetes mellitus without complications: Secondary | ICD-10-CM | POA: Diagnosis not present

## 2016-11-29 DIAGNOSIS — IMO0002 Reserved for concepts with insufficient information to code with codable children: Secondary | ICD-10-CM

## 2016-11-29 DIAGNOSIS — E1165 Type 2 diabetes mellitus with hyperglycemia: Secondary | ICD-10-CM

## 2016-11-29 NOTE — Patient Instructions (Addendum)
Goals 1. Increase more vegetables with lunch and dinner 2. Measure foods out for portion control. 3. Keep drinking water 4. Take meds with meals. Eat 2-3 carb choices per meal Walk 30 minutes a day.

## 2016-11-29 NOTE — Progress Notes (Signed)
Diabetes Self-Management Education  Visit Type:  Follow-up  Appt. Start Time: 11 Appt. End Time: 1130  12/01/2016  [name], identified by name and date of birth, is a 66 y.o. female with a diagnosis of Diabetes:  .  Follow up. Working on better balanced meals, testing blood sugars better and eating meals on time.  ASSESSMENT Lab Results  Component Value Date   HGBA1C 7.6 (H) 09/30/2016    Weight 146 lb (66.2 kg). Body mass index is 25.06 kg/m.       Diabetes Self-Management Education - 11/29/16 1104      Dietary Intake   Breakfast somtimes skips; leftovers; or bacon and cheese toast or fruit and eggs,   Lunch Pork chop sandwich, baked potato-small and greens, water, coffee   Dinner bowl of chili and 8-10, water   Snack (evening) bevilta 2-3, water   Beverage(s) water     Exercise   Exercise Type Light (walking / raking leaves)   How many days per week to you exercise? 15   How many minutes per day do you exercise? 5   Total minutes per week of exercise 75      Learning Objective:  Patient will have a greater understanding of diabetes self-management. Patient education plan is to attend individual and/or group sessions per assessed needs and concerns.   Plan:   Patient Instructions  Goals 1. Increase more vegetables with lunch and dinner 2. Measure foods out for portion control. 3. Keep drinking water 4. Take meds with meals. Eat 2-3 carb choices per meal Walk 30 minutes a day.    Expected Outcomes:     Education material provided: My Plate and Carbohydrate counting sheet  If problems or questions, patient to contact team via:  Phone and Email  Future DSME appointment: -    3 months

## 2016-12-23 DIAGNOSIS — E119 Type 2 diabetes mellitus without complications: Secondary | ICD-10-CM | POA: Diagnosis not present

## 2016-12-23 DIAGNOSIS — E559 Vitamin D deficiency, unspecified: Secondary | ICD-10-CM | POA: Diagnosis not present

## 2016-12-24 LAB — COMPLETE METABOLIC PANEL WITH GFR
AG Ratio: 1.8 (calc) (ref 1.0–2.5)
ALT: 14 U/L (ref 6–29)
AST: 12 U/L (ref 10–35)
Albumin: 4.5 g/dL (ref 3.6–5.1)
Alkaline phosphatase (APISO): 95 U/L (ref 33–130)
BUN: 15 mg/dL (ref 7–25)
CO2: 28 mmol/L (ref 20–32)
Calcium: 9.3 mg/dL (ref 8.6–10.4)
Chloride: 103 mmol/L (ref 98–110)
Creat: 0.58 mg/dL (ref 0.50–0.99)
GFR, Est African American: 111 mL/min/{1.73_m2} (ref >=60)
GFR, Est Non African American: 95 mL/min/{1.73_m2} (ref >=60)
Globulin: 2.5 g/dL (calc) (ref 1.9–3.7)
Glucose, Bld: 87 mg/dL (ref 65–99)
Potassium: 3.8 mmol/L (ref 3.5–5.3)
Sodium: 141 mmol/L (ref 135–146)
Total Bilirubin: 0.5 mg/dL (ref 0.2–1.2)
Total Protein: 7 g/dL (ref 6.1–8.1)

## 2016-12-24 LAB — VITAMIN D 25 HYDROXY (VIT D DEFICIENCY, FRACTURES): Vit D, 25-Hydroxy: 13 ng/mL — ABNORMAL LOW (ref 30–100)

## 2016-12-24 LAB — HEMOGLOBIN A1C
Hgb A1c MFr Bld: 7.4 % of total Hgb — ABNORMAL HIGH (ref <5.7)
Mean Plasma Glucose: 166 (calc)
eAG (mmol/L): 9.2 (calc)

## 2016-12-27 ENCOUNTER — Encounter: Payer: Self-pay | Admitting: Family Medicine

## 2016-12-27 ENCOUNTER — Ambulatory Visit (INDEPENDENT_AMBULATORY_CARE_PROVIDER_SITE_OTHER): Payer: Medicare Other

## 2016-12-27 VITALS — BP 128/80 | HR 84 | Temp 97.9°F | Ht 64.0 in | Wt 146.1 lb

## 2016-12-27 DIAGNOSIS — E119 Type 2 diabetes mellitus without complications: Secondary | ICD-10-CM | POA: Diagnosis not present

## 2016-12-27 DIAGNOSIS — Z23 Encounter for immunization: Secondary | ICD-10-CM

## 2016-12-27 DIAGNOSIS — Z0000 Encounter for general adult medical examination without abnormal findings: Secondary | ICD-10-CM

## 2016-12-27 NOTE — Progress Notes (Signed)
Subjective:   [name] is a 65 y.o. female who presents for Medicare Annual (Subsequent) preventive examination.  Review of Systems:  Cardiac Risk Factors include: advanced age (>31men, >75 women);diabetes mellitus;dyslipidemia;hypertension     Objective:     Vitals: BP 128/80   Pulse 84   Temp 97.9 F (36.6 C) (Temporal)   Ht 5\' 4"  (1.626 m)   Wt 146 lb 1.9 oz (66.3 kg)   BMI 25.08 kg/m   Body mass index is 25.08 kg/m.   Tobacco History  Smoking Status  . Never Smoker  Smokeless Tobacco  . Never Used     Counseling given: Not Answered   Past Medical History:  Diagnosis Date  . Essential hypertension   . Headache(784.0)   . Hyperlipidemia   . Type 2 diabetes mellitus ([place])   . Vaginitis    Past Surgical History:  Procedure Laterality Date  . NEPHROLITHOTOMY Right 05/31/2016   Procedure: NEPHROLITHOTOMY PERCUTANEOUS WITH SURGEON ACCESS;  Surgeon: Cleon Gustin, MD;  Location: [facility];  Service: Urology;  Laterality: Right;  . TOTAL ABDOMINAL HYSTERECTOMY W/ BILATERAL SALPINGOOPHORECTOMY  1998   Family History  Problem Relation Age of Onset  . Alcohol abuse Mother   . Esophageal cancer Father   . Alcohol abuse Father   . Diabetes Sister   . Diabetes Sister   . Hypertension Sister   . Heart attack Sister        in 48's  . Seizures Brother    History  Sexual Activity  . Sexual activity: No    Outpatient Encounter Prescriptions as of 12/27/2016  Medication Sig  . amLODipine (NORVASC) 5 MG tablet TAKE 1 TABLET BY MOUTH EVERY DAY  . aspirin EC 81 MG tablet Take 1 tablet (81 mg total) by mouth daily.  Marland Kitchen atorvastatin (LIPITOR) 10 MG tablet Take 1 tablet (10 mg total) by mouth daily.  Marland Kitchen glipiZIDE (GLUCOTROL) 10 MG tablet TAKE 1 TABLET BY MOUTH TWICE A DAY BEFORE MEALS  . glucose blood (ONE TOUCH ULTRA TEST) test strip TEST 3 TIMES A DAY AS DIRECTED dx e11.65  . Insulin Glargine (LANTUS SOLOSTAR) 100 UNIT/ML Solostar Pen Inject 22 Units into  the skin daily at 10 pm.  . meloxicam (MOBIC) 15 MG tablet Take 1 tablet (15 mg total) by mouth daily. (Patient taking differently: Take 15 mg by mouth daily as needed. )  . metFORMIN (GLUCOPHAGE) 1000 MG tablet TAKE 1 TABLET BY MOUTH TWICE A DAY WITH FOOD   No facility-administered encounter medications on file as of 12/27/2016.     Activities of Daily Living In your present state of health, do you have any difficulty performing the following activities: 12/27/2016 06/19/2016  Hearing? N N  Vision? Y N  Comment Recommend follow up with Eye Dr.  Marland Kitchen  Difficulty concentrating or making decisions? N N  Walking or climbing stairs? N N  Dressing or bathing? N N  Doing errands, shopping? N N  Preparing Food and eating ? N -  Using the Toilet? N -  In the past six months, have you accidently leaked urine? N -  Do you have problems with loss of bowel control? N -  Managing your Medications? N -  Managing your Finances? N -  Housekeeping or managing your Housekeeping? N -  Some recent data might be hidden    Patient Care Team: Fayrene Helper, MD as PCP - General Williams Che, MD (Inactive) (Ophthalmology)    Assessment:  Exercise Activities and Dietary recommendations Current Exercise Habits: Home exercise routine, Type of exercise: stretching, Time (Minutes): 10, Frequency (Times/Week): 3, Weekly Exercise (Minutes/Week): 30, Intensity: Mild, Exercise limited by: None identified  Goals    . Eat more fruits and vegetables    . Exercise 3x per week (30 min per time)      Fall Risk Fall Risk  12/27/2016 10/04/2016 04/29/2016 02/24/2016 01/22/2016  Falls in the past year? No No No No No  Risk for fall due to : - - - Impaired vision -   Depression Screen PHQ 2/9 Scores 12/27/2016 10/04/2016 04/29/2016 02/24/2016  PHQ - 2 Score 0 0 0 0  PHQ- 9 Score - - - -     Cognitive Function: Normal   6CIT Screen 12/27/2016 02/24/2016  What Year? 0 points 0 points  What month? 0  points 0 points  What time? 0 points 0 points  Count back from 20 0 points 0 points  Months in reverse 0 points 0 points  Repeat phrase 0 points 0 points  Total Score 0 0    Immunization History  Administered Date(s) Administered  . Influenza Split 04/24/2012  . Influenza,inj,Quad PF,6+ Mos 01/25/2014, 03/14/2015, 05/24/2016, 12/27/2016  . Pneumococcal Conjugate-13 05/24/2016  . Tdap 04/24/2012  . Zoster 09/13/2013   Screening Tests Health Maintenance  Topic Date Due  . OPHTHALMOLOGY EXAM  05/10/1960  . FOOT EXAM  05/24/2017  . PNA vac Low Risk Adult (2 of 2 - PPSV23) 05/24/2017  . HEMOGLOBIN A1C  06/23/2017  . URINE MICROALBUMIN  07/01/2017  . COLONOSCOPY  09/18/2017  . MAMMOGRAM  07/14/2018  . TETANUS/TDAP  04/24/2022  . INFLUENZA VACCINE  Completed  . DEXA SCAN  Completed  . Hepatitis C Screening  Completed      Plan:   I have personally reviewed and noted the following in the patient's chart:   . Medical and social history . Use of alcohol, tobacco or illicit drugs  . Current medications and supplements . Functional ability and status . Nutritional status . Physical activity . Advanced directives . List of other physicians . Hospitalizations, surgeries, and ER visits in previous 12 months . Vitals . Screenings to include cognitive, depression, and falls . Referrals and appointments  In addition, I have reviewed and discussed with patient certain preventive protocols, quality metrics, and best practice recommendations. A written personalized care plan for preventive services as well as general preventive health recommendations were provided to patient.     Stormy Fabian, LPN  [id number]

## 2016-12-27 NOTE — Patient Instructions (Addendum)
[name] , Thank you for taking time to come for your Medicare Wellness Visit. I appreciate your ongoing commitment to your health goals. Please review the following plan we discussed and let me know if I can assist you in the future.   Screening recommendations/referrals: Colonoscopy: Up to date, next due 09/2017 Mammogram: Up to date, next due 07/2017 Bone Density: Up to date Diabetic Eye Exam: Due, referral sent to Dr. Jorja Loa at [facility] [address] Recommended yearly dental visit for hygiene and checkup  Vaccinations: Influenza vaccine: Administered today Pneumococcal vaccine: Due for Pneumovax 23 05/2017 Tdap vaccine: Up to date, next due 04/2022 Shingles vaccine: Done    Advanced directives: Advance directive discussed with you today. I have provided a copy for you to complete at home and have notarized. Once this is complete please bring a copy in to our office so we can scan it into your chart.  Conditions/risks identified: Pre-obese, recommend starting a routine exercise program at least 3 days a week for 30-45 minutes at a time as tolerated.   Next appointment: Follow up with Dr. Moshe Cipro on 02/03/2017 at 8:40 am. Follow up in 1 year for your annual wellness visit.   Preventive Care 13 Years and Older, Female Preventive care refers to lifestyle choices and visits with your health care provider that can promote health and wellness. What does preventive care include?  A yearly physical exam. This is also called an annual well check.  Dental exams once or twice a year.  Routine eye exams. Ask your health care provider how often you should have your eyes checked.  Personal lifestyle choices, including:  Daily care of your teeth and gums.  Regular physical activity.  Eating a healthy diet.  Avoiding tobacco and drug use.  Limiting alcohol use.  Practicing safe sex.  Taking low-dose aspirin every day.  Taking vitamin and mineral supplements as recommended by your health care  provider. What happens during an annual well check? The services and screenings done by your health care provider during your annual well check will depend on your age, overall health, lifestyle risk factors, and family history of disease. Counseling  Your health care provider may ask you questions about your:  Alcohol use.  Tobacco use.  Drug use.  Emotional well-being.  Home and relationship well-being.  Sexual activity.  Eating habits.  History of falls.  Memory and ability to understand (cognition).  Work and work Statistician.  Reproductive health. Screening  You may have the following tests or measurements:  Height, weight, and BMI.  Blood pressure.  Lipid and cholesterol levels. These may be checked every 5 years, or more frequently if you are over 78 years old.  Skin check.  Lung cancer screening. You may have this screening every year starting at age 69 if you have a 30-pack-year history of smoking and currently smoke or have quit within the past 15 years.  Fecal occult blood test (FOBT) of the stool. You may have this test every year starting at age 97.  Flexible sigmoidoscopy or colonoscopy. You may have a sigmoidoscopy every 5 years or a colonoscopy every 10 years starting at age 53.  Hepatitis C blood test.  Hepatitis B blood test.  Sexually transmitted disease (STD) testing.  Diabetes screening. This is done by checking your blood sugar (glucose) after you have not eaten for a while (fasting). You may have this done every 1-3 years.  Bone density scan. This is done to screen for osteoporosis. You may  have this done starting at age 5.  Mammogram. This may be done every 1-2 years. Talk to your health care provider about how often you should have regular mammograms. Talk with your health care provider about your test results, treatment options, and if necessary, the need for more tests. Vaccines  Your health care provider may recommend certain  vaccines, such as:  Influenza vaccine. This is recommended every year.  Tetanus, diphtheria, and acellular pertussis (Tdap, Td) vaccine. You may need a Td booster every 10 years.  Zoster vaccine. You may need this after age 22.  Pneumococcal 13-valent conjugate (PCV13) vaccine. One dose is recommended after age 7.  Pneumococcal polysaccharide (PPSV23) vaccine. One dose is recommended after age 60. Talk to your health care provider about which screenings and vaccines you need and how often you need them. This information is not intended to replace advice given to you by your health care provider. Make sure you discuss any questions you have with your health care provider. Document Released: 03/21/2015 Document Revised: 11/12/2015 Document Reviewed: 12/24/2014 Elsevier Interactive Patient Education  2017 [place] Prevention in the Home Falls can cause injuries. They can happen to people of all ages. There are many things you can do to make your home safe and to help prevent falls. What can I do on the outside of my home?  Regularly fix the edges of walkways and driveways and fix any cracks.  Remove anything that might make you trip as you walk through a door, such as a raised step or threshold.  Trim any bushes or trees on the path to your home.  Use bright outdoor lighting.  Clear any walking paths of anything that might make someone trip, such as rocks or tools.  Regularly check to see if handrails are loose or broken. Make sure that both sides of any steps have handrails.  Any raised decks and porches should have guardrails on the edges.  Have any leaves, snow, or ice cleared regularly.  Use sand or salt on walking paths during winter.  Clean up any spills in your garage right away. This includes oil or grease spills. What can I do in the bathroom?  Use night lights.  Install grab bars by the toilet and in the tub and shower. Do not use towel bars as grab  bars.  Use non-skid mats or decals in the tub or shower.  If you need to sit down in the shower, use a plastic, non-slip stool.  Keep the floor dry. Clean up any water that spills on the floor as soon as it happens.  Remove soap buildup in the tub or shower regularly.  Attach bath mats securely with double-sided non-slip rug tape.  Do not have throw rugs and other things on the floor that can make you trip. What can I do in the bedroom?  Use night lights.  Make sure that you have a light by your bed that is easy to reach.  Do not use any sheets or blankets that are too big for your bed. They should not hang down onto the floor.  Have a firm chair that has side arms. You can use this for support while you get dressed.  Do not have throw rugs and other things on the floor that can make you trip. What can I do in the kitchen?  Clean up any spills right away.  Avoid walking on wet floors.  Keep items that you use a lot in  easy-to-reach places.  If you need to reach something above you, use a strong step stool that has a grab bar.  Keep electrical cords out of the way.  Do not use floor polish or wax that makes floors slippery. If you must use wax, use non-skid floor wax.  Do not have throw rugs and other things on the floor that can make you trip. What can I do with my stairs?  Do not leave any items on the stairs.  Make sure that there are handrails on both sides of the stairs and use them. Fix handrails that are broken or loose. Make sure that handrails are as long as the stairways.  Check any carpeting to make sure that it is firmly attached to the stairs. Fix any carpet that is loose or worn.  Avoid having throw rugs at the top or bottom of the stairs. If you do have throw rugs, attach them to the floor with carpet tape.  Make sure that you have a light switch at the top of the stairs and the bottom of the stairs. If you do not have them, ask someone to add them for  you. What else can I do to help prevent falls?  Wear shoes that:  Do not have high heels.  Have rubber bottoms.  Are comfortable and fit you well.  Are closed at the toe. Do not wear sandals.  If you use a stepladder:  Make sure that it is fully opened. Do not climb a closed stepladder.  Make sure that both sides of the stepladder are locked into place.  Ask someone to hold it for you, if possible.  Clearly mark and make sure that you can see:  Any grab bars or handrails.  First and last steps.  Where the edge of each step is.  Use tools that help you move around (mobility aids) if they are needed. These include:  Canes.  Walkers.  Scooters.  Crutches.  Turn on the lights when you go into a dark area. Replace any light bulbs as soon as they burn out.  Set up your furniture so you have a clear path. Avoid moving your furniture around.  If any of your floors are uneven, fix them.  If there are any pets around you, be aware of where they are.  Review your medicines with your doctor. Some medicines can make you feel dizzy. This can increase your chance of falling. Ask your doctor what other things that you can do to help prevent falls. This information is not intended to replace advice given to you by your health care provider. Make sure you discuss any questions you have with your health care provider. Document Released: 12/19/2008 Document Revised: 07/31/2015 Document Reviewed: 03/29/2014 Elsevier Interactive Patient Education  2017 [employer].

## 2016-12-28 NOTE — Addendum Note (Signed)
Addended by: Stormy Fabian D on: 12/28/2016 12:51 PM   Modules accepted: Miquel Dunn

## 2016-12-30 DIAGNOSIS — N3001 Acute cystitis with hematuria: Secondary | ICD-10-CM | POA: Diagnosis not present

## 2016-12-30 DIAGNOSIS — N202 Calculus of kidney with calculus of ureter: Secondary | ICD-10-CM | POA: Diagnosis not present

## 2017-01-11 ENCOUNTER — Other Ambulatory Visit: Payer: Self-pay | Admitting: Family Medicine

## 2017-01-11 DIAGNOSIS — E785 Hyperlipidemia, unspecified: Secondary | ICD-10-CM

## 2017-01-12 ENCOUNTER — Other Ambulatory Visit (HOSPITAL_COMMUNITY)
Admission: AD | Admit: 2017-01-12 | Discharge: 2017-01-12 | Disposition: A | Discharge status: Home / Self Care | Payer: Medicare Other | Source: Skilled Nursing Facility | Attending: Urology | Admitting: Urology

## 2017-01-12 DIAGNOSIS — N390 Urinary tract infection, site not specified: Secondary | ICD-10-CM | POA: Diagnosis not present

## 2017-01-12 LAB — URINALYSIS, ROUTINE W REFLEX MICROSCOPIC
Bilirubin Urine: NEGATIVE
Glucose, UA: NEGATIVE mg/dL
Ketones, ur: NEGATIVE mg/dL
Nitrite: POSITIVE — AB
Protein, ur: NEGATIVE mg/dL
Specific Gravity, Urine: 1.017 (ref 1.005–1.030)
pH: 5 (ref 5.0–8.0)

## 2017-01-15 LAB — URINE CULTURE: Culture: >=100000 — AB

## 2017-01-21 DIAGNOSIS — E119 Type 2 diabetes mellitus without complications: Secondary | ICD-10-CM | POA: Diagnosis not present

## 2017-01-21 DIAGNOSIS — E113293 Type 2 diabetes mellitus with mild nonproliferative diabetic retinopathy without macular edema, bilateral: Secondary | ICD-10-CM | POA: Diagnosis not present

## 2017-01-21 LAB — HM DIABETES EYE EXAM

## 2017-01-31 ENCOUNTER — Ambulatory Visit: Payer: Medicare Other

## 2017-02-03 ENCOUNTER — Ambulatory Visit: Payer: Medicare Other | Admitting: Family Medicine

## 2017-02-03 ENCOUNTER — Encounter: Payer: Self-pay | Admitting: Family Medicine

## 2017-02-03 VITALS — BP 130/80 | HR 88 | Resp 16 | Ht 64.0 in | Wt 145.0 lb

## 2017-02-03 DIAGNOSIS — E119 Type 2 diabetes mellitus without complications: Secondary | ICD-10-CM

## 2017-02-03 DIAGNOSIS — L723 Sebaceous cyst: Secondary | ICD-10-CM

## 2017-02-03 DIAGNOSIS — E785 Hyperlipidemia, unspecified: Secondary | ICD-10-CM

## 2017-02-03 DIAGNOSIS — IMO0001 Reserved for inherently not codable concepts without codable children: Secondary | ICD-10-CM

## 2017-02-03 DIAGNOSIS — Z794 Long term (current) use of insulin: Secondary | ICD-10-CM | POA: Diagnosis not present

## 2017-02-03 DIAGNOSIS — I10 Essential (primary) hypertension: Secondary | ICD-10-CM

## 2017-02-03 NOTE — Patient Instructions (Addendum)
F/u in early April, call if you need me before  Fasting lipid, cmp and EGFR, HBA1C 1 week before  next visit  Please start OTC vitamin D3 5000 IU once daily  Call for referral to Dr Arnoldo Morale for cyst on abdomen  When you are ready for the consultation please  It is important that you exercise regularly at least 30 minutes 5 times a week. If you develop chest pain, have severe difficulty breathing, or feel very tired, stop exercising immediately and seek medical attention   Good exam and labs  Thank you  for choosing [facility]. We consider it a privelige to serve you.  Delivering excellent health care in a caring and  compassionate way is our goal.  Partnering with you,  so that together we can achieve this goal is our strategy.

## 2017-02-03 NOTE — Assessment & Plan Note (Signed)
Annual exam as documented. Counseling done  re healthy lifestyle involving commitment to 150 minutes exercise per week, heart healthy diet, and attaining healthy weight.The importance of adequate sleep also discussed.  Immunization and cancer screening needs are specifically addressed at this visit.  

## 2017-02-09 ENCOUNTER — Ambulatory Visit: Payer: Medicare Other | Admitting: Nutrition

## 2017-02-14 ENCOUNTER — Encounter: Payer: Self-pay | Admitting: Family Medicine

## 2017-02-14 NOTE — Assessment & Plan Note (Signed)
Controlled, no change in medication DASH diet and commitment to daily physical activity for a minimum of 30 minutes discussed and encouraged, as a part of hypertension management. The importance of attaining a healthy weight is also discussed.  BP/Weight 02/03/2017 12/27/2016 11/29/2016 10/28/2016 10/04/2016 07/05/2016 04/25/2881  Systolic BP 374 451 - - - 460 479  Diastolic BP 80 80 - - - 80 80  Wt. (Lbs) 145 146.12 146 143 144 143 144  BMI 24.89 25.08 25.06 24.55 24.72 24.55 24.72

## 2017-02-14 NOTE — Assessment & Plan Note (Signed)
Hyperlipidemia:Low fat diet discussed and encouraged.   Lipid Panel  Lab Results  Component Value Date   CHOL 167 09/30/2016   HDL 78 09/30/2016   LDLCALC 79 09/30/2016   TRIG 51 09/30/2016   CHOLHDL 2.1 09/30/2016   Updated lab needed at/ before next visit. Controlled, no change in medication

## 2017-02-14 NOTE — Assessment & Plan Note (Signed)
[name] is reminded of the importance of commitment to daily physical activity for 30 minutes or more, as able and the need to limit carbohydrate intake to 30 to 60 grams per meal to help with blood sugar control.   The need to take medication as prescribed, test blood sugar as directed, and to call between visits if there is a concern that blood sugar is uncontrolled is also discussed.   [name] is reminded of the importance of daily foot exam, annual eye examination, and good blood sugar, blood pressure and cholesterol control.  Diabetic Labs Latest Ref Rng & Units 12/23/2016 09/30/2016 07/01/2016 06/20/2016 06/19/2016  HbA1c <5.7 % of total Hgb 7.4(H) 7.6(H) - 7.3(H) -  Microalbumin Not Estab. ug/mL - - 42.2(H) - -  Micro/Creat Ratio 0.0 - 30.0 mg/g creat - - 46.1(H) - -  Chol <200 mg/dL - 167 - 205(H) -  HDL >50 mg/dL - 78 - 74 -  Calc LDL <100 mg/dL - 79 - 116(H) -  Triglycerides <150 mg/dL - 51 - 77 -  Creatinine 0.50 - 0.99 mg/dL 0.58 0.61 - - 0.49   BP/Weight 02/03/2017 12/27/2016 11/29/2016 10/28/2016 10/04/2016 07/05/2016 4/68/0321  Systolic BP 224 825 - - - 003 704  Diastolic BP 80 80 - - - 80 80  Wt. (Lbs) 145 146.12 146 143 144 143 144  BMI 24.89 25.08 25.06 24.55 24.72 24.55 24.72   Foot/eye exam completion dates Latest Ref Rng & Units 01/21/2017 05/24/2016  Eye Exam No Retinopathy Retinopathy(A) -  Foot exam Order - - -  Foot Form Completion - - Done   Improved, no change in medication management

## 2017-02-14 NOTE — Progress Notes (Signed)
[name]     MRN: [id number]      DOB: 10-07-1950   HPI [name] is here for follow up and re-evaluation of chronic medical conditions, medication management and review of any available recent lab and radiology data.  Preventive health is updated, specifically  Cancer screening and Immunization.   Questions or concerns regarding consultations or procedures which the PT has had in the interim are  addressed. The PT denies any adverse reactions to current medications since the last visit.  C/o enlarging cyst ( sebaceous) on her mid upper abdomen , which she would like removed at some time Denies polyuria, polydipsia, blurred vision , or hypoglycemic episodes.States she has benefited a lot from the diabetic education sessions and intends to continue  ROS Denies recent fever or chills. Denies sinus pressure, nasal congestion, ear pain or sore throat. Denies chest congestion, productive cough or wheezing. Denies chest pains, palpitations and leg swelling Denies abdominal pain, nausea, vomiting,diarrhea or constipation.   Denies dysuria, frequency, hesitancy or incontinence. Denies joint pain, swelling and limitation in mobility. Denies headaches, seizures, numbness, or tingling. Denies depression, anxiety or insomnia. Denies skin break down or rash.   PE  BP 130/80   Pulse 88   Resp 16   Ht 5\' 4"  (1.626 m)   Wt 145 lb (65.8 kg)   SpO2 96%   BMI 24.89 kg/m   Patient alert and oriented and in no cardiopulmonary distress.  HEENT: No facial asymmetry, EOMI,   oropharynx pink and moist.  Neck supple no JVD, no mass.  Chest: Clear to auscultation bilaterally.  CVS: S1, S2 no murmurs, no S3.Regular rate.  ABD: Soft non tender. Sebaceous cyst on anterior abdomen  Ext: No edema  MS: Adequate ROM spine, shoulders, hips and knees.  Skin: Intact, no ulcerations or rash noted.  Psych: Good eye contact, normal affect. Memory intact not anxious or depressed  appearing.  CNS: CN 2-12 intact, power,  normal throughout.no focal deficits noted.   Assessment & Plan  Annual physical exam Annual exam as documented. Counseling done  re healthy lifestyle involving commitment to 150 minutes exercise per week, heart healthy diet, and attaining healthy weight.The importance of adequate sleep also discussed. Immunization and cancer screening needs are specifically addressed at this visit.   Essential hypertension Controlled, no change in medication DASH diet and commitment to daily physical activity for a minimum of 30 minutes discussed and encouraged, as a part of hypertension management. The importance of attaining a healthy weight is also discussed.  BP/Weight 02/03/2017 12/27/2016 11/29/2016 10/28/2016 10/04/2016 07/05/2016 09/04/5282  Systolic BP 132 440 - - - 102 725  Diastolic BP 80 80 - - - 80 80  Wt. (Lbs) 145 146.12 146 143 144 143 144  BMI 24.89 25.08 25.06 24.55 24.72 24.55 24.72       DM type 2 (diabetes mellitus, type 2) ([place]) [name] is reminded of the importance of commitment to daily physical activity for 30 minutes or more, as able and the need to limit carbohydrate intake to 30 to 60 grams per meal to help with blood sugar control.   The need to take medication as prescribed, test blood sugar as directed, and to call between visits if there is a concern that blood sugar is uncontrolled is also discussed.   [name] is reminded of the importance of daily foot exam, annual eye examination, and good blood sugar, blood pressure and cholesterol control.  Diabetic Labs Latest Ref Rng &  Units 12/23/2016 09/30/2016 07/01/2016 06/20/2016 06/19/2016  HbA1c <5.7 % of total Hgb 7.4(H) 7.6(H) - 7.3(H) -  Microalbumin Not Estab. ug/mL - - 42.2(H) - -  Micro/Creat Ratio 0.0 - 30.0 mg/g creat - - 46.1(H) - -  Chol <200 mg/dL - 167 - 205(H) -  HDL >50 mg/dL - 78 - 74 -  Calc LDL <100 mg/dL - 79 - 116(H) -  Triglycerides <150 mg/dL - 51 - 77 -   Creatinine 0.50 - 0.99 mg/dL 0.58 0.61 - - 0.49   BP/Weight 02/03/2017 12/27/2016 11/29/2016 10/28/2016 10/04/2016 07/05/2016 07/24/8414  Systolic BP 606 301 - - - 601 093  Diastolic BP 80 80 - - - 80 80  Wt. (Lbs) 145 146.12 146 143 144 143 144  BMI 24.89 25.08 25.06 24.55 24.72 24.55 24.72   Foot/eye exam completion dates Latest Ref Rng & Units 01/21/2017 05/24/2016  Eye Exam No Retinopathy Retinopathy(A) -  Foot exam Order - - -  Foot Form Completion - - Done   Improved, no change in medication management     Hyperlipidemia LDL goal <100 Hyperlipidemia:Low fat diet discussed and encouraged.   Lipid Panel  Lab Results  Component Value Date   CHOL 167 09/30/2016   HDL 78 09/30/2016   LDLCALC 79 09/30/2016   TRIG 51 09/30/2016   CHOLHDL 2.1 09/30/2016   Updated lab needed at/ before next visit. Controlled, no change in medication     Sebaceous cyst Large cyst on anterior abdominal wall in epigastric rea, patient will call when ready to have this removed

## 2017-02-14 NOTE — Assessment & Plan Note (Signed)
Large cyst on anterior abdominal wall in epigastric rea, patient will call when ready to have this removed

## 2017-02-23 ENCOUNTER — Other Ambulatory Visit: Payer: Self-pay | Admitting: Family Medicine

## 2017-02-23 NOTE — Telephone Encounter (Signed)
Seen 11 29 18

## 2017-04-05 ENCOUNTER — Ambulatory Visit: Payer: Medicare Other | Admitting: Nutrition

## 2017-04-05 DIAGNOSIS — L720 Epidermal cyst: Secondary | ICD-10-CM | POA: Diagnosis not present

## 2017-04-26 ENCOUNTER — Telehealth: Payer: Self-pay | Admitting: Family Medicine

## 2017-04-26 ENCOUNTER — Other Ambulatory Visit: Payer: Self-pay | Admitting: Family Medicine

## 2017-04-26 ENCOUNTER — Other Ambulatory Visit: Payer: Self-pay

## 2017-04-26 MED ORDER — INSULIN GLARGINE 100 UNIT/ML SOLOSTAR PEN: 22.0000 [IU] | PEN_INJECTOR | Freq: Every day | SUBCUTANEOUS | 3 refills | Status: DC

## 2017-04-26 NOTE — Telephone Encounter (Signed)
Patient stopped by and gave me a prescription label and ask for a refill for Lantus Solostar 100 unit / ML  [facility] in [place]

## 2017-04-26 NOTE — Telephone Encounter (Signed)
Refill sent.

## 2017-05-04 ENCOUNTER — Other Ambulatory Visit: Payer: Self-pay | Admitting: Family Medicine

## 2017-05-22 ENCOUNTER — Other Ambulatory Visit: Payer: Self-pay | Admitting: Family Medicine

## 2017-05-26 ENCOUNTER — Ambulatory Visit (INDEPENDENT_AMBULATORY_CARE_PROVIDER_SITE_OTHER): Payer: Medicare Other | Admitting: Family Medicine

## 2017-05-26 DIAGNOSIS — N3001 Acute cystitis with hematuria: Secondary | ICD-10-CM

## 2017-05-26 LAB — POCT URINALYSIS DIPSTICK
Bilirubin, UA: NEGATIVE
Glucose, UA: NEGATIVE
Ketones, UA: NEGATIVE
Nitrite, UA: POSITIVE
Protein, UA: 30
Spec Grav, UA: 1.025 (ref 1.010–1.025)
Urobilinogen, UA: 0.2 E.U./dL
pH, UA: 5.5 (ref 5.0–8.0)

## 2017-05-26 MED ORDER — CIPROFLOXACIN HCL 500 MG PO TABS: 500.0000 mg | ORAL_TABLET | Freq: Two times a day (BID) | ORAL | 0 refills | Status: DC

## 2017-05-26 NOTE — Progress Notes (Signed)
Cipro sent per standing orders. Pt notified

## 2017-05-28 LAB — URINE CULTURE
MICRO NUMBER:: 90358322
SPECIMEN QUALITY:: ADEQUATE

## 2017-05-30 ENCOUNTER — Telehealth: Payer: Self-pay | Admitting: Family Medicine

## 2017-05-30 ENCOUNTER — Other Ambulatory Visit: Payer: Self-pay | Admitting: Family Medicine

## 2017-05-30 MED ORDER — SULFAMETHOXAZOLE-TRIMETHOPRIM 800-160 MG PO TABS: 1.0000 | ORAL_TABLET | Freq: Two times a day (BID) | ORAL | 0 refills | Status: DC

## 2017-05-30 NOTE — Telephone Encounter (Signed)
Came in for nurse visit and I sent in cipro per standing order. She was not happy with this antibiotic (but no allergy) Wants it changed to the one she requested

## 2017-05-30 NOTE — Telephone Encounter (Signed)
Her phone cuts in and out really bad.  Very hard to here her.  She is asking for sulfamerhoxaz ole-tmp ds.  She said the Urology doctor gave her this in the past and it worked for her in the past.  She said she can not take the medication you gave her on her last visit due to other medical issues she has.

## 2017-05-30 NOTE — Telephone Encounter (Signed)
I explained that ciprofloxacin would be more effective based on the sensittvitiy, but she still prefers septra so this is prescribed, I advised her nmot to take the cipro

## 2017-06-06 ENCOUNTER — Ambulatory Visit: Payer: Medicare Other | Admitting: Family Medicine

## 2017-06-30 DIAGNOSIS — N200 Calculus of kidney: Secondary | ICD-10-CM | POA: Diagnosis not present

## 2017-07-07 ENCOUNTER — Telehealth: Payer: Self-pay | Admitting: Family Medicine

## 2017-07-07 NOTE — Telephone Encounter (Signed)
Patient called in because she was having pain in her side, she stated she has just called and lvm on your voicemail but called back because she was having pain in her side. I told her Dr.Simpson is unavailable and we have no openings , I advised UC or ER.

## 2017-07-12 DIAGNOSIS — E119 Type 2 diabetes mellitus without complications: Secondary | ICD-10-CM | POA: Diagnosis not present

## 2017-07-12 DIAGNOSIS — E785 Hyperlipidemia, unspecified: Secondary | ICD-10-CM | POA: Diagnosis not present

## 2017-07-13 ENCOUNTER — Encounter: Payer: Self-pay | Admitting: Family Medicine

## 2017-07-13 ENCOUNTER — Other Ambulatory Visit (HOSPITAL_COMMUNITY)
Admission: RE | Admit: 2017-07-13 | Discharge: 2017-07-13 | Disposition: A | Discharge status: Home / Self Care | Payer: Medicare Other | Source: Other Acute Inpatient Hospital | Attending: Family Medicine | Admitting: Family Medicine

## 2017-07-13 ENCOUNTER — Ambulatory Visit: Payer: Medicare Other | Admitting: Family Medicine

## 2017-07-13 VITALS — BP 140/82 | HR 94 | Resp 16 | Ht 64.0 in | Wt 145.0 lb

## 2017-07-13 DIAGNOSIS — E785 Hyperlipidemia, unspecified: Secondary | ICD-10-CM | POA: Diagnosis not present

## 2017-07-13 DIAGNOSIS — Z794 Long term (current) use of insulin: Secondary | ICD-10-CM | POA: Diagnosis not present

## 2017-07-13 DIAGNOSIS — I10 Essential (primary) hypertension: Secondary | ICD-10-CM | POA: Diagnosis not present

## 2017-07-13 DIAGNOSIS — E119 Type 2 diabetes mellitus without complications: Secondary | ICD-10-CM

## 2017-07-13 DIAGNOSIS — M542 Cervicalgia: Secondary | ICD-10-CM

## 2017-07-13 DIAGNOSIS — M25511 Pain in right shoulder: Secondary | ICD-10-CM | POA: Diagnosis not present

## 2017-07-13 DIAGNOSIS — L84 Corns and callosities: Secondary | ICD-10-CM | POA: Diagnosis not present

## 2017-07-13 DIAGNOSIS — IMO0001 Reserved for inherently not codable concepts without codable children: Secondary | ICD-10-CM

## 2017-07-13 LAB — COMPLETE METABOLIC PANEL WITH GFR
AG Ratio: 1.6 (calc) (ref 1.0–2.5)
ALT: 11 U/L (ref 6–29)
AST: 12 U/L (ref 10–35)
Albumin: 4.4 g/dL (ref 3.6–5.1)
Alkaline phosphatase (APISO): 78 U/L (ref 33–130)
BUN: 22 mg/dL (ref 7–25)
CO2: 29 mmol/L (ref 20–32)
Calcium: 9.4 mg/dL (ref 8.6–10.4)
Chloride: 101 mmol/L (ref 98–110)
Creat: 0.74 mg/dL (ref 0.50–0.99)
GFR, Est African American: 96 mL/min/{1.73_m2} (ref >=60)
GFR, Est Non African American: 83 mL/min/{1.73_m2} (ref >=60)
Globulin: 2.8 g/dL (calc) (ref 1.9–3.7)
Glucose, Bld: 118 mg/dL — ABNORMAL HIGH (ref 65–99)
Potassium: 4 mmol/L (ref 3.5–5.3)
Sodium: 140 mmol/L (ref 135–146)
Total Bilirubin: 0.5 mg/dL (ref 0.2–1.2)
Total Protein: 7.2 g/dL (ref 6.1–8.1)

## 2017-07-13 LAB — HEMOGLOBIN A1C
Hgb A1c MFr Bld: 7.1 % of total Hgb — ABNORMAL HIGH (ref <5.7)
Mean Plasma Glucose: 157 (calc)
eAG (mmol/L): 8.7 (calc)

## 2017-07-13 LAB — LIPID PANEL
Cholesterol: 197 mg/dL (ref <200)
HDL: 72 mg/dL (ref >50)
LDL Cholesterol (Calc): 109 mg/dL (calc) — ABNORMAL HIGH
Non-HDL Cholesterol (Calc): 125 mg/dL (calc) (ref <130)
Total CHOL/HDL Ratio: 2.7 (calc) (ref <5.0)
Triglycerides: 69 mg/dL (ref <150)

## 2017-07-13 MED ORDER — METHYLPREDNISOLONE ACETATE 80 MG/ML IJ SUSP
80.0000 mg | Freq: Once | INTRAMUSCULAR | Status: AC
  Administered 2017-07-13: 80 mg via INTRAMUSCULAR

## 2017-07-13 MED ORDER — RANITIDINE HCL 300 MG PO TABS: 300.0000 mg | ORAL_TABLET | Freq: Every day | ORAL | 0 refills | Status: DC

## 2017-07-13 MED ORDER — NAPROXEN 375 MG PO TBEC: DELAYED_RELEASE_TABLET | ORAL | 0 refills | Status: DC

## 2017-07-13 MED ORDER — PREDNISONE 5 MG PO TABS: ORAL_TABLET | ORAL | 0 refills | Status: DC

## 2017-07-13 MED ORDER — KETOROLAC TROMETHAMINE 60 MG/2ML IM SOLN
60.0000 mg | Freq: Once | INTRAMUSCULAR | Status: AC
  Administered 2017-07-13: 60 mg via INTRAMUSCULAR

## 2017-07-13 NOTE — Patient Instructions (Signed)
Annual physical exam with MD in 4 months, call if you need me before  Injections in office today for right sided neck and shoulder pain and 3 medications are sent to your pharmacy  Microalb from office today  Mammogram is due please schedule and get this done as we discussed    CONGRATS on excellent labs  You are referred to podiatry re callus on right foot, we will call you with the appointment information  We will mail you any labs you will need before next appointment or call you about them.[email or web address]

## 2017-07-13 NOTE — Assessment & Plan Note (Signed)
Uncontrolled.Toradol and depo medrol administered IM in the office , to be followed by a short course of oral prednisone and NSAIDS.  

## 2017-07-13 NOTE — Assessment & Plan Note (Signed)
Improved, no med change [name] is reminded of the importance of commitment to daily physical activity for 30 minutes or more, as able and the need to limit carbohydrate intake to 30 to 60 grams per meal to help with blood sugar control.   The need to take medication as prescribed, test blood sugar as directed, and to call between visits if there is a concern that blood sugar is uncontrolled is also discussed.   [name] is reminded of the importance of daily foot exam, annual eye examination, and good blood sugar, blood pressure and cholesterol control.  Diabetic Labs Latest Ref Rng & Units 07/12/2017 12/23/2016 09/30/2016 07/01/2016 06/20/2016  HbA1c <5.7 % of total Hgb 7.1(H) 7.4(H) 7.6(H) - 7.3(H)  Microalbumin Not Estab. ug/mL - - - 42.2(H) -  Micro/Creat Ratio 0.0 - 30.0 mg/g creat - - - 46.1(H) -  Chol <200 mg/dL 197 - 167 - 205(H)  HDL >50 mg/dL 72 - 78 - 74  Calc LDL mg/dL (calc) 109(H) - 79 - 116(H)  Triglycerides <150 mg/dL 69 - 51 - 77  Creatinine 0.50 - 0.99 mg/dL 0.74 0.58 0.61 - -   BP/Weight 07/13/2017 02/03/2017 12/27/2016 11/29/2016 10/28/2016 10/04/2016 03/18/7354  Systolic BP 701 410 301 - - - 314  Diastolic BP 82 80 80 - - - 80  Wt. (Lbs) 145 145 146.12 146 143 144 143  BMI 24.89 24.89 25.08 25.06 24.55 24.72 24.55   Foot/eye exam completion dates Latest Ref Rng & Units 07/13/2017 01/21/2017  Eye Exam No Retinopathy - Retinopathy(A)  Foot exam Order - - -  Foot Form Completion - Done -

## 2017-07-14 LAB — MICROALBUMIN / CREATININE URINE RATIO
Creatinine, Urine: 78.3 mg/dL
Microalb Creat Ratio: 22.9 mg/g creat (ref 0.0–30.0)
Microalb, Ur: 17.9 ug/mL — ABNORMAL HIGH

## 2017-07-15 DIAGNOSIS — L84 Corns and callosities: Secondary | ICD-10-CM | POA: Insufficient documentation

## 2017-07-15 HISTORY — DX: Corns and callosities: L84

## 2017-07-15 NOTE — Assessment & Plan Note (Signed)
Increased . Not at goal, no med change today. DASH diet and commitment to daily physical activity for a minimum of 30 minutes discussed and encouraged, as a part of hypertension management. The importance of attaining a healthy weight is also discussed.  BP/Weight 07/13/2017 02/03/2017 12/27/2016 11/29/2016 10/28/2016 10/04/2016 4/65/6812  Systolic BP 751 700 174 - - - 944  Diastolic BP 82 80 80 - - - 80  Wt. (Lbs) 145 145 146.12 146 143 144 143  BMI 24.89 24.89 25.08 25.06 24.55 24.72 24.55

## 2017-07-15 NOTE — Assessment & Plan Note (Signed)
Thick callus of r foot , needs podiatry to manage, will refer

## 2017-07-15 NOTE — Progress Notes (Signed)
[name]     MRN: [id number]      DOB: [birth year]   HPI [name] is here for follow up and re-evaluation of chronic medical conditions, medication management and review of any available recent lab and radiology data.  Preventive health is updated, specifically  Cancer screening and Immunization.  Holding on pneumonia vaccine since getting steroid shots for neck pain . The PT denies any adverse reactions to current medications since the last visit.  1 month h/o severe right neck and RUE pain , has established spine disease, no known trigger  Denies polyuria, polydipsia, blurred vision , or hypoglycemic episodes.   ROS Denies recent fever or chills. Denies sinus pressure, nasal congestion, ear pain or sore throat. Denies chest congestion, productive cough or wheezing. Denies chest pains, palpitations and leg swelling Denies abdominal pain, nausea, vomiting,diarrhea or constipation.   Denies dysuria, frequency, hesitancy or incontinence.  Denies headaches, seizures, numbness, or tingling. Denies depression, anxiety or insomnia. Denies skin break down or rash.   PE  BP 140/82   Pulse 94   Resp 16   Ht 5\' 4"  (1.626 m)   Wt 145 lb (65.8 kg)   SpO2 97%   BMI 24.89 kg/m   Patient alert and oriented and in no cardiopulmonary distress. Pt in pain HEENT: No facial asymmetry, EOMI,   oropharynx pink and moist.  Neck decreased ROM with right neck spasm no JVD, no mass.  Chest: Clear to auscultation bilaterally.  CVS: S1, S2 no murmurs, no S3.Regular rate.  ABD: Soft non tender.   Ext: No edema  MS: Adequate ROM lumbar  spine,  Decreased ROM right shoulders, adequate in hips and knees.  Skin: Intact, no ulcerations or rash noted.  Psych: Good eye contact, normal affect. Memory intact not anxious or depressed appearing.  CNS: CN 2-12 intact, power,  normal throughout.no focal deficits noted.   Assessment & Plan  Neck pain on right side Uncontrolled.Toradol and  depo medrol administered IM in the office , to be followed by a short course of oral prednisone and NSAIDS.   Shoulder pain, right Uncontrolled.Toradol and depo medrol administered IM in the office , to be followed by a short course of oral prednisone and NSAIDS.   DM type 2 (diabetes mellitus, type 2) ([facility]) Improved, no med change [name] is reminded of the importance of commitment to daily physical activity for 30 minutes or more, as able and the need to limit carbohydrate intake to 30 to 60 grams per meal to help with blood sugar control.   The need to take medication as prescribed, test blood sugar as directed, and to call between visits if there is a concern that blood sugar is uncontrolled is also discussed.   [name] is reminded of the importance of daily foot exam, annual eye examination, and good blood sugar, blood pressure and cholesterol control.  Diabetic Labs Latest Ref Rng & Units 07/12/2017 12/23/2016 09/30/2016 07/01/2016 06/20/2016  HbA1c <5.7 % of total Hgb 7.1(H) 7.4(H) 7.6(H) - 7.3(H)  Microalbumin Not Estab. ug/mL - - - 42.2(H) -  Micro/Creat Ratio 0.0 - 30.0 mg/g creat - - - 46.1(H) -  Chol <200 mg/dL 197 - 167 - 205(H)  HDL >50 mg/dL 72 - 78 - 74  Calc LDL mg/dL (calc) 109(H) - 79 - 116(H)  Triglycerides <150 mg/dL 69 - 51 - 77  Creatinine 0.50 - 0.99 mg/dL 0.74 0.58 0.61 - -   BP/Weight 07/13/2017 02/03/2017 12/27/2016 11/29/2016 10/28/2016  10/04/2016 06/30/9561  Systolic BP 875 643 329 - - - 518  Diastolic BP 82 80 80 - - - 80  Wt. (Lbs) 145 145 146.12 146 143 144 143  BMI 24.89 24.89 25.08 25.06 24.55 24.72 24.55   Foot/eye exam completion dates Latest Ref Rng & Units 07/13/2017 01/21/2017  Eye Exam No Retinopathy - Retinopathy(A)  Foot exam Order - - -  Foot Form Completion - Done -         Hyperlipidemia LDL goal <100 Hyperlipidemia:Low fat diet discussed and encouraged.   Lipid Panel  Lab Results  Component Value Date   CHOL 197 07/12/2017   HDL  72 07/12/2017   LDLCALC 109 (H) 07/12/2017   TRIG 69 07/12/2017   CHOLHDL 2.7 07/12/2017   Needs to reduce fat intake     Essential hypertension Increased . Not at goal, no med change today. DASH diet and commitment to daily physical activity for a minimum of 30 minutes discussed and encouraged, as a part of hypertension management. The importance of attaining a healthy weight is also discussed.  BP/Weight 07/13/2017 02/03/2017 12/27/2016 11/29/2016 10/28/2016 10/04/2016 8/41/6606  Systolic BP 301 601 093 - - - 235  Diastolic BP 82 80 80 - - - 80  Wt. (Lbs) 145 145 146.12 146 143 144 143  BMI 24.89 24.89 25.08 25.06 24.55 24.72 24.55       Callus of foot Thick callus of r foot , needs podiatry to manage, will refer

## 2017-07-15 NOTE — Assessment & Plan Note (Signed)
Hyperlipidemia:Low fat diet discussed and encouraged.   Lipid Panel  Lab Results  Component Value Date   CHOL 197 07/12/2017   HDL 72 07/12/2017   LDLCALC 109 (H) 07/12/2017   TRIG 69 07/12/2017   CHOLHDL 2.7 07/12/2017   Needs to reduce fat intake

## 2017-07-25 ENCOUNTER — Other Ambulatory Visit: Payer: Self-pay | Admitting: Family Medicine

## 2017-07-25 ENCOUNTER — Telehealth: Payer: Self-pay | Admitting: Family Medicine

## 2017-07-25 DIAGNOSIS — Z1231 Encounter for screening mammogram for malignant neoplasm of breast: Secondary | ICD-10-CM

## 2017-07-25 NOTE — Telephone Encounter (Signed)
Pt called and wanted to follow up on a referral to a foot doctor.  I did not see anything in her appointment tab referral.

## 2017-07-25 NOTE — Telephone Encounter (Signed)
[facility] in [place] has called her to schedule but had to leave a message for her to contact them. If she calls back, please give them their number.

## 2017-08-04 ENCOUNTER — Ambulatory Visit (HOSPITAL_COMMUNITY)
Admission: RE | Admit: 2017-08-04 | Discharge: 2017-08-04 | Disposition: A | Discharge status: Home / Self Care | Payer: Medicare Other | Source: Ambulatory Visit | Attending: Family Medicine | Admitting: Family Medicine

## 2017-08-04 ENCOUNTER — Encounter (HOSPITAL_COMMUNITY): Payer: Self-pay

## 2017-08-04 DIAGNOSIS — Z1231 Encounter for screening mammogram for malignant neoplasm of breast: Secondary | ICD-10-CM | POA: Insufficient documentation

## 2017-08-10 ENCOUNTER — Telehealth: Payer: Self-pay | Admitting: Family Medicine

## 2017-08-10 ENCOUNTER — Other Ambulatory Visit: Payer: Self-pay

## 2017-08-10 NOTE — Telephone Encounter (Signed)
Vicente Males from [facility] working on medication adhering, atorvastatin 10mg , she has not picked  Up prescription in over 65 days, patient  says Dr. Ricard Dillon  Her dosage to  1/2 a tablet a day, rx says 1 tablet a day.  Prescription needs to be changed to correct dosage. Please advise Cb#: [phone]/ [phone]

## 2017-08-10 NOTE — Telephone Encounter (Signed)
I still see it as 1 daily on her list. Did you tell her to only take half?

## 2017-08-10 NOTE — Patient Outreach (Signed)
[facility]) Care Management  08/10/2017  [name] 03/06/1951 [id number]   Medication Adherence call to Mrs. [name] spoke with patient she is only taking 1/2 tablet a day per doctor, she explain she has been taking 1/2 tablet for a long time now she still has medication . call doctor's office left a message for doctor to change the prescription to 1/2 a tablet instead of 1 tablet a day. Mrs. [name] is showing past due under [employer]on Atorvastatin 10 mg   [facility] Direct Dial [phone]  Fax [phone] [name]

## 2017-08-11 ENCOUNTER — Other Ambulatory Visit: Payer: Self-pay | Admitting: Family Medicine

## 2017-08-11 MED ORDER — ATORVASTATIN CALCIUM 10 MG PO TABS: ORAL_TABLET | ORAL | 1 refills | Status: DC

## 2017-08-11 NOTE — Telephone Encounter (Signed)
If that is how she is taking it , and it is a compliance issue please advise her needs to lower fat intake as her last LDL is slightly above normal, I have sent in new directions to reflect lower dose

## 2017-08-18 NOTE — Telephone Encounter (Signed)
Done

## 2017-08-26 ENCOUNTER — Ambulatory Visit: Payer: Self-pay | Admitting: Podiatry

## 2017-09-26 DIAGNOSIS — N200 Calculus of kidney: Secondary | ICD-10-CM | POA: Diagnosis not present

## 2017-10-02 ENCOUNTER — Other Ambulatory Visit: Payer: Self-pay | Admitting: Family Medicine

## 2017-10-07 ENCOUNTER — Other Ambulatory Visit: Payer: Self-pay

## 2017-10-07 MED ORDER — INSULIN PEN NEEDLE 31G X 8 MM MISC: 9 refills | Status: DC

## 2017-10-12 ENCOUNTER — Other Ambulatory Visit: Payer: Self-pay | Admitting: Family Medicine

## 2017-10-23 ENCOUNTER — Other Ambulatory Visit: Payer: Self-pay | Admitting: Family Medicine

## 2017-11-08 ENCOUNTER — Other Ambulatory Visit: Payer: Self-pay | Admitting: Family Medicine

## 2017-11-11 ENCOUNTER — Other Ambulatory Visit: Payer: Self-pay

## 2017-11-11 DIAGNOSIS — I10 Essential (primary) hypertension: Secondary | ICD-10-CM

## 2017-11-11 DIAGNOSIS — E559 Vitamin D deficiency, unspecified: Secondary | ICD-10-CM

## 2017-11-11 DIAGNOSIS — E785 Hyperlipidemia, unspecified: Secondary | ICD-10-CM | POA: Diagnosis not present

## 2017-11-11 DIAGNOSIS — E118 Type 2 diabetes mellitus with unspecified complications: Secondary | ICD-10-CM | POA: Diagnosis not present

## 2017-11-11 MED ORDER — AMLODIPINE BESYLATE 5 MG PO TABS: 5.0000 mg | ORAL_TABLET | Freq: Every day | ORAL | 1 refills | Status: DC

## 2017-11-12 LAB — COMPLETE METABOLIC PANEL WITH GFR
AG Ratio: 2 (calc) (ref 1.0–2.5)
ALT: 11 U/L (ref 6–29)
AST: 11 U/L (ref 10–35)
Albumin: 4.7 g/dL (ref 3.6–5.1)
Alkaline phosphatase (APISO): 94 U/L (ref 33–130)
BUN: 16 mg/dL (ref 7–25)
CO2: 27 mmol/L (ref 20–32)
Calcium: 9.7 mg/dL (ref 8.6–10.4)
Chloride: 102 mmol/L (ref 98–110)
Creat: 0.65 mg/dL (ref 0.50–0.99)
GFR, Est African American: 106 mL/min/{1.73_m2} (ref >=60)
GFR, Est Non African American: 91 mL/min/{1.73_m2} (ref >=60)
Globulin: 2.4 g/dL (calc) (ref 1.9–3.7)
Glucose, Bld: 93 mg/dL (ref 65–99)
Potassium: 4.7 mmol/L (ref 3.5–5.3)
Sodium: 139 mmol/L (ref 135–146)
Total Bilirubin: 0.4 mg/dL (ref 0.2–1.2)
Total Protein: 7.1 g/dL (ref 6.1–8.1)

## 2017-11-12 LAB — HEMOGLOBIN A1C
Hgb A1c MFr Bld: 7.6 % of total Hgb — ABNORMAL HIGH (ref <5.7)
Mean Plasma Glucose: 171 (calc)
eAG (mmol/L): 9.5 (calc)

## 2017-11-12 LAB — CBC
HCT: 37.5 % (ref 35.0–45.0)
Hemoglobin: 12.3 g/dL (ref 11.7–15.5)
MCH: 28.7 pg (ref 27.0–33.0)
MCHC: 32.8 g/dL (ref 32.0–36.0)
MCV: 87.4 fL (ref 80.0–100.0)
MPV: 10.2 fL (ref 7.5–12.5)
Platelets: 356 10*3/uL (ref 140–400)
RBC: 4.29 10*6/uL (ref 3.80–5.10)
RDW: 12.2 % (ref 11.0–15.0)
WBC: 4.1 10*3/uL (ref 3.8–10.8)

## 2017-11-12 LAB — LIPID PANEL
Cholesterol: 189 mg/dL (ref <200)
HDL: 71 mg/dL (ref >50)
LDL Cholesterol (Calc): 102 mg/dL (calc) — ABNORMAL HIGH
Non-HDL Cholesterol (Calc): 118 mg/dL (calc) (ref <130)
Total CHOL/HDL Ratio: 2.7 (calc) (ref <5.0)
Triglycerides: 71 mg/dL (ref <150)

## 2017-11-12 LAB — VITAMIN D 25 HYDROXY (VIT D DEFICIENCY, FRACTURES): Vit D, 25-Hydroxy: 25 ng/mL — ABNORMAL LOW (ref 30–100)

## 2017-11-15 ENCOUNTER — Ambulatory Visit (INDEPENDENT_AMBULATORY_CARE_PROVIDER_SITE_OTHER): Payer: Medicare Other | Admitting: Family Medicine

## 2017-11-15 ENCOUNTER — Encounter: Payer: Self-pay | Admitting: Family Medicine

## 2017-11-15 ENCOUNTER — Encounter (INDEPENDENT_AMBULATORY_CARE_PROVIDER_SITE_OTHER): Payer: Self-pay | Admitting: *Deleted

## 2017-11-15 VITALS — BP 122/78 | HR 87 | Resp 16 | Ht 65.0 in | Wt 144.1 lb

## 2017-11-15 DIAGNOSIS — E785 Hyperlipidemia, unspecified: Secondary | ICD-10-CM | POA: Diagnosis not present

## 2017-11-15 DIAGNOSIS — E1169 Type 2 diabetes mellitus with other specified complication: Secondary | ICD-10-CM

## 2017-11-15 DIAGNOSIS — Z794 Long term (current) use of insulin: Secondary | ICD-10-CM

## 2017-11-15 DIAGNOSIS — Z1211 Encounter for screening for malignant neoplasm of colon: Secondary | ICD-10-CM | POA: Diagnosis not present

## 2017-11-15 DIAGNOSIS — Z23 Encounter for immunization: Secondary | ICD-10-CM

## 2017-11-15 DIAGNOSIS — I10 Essential (primary) hypertension: Secondary | ICD-10-CM | POA: Diagnosis not present

## 2017-11-15 MED ORDER — GLIPIZIDE 10 MG PO TABS: 10.0000 mg | ORAL_TABLET | Freq: Two times a day (BID) | ORAL | 3 refills | Status: DC

## 2017-11-15 MED ORDER — METFORMIN HCL 1000 MG PO TABS: 1000.0000 mg | ORAL_TABLET | Freq: Two times a day (BID) | ORAL | 3 refills | Status: DC

## 2017-11-15 NOTE — Progress Notes (Signed)
[name]     MRN: [id number]      DOB: [birth year]   HPI [name] is here for follow up and re-evaluation of chronic medical conditions, medication management and review of any available recent lab and radiology data.  Preventive health is updated, specifically  Cancer screening and Immunization.  Needs colonoscopy and flu and pneumonia vaccines Questions or concerns regarding consultations or procedures which the PT has had in the interim are  addressed. The PT denies any adverse reactions to current medications since the last visit.  C/o weight gain, esp around belly and lack of exercise, intends to work on both Has not been testing as regularly as she should in the past month and sometimes forget night time medication, falls asleep, intends to change this Denies polyuria, polydipsia, blurred vision , or hypoglycemic episodes. ROS Denies recent fever or chills. Denies sinus pressure, nasal congestion, ear pain or sore throat. Denies chest congestion, productive cough or wheezing. Denies chest pains, palpitations and leg swelling Denies abdominal pain, nausea, vomiting,diarrhea or constipation.   Denies dysuria, frequency, hesitancy or incontinence. Denies joint pain, swelling and limitation in mobility. Denies headaches, seizures, numbness, or tingling. Denies depression, anxiety or insomnia. Denies skin break down or rash.   PE  BP 122/78   Pulse 87   Resp 16   Ht 5\' 5"  (1.651 m)   Wt 144 lb 1.9 oz (65.4 kg)   SpO2 98%   BMI 23.98 kg/m   Patient alert and oriented and in no cardiopulmonary distress.  HEENT: No facial asymmetry, EOMI,   oropharynx pink and moist.  Neck supple no JVD, no mass.  Chest: Clear to auscultation bilaterally.  CVS: S1, S2 no murmurs, no S3.Regular rate.  ABD: Soft non tender.   Ext: No edema  MS: Adequate ROM spine, shoulders, hips and knees.  Skin: Intact, no ulcerations or rash noted.  Psych: Good eye contact, normal affect.  Memory intact not anxious or depressed appearing.  CNS: CN 2-12 intact, power,  normal throughout.no focal deficits noted.   Assessment & Plan  Type 2 diabetes mellitus with other specified complication (HCC) Controlled, no change in medication [name] is reminded of the importance of commitment to daily physical activity for 30 minutes or more, as able and the need to limit carbohydrate intake to 30 to 60 grams per meal to help with blood sugar control.   The need to take medication as prescribed, test blood sugar as directed, and to call between visits if there is a concern that blood sugar is uncontrolled is also discussed.   [name] is reminded of the importance of daily foot exam, annual eye examination, and good blood sugar, blood pressure and cholesterol control.  Diabetic Labs Latest Ref Rng & Units 11/11/2017 07/13/2017 07/12/2017 12/23/2016 09/30/2016  HbA1c <5.7 % of total Hgb 7.6(H) - 7.1(H) 7.4(H) 7.6(H)  Microalbumin Not Estab. ug/mL - 17.9(H) - - -  Micro/Creat Ratio 0.0 - 30.0 mg/g creat - 22.9 - - -  Chol <200 mg/dL 189 - 197 - 167  HDL >50 mg/dL 71 - 72 - 78  Calc LDL mg/dL (calc) 102(H) - 109(H) - 79  Triglycerides <150 mg/dL 71 - 69 - 51  Creatinine 0.50 - 0.99 mg/dL 0.65 - 0.74 0.58 0.61   BP/Weight 11/15/2017 07/13/2017 02/03/2017 12/27/2016 11/29/2016 10/28/2016 0/99/8338  Systolic BP 250 539 767 341 - - -  Diastolic BP 78 82 80 80 - - -  Wt. (Lbs) 144.12 145  145 146.12 146 143 144  BMI 23.98 24.89 24.89 25.08 25.06 24.55 24.72   Foot/eye exam completion dates Latest Ref Rng & Units 07/13/2017 01/21/2017  Eye Exam No Retinopathy - Retinopathy(A)  Foot exam Order - - -  Foot Form Completion - Done -        Hyperlipidemia LDL goal <100 Hyperlipidemia:Low fat diet discussed and encouraged.   Lipid Panel  Lab Results  Component Value Date   CHOL 189 11/11/2017   HDL 71 11/11/2017   LDLCALC 102 (H) 11/11/2017   TRIG 71 11/11/2017   CHOLHDL 2.7 11/11/2017     Needs to reduce fried and fatty food, not at goal    Essential hypertension Controlled, no change in medication DASH diet and commitment to daily physical activity for a minimum of 30 minutes discussed and encouraged, as a part of hypertension management. The importance of attaining a healthy weight is also discussed.  BP/Weight 11/15/2017 07/13/2017 02/03/2017 12/27/2016 11/29/2016 10/28/2016 11/30/4626  Systolic BP 638 177 116 579 - - -  Diastolic BP 78 82 80 80 - - -  Wt. (Lbs) 144.12 145 145 146.12 146 143 144  BMI 23.98 24.89 24.89 25.08 25.06 24.55 24.72

## 2017-11-15 NOTE — Patient Instructions (Addendum)
Wellness with nurse as before  Pneumonia 23 at that visit  Physical with MD 2nd week I December, call if you need me before  HBa1C, chem7 and EGFR and TSH non fasting 3 days before December visit  Need to start walking daily, ask your sister to go with you before the bust day starts  Please commit to eating on a schedule  Goal for fasting blood sugar ranges from 90 to 130 and 2 hours after any meal or at bedtime should be between 130 to 170.   Blood sugar needs to be reduced also bad cholesterol slightly high   Flu vaccine today  Excellent blood pressure, kidney and liver function  Increase vit D capsule to two daily  You are referred to Dr Debbe Bales for your colonoscopy

## 2017-11-18 ENCOUNTER — Encounter: Payer: Self-pay | Admitting: Family Medicine

## 2017-11-18 NOTE — Assessment & Plan Note (Signed)
Controlled, no change in medication [name] is reminded of the importance of commitment to daily physical activity for 30 minutes or more, as able and the need to limit carbohydrate intake to 30 to 60 grams per meal to help with blood sugar control.   The need to take medication as prescribed, test blood sugar as directed, and to call between visits if there is a concern that blood sugar is uncontrolled is also discussed.   [name] is reminded of the importance of daily foot exam, annual eye examination, and good blood sugar, blood pressure and cholesterol control.  Diabetic Labs Latest Ref Rng & Units 11/11/2017 07/13/2017 07/12/2017 12/23/2016 09/30/2016  HbA1c <5.7 % of total Hgb 7.6(H) - 7.1(H) 7.4(H) 7.6(H)  Microalbumin Not Estab. ug/mL - 17.9(H) - - -  Micro/Creat Ratio 0.0 - 30.0 mg/g creat - 22.9 - - -  Chol <200 mg/dL 189 - 197 - 167  HDL >50 mg/dL 71 - 72 - 78  Calc LDL mg/dL (calc) 102(H) - 109(H) - 79  Triglycerides <150 mg/dL 71 - 69 - 51  Creatinine 0.50 - 0.99 mg/dL 0.65 - 0.74 0.58 0.61   BP/Weight 11/15/2017 07/13/2017 02/03/2017 12/27/2016 11/29/2016 10/28/2016 08/12/3708  Systolic BP 626 948 546 270 - - -  Diastolic BP 78 82 80 80 - - -  Wt. (Lbs) 144.12 145 145 146.12 146 143 144  BMI 23.98 24.89 24.89 25.08 25.06 24.55 24.72   Foot/eye exam completion dates Latest Ref Rng & Units 07/13/2017 01/21/2017  Eye Exam No Retinopathy - Retinopathy(A)  Foot exam Order - - -  Foot Form Completion - Done -

## 2017-11-18 NOTE — Assessment & Plan Note (Signed)
Controlled, no change in medication DASH diet and commitment to daily physical activity for a minimum of 30 minutes discussed and encouraged, as a part of hypertension management. The importance of attaining a healthy weight is also discussed.  BP/Weight 11/15/2017 07/13/2017 02/03/2017 12/27/2016 11/29/2016 10/28/2016 5/74/9355  Systolic BP 217 471 595 396 - - -  Diastolic BP 78 82 80 80 - - -  Wt. (Lbs) 144.12 145 145 146.12 146 143 144  BMI 23.98 24.89 24.89 25.08 25.06 24.55 24.72

## 2017-11-18 NOTE — Assessment & Plan Note (Signed)
Hyperlipidemia:Low fat diet discussed and encouraged.   Lipid Panel  Lab Results  Component Value Date   CHOL 189 11/11/2017   HDL 71 11/11/2017   LDLCALC 102 (H) 11/11/2017   TRIG 71 11/11/2017   CHOLHDL 2.7 11/11/2017   Needs to reduce fried and fatty food, not at goal

## 2017-12-26 ENCOUNTER — Ambulatory Visit (INDEPENDENT_AMBULATORY_CARE_PROVIDER_SITE_OTHER): Payer: Medicare Other

## 2017-12-26 VITALS — BP 124/80 | HR 90 | Resp 12 | Ht 65.0 in | Wt 146.0 lb

## 2017-12-26 DIAGNOSIS — Z23 Encounter for immunization: Secondary | ICD-10-CM

## 2017-12-26 DIAGNOSIS — Z0000 Encounter for general adult medical examination without abnormal findings: Secondary | ICD-10-CM

## 2017-12-26 NOTE — Progress Notes (Signed)
Subjective:   [name] is a 67 y.o. female who presents for Medicare Annual (Subsequent) preventive examination.  Review of Systems:   Cardiac Risk Factors include: advanced age (>46men, >72 women);diabetes mellitus;dyslipidemia;hypertension;sedentary lifestyle     Objective:     Vitals: BP 124/80   Pulse 90   Resp 12   Ht 5\' 5"  (1.651 m)   Wt 146 lb (66.2 kg)   SpO2 98%   BMI 24.30 kg/m   Body mass index is 24.3 kg/m.  Advanced Directives 12/26/2017 12/27/2016 10/28/2016 06/19/2016 05/31/2016 05/25/2016 02/24/2016  Does Patient Have a Medical Advance Directive? No No No No No No No  Would patient like information on creating a medical advance directive? Yes (ED - Information included in AVS) (No Data) - No - Patient declined No - Patient declined No - Patient declined Yes (ED - Information included in AVS)    Tobacco Social History   Tobacco Use  Smoking Status Never Smoker  Smokeless Tobacco Never Used     Counseling given: Not Answered   Clinical Intake:   Pre-visit preparation completed: Yes  Pain : No/denies pain Pain Score: 0-No pain     Nutritional Status: BMI of 19-24  Normal Diabetes: Yes CBG done?: No Did pt. bring in CBG monitor from home?: No  How often do you need to have someone help you when you read instructions, pamphlets, or other written materials from your doctor or pharmacy?: 1 - Never What is the last grade level you completed in school?: 12th grade   Interpreter Needed?: No  Information entered by :: Wilford Merryfield LPN   Past Medical History:  Diagnosis Date  . Essential hypertension   . Headache(784.0)   . Hyperlipidemia   . Type 2 diabetes mellitus ([place])   . Vaginitis    Past Surgical History:  Procedure Laterality Date  . NEPHROLITHOTOMY Right 05/31/2016   Procedure: NEPHROLITHOTOMY PERCUTANEOUS WITH SURGEON ACCESS;  Surgeon: Cleon Gustin, MD;  Location: [facility];  Service: Urology;  Laterality: Right;  . TOTAL  ABDOMINAL HYSTERECTOMY W/ BILATERAL SALPINGOOPHORECTOMY  1998   Family History  Problem Relation Age of Onset  . Alcohol abuse Mother   . Esophageal cancer Father   . Alcohol abuse Father   . Diabetes Sister   . Diabetes Sister   . Hypertension Sister   . Heart attack Sister        in 84's  . Seizures Brother    Social History   Socioeconomic History  . Marital status: Single    Spouse name: Not on file  . Number of children: 1  . Years of education: Not on file  . Highest education level: 12th grade  Occupational History  . Occupation: employed   Scientific laboratory technician  . Financial resource strain: Not hard at all  . Food insecurity:    Worry: Never true    Inability: Never true  . Transportation needs:    Medical: No    Non-medical: No  Tobacco Use  . Smoking status: Never Smoker  . Smokeless tobacco: Never Used  Substance and Sexual Activity  . Alcohol use: No  . Drug use: No  . Sexual activity: Not Currently  Lifestyle  . Physical activity:    Days per week: 0 days    Minutes per session: 0 min  . Stress: Only a little  Relationships  . Social connections:    Talks on phone: Twice a week    Gets together: Once a week  Attends religious service: More than 4 times per year    Active member of club or organization: Yes    Attends meetings of clubs or organizations: 1 to 4 times per year    Relationship status: Never married  Other Topics Concern  . Not on file  Social History Narrative  . Not on file    Outpatient Encounter Medications as of 12/26/2017  Medication Sig  . amLODipine (NORVASC) 5 MG tablet TAKE 1 TABLET BY MOUTH EVERY DAY  . amLODipine (NORVASC) 5 MG tablet Take 1 tablet (5 mg total) by mouth daily.  Marland Kitchen aspirin EC 81 MG tablet Take 1 tablet (81 mg total) by mouth daily.  Marland Kitchen atorvastatin (LIPITOR) 10 MG tablet Half tablet by mouth daily  . cholecalciferol (VITAMIN D) 1000 units tablet Take 1,000 Units by mouth daily.  Marland Kitchen glipiZIDE (GLUCOTROL) 10 MG  tablet Take 1 tablet (10 mg total) by mouth 2 (two) times daily before a meal.  . glucose blood (ONE TOUCH ULTRA TEST) test strip USE TO TEST BLOOD SUGAR 3 TIMES A DAY AS DIRECTED DX E11.65  . Insulin Pen Needle (B-D ULTRAFINE III SHORT PEN) 31G X 8 MM MISC FOR USE WITH INSULIN once daily dx E11.9  . LANTUS SOLOSTAR 100 UNIT/ML Solostar Pen INJECT 22 UNITS INTO THE SKIN DAILY AT 10 PM.  . metFORMIN (GLUCOPHAGE) 1000 MG tablet Take 1 tablet (1,000 mg total) by mouth 2 (two) times daily with a meal.   No facility-administered encounter medications on file as of 12/26/2017.     Activities of Daily Living In your present state of health, do you have any difficulty performing the following activities: 12/26/2017 12/27/2016  Hearing? N N  Comment some ringing in the ears  -  Vision? N Y  Comment - Recommend follow up with Eye Dr.   Difficulty concentrating or making decisions? Y N  Comment more forgetful that used to be  -  Walking or climbing stairs? N N  Dressing or bathing? N N  Doing errands, shopping? N N  Preparing Food and eating ? N N  Using the Toilet? N N  In the past six months, have you accidently leaked urine? N N  Do you have problems with loss of bowel control? N N  Managing your Medications? N N  Managing your Finances? N N  Housekeeping or managing your Housekeeping? N N  Some recent data might be hidden    Patient Care Team: Fayrene Helper, MD as PCP - General Williams Che, MD (Inactive) (Ophthalmology)    Assessment:   This is a routine wellness examination for [name].  Exercise Activities and Dietary recommendations Current Exercise Habits: The patient does not participate in regular exercise at present, Exercise limited by: Other - see comments(lack of motivation )  Goals    . DIET - INCREASE WATER INTAKE    . Eat more fruits and vegetables    . Exercise 3x per week (30 min per time)    . Increase physical activity     Walking for 30 mins for  3 days a week        Fall Risk Fall Risk  12/26/2017 11/15/2017 07/13/2017 12/27/2016 10/04/2016  Falls in the past year? No No No No No  Risk for fall due to : - - - - -   Is the patient's home free of loose throw rugs in walkways, pet beds, electrical cords, etc?   yes  Grab bars in the bathroom? yes      Handrails on the stairs?   yes      Adequate lighting?   yes  Timed Get Up and Go performed: patient performed in 7 seconds. Normal mobility. No assistive device needed   Depression Screen PHQ 2/9 Scores 12/26/2017 11/15/2017 12/27/2016 10/04/2016  PHQ - 2 Score 0 0 0 0  PHQ- 9 Score - - - -     Cognitive Function     6CIT Screen 12/26/2017 12/27/2016 02/24/2016  What Year? 0 points 0 points 0 points  What month? 0 points 0 points 0 points  What time? 0 points 0 points 0 points  Count back from 20 0 points 0 points 0 points  Months in reverse 0 points 0 points 0 points  Repeat phrase 4 points 0 points 0 points  Total Score 4 0 0    Immunization History  Administered Date(s) Administered  . Influenza Split 04/24/2012  . Influenza,inj,Quad PF,6+ Mos 01/25/2014, 03/14/2015, 05/24/2016, 12/27/2016, 11/15/2017  . Pneumococcal Conjugate-13 05/24/2016  . Tdap 04/24/2012  . Zoster 09/13/2013    Qualifies for Shingles Vaccine? Will ask insurance if covered   Screening Tests Health Maintenance  Topic Date Due  . PNA vac Low Risk Adult (2 of 2 - PPSV23) 05/24/2017  . COLONOSCOPY  09/18/2017  . OPHTHALMOLOGY EXAM  01/21/2018  . HEMOGLOBIN A1C  05/12/2018  . FOOT EXAM  07/14/2018  . URINE MICROALBUMIN  07/14/2018  . MAMMOGRAM  08/05/2019  . TETANUS/TDAP  04/24/2022  . INFLUENZA VACCINE  Completed  . DEXA SCAN  Completed  . Hepatitis C Screening  Completed    Cancer Screenings: Lung: Low Dose CT Chest recommended if Age 32-80 years, 30 pack-year currently smoking OR have quit w/in 15years. Patient does not qualify. Breast:  Up to date on Mammogram? Yes   Up to  date of Bone Density/Dexa? Yes Colorectal: due- referral entered and will contact them to schedule   Additional Screenings:  Hepatitis C Screening: complete     Plan:     I have personally reviewed and noted the following in the patient's chart:   . Medical and social history . Use of alcohol, tobacco or illicit drugs  . Current medications and supplements . Functional ability and status . Nutritional status . Physical activity . Advanced directives . List of other physicians . Hospitalizations, surgeries, and ER visits in previous 12 months . Vitals . Screenings to include cognitive, depression, and falls . Referrals and appointments  In addition, I have reviewed and discussed with patient certain preventive protocols, quality metrics, and best practice recommendations. A written personalized care plan for preventive services as well as general preventive health recommendations were provided to patient.     Kate Sable, LPN, LPN  [id number]

## 2017-12-26 NOTE — Patient Instructions (Addendum)
[name] , Thank you for taking time to come for your Medicare Wellness Visit. I appreciate your ongoing commitment to your health goals. Please review the following plan we discussed and let me know if I can assist you in the future.   Screening recommendations/referrals: Colonoscopy: due- referred  Mammogram: up to date Bone Density: up to date  Recommended yearly ophthalmology/optometry visit for glaucoma screening and checkup Recommended yearly dental visit for hygiene and checkup  Vaccinations: Influenza vaccine: up to date  Pneumococcal vaccine: due- given today (pneumococcal 23)  Tdap vaccine: up to date  Shingles vaccine: ask insurance if covered (Shingrix)    Advanced directives: information given with AVS  Conditions/risks identified: sedentary lifestyle- will increase physical activity   Next appointment: wellness in 1 year   Preventive Care 45 Years and Older, Female Preventive care refers to lifestyle choices and visits with your health care provider that can promote health and wellness. What does preventive care include?  A yearly physical exam. This is also called an annual well check.  Dental exams once or twice a year.  Routine eye exams. Ask your health care provider how often you should have your eyes checked.  Personal lifestyle choices, including:  Daily care of your teeth and gums.  Regular physical activity.  Eating a healthy diet.  Avoiding tobacco and drug use.  Limiting alcohol use.  Practicing safe sex.  Taking low-dose aspirin every day.  Taking vitamin and mineral supplements as recommended by your health care provider. What happens during an annual well check? The services and screenings done by your health care provider during your annual well check will depend on your age, overall health, lifestyle risk factors, and family history of disease. Counseling  Your health care provider may ask you questions about your:  Alcohol  use.  Tobacco use.  Drug use.  Emotional well-being.  Home and relationship well-being.  Sexual activity.  Eating habits.  History of falls.  Memory and ability to understand (cognition).  Work and work Statistician.  Reproductive health. Screening  You may have the following tests or measurements:  Height, weight, and BMI.  Blood pressure.  Lipid and cholesterol levels. These may be checked every 5 years, or more frequently if you are over 59 years old.  Skin check.  Lung cancer screening. You may have this screening every year starting at age 68 if you have a 30-pack-year history of smoking and currently smoke or have quit within the past 15 years.  Fecal occult blood test (FOBT) of the stool. You may have this test every year starting at age 68.  Flexible sigmoidoscopy or colonoscopy. You may have a sigmoidoscopy every 5 years or a colonoscopy every 10 years starting at age 68.  Hepatitis C blood test.  Hepatitis B blood test.  Sexually transmitted disease (STD) testing.  Diabetes screening. This is done by checking your blood sugar (glucose) after you have not eaten for a while (fasting). You may have this done every 1-3 years.  Bone density scan. This is done to screen for osteoporosis. You may have this done starting at age 68.  Mammogram. This may be done every 1-2 years. Talk to your health care provider about how often you should have regular mammograms. Talk with your health care provider about your test results, treatment options, and if necessary, the need for more tests. Vaccines  Your health care provider may recommend certain vaccines, such as:  Influenza vaccine. This is recommended every year.  Tetanus, diphtheria, and acellular pertussis (Tdap, Td) vaccine. You may need a Td booster every 10 years.  Zoster vaccine. You may need this after age 11.  Pneumococcal 13-valent conjugate (PCV13) vaccine. One dose is recommended after age  6.  Pneumococcal polysaccharide (PPSV23) vaccine. One dose is recommended after age 68. Talk to your health care provider about which screenings and vaccines you need and how often you need them. This information is not intended to replace advice given to you by your health care provider. Make sure you discuss any questions you have with your health care provider. Document Released: 03/21/2015 Document Revised: 11/12/2015 Document Reviewed: 12/24/2014 Elsevier Interactive Patient Education  2017 [place] Prevention in the Home Falls can cause injuries. They can happen to people of all ages. There are many things you can do to make your home safe and to help prevent falls. What can I do on the outside of my home?  Regularly fix the edges of walkways and driveways and fix any cracks.  Remove anything that might make you trip as you walk through a door, such as a raised step or threshold.  Trim any bushes or trees on the path to your home.  Use bright outdoor lighting.  Clear any walking paths of anything that might make someone trip, such as rocks or tools.  Regularly check to see if handrails are loose or broken. Make sure that both sides of any steps have handrails.  Any raised decks and porches should have guardrails on the edges.  Have any leaves, snow, or ice cleared regularly.  Use sand or salt on walking paths during winter.  Clean up any spills in your garage right away. This includes oil or grease spills. What can I do in the bathroom?  Use night lights.  Install grab bars by the toilet and in the tub and shower. Do not use towel bars as grab bars.  Use non-skid mats or decals in the tub or shower.  If you need to sit down in the shower, use a plastic, non-slip stool.  Keep the floor dry. Clean up any water that spills on the floor as soon as it happens.  Remove soap buildup in the tub or shower regularly.  Attach bath mats securely with double-sided  non-slip rug tape.  Do not have throw rugs and other things on the floor that can make you trip. What can I do in the bedroom?  Use night lights.  Make sure that you have a light by your bed that is easy to reach.  Do not use any sheets or blankets that are too big for your bed. They should not hang down onto the floor.  Have a firm chair that has side arms. You can use this for support while you get dressed.  Do not have throw rugs and other things on the floor that can make you trip. What can I do in the kitchen?  Clean up any spills right away.  Avoid walking on wet floors.  Keep items that you use a lot in easy-to-reach places.  If you need to reach something above you, use a strong step stool that has a grab bar.  Keep electrical cords out of the way.  Do not use floor polish or wax that makes floors slippery. If you must use wax, use non-skid floor wax.  Do not have throw rugs and other things on the floor that can make you trip. What can I do  with my stairs?  Do not leave any items on the stairs.  Make sure that there are handrails on both sides of the stairs and use them. Fix handrails that are broken or loose. Make sure that handrails are as long as the stairways.  Check any carpeting to make sure that it is firmly attached to the stairs. Fix any carpet that is loose or worn.  Avoid having throw rugs at the top or bottom of the stairs. If you do have throw rugs, attach them to the floor with carpet tape.  Make sure that you have a light switch at the top of the stairs and the bottom of the stairs. If you do not have them, ask someone to add them for you. What else can I do to help prevent falls?  Wear shoes that:  Do not have high heels.  Have rubber bottoms.  Are comfortable and fit you well.  Are closed at the toe. Do not wear sandals.  If you use a stepladder:  Make sure that it is fully opened. Do not climb a closed stepladder.  Make sure that both  sides of the stepladder are locked into place.  Ask someone to hold it for you, if possible.  Clearly mark and make sure that you can see:  Any grab bars or handrails.  First and last steps.  Where the edge of each step is.  Use tools that help you move around (mobility aids) if they are needed. These include:  Canes.  Walkers.  Scooters.  Crutches.  Turn on the lights when you go into a dark area. Replace any light bulbs as soon as they burn out.  Set up your furniture so you have a clear path. Avoid moving your furniture around.  If any of your floors are uneven, fix them.  If there are any pets around you, be aware of where they are.  Review your medicines with your doctor. Some medicines can make you feel dizzy. This can increase your chance of falling. Ask your doctor what other things that you can do to help prevent falls. This information is not intended to replace advice given to you by your health care provider. Make sure you discuss any questions you have with your health care provider. Document Released: 12/19/2008 Document Revised: 07/31/2015 Document Reviewed: 03/29/2014 Elsevier Interactive Patient Education  2017 [employer].

## 2018-02-20 DIAGNOSIS — E118 Type 2 diabetes mellitus with unspecified complications: Secondary | ICD-10-CM | POA: Diagnosis not present

## 2018-02-21 LAB — BASIC METABOLIC PANEL WITH GFR
BUN: 12 mg/dL (ref 7–25)
CO2: 29 mmol/L (ref 20–32)
Calcium: 10 mg/dL (ref 8.6–10.4)
Chloride: 100 mmol/L (ref 98–110)
Creat: 0.64 mg/dL (ref 0.50–0.99)
GFR, Est African American: 106 mL/min/{1.73_m2} (ref >=60)
GFR, Est Non African American: 92 mL/min/{1.73_m2} (ref >=60)
Glucose, Bld: 146 mg/dL — ABNORMAL HIGH (ref 65–99)
Potassium: 4.2 mmol/L (ref 3.5–5.3)
Sodium: 141 mmol/L (ref 135–146)

## 2018-02-21 LAB — HEMOGLOBIN A1C
Hgb A1c MFr Bld: 7.7 % of total Hgb — ABNORMAL HIGH (ref <5.7)
Mean Plasma Glucose: 174 (calc)
eAG (mmol/L): 9.7 (calc)

## 2018-02-21 LAB — TSH: TSH: 0.72 mIU/L (ref 0.40–4.50)

## 2018-02-22 ENCOUNTER — Encounter: Payer: Self-pay | Admitting: Family Medicine

## 2018-02-22 ENCOUNTER — Ambulatory Visit (INDEPENDENT_AMBULATORY_CARE_PROVIDER_SITE_OTHER): Payer: Medicare Other | Admitting: Family Medicine

## 2018-02-22 VITALS — BP 128/78 | HR 72 | Resp 15 | Ht 65.0 in | Wt 147.0 lb

## 2018-02-22 DIAGNOSIS — Z1211 Encounter for screening for malignant neoplasm of colon: Secondary | ICD-10-CM

## 2018-02-22 DIAGNOSIS — I10 Essential (primary) hypertension: Secondary | ICD-10-CM

## 2018-02-22 DIAGNOSIS — E785 Hyperlipidemia, unspecified: Secondary | ICD-10-CM

## 2018-02-22 DIAGNOSIS — E1169 Type 2 diabetes mellitus with other specified complication: Secondary | ICD-10-CM

## 2018-02-22 DIAGNOSIS — Z0000 Encounter for general adult medical examination without abnormal findings: Secondary | ICD-10-CM

## 2018-02-22 DIAGNOSIS — Z794 Long term (current) use of insulin: Secondary | ICD-10-CM

## 2018-02-22 NOTE — Patient Instructions (Addendum)
F/U end March, call if you need me before   Please follow through on referral and schedule appointment with Dr Laural Golden, your colonoscopy is past due  Pls schedule your eye exam  Fasting lipid, cmp and eGFR, HBA1C 1 week before follow up  Thank you  for choosing [facility]. We consider it a privelige to serve you.  Delivering excellent health care in a caring and  compassionate way is our goal.  Partnering with you,  so that together we can achieve this goal is our strategy.

## 2018-02-22 NOTE — Progress Notes (Signed)
    [name]     MRN: [id number]      DOB: Sep 18, 1950  HPI: Patient is in for annual physical exam. No other health concerns are expressed or addressed at the visit. Recent labs, if available are reviewed. Immunization is reviewed , and  updated if needed.   PE: BP 128/78   Pulse 72   Resp 15   Ht 5\' 5"  (1.651 m)   Wt 147 lb (66.7 kg)   BMI 24.46 kg/m     Pleasant  female, alert and oriented x 3, in no cardio-pulmonary distress. Afebrile. HEENT No facial trauma or asymetry. Sinuses non tender.  Extra occullar muscles intact, pupils equally reactive to light. External ears normal, tympanic membranes clear. Oropharynx moist, no exudate. Neck: supple, no adenopathy,JVD or thyromegaly.No bruits.  Chest: Clear to ascultation bilaterally.No crackles or wheezes. Non tender to palpation  Breast: No asymetry,no masses or lumps. No tenderness. No nipple discharge or inversion. No axillary or supraclavicular adenopathy  Cardiovascular system; Heart sounds normal,  S1 and  S2 ,no S3.  No murmur, or thrill. Apical beat not displaced Peripheral pulses normal.  Abdomen: Soft, non tender, no organomegaly or masses. No bruits. Bowel sounds normal. No guarding, tenderness or rebound.   Musculoskeletal exam: Full ROM of spine, hips , shoulders and knees. No deformity ,swelling or crepitus noted. No muscle wasting or atrophy.   Neurologic: Cranial nerves 2 to 12 intact. Power, tone ,sensation and reflexes normal throughout. No disturbance in gait. No tremor.  Skin: Intact, no ulceration, erythema , scaling or rash noted. Pigmentation normal throughout  Psych; Normal mood and affect. Judgement and concentration normal   Assessment & Plan:  Annual physical exam Annual exam as documented. Counseling done  re healthy lifestyle involving commitment to 150 minutes exercise per week, heart healthy diet, and attaining healthy weight.The importance of adequate sleep  also discussed. Regular seat belt use and home safety, is also discussed. Changes in health habits are decided on by the patient with goals and time frames  set for achieving them. Immunization and cancer screening needs are specifically addressed at this visit.

## 2018-02-24 ENCOUNTER — Encounter: Payer: Self-pay | Admitting: Family Medicine

## 2018-02-24 NOTE — Assessment & Plan Note (Signed)

## 2018-02-24 NOTE — Assessment & Plan Note (Signed)
Controlled, no change in medication [name] is reminded of the importance of commitment to daily physical activity for 30 minutes or more, as able and the need to limit carbohydrate intake to 30 to 60 grams per meal to help with blood sugar control.   The need to take medication as prescribed, test blood sugar as directed, and to call between visits if there is a concern that blood sugar is uncontrolled is also discussed.   [name] is reminded of the importance of daily foot exam, annual eye examination, and good blood sugar, blood pressure and cholesterol control.  Diabetic Labs Latest Ref Rng & Units 02/20/2018 11/11/2017 07/13/2017 07/12/2017 12/23/2016  HbA1c <5.7 % of total Hgb 7.7(H) 7.6(H) - 7.1(H) 7.4(H)  Microalbumin Not Estab. ug/mL - - 17.9(H) - -  Micro/Creat Ratio 0.0 - 30.0 mg/g creat - - 22.9 - -  Chol <200 mg/dL - 189 - 197 -  HDL >50 mg/dL - 71 - 72 -  Calc LDL mg/dL (calc) - 102(H) - 109(H) -  Triglycerides <150 mg/dL - 71 - 69 -  Creatinine 0.50 - 0.99 mg/dL 0.64 0.65 - 0.74 0.58   BP/Weight 02/22/2018 12/26/2017 11/15/2017 07/13/2017 02/03/2017 12/27/2016 0/34/7425  Systolic BP 956 387 564 332 951 884 -  Diastolic BP 78 80 78 82 80 80 -  Wt. (Lbs) 147 146 144.12 145 145 146.12 146  BMI 24.46 24.3 23.98 24.89 24.89 25.08 25.06   Foot/eye exam completion dates Latest Ref Rng & Units 02/22/2018 07/13/2017  Eye Exam No Retinopathy - -  Foot exam Order - - -  Foot Form Completion - Done Done

## 2018-02-26 IMAGING — US US CAROTID DUPLEX BILAT
1 series · 13 of 24 positions shown · non-contrast
Comparison: None.

CLINICAL DATA: History of TIA, hypertension, hyperlipidemia and
diabetes.

EXAM:
BILATERAL CAROTID DUPLEX ULTRASOUND
TECHNIQUE: Gray scale imaging, color Doppler and duplex ultrasound were
performed of bilateral carotid and vertebral arteries in the neck.

[Series 1: us carotid duplex bilat · 0.06mm/px · 13 of 68 slices shown]
[im 1/68]
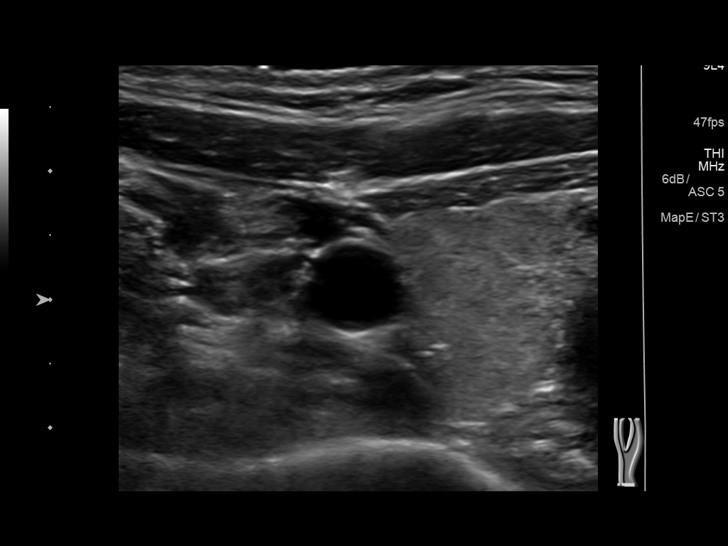
[im 6/68]
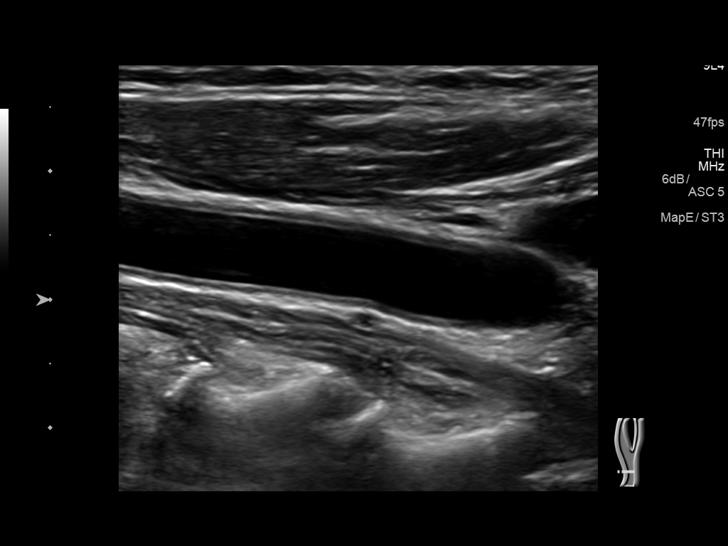
[im 12/68]
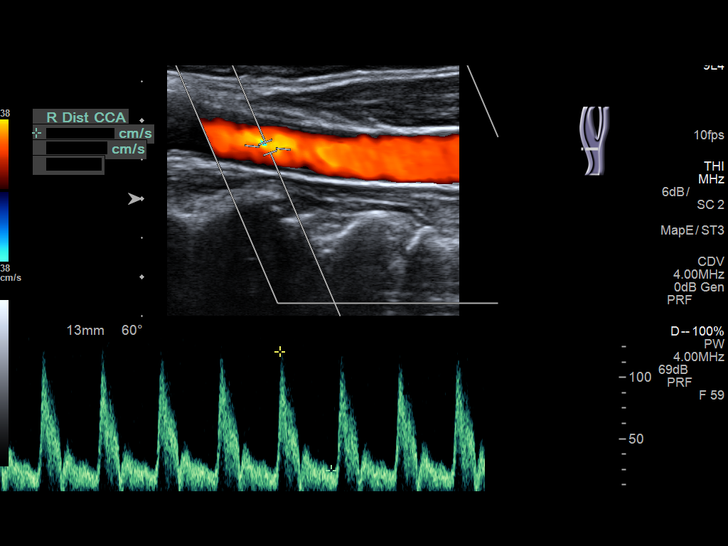
[im 18/68]
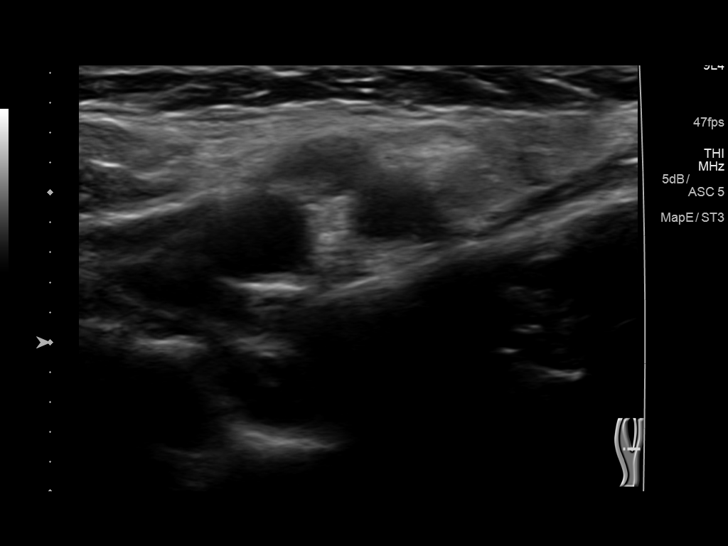
[im 24/68]
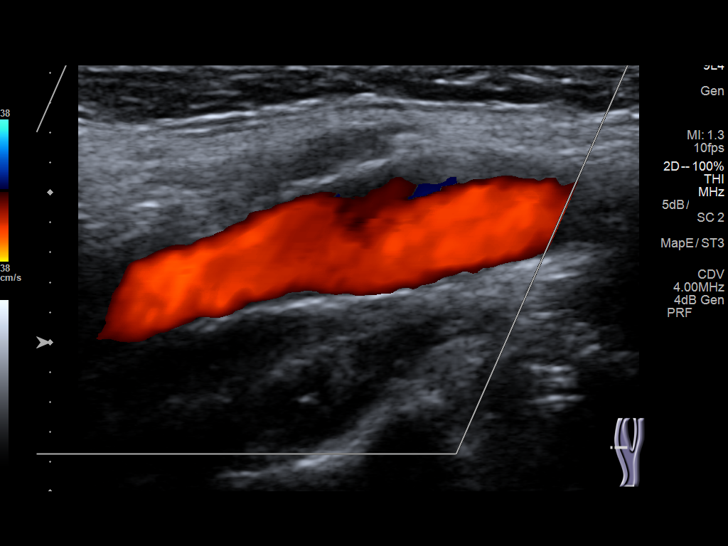
[im 30/68]
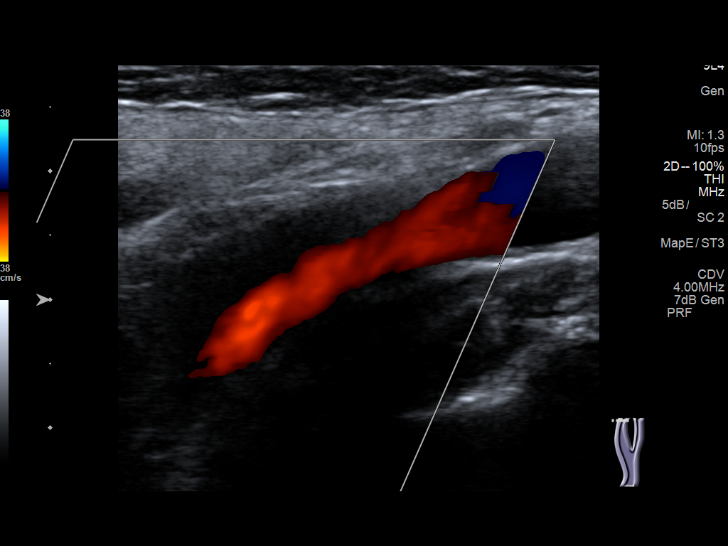
[im 35/68]
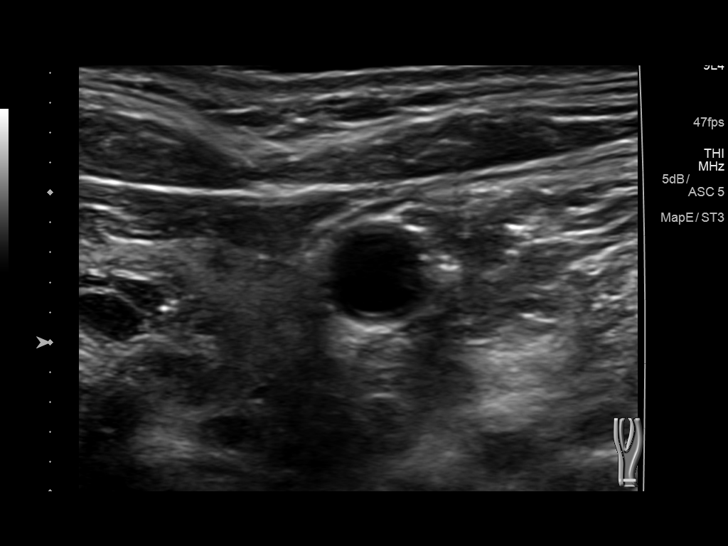
[im 38/68]
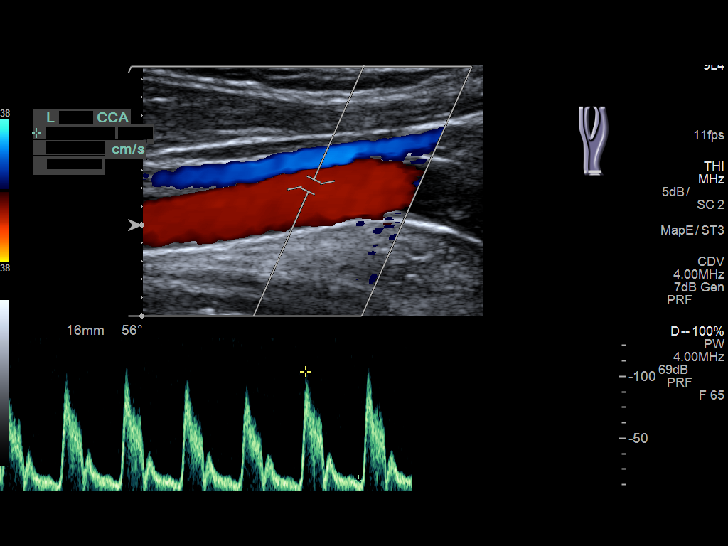
[im 44/68]
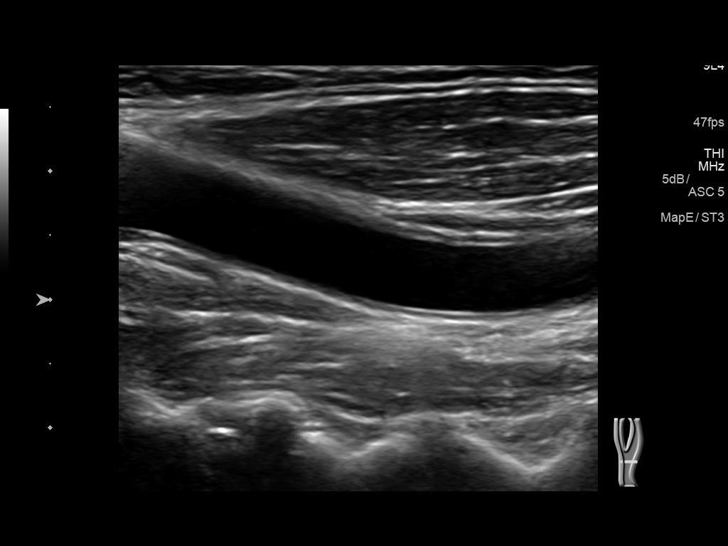
[im 50/68]
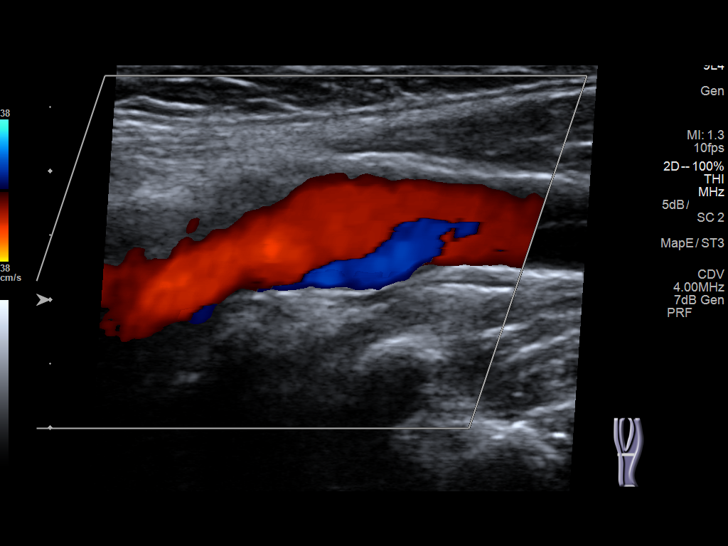
[im 56/68]
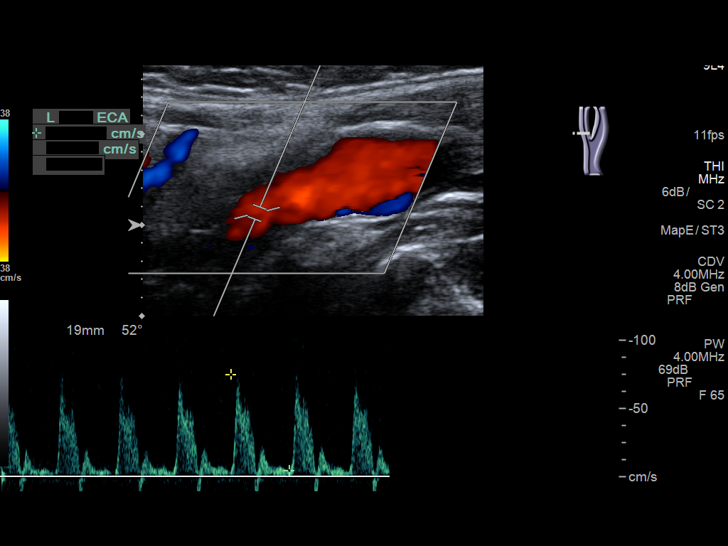
[im 62/68]
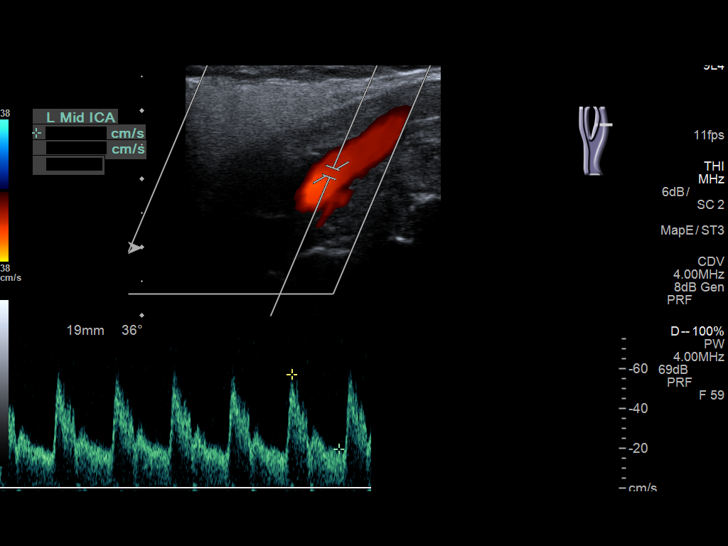
[im 68/68]
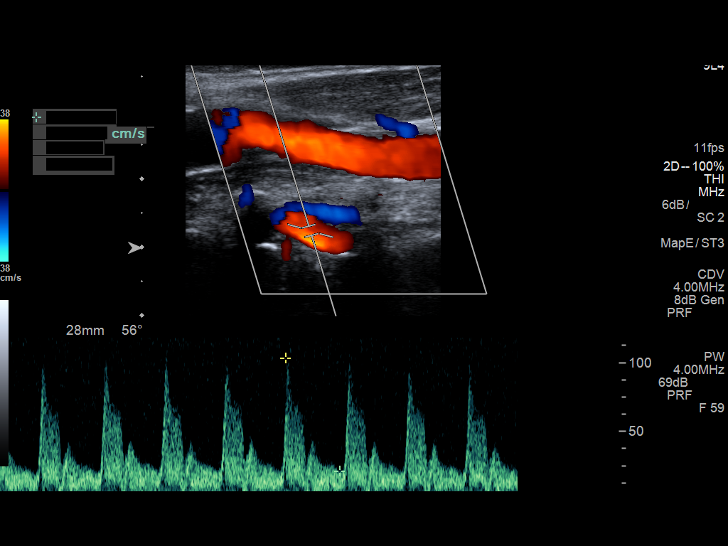

[13 of 24 positions shown; findings below may reference images not displayed]

FINDINGS: Criteria: Quantification of carotid stenosis is based on velocity
parameters that correlate the residual internal carotid diameter
with NASCET-based stenosis levels, using the diameter of the distal
internal carotid lumen as the denominator for stenosis measurement.

The following velocity measurements were obtained:

RIGHT

ICA:  89/24 cm/sec

CCA:  99/18 cm/sec

SYSTOLIC ICA/CCA RATIO:

DIASTOLIC ICA/CCA RATIO:

ECA:  88 cm/sec

LEFT

ICA:  75/20 cm/sec

CCA:  1 1/17 cm/sec

SYSTOLIC ICA/CCA RATIO:

DIASTOLIC ICA/CCA RATIO:

ECA:  75 cm/sec

RIGHT CAROTID ARTERY: There is no grayscale evidence of significant
intimal thickening or atherosclerotic plaque affecting interrogated
portions of the right carotid system. There are no elevated peak
systolic velocities within the interrogated course the right
internal carotid artery to suggest a hemodynamically significant
stenosis.

RIGHT VERTEBRAL ARTERY:  Antegrade Flow

LEFT CAROTID ARTERY: There is no grayscale evidence significant
intimal thickening or atherosclerotic plaque affecting the
interrogated portions of the left carotid system. There are no
elevated peak systolic velocities within the interrogated course of
the left internal carotid artery to suggest a hemodynamically
significant stenosis.

LEFT VERTEBRAL ARTERY:  Antegrade Flow
IMPRESSION: Unremarkable carotid Doppler ultrasound.

## 2018-02-27 ENCOUNTER — Encounter (INDEPENDENT_AMBULATORY_CARE_PROVIDER_SITE_OTHER): Payer: Self-pay | Admitting: *Deleted

## 2018-03-15 ENCOUNTER — Other Ambulatory Visit (INDEPENDENT_AMBULATORY_CARE_PROVIDER_SITE_OTHER): Payer: Self-pay | Admitting: *Deleted

## 2018-03-15 DIAGNOSIS — Z1211 Encounter for screening for malignant neoplasm of colon: Secondary | ICD-10-CM | POA: Insufficient documentation

## 2018-03-28 ENCOUNTER — Other Ambulatory Visit: Payer: Self-pay | Admitting: Urology

## 2018-03-28 DIAGNOSIS — N3000 Acute cystitis without hematuria: Secondary | ICD-10-CM | POA: Diagnosis not present

## 2018-03-28 DIAGNOSIS — N200 Calculus of kidney: Secondary | ICD-10-CM | POA: Diagnosis not present

## 2018-03-28 DIAGNOSIS — K8020 Calculus of gallbladder without cholecystitis without obstruction: Secondary | ICD-10-CM | POA: Diagnosis not present

## 2018-03-28 DIAGNOSIS — N3001 Acute cystitis with hematuria: Secondary | ICD-10-CM | POA: Diagnosis not present

## 2018-03-29 ENCOUNTER — Encounter (HOSPITAL_COMMUNITY): Payer: Self-pay | Admitting: General Practice

## 2018-04-03 ENCOUNTER — Ambulatory Visit (HOSPITAL_COMMUNITY): Admission: RE | Admit: 2018-04-03 | Payer: Medicare Other | Source: Other Acute Inpatient Hospital | Admitting: Urology

## 2018-04-03 HISTORY — DX: Personal history of urinary calculi: Z87.442

## 2018-04-03 SURGERY — LITHOTRIPSY, ESWL
Anesthesia: LOCAL | Laterality: Right

## 2018-04-03 NOTE — Progress Notes (Signed)
Pt not arrived for scheduled procedure time; secretary Vaughan Basta contacted pt whom stated she did not have a ride this am and would not be able to follow through with having procedure as scheduled. [employer] truck notified.

## 2018-04-12 ENCOUNTER — Telehealth: Payer: Self-pay | Admitting: Family Medicine

## 2018-04-12 NOTE — Telephone Encounter (Signed)
Follow up to medication, pt wants Velna Hatchet to call her [phone]

## 2018-04-12 NOTE — Telephone Encounter (Signed)
Called patient and left message for them to return call at the office   

## 2018-04-15 ENCOUNTER — Other Ambulatory Visit: Payer: Self-pay | Admitting: Family Medicine

## 2018-05-02 DIAGNOSIS — N200 Calculus of kidney: Secondary | ICD-10-CM | POA: Diagnosis not present

## 2018-05-02 DIAGNOSIS — R310 Gross hematuria: Secondary | ICD-10-CM | POA: Diagnosis not present

## 2018-05-02 DIAGNOSIS — N3000 Acute cystitis without hematuria: Secondary | ICD-10-CM | POA: Diagnosis not present

## 2018-05-03 IMAGING — MR MR HEAD W/O CM
6 of 10 series · 29 of 48 positions shown · non-contrast
Comparison: Head CT without contrast 06/19/2016. Cervical spine MRI
03/26/2010

CLINICAL DATA: 66-year-old female with weakness and slurred speech.
Initial encounter.

EXAM:
MRI HEAD WITHOUT CONTRAST
TECHNIQUE: Multiplanar, multiecho pulse sequences of the brain and surrounding
structures were obtained without intravenous contrast.

[Series 1: DWI · axial · 3.0mm · 0.73mm/px · z∈[-99,+63]mm · 5 of 51 slices shown (1 of 4)]
[im 1/51]
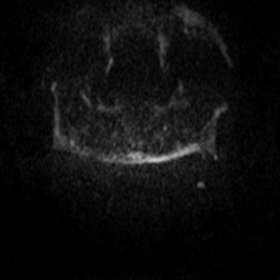
[im 13/51]
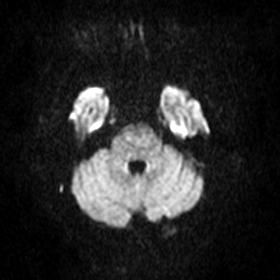
[im 26/51]
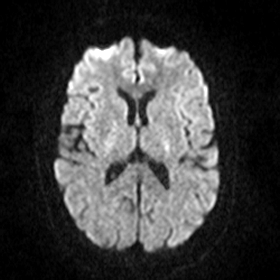
[im 38/51]
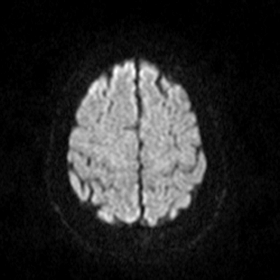
[im 51/51]
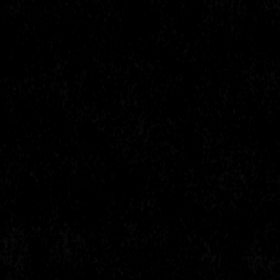

[Series 2: DWI · axial · 3.0mm · 0.73mm/px · z∈[-99,+60]mm · 7 of 54 slices shown (2 of 4)]
[im 1/54]
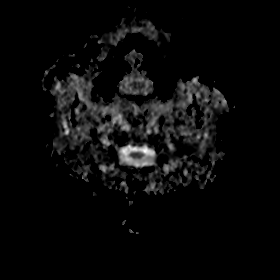
[im 9/54]
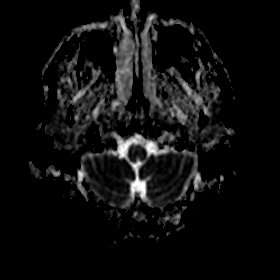
[im 18/54]
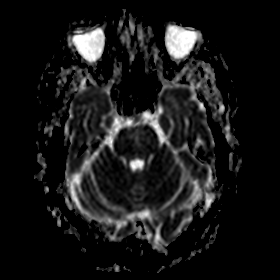
[im 27/54]
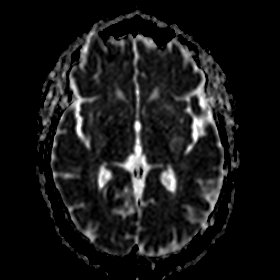
[im 36/54]
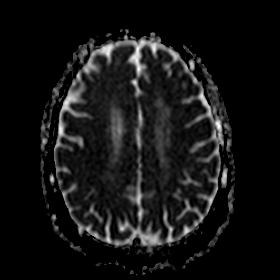
[im 45/54]
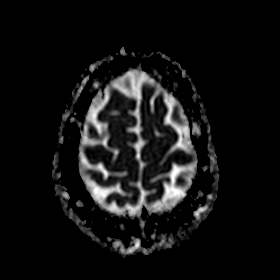
[im 54/54]
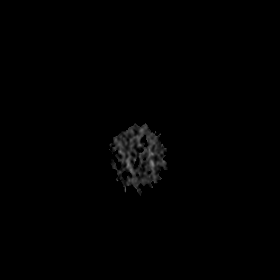

[Series 3: DWI · coronal · 5.0mm · 0.49mm/px · 4 of 36 slices shown (3 of 4)]
[im 1/36]
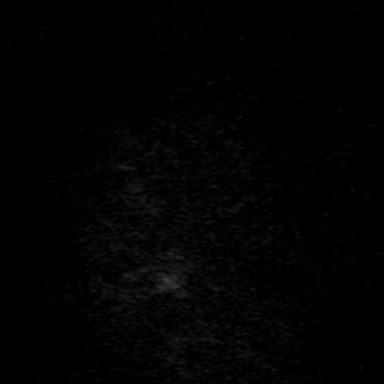
[im 12/36]
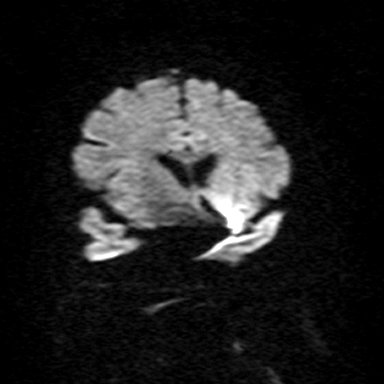
[im 24/36]
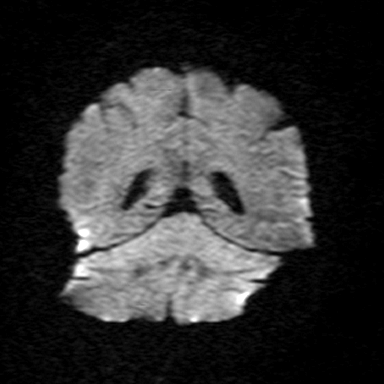
[im 36/36]
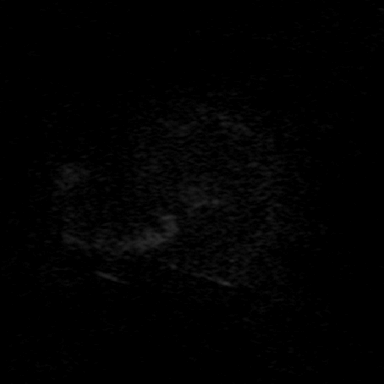

[Series 4: DWI · coronal · 5.0mm · 0.49mm/px · 4 of 36 slices shown (4 of 4)]
[im 1/36]
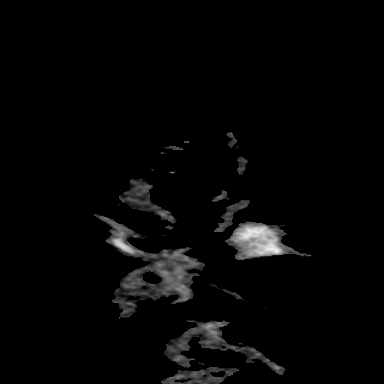
[im 12/36]
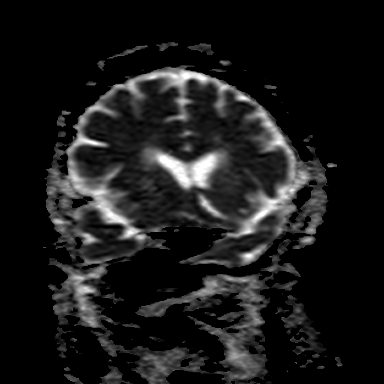
[im 24/36]
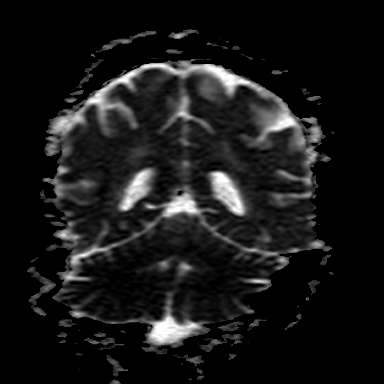
[im 36/36]
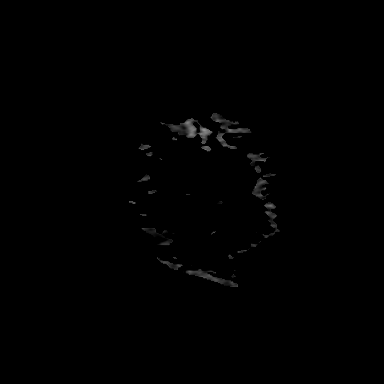

[Series 6: T2 · axial · 5.0mm · 0.50mm/px · z∈[-92,+51]mm · 3 of 23 slices shown]
[im 1/23]
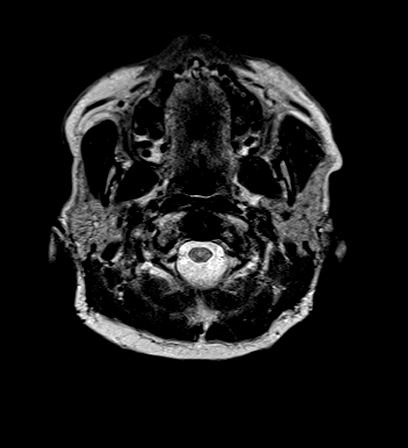
[im 12/23]
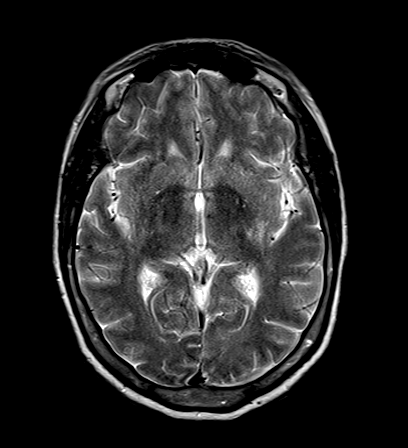
[im 23/23]
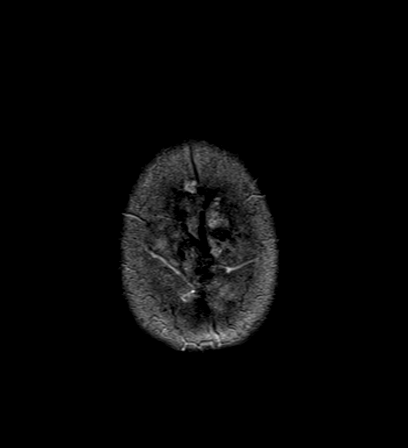

[Series 7: FLAIR · axial · 3.0mm · 0.33mm/px · z∈[-89,+49]mm · 6 of 47 slices shown]
[im 1/47]
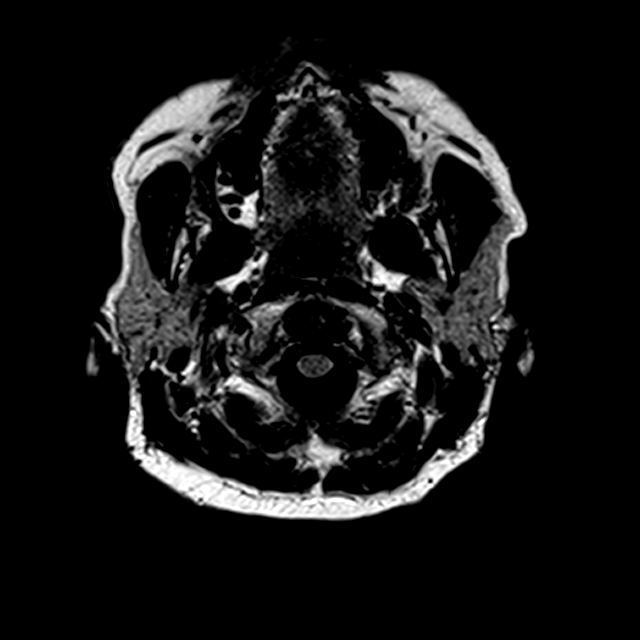
[im 10/47]
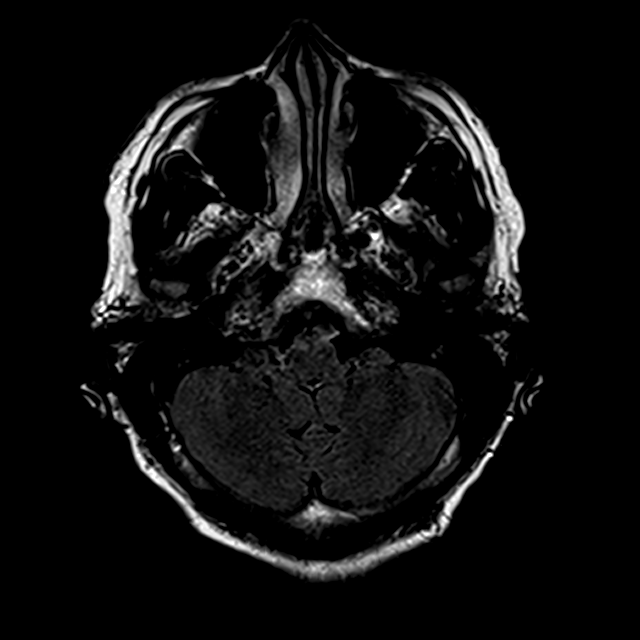
[im 19/47]
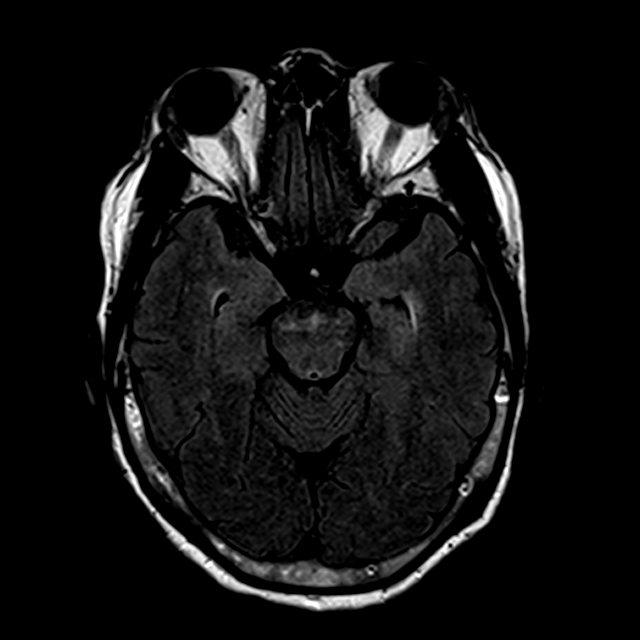
[im 28/47]
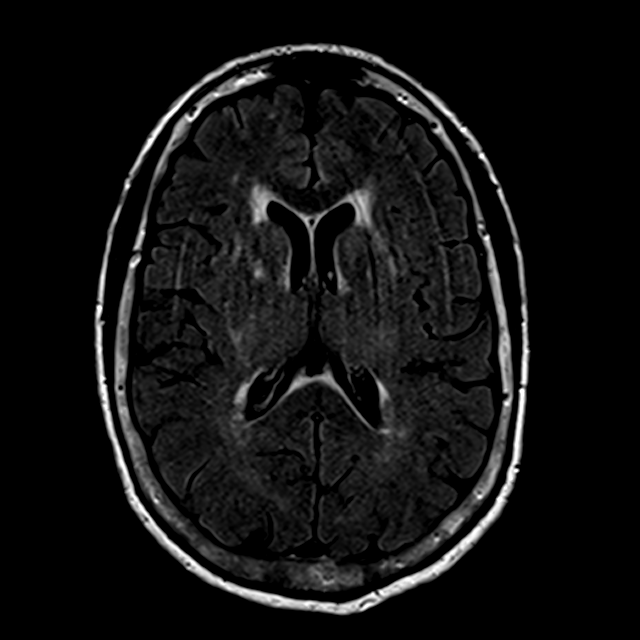
[im 37/47]
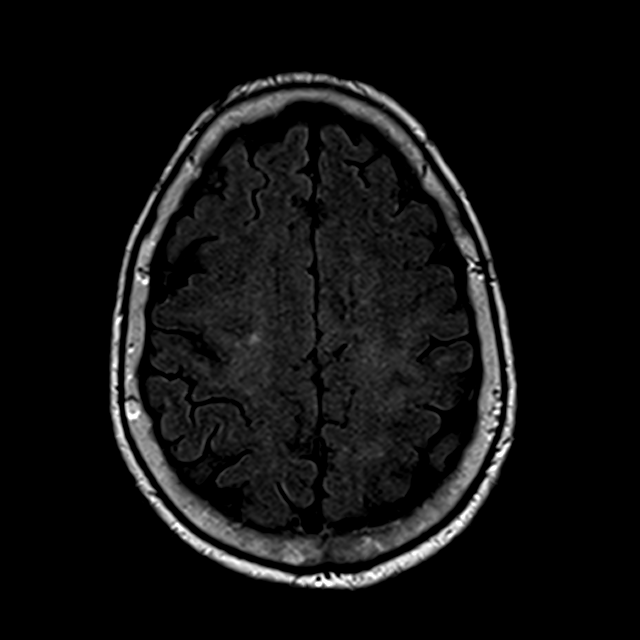
[im 47/47]
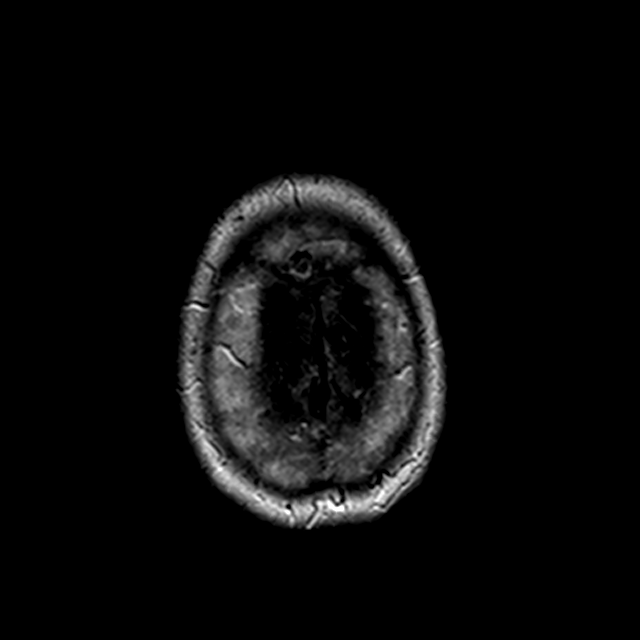

[29 of 48 positions shown; findings below may reference images not displayed]

FINDINGS: Brain: No restricted diffusion to suggest acute infarction. No
midline shift, mass effect, evidence of mass lesion,
ventriculomegaly, extra-axial collection or acute intracranial
hemorrhage. Cervicomedullary junction and pituitary are within
normal limits.

Cerebral volume is within normal limits for age. Patchy and
confluent bilateral cerebral white matter T2 and FLAIR
hyperintensity with some involvement of the deep white matter
capsules. T2 heterogeneity throughout the bilateral deep gray matter
nuclei, especially in the right thalamus. Confluent T2 heterogeneity
in the pons. No cortical encephalomalacia. There is a small chronic
microhemorrhage in the left superior parietal lobe (series 9, image
18). No other chronic cerebral blood products identified. Negative
cerebellum.

Vascular: Major intracranial vascular flow voids are preserved.

Skull and upper cervical spine: Negative. Normal bone marrow signal.

Sinuses/Orbits: Negative orbit soft tissues.

Visualized paranasal sinuses and mastoids are stable and well
pneumatized.

Other: Visible internal auditory structures appear normal. Negative
scalp soft tissues.
IMPRESSION: 1.  No acute intracranial abnormality.
2. Advanced signal changes in the cerebral white matter, the deep
gray matter nuclei, and pons are nonspecific but favored to reflect
advanced chronic small vessel disease.

## 2018-05-09 ENCOUNTER — Encounter (INDEPENDENT_AMBULATORY_CARE_PROVIDER_SITE_OTHER): Payer: Self-pay | Admitting: *Deleted

## 2018-05-09 ENCOUNTER — Telehealth (INDEPENDENT_AMBULATORY_CARE_PROVIDER_SITE_OTHER): Payer: Self-pay | Admitting: *Deleted

## 2018-05-09 ENCOUNTER — Encounter: Payer: Self-pay | Admitting: *Deleted

## 2018-05-09 NOTE — Telephone Encounter (Signed)
Patient needs trilyte 

## 2018-05-10 MED ORDER — PEG 3350-KCL-NA BICARB-NACL 420 G PO SOLR: 4000.0000 mL | Freq: Once | ORAL | 0 refills | Status: AC

## 2018-05-14 ENCOUNTER — Other Ambulatory Visit: Payer: Self-pay | Admitting: Family Medicine

## 2018-05-18 DIAGNOSIS — N3000 Acute cystitis without hematuria: Secondary | ICD-10-CM | POA: Diagnosis not present

## 2018-05-22 ENCOUNTER — Other Ambulatory Visit: Payer: Self-pay | Admitting: Urology

## 2018-05-23 ENCOUNTER — Other Ambulatory Visit: Payer: Self-pay | Admitting: Family Medicine

## 2018-05-24 ENCOUNTER — Encounter (HOSPITAL_COMMUNITY): Payer: Self-pay | Admitting: General Practice

## 2018-05-26 NOTE — H&P (Signed)
[name] is a 68 year-old female with a right renal pelvis stone.  05/05/2016: She underwent CT for gross hematuria which revelaed a 1.5cm right upj calculus and a 77mm right lower pole calculus   12/30/2016: Reanl US shows left mid pole calculus which is stable at 16mm. NO stone events since last visit.   06/30/2017: She passed a stone 3 weeks ago on her left side. she denies any flank pain currently. No worsening LUTS. She was treated for a UTI 2 weeks ago.   09/26/2017: No stone events since last visit. Renal US shows bilateral renal calculi. 26mm on right, 48mm on left. She stopped the sodium bicarb   03/28/2018: For over 2 weeks she has had bilateral flank pain. Renal US shows bilateral hydronephrosis and a 1.4cm right UPJ calculus. UA is concerning for infection. CT showed a right renal pelvis calculus.   05/02/2018: She was scheduled for right sided lithotripsy on January 27th but did not show and when contacted stated she did not have a ride therefore the procedure was canceled. She's had some intermittent right sided flank pain, describes as not severe or radiating.   The problem is on both sides. She first stated noticing pain on approximately 03/08/2016. This is not her first kidney stone. Her first stone was approximately 04/09/2011. She has had 1 stones prior to getting this one. She is not currently having flank pain, back pain, groin pain, nausea, vomiting, fever or chills. She has not caught a stone in her urine strainer since her symptoms began.   She has had ureteral stent, ureteroscopy, and percutaneous lithotripsy for treatment of her stones in the past.     CC: I have acute cystitis.  HPI: 12/30/2016: 1 week ago she developed low back pain and suprapubic pain. No flank pain. UA today is concerning for infection.   03/28/2018: for the past 2 weeks she was having dysuria and worsening urinary urgency.   05/02/2018: Urine c/s at last visit positive for 15k colony enterococcus.  She had been empirically started on Bactrim but then this was changed to Macrobid based off culture sensitivity.   Reports increased frequency/urgency over the past several days. Also associated with painful/burning urination specifically at the end of urine stream. She denies gross hematuria. She had severe or constant right-sided pain/discomfort. Denies any interval stent material passage. Denies fevers/chills, nausea/vomiting.   She does have a burning sensation when she urinates. She does have pelvic or rectal pain related to voiding. She does not have recurrent infections.   She is not having problems with urinary control or incontinence. She is not incontinent immediately following the strong urge to urinate. She is urinating more frequently now than usual.     ALLERGIES: Ace Inhibitors Onglyza TABS    MEDICATIONS: Aspirin  Metformin Hcl 1,000 mg tablet  Amlodipine Besilate  Atorvastatin Calcium 10 mg tablet  Calcium  Glipizide-Metformin  Lantus Solostar 100 unit/ml (3 ml) insulin pen  Multivitamin  Sodium Bicarbonate 650 mg tablet 1 tablet PO BID     GU PSH: Hysterectomy Percut Stone Removal >2cm - 05/31/2016      PSH Notes: kidney stone surgery  Removal of cyst above wrist   NON-GU PSH: None   GU PMH: Hemorrhagic cystitis - 03/28/2018, - 12/30/2016 Renal calculus - 03/28/2018, - 09/26/2017, - 06/30/2017, - 12/30/2016, - 05/05/2016 Ureteral calculus - 12/30/2016, - 05/05/2016 Renal and ureteral calculus - 2018 Gross hematuria - 2018    NON-GU PMH: Arrhythmia Arthritis Diabetes Type 2  Gastric ulcer, unspecified as acute or chronic, without hemorrhage or perforation GERD Hypercholesterolemia Hypertension    FAMILY HISTORY: Death - Father, Mother Diabetes - Sister Myocardial Infarction - Sister   SOCIAL HISTORY: Marital Status: Single Preferred Language: English; Ethnicity: Not Hispanic Or Latino; Race: Black or African American Current Smoking Status: Patient has  never smoked.   Tobacco Use Assessment Completed: Used Tobacco in last 30 days? Has never drank.  Does not drink caffeine. 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     Notes: 1 son    REVIEW OF SYSTEMS:    GU Review Female:   Patient reports frequent urination, burning /pain with urination, get up at night to urinate, and leakage of urine. Patient denies hard to postpone urination, stream starts and stops, trouble starting your stream, have to strain to urinate, and being pregnant.  Gastrointestinal (Upper):   Patient denies nausea, vomiting, and indigestion/ heartburn.  Gastrointestinal (Lower):   Patient denies diarrhea and constipation.  Constitutional:   Patient denies fever, night sweats, weight loss, and fatigue.  Skin:   Patient denies skin rash/ lesion and itching.  Eyes:   Patient reports blurred vision. Patient denies double vision.  Ears/ Nose/ Throat:   Patient denies sore throat and sinus problems.  Hematologic/Lymphatic:   Patient denies swollen glands and easy bruising.  Cardiovascular:   Patient denies leg swelling and chest pains.  Respiratory:   Patient denies cough and shortness of breath.  Endocrine:   Patient denies excessive thirst.  Musculoskeletal:   Patient denies back pain and joint pain.  Neurological:   Patient denies headaches and dizziness.  Psychologic:   Patient denies depression and anxiety.   VITAL SIGNS:    BP 134/74 mmHg  Pulse 110 /min  Pulse Oximetry 97 %  Temperature 98.5 F / 36.9 C   MULTI-SYSTEM PHYSICAL EXAMINATION:    Constitutional: Well-nourished. No physical deformities. Normally developed. Good grooming.  Neck: Neck symmetrical, not swollen. Normal tracheal position.  Respiratory: No labored breathing, no use of accessory muscles.   Cardiovascular: Normal temperature, normal extremity pulses, no swelling, no varicosities.  Skin: No paleness, no jaundice, no cyanosis. No lesion, no ulcer, no rash.  Neurologic / Psychiatric:  Oriented to time, oriented to place, oriented to person. No depression, no anxiety, no agitation.  Gastrointestinal: No mass, no tenderness, no rigidity, non obese abdomen.  Musculoskeletal: Normal gait and station of head and neck.     PAST DATA REVIEWED:  Source Of History:  Patient  Records Review:   Previous Patient Records  Urine Test Review:   Urinalysis, Urine Culture  X-Ray Review: [facility] Protocol: Reviewed Films. Reviewed Report.      PROCEDURES:         KUB - [id number]  A single view of the abdomen is obtained. Nonobstructing calculi observed within the anatomical expected confines of the mid and upper pole regions of the left kidney. Anatomical expected tract of the left kidney appeared clear. Both renal shadows as well as anatomical expected courses of the proximal ureters are grossly obscured by a prominent overlying bowel gas and stool pattern. I can identify a lower pole calculi nonobstructing on the right. There is a faint ovoid-shaped opacity noted on the right between L2/L3 which correlates loosely with the previously identified right renal collecting system/UPJ calculus. An arrow will be drawn for better identification. The remaining anatomical expected tract of the right ureter grossly appears clear. Stable pelvic opacities identified. Bladder is grossly obscured by bowel and  stool.           Urinalysis w/Scope Dipstick Dipstick Cont'd Micro  Color: Amber Bilirubin: Neg mg/dL WBC/hpf: 40 - 60/hpf  Appearance: Turbid Ketones: Trace mg/dL RBC/hpf: >60/hpf  Specific Gravity: 1.020 Blood: 3+ ery/uL Bacteria: Few (10-25/hpf)  pH: 5.5 Protein: 3+ mg/dL Cystals: NS (Not Seen)  Glucose: 2+ mg/dL Urobilinogen: 0.2 mg/dL Casts: NS (Not Seen)    Nitrites: Neg Trichomonas: Not Present    Leukocyte Esterase: 3+ leu/uL Mucous: Not Present      Epithelial Cells: 0 - 5/hpf      Yeast: NS (Not Seen)      Sperm: Not Present    ASSESSMENT/PLAN:     ICD-10 Details  1 GU:   Renal  calculus - Begin empirical cipro however his urine culture returned negative.  I can grossly make out the renal collecting system/UPJ stone on KUB today but exam is limited due to overlying bowel gas and stool.  It was Dr. Alyson Ingles is feeling that the stone could likely be visualized and therefore the patient has been scheduled for lithotripsy.

## 2018-05-26 NOTE — Discharge Instructions (Signed)

## 2018-05-29 ENCOUNTER — Encounter (HOSPITAL_COMMUNITY)
Admission: RE | Disposition: A | Discharge status: Home / Self Care | Payer: Self-pay | Source: Home / Self Care | Attending: Urology

## 2018-05-29 ENCOUNTER — Ambulatory Visit (HOSPITAL_COMMUNITY): Payer: Medicare Other

## 2018-05-29 ENCOUNTER — Encounter (HOSPITAL_COMMUNITY): Payer: Self-pay | Admitting: Emergency Medicine

## 2018-05-29 ENCOUNTER — Other Ambulatory Visit: Payer: Self-pay

## 2018-05-29 ENCOUNTER — Ambulatory Visit (HOSPITAL_COMMUNITY)
Admission: RE | Admit: 2018-05-29 | Discharge: 2018-05-29 | Disposition: A | Discharge status: Home / Self Care | Payer: Medicare Other | Attending: Urology | Admitting: Urology

## 2018-05-29 DIAGNOSIS — I10 Essential (primary) hypertension: Secondary | ICD-10-CM | POA: Diagnosis not present

## 2018-05-29 DIAGNOSIS — N200 Calculus of kidney: Secondary | ICD-10-CM | POA: Insufficient documentation

## 2018-05-29 DIAGNOSIS — E119 Type 2 diabetes mellitus without complications: Secondary | ICD-10-CM | POA: Insufficient documentation

## 2018-05-29 HISTORY — DX: Cardiac arrhythmia, unspecified: I49.9

## 2018-05-29 HISTORY — PX: EXTRACORPOREAL SHOCK WAVE LITHOTRIPSY: SHX1557

## 2018-05-29 LAB — GLUCOSE, CAPILLARY: Glucose-Capillary: 144 mg/dL — ABNORMAL HIGH (ref 70–99)

## 2018-05-29 SURGERY — LITHOTRIPSY, ESWL
Anesthesia: LOCAL | Laterality: Right

## 2018-05-29 MED ORDER — DIAZEPAM 5 MG PO TABS
10.0000 mg | ORAL_TABLET | ORAL | Status: AC
  Administered 2018-05-29: 10 mg via ORAL
  Filled 2018-05-29: qty 2

## 2018-05-29 MED ORDER — SODIUM CHLORIDE 0.9 % IV SOLN
INTRAVENOUS | Status: DC
  Administered 2018-05-29: 08:00:00 via INTRAVENOUS

## 2018-05-29 MED ORDER — DIPHENHYDRAMINE HCL 25 MG PO CAPS
25.0000 mg | ORAL_CAPSULE | ORAL | Status: AC
  Administered 2018-05-29: 25 mg via ORAL
  Filled 2018-05-29: qty 1

## 2018-05-29 MED ORDER — TAMSULOSIN HCL 0.4 MG PO CAPS: 0.4000 mg | ORAL_CAPSULE | ORAL | 0 refills | Status: DC

## 2018-05-29 MED ORDER — HYDROCODONE-ACETAMINOPHEN 10-325 MG PO TABS: 1.0000 | ORAL_TABLET | ORAL | 0 refills | Status: DC | PRN

## 2018-05-29 MED ORDER — CIPROFLOXACIN HCL 500 MG PO TABS
500.0000 mg | ORAL_TABLET | ORAL | Status: AC
  Administered 2018-05-29: 500 mg via ORAL
  Filled 2018-05-29: qty 1

## 2018-05-29 NOTE — Op Note (Signed)
See [facility] OP note scanned into chart. Also because of the size, density, location and other factors that cannot be anticipated I feel this will likely be a staged procedure. This fact supersedes any indication in the scanned [facility] operative note to the contrary.  

## 2018-05-30 ENCOUNTER — Encounter (HOSPITAL_COMMUNITY): Payer: Self-pay | Admitting: Urology

## 2018-06-01 ENCOUNTER — Ambulatory Visit: Payer: Medicare Other | Admitting: Family Medicine

## 2018-06-12 ENCOUNTER — Other Ambulatory Visit: Payer: Self-pay

## 2018-06-12 ENCOUNTER — Telehealth: Payer: Self-pay | Admitting: *Deleted

## 2018-06-12 MED ORDER — AMLODIPINE BESYLATE 5 MG PO TABS: 5.0000 mg | ORAL_TABLET | Freq: Every day | ORAL | 1 refills | Status: DC

## 2018-06-12 NOTE — Telephone Encounter (Signed)
90day supply sent.

## 2018-06-12 NOTE — Telephone Encounter (Signed)
Pt called wanting a 90 day prescription for her amlodopine 5 mg she has 30 day supply just wants it changed to 90 day supply. She uses [facility] in [place]

## 2018-06-27 ENCOUNTER — Ambulatory Visit: Payer: Medicare Other | Admitting: Family Medicine

## 2018-06-27 DIAGNOSIS — N3000 Acute cystitis without hematuria: Secondary | ICD-10-CM | POA: Diagnosis not present

## 2018-07-11 ENCOUNTER — Ambulatory Visit: Payer: Medicare Other | Admitting: Family Medicine

## 2018-07-13 ENCOUNTER — Ambulatory Visit (INDEPENDENT_AMBULATORY_CARE_PROVIDER_SITE_OTHER): Payer: Medicare Other | Admitting: Family Medicine

## 2018-07-13 ENCOUNTER — Encounter: Payer: Self-pay | Admitting: Family Medicine

## 2018-07-13 VITALS — BP 128/78 | Ht 64.0 in | Wt 145.0 lb

## 2018-07-13 DIAGNOSIS — E785 Hyperlipidemia, unspecified: Secondary | ICD-10-CM

## 2018-07-13 DIAGNOSIS — R21 Rash and other nonspecific skin eruption: Secondary | ICD-10-CM

## 2018-07-13 DIAGNOSIS — I10 Essential (primary) hypertension: Secondary | ICD-10-CM

## 2018-07-13 DIAGNOSIS — E1169 Type 2 diabetes mellitus with other specified complication: Secondary | ICD-10-CM | POA: Diagnosis not present

## 2018-07-13 DIAGNOSIS — Z794 Long term (current) use of insulin: Secondary | ICD-10-CM | POA: Diagnosis not present

## 2018-07-13 MED ORDER — NYSTATIN 100000 UNIT/GM EX OINT: 1.0000 "application " | TOPICAL_OINTMENT | Freq: Two times a day (BID) | CUTANEOUS | 0 refills | Status: DC

## 2018-07-13 NOTE — Patient Instructions (Addendum)
Thank you for completing your visit via telemedicine today. I appreciate the opportunity to provide you with the care for your health and wellness. Today we discussed: hypertension, diabetes, cholesterol, itchy rash  I have sent in a nystatin ointment that she can apply twice daily to the rash being itchy areas.  Hopefully this will help with some of the itching and discomfort that you have experienced.  Continue to drink good water to help with your kidneys and bladder.  Hopefully your UTIs will subside and you are not having any other issues with it.  Please get your lab work in the next week we will adjust your medications pending those results.  Please continue to practice social distancing during this time to help keep your community and you safe and healthy.  [place] YOUR HANDS WELL AND FREQUENTLY. AVOID TOUCHING YOUR FACE, UNLESS YOUR HANDS ARE FRESHLY WASHED.  GET FRESH AIR DAILY. STAY HYDRATED WITH WATER.   It was a pleasure to see you and I look forward to continuing to work together on your health and well-being. Please do not hesitate to call the office if you need care or have questions about your care.  Have a wonderful day and week. With Gratitude, Cherly Beach, DNP, AGNP-BC   Carbohydrate Counting for Diabetes Mellitus, Adult  Carbohydrate counting is a method of keeping track of how many carbohydrates you eat. Eating carbohydrates naturally increases the amount of sugar (glucose) in the blood. Counting how many carbohydrates you eat helps keep your blood glucose within normal limits, which helps you manage your diabetes (diabetes mellitus). It is important to know how many carbohydrates you can safely have in each meal. This is different for every person. A diet and nutrition specialist (registered dietitian) can help you make a meal plan and calculate how many carbohydrates you should have at each meal and snack. Carbohydrates are found in the following foods:   Grains, such as breads and cereals.  Dried beans and soy products.  Starchy vegetables, such as potatoes, peas, and corn.  Fruit and fruit juices.  Milk and yogurt.  Sweets and snack foods, such as cake, cookies, candy, chips, and soft drinks. How do I count carbohydrates? There are two ways to count carbohydrates in food. You can use either of the methods or a combination of both. Reading "Nutrition Facts" on packaged food The "Nutrition Facts" list is included on the labels of almost all packaged foods and beverages in the [place] It includes:  The serving size.  Information about nutrients in each serving, including the grams (g) of carbohydrate per serving. To use the "Nutrition Facts":  Decide how many servings you will have.  Multiply the number of servings by the number of carbohydrates per serving.  The resulting number is the total amount of carbohydrates that you will be having. Learning standard serving sizes of other foods When you eat carbohydrate foods that are not packaged or do not include "Nutrition Facts" on the label, you need to measure the servings in order to count the amount of carbohydrates:  Measure the foods that you will eat with a food scale or measuring cup, if needed.  Decide how many standard-size servings you will eat.  Multiply the number of servings by 15. Most carbohydrate-rich foods have about 15 g of carbohydrates per serving. ? For example, if you eat 8 oz (170 g) of strawberries, you will have eaten 2 servings and 30 g of carbohydrates (2 servings x 15 g =  30 g).  For foods that have more than one food mixed, such as soups and casseroles, you must count the carbohydrates in each food that is included. The following list contains standard serving sizes of common carbohydrate-rich foods. Each of these servings has about 15 g of carbohydrates:   hamburger bun or  English muffin.   oz (15 mL) syrup.   oz (14 g) jelly.  1 slice of bread.   1 six-inch tortilla.  3 oz (85 g) cooked rice or pasta.  4 oz (113 g) cooked dried beans.  4 oz (113 g) starchy vegetable, such as peas, corn, or potatoes.  4 oz (113 g) hot cereal.  4 oz (113 g) mashed potatoes or  of a large baked potato.  4 oz (113 g) canned or frozen fruit.  4 oz (120 mL) fruit juice.  4-6 crackers.  6 chicken nuggets.  6 oz (170 g) unsweetened dry cereal.  6 oz (170 g) plain fat-free yogurt or yogurt sweetened with artificial sweeteners.  8 oz (240 mL) milk.  8 oz (170 g) fresh fruit or one small piece of fruit.  24 oz (680 g) popped popcorn. Example of carbohydrate counting Sample meal  3 oz (85 g) chicken breast.  6 oz (170 g) brown rice.  4 oz (113 g) corn.  8 oz (240 mL) milk.  8 oz (170 g) strawberries with sugar-free whipped topping. Carbohydrate calculation 1. Identify the foods that contain carbohydrates: ? Rice. ? Corn. ? Milk. ? Strawberries. 2. Calculate how many servings you have of each food: ? 2 servings rice. ? 1 serving corn. ? 1 serving milk. ? 1 serving strawberries. 3. Multiply each number of servings by 15 g: ? 2 servings rice x 15 g = 30 g. ? 1 serving corn x 15 g = 15 g. ? 1 serving milk x 15 g = 15 g. ? 1 serving strawberries x 15 g = 15 g. 4. Add together all of the amounts to find the total grams of carbohydrates eaten: ? 30 g + 15 g + 15 g + 15 g = 75 g of carbohydrates total. Summary  Carbohydrate counting is a method of keeping track of how many carbohydrates you eat.  Eating carbohydrates naturally increases the amount of sugar (glucose) in the blood.  Counting how many carbohydrates you eat helps keep your blood glucose within normal limits, which helps you manage your diabetes.  A diet and nutrition specialist (registered dietitian) can help you make a meal plan and calculate how many carbohydrates you should have at each meal and snack. This information is not intended to replace advice given  to you by your health care provider. Make sure you discuss any questions you have with your health care provider. Document Released: 02/22/2005 Document Revised: 09/01/2016 Document Reviewed: 08/06/2015 Elsevier Interactive Patient Education  [employer]. addition of the foot

## 2018-07-13 NOTE — Progress Notes (Signed)
Virtual Visit via Telephone Note   This visit type was conducted due to national recommendations for restrictions regarding the COVID-19 Pandemic (e.g. social distancing) in an effort to limit this patient's exposure and mitigate transmission in our community.  Due to her co-morbid illnesses, this patient is at least at moderate risk for complications without adequate follow up.  This format is felt to be most appropriate for this patient at this time.  The patient did not have access to video technology/had technical difficulties with video requiring transitioning to audio format only (telephone).  All issues noted in this document were discussed and addressed.  No physical exam could be performed with this format.    Evaluation Performed:  Follow-up visit  Date:  07/13/2018   ID:  [name], DOB [birth year], MRN [id number]  Patient Location: Home Provider Location: Other:  telemedicine  Location of Patient: Home Location of Provider: Telehealth Consent was obtain for visit to be over via telehealth. I verified that I am speaking with the correct person using two identifiers.  PCP:  Fayrene Helper, MD   Chief Complaint:  Follow up on chronic condition  History of Present Illness:    [name] is a 68 y.o. female patient of Dr Simpson's. With history of hypertension, diabetes, hyperlipidemia, most recently frequent UTIs being followed by urology.  Reports taking her medications as directed but occasionally forgetting to take her diabetes medication.  Additionally forgot to get her labs that were due for this appointment.  But overall has no complaints today during the telemedicine visit.  Hypertension: Reports taking her medicines without trouble.  Denies having any vision changes though she needs to have her vision checked denies having any consistent headaches outside of her normal headaches denies having any chest pain, chest tightness, palpitations, leg swelling.   Diabetes: Morning blood sugar: 90-120's Evening: 180-200's. Occasionally has forgotten to take medications. Reports this happening twice a week, especially more often.  Is trying to maintain a diabetic diet.  But reports that she does eat some sweets on occasion.  Maybe more than she should.  Hyperlipidemia: Currently is eating all food groups. Denies eating a lot of red meat. Reports eating chicken and fish mostly. Has been skipping breakfast. Reports eating some fried foods, got an air fryer to help with this.   Itchy rash: Reports that she has been on antibiotics secondary to having UTIs is being followed by urology right now.  Reports that she is completed her antibiotics but she is now started to have this intermittent rash in the "hot spots" of her skin folds such as under her arms and under her stomach and in her groin.  She reports that it comes and goes.  But she is not having this experience right now.  But she did yesterday.  Reports that predominantly is just itchy and she would like to see if we can give her a cream for this.  Overall she feels good, denies having any fever, chills, cough, shortness of breath, sinus congestion, sinus pain, ear pain any trouble with bowels or bladder, any skin breakdown outside of an occasional itchy rash that she is previously reported above, changes in sleep or memory.  The patient does not have symptoms concerning for COVID-19 infection (fever, chills, cough, or new shortness of breath).   Past Medical, Surgical, Social History, Allergies, and Medications have been Reviewed.   Past Medical History:  Diagnosis Date  . Essential hypertension   .  Headache(784.0)   . History of kidney stones   . Hyperlipidemia   . Irregular heart beats   . Type 2 diabetes mellitus ([place])   . Vaginitis    Past Surgical History:  Procedure Laterality Date  . cyst removed Right 1998   cyst- removed from right wrist  . EXTRACORPOREAL SHOCK WAVE LITHOTRIPSY Right  05/29/2018   Procedure: EXTRACORPOREAL SHOCK WAVE LITHOTRIPSY (ESWL);  Surgeon: Kathie Rhodes, MD;  Location: [facility];  Service: Urology;  Laterality: Right;  . lithotrpsy     over 20 years  . NEPHROLITHOTOMY Right 05/31/2016   Procedure: NEPHROLITHOTOMY PERCUTANEOUS WITH SURGEON ACCESS;  Surgeon: Cleon Gustin, MD;  Location: [facility];  Service: Urology;  Laterality: Right;  . TOTAL ABDOMINAL HYSTERECTOMY W/ BILATERAL SALPINGOOPHORECTOMY  1998  . TUBAL LIGATION       Current Meds  Medication Sig  . amLODipine (NORVASC) 5 MG tablet Take 1 tablet (5 mg total) by mouth daily.  Marland Kitchen aspirin EC 81 MG tablet Take 1 tablet (81 mg total) by mouth daily.  Marland Kitchen atorvastatin (LIPITOR) 10 MG tablet TAKE HALF A TABLET BY MOUTH DAILY  . cholecalciferol (VITAMIN D) 1000 units tablet Take 1,000 Units by mouth daily.  Marland Kitchen glipiZIDE (GLUCOTROL) 10 MG tablet Take 1 tablet (10 mg total) by mouth 2 (two) times daily before a meal.  . glucose blood (ONE TOUCH ULTRA TEST) test strip USE TO TEST BLOOD SUGAR 3 TIMES A DAY AS DIRECTED DX E11.65  . HYDROcodone-acetaminophen (NORCO) 10-325 MG tablet Take 1-2 tablets by mouth every 4 (four) hours as needed for moderate pain. Maximum dose per 24 hours - 8 pills.  . Insulin Pen Needle (B-D ULTRAFINE III SHORT PEN) 31G X 8 MM MISC FOR USE WITH INSULIN once daily dx E11.9  . LANTUS SOLOSTAR 100 UNIT/ML Solostar Pen INJECT 22 UNITS INTO THE SKIN DAILY AT 10 PM.  . metFORMIN (GLUCOPHAGE) 1000 MG tablet Take 1 tablet (1,000 mg total) by mouth 2 (two) times daily with a meal.  . tamsulosin (FLOMAX) 0.4 MG CAPS capsule Take 1 capsule (0.4 mg total) by mouth daily after supper.     Allergies:   Onglyza [saxagliptin hydrochloride]; Pravastatin; and Ace inhibitors   Social History   Tobacco Use  . Smoking status: Never Smoker  . Smokeless tobacco: Never Used  Substance Use Topics  . Alcohol use: No  . Drug use: No     Family Hx: The patient's family history includes Alcohol  abuse in her father and mother; Diabetes in her sister and sister; Esophageal cancer in her father; Heart attack in her sister; Hypertension in her sister; Seizures in her brother.  ROS:   Please see the history of present illness.     Review of Systems  Constitutional: Negative for activity change, appetite change, chills and fever.  HENT: Negative for congestion, postnasal drip, rhinorrhea, sinus pressure, sinus pain, sneezing and sore throat.        Mild allergies  Eyes:       Needs eye checked again  Respiratory: Negative for cough, chest tightness, shortness of breath and wheezing.   Cardiovascular: Negative for chest pain and leg swelling.  Gastrointestinal: Negative.   Endocrine: Negative for polydipsia, polyphagia and polyuria.  Genitourinary: Negative.   Musculoskeletal: Negative.   Skin: Positive for rash.  Allergic/Immunologic: Positive for environmental allergies.  Neurological: Negative for dizziness and headaches.  Hematological: Negative.   Psychiatric/Behavioral: Negative.   All other systems reviewed and are negative.  Labs/Other Tests and Data Reviewed:    Recent Labs: 11/11/2017: ALT 11; Hemoglobin 12.3; Platelets 356 02/20/2018: BUN 12; Creat 0.64; Potassium 4.2; Sodium 141; TSH 0.72   Recent Lipid Panel Lab Results  Component Value Date/Time   CHOL 189 11/11/2017 08:48 AM   TRIG 71 11/11/2017 08:48 AM   HDL 71 11/11/2017 08:48 AM   CHOLHDL 2.7 11/11/2017 08:48 AM   LDLCALC 102 (H) 11/11/2017 08:48 AM    Wt Readings from Last 3 Encounters:  07/13/18 145 lb (65.8 kg)  05/29/18 146 lb (66.2 kg)  02/22/18 147 lb (66.7 kg)     Objective:    Vital Signs:  BP 128/78   Ht 5\' 4"  (1.626 m)   Wt 145 lb (65.8 kg)   BMI 24.89 kg/m    VITAL SIGNS:  reviewed GEN:  alert and oriented RESPIRATORY:  no shortness of breath noted in conversation PSYCH:  normal affect, mood, memory intact, good communication during conversation  ASSESSMENT & PLAN:    1.  Essential hypertension Previously controlled at other visits, continue current medication at this time.  She forgot to get labs for this appointment advised to get labs in the next week we will be checking kidney function at that time.  2. Type 2 diabetes mellitus with other specified complication, with long-term current use of insulin ([place]) Reports occasionally missing medications.  Advised to start a phone alarm to help her remember when to take her medications.  Encouraged her to maintain a diabetic heart healthy low-cholesterol diet.  Will provide these with information on the AVS to be mailed out.  Did not get her labs that were placed for this visit advised to get the labs in the next week will adjust and change medications if A1c is elevated.  Continue current medication regime for now.  3. Hyperlipidemia LDL goal <100 Previously had elevation in LDL.  Reports taking her medication without trouble.  Pending labs will adjust medication if needed.  Advised to continue a heart healthy, diabetic, low-fat diet.  She reports using an air Rolly Salter to help reduce the amount of fried food she eats.  She is encouraged to continue doing this  4. Rash Reports that she was on antibiotic and now has an intermittent rash underneath the hotspots of her skin folds.  We will provide her with a nystatin ointment to help see if that will help alleviate some of her discomfort with itchiness.  She is advised to contact us if it does not work.  - nystatin ointment (MYCOSTATIN); Apply 1 application topically 2 (two) times daily.  Dispense: 30 g; Refill: 0   Time:   Today, I have spent 10 minutes with the patient with telehealth technology discussing the above problems.     Medication Adjustments/Labs and Tests Ordered: Current medicines are reviewed at length with the patient today.  Concerns regarding medicines are outlined above.   Tests Ordered: No orders of the defined types were placed in this encounter.    Medication Changes: No orders of the defined types were placed in this encounter.   Disposition:  Follow up in 4 month(s)  Signed, Perlie Mayo, NP  07/13/2018 2:50 PM     [facility]

## 2018-08-03 ENCOUNTER — Ambulatory Visit: Payer: Medicare Other | Admitting: Family Medicine

## 2018-09-02 ENCOUNTER — Other Ambulatory Visit: Payer: Self-pay | Admitting: Family Medicine

## 2018-09-06 ENCOUNTER — Other Ambulatory Visit (HOSPITAL_COMMUNITY): Payer: Self-pay | Admitting: Family Medicine

## 2018-09-06 DIAGNOSIS — Z1231 Encounter for screening mammogram for malignant neoplasm of breast: Secondary | ICD-10-CM

## 2018-09-11 ENCOUNTER — Encounter (INDEPENDENT_AMBULATORY_CARE_PROVIDER_SITE_OTHER): Payer: Self-pay | Admitting: *Deleted

## 2018-09-28 ENCOUNTER — Telehealth (INDEPENDENT_AMBULATORY_CARE_PROVIDER_SITE_OTHER): Payer: Self-pay | Admitting: *Deleted

## 2018-09-28 NOTE — Telephone Encounter (Signed)
Referring MD/PCP: simpson   Procedure: tcs  Reason/Indication:  screening  Has patient had this procedure before?  Yes, 2009  If so, when, by whom and where?    Is there a family history of colon cancer?  no  Who?  What age when diagnosed?    Is patient diabetic?   yes      Does patient have prosthetic heart valve or mechanical valve?  no  Do you have a pacemaker?  no  Has patient ever had endocarditis? no  Has patient had joint replacement within last 12 months?  no  Is patient constipated or do they take laxatives? no  Does patient have a history of alcohol/drug use?  no  Is patient on blood thinner such as Coumadin, Plavix and/or Aspirin? yes  Medications: asa 81 mg daily, metformin 1000 mg bid, glipizide 10 mg bid, amlodipine 5 mg daily, atorvastatin 10 mg daily  Allergies: onglaza  Medication Adjustment per Dr Lindi Adie, NP: asa 2 days, hold glipizide evening & Morning  Procedure date & time: 10/26/18 at 1030

## 2018-10-02 NOTE — Telephone Encounter (Signed)
agree

## 2018-10-16 ENCOUNTER — Telehealth (INDEPENDENT_AMBULATORY_CARE_PROVIDER_SITE_OTHER): Payer: Self-pay | Admitting: *Deleted

## 2018-10-16 MED ORDER — PEG 3350-KCL-NA BICARB-NACL 420 G PO SOLR: 4000.0000 mL | Freq: Once | ORAL | 0 refills | Status: AC

## 2018-10-16 NOTE — Telephone Encounter (Signed)
Patient needs trilyte Pharmacy didn't get previous RX we sent before

## 2018-10-24 ENCOUNTER — Other Ambulatory Visit (HOSPITAL_COMMUNITY)
Admission: RE | Admit: 2018-10-24 | Discharge: 2018-10-24 | Disposition: A | Discharge status: Home / Self Care | Payer: Medicare Other | Source: Ambulatory Visit | Attending: Internal Medicine | Admitting: Internal Medicine

## 2018-10-24 ENCOUNTER — Other Ambulatory Visit: Payer: Self-pay

## 2018-10-24 DIAGNOSIS — Z01812 Encounter for preprocedural laboratory examination: Secondary | ICD-10-CM | POA: Diagnosis not present

## 2018-10-24 DIAGNOSIS — Z20828 Contact with and (suspected) exposure to other viral communicable diseases: Secondary | ICD-10-CM | POA: Diagnosis not present

## 2018-10-24 LAB — SARS CORONAVIRUS 2 (TAT 6-24 HRS): SARS Coronavirus 2: NEGATIVE

## 2018-10-26 ENCOUNTER — Encounter (HOSPITAL_COMMUNITY)
Admission: RE | Disposition: A | Discharge status: Home / Self Care | Payer: Self-pay | Source: Home / Self Care | Attending: Internal Medicine

## 2018-10-26 ENCOUNTER — Encounter (HOSPITAL_COMMUNITY): Payer: Self-pay | Admitting: *Deleted

## 2018-10-26 ENCOUNTER — Other Ambulatory Visit: Payer: Self-pay

## 2018-10-26 ENCOUNTER — Ambulatory Visit (HOSPITAL_COMMUNITY)
Admission: RE | Admit: 2018-10-26 | Discharge: 2018-10-26 | Disposition: A | Discharge status: Home / Self Care | Payer: Medicare Other | Attending: Internal Medicine | Admitting: Internal Medicine

## 2018-10-26 DIAGNOSIS — Z800 Family history of malignant neoplasm of digestive organs: Secondary | ICD-10-CM | POA: Diagnosis not present

## 2018-10-26 DIAGNOSIS — E119 Type 2 diabetes mellitus without complications: Secondary | ICD-10-CM | POA: Diagnosis not present

## 2018-10-26 DIAGNOSIS — K644 Residual hemorrhoidal skin tags: Secondary | ICD-10-CM | POA: Insufficient documentation

## 2018-10-26 DIAGNOSIS — D120 Benign neoplasm of cecum: Secondary | ICD-10-CM | POA: Insufficient documentation

## 2018-10-26 DIAGNOSIS — Z7982 Long term (current) use of aspirin: Secondary | ICD-10-CM | POA: Insufficient documentation

## 2018-10-26 DIAGNOSIS — Z79899 Other long term (current) drug therapy: Secondary | ICD-10-CM | POA: Insufficient documentation

## 2018-10-26 DIAGNOSIS — Z888 Allergy status to other drugs, medicaments and biological substances status: Secondary | ICD-10-CM | POA: Diagnosis not present

## 2018-10-26 DIAGNOSIS — Z833 Family history of diabetes mellitus: Secondary | ICD-10-CM | POA: Diagnosis not present

## 2018-10-26 DIAGNOSIS — D122 Benign neoplasm of ascending colon: Secondary | ICD-10-CM | POA: Insufficient documentation

## 2018-10-26 DIAGNOSIS — Z811 Family history of alcohol abuse and dependence: Secondary | ICD-10-CM | POA: Insufficient documentation

## 2018-10-26 DIAGNOSIS — I499 Cardiac arrhythmia, unspecified: Secondary | ICD-10-CM | POA: Diagnosis not present

## 2018-10-26 DIAGNOSIS — R51 Headache: Secondary | ICD-10-CM | POA: Insufficient documentation

## 2018-10-26 DIAGNOSIS — Z794 Long term (current) use of insulin: Secondary | ICD-10-CM | POA: Insufficient documentation

## 2018-10-26 DIAGNOSIS — Z1211 Encounter for screening for malignant neoplasm of colon: Secondary | ICD-10-CM | POA: Insufficient documentation

## 2018-10-26 DIAGNOSIS — Z87442 Personal history of urinary calculi: Secondary | ICD-10-CM | POA: Diagnosis not present

## 2018-10-26 DIAGNOSIS — Z90710 Acquired absence of both cervix and uterus: Secondary | ICD-10-CM | POA: Diagnosis not present

## 2018-10-26 DIAGNOSIS — Z8249 Family history of ischemic heart disease and other diseases of the circulatory system: Secondary | ICD-10-CM | POA: Diagnosis not present

## 2018-10-26 DIAGNOSIS — K5730 Diverticulosis of large intestine without perforation or abscess without bleeding: Secondary | ICD-10-CM | POA: Insufficient documentation

## 2018-10-26 DIAGNOSIS — E785 Hyperlipidemia, unspecified: Secondary | ICD-10-CM | POA: Diagnosis not present

## 2018-10-26 DIAGNOSIS — Z820 Family history of epilepsy and other diseases of the nervous system: Secondary | ICD-10-CM | POA: Insufficient documentation

## 2018-10-26 DIAGNOSIS — D123 Benign neoplasm of transverse colon: Secondary | ICD-10-CM | POA: Diagnosis not present

## 2018-10-26 DIAGNOSIS — I10 Essential (primary) hypertension: Secondary | ICD-10-CM | POA: Diagnosis not present

## 2018-10-26 HISTORY — PX: POLYPECTOMY: SHX5525

## 2018-10-26 HISTORY — PX: COLONOSCOPY: SHX5424

## 2018-10-26 LAB — GLUCOSE, CAPILLARY: Glucose-Capillary: 161 mg/dL — ABNORMAL HIGH (ref 70–99)

## 2018-10-26 SURGERY — COLONOSCOPY
Anesthesia: Moderate Sedation

## 2018-10-26 MED ORDER — MEPERIDINE HCL 50 MG/ML IJ SOLN
INTRAMUSCULAR | Status: DC | PRN
  Administered 2018-10-26 (×2): 25 mg via INTRAVENOUS

## 2018-10-26 MED ORDER — SODIUM CHLORIDE 0.9 % IV SOLN
INTRAVENOUS | Status: DC
  Administered 2018-10-26: 10:00:00 via INTRAVENOUS

## 2018-10-26 MED ORDER — MIDAZOLAM HCL 5 MG/5ML IJ SOLN
INTRAMUSCULAR | Status: DC | PRN
  Administered 2018-10-26: 1 mg via INTRAVENOUS
  Administered 2018-10-26: 2 mg via INTRAVENOUS
  Administered 2018-10-26: 1 mg via INTRAVENOUS
  Administered 2018-10-26: 2 mg via INTRAVENOUS

## 2018-10-26 MED ORDER — MEPERIDINE HCL 50 MG/ML IJ SOLN
INTRAMUSCULAR | Status: AC
  Filled 2018-10-26: qty 1

## 2018-10-26 MED ORDER — MIDAZOLAM HCL 5 MG/5ML IJ SOLN
INTRAMUSCULAR | Status: AC
  Filled 2018-10-26: qty 10

## 2018-10-26 MED ORDER — STERILE WATER FOR IRRIGATION IR SOLN
Status: DC | PRN
  Administered 2018-10-26: 2.5 mL

## 2018-10-26 NOTE — Op Note (Signed)
[facility] Patient Name: [name] Procedure Date: 10/26/2018 10:11 AM MRN: [id number] Date of Birth: 1950-09-06 Attending MD: Hildred Laser , MD CSN: [id number] Age: 68 Admit Type: Outpatient Procedure:                Colonoscopy Indications:              Screening for colorectal malignant neoplasm Providers:                Hildred Laser, MD, Otis Peak B. Sharon Seller, RN, Raphael Gibney, Technician Referring MD:             Norwood Levo. Moshe Cipro, MD Medicines:                Meperidine 50 mg IV, Midazolam 6 mg IV Complications:            No immediate complications. Estimated Blood Loss:     Estimated blood loss was minimal. Procedure:                Pre-Anesthesia Assessment:                           - Prior to the procedure, a History and Physical                            was performed, and patient medications and                            allergies were reviewed. The patient's tolerance of                            previous anesthesia was also reviewed. The risks                            and benefits of the procedure and the sedation                            options and risks were discussed with the patient.                            All questions were answered, and informed consent                            was obtained. Prior Anticoagulants: The patient has                            taken no previous anticoagulant or antiplatelet                            agents except for aspirin. ASA Grade Assessment: II                            - A patient with mild systemic disease. After  reviewing the risks and benefits, the patient was                            deemed in satisfactory condition to undergo the                            procedure.                           After obtaining informed consent, the colonoscope                            was passed under direct vision. Throughout the      procedure, the patient's blood pressure, pulse, and                            oxygen saturations were monitored continuously. The                            [id number] scope was introduced through                            the anus and advanced to the the cecum, identified                            by appendiceal orifice and ileocecal valve. The                            colonoscopy was technically difficult and complex                            due to restricted mobility of the colon. The                            patient tolerated the procedure well. The quality                            of the bowel preparation was good. The ileocecal                            valve, appendiceal orifice, and rectum were                            photographed. Scope In: 10:30:47 AM Scope Out: [id number] AM Scope Withdrawal Time: 0 hours 28 minutes 22 seconds  Total Procedure Duration: 0 hours 45 minutes 25 seconds  Findings:      The perianal and digital rectal examinations were normal.      Three sessile polyps were found in the hepatic flexure and cecum. The       polyps were small in size. These were biopsied with a cold forceps for       histology. The pathology specimen was placed into Bottle Number 1.      A 5 mm polyp was found in the ascending colon. The polyp was removed  with a cold snare. Resection and retrieval were complete. The pathology       specimen was placed into Bottle Number 1.      Two pedunculated and semi-pedunculated polyps were found in the hepatic       flexure. The polyps were medium in size. These polyps were removed with       a hot snare. Resection and retrieval were complete. The pathology       specimen was placed into Bottle Number 2.      A few small-mouthed diverticula were found in the hepatic flexure.      Multiple medium-mouthed diverticula were found in the sigmoid colon.      External hemorrhoids were found during retroflexion. The  hemorrhoids       were medium-sized. Impression:               - Three small polyps at the hepatic flexure and in                            the cecum. Biopsied.                           - One 5 mm polyp in the ascending colon, removed                            with a cold snare. Resected and retrieved.                           - Two medium polyps at the hepatic flexure, removed                            with a hot snare. Resected and retrieved.                           - Diverticulosis at the hepatic flexure.                           - Diverticulosis in the sigmoid colon.                           - External hemorrhoids. Moderate Sedation:      Moderate (conscious) sedation was administered by the endoscopy nurse       and supervised by the endoscopist. The following parameters were       monitored: oxygen saturation, heart rate, blood pressure, CO2       capnography and response to care. Total physician intraservice time was       50 minutes. Recommendation:           - Patient has a contact number available for                            emergencies. The signs and symptoms of potential                            delayed complications were discussed with the  patient. Return to normal activities tomorrow.                            Written discharge instructions were provided to the                            patient.                           - High fiber diet and diabetic (ADA) diet today.                           - Continue present medications.                           - No aspirin, ibuprofen, naproxen, or other                            non-steroidal anti-inflammatory drugs for 7 days                            after polyp removal.                           - Await pathology results.                           - Repeat colonoscopy is recommended. The                            colonoscopy date will be determined after pathology                             results from today's exam become available for                            review. Procedure Code(s):        --- Professional ---                           [phone], Colonoscopy, flexible; with removal of                            tumor(s), polyp(s), or other lesion(s) by snare                            technique                           45380, 59, Colonoscopy, flexible; with biopsy,                            single or multiple                           99153, Moderate sedation; each additional 15  minutes intraservice time                           99153, Moderate sedation; each additional 15                            minutes intraservice time                           G0500, Moderate sedation services provided by the                            same physician or other qualified health care                            professional performing a gastrointestinal                            endoscopic service that sedation supports,                            requiring the presence of an independent trained                            observer to assist in the monitoring of the                            patient's level of consciousness and physiological                            status; initial 15 minutes of intra-service time;                            patient age 48 years or older (additional time may                            be reported with [phone], as appropriate) Diagnosis Code(s):        --- Professional ---                           K63.5, Polyp of colon                           Z12.11, Encounter for screening for malignant                            neoplasm of colon                           K64.4, Residual hemorrhoidal skin tags                           K57.30, Diverticulosis of large intestine without                            perforation or abscess without bleeding CPT  copyright 2019 [employer] Medical [employer]. All rights reserved. The  codes documented in this report are preliminary and upon coder review may  be revised to meet current compliance requirements. Hildred Laser, MD Hildred Laser, MD 10/26/2018 11:28:56 AM This report has been signed electronically. Number of Addenda: 0

## 2018-10-26 NOTE — Discharge Instructions (Signed)
No aspirin or NSAIDs for 1 week. Resume other medications as before. Modified carb high-fiber diet No driving for 24 hours. Physician will call with biopsy results.   Colonoscopy, Adult, Care After This sheet gives you information about how to care for yourself after your procedure. Your doctor may also give you more specific instructions. If you have problems or questions, call your doctor. What can I expect after the procedure? After the procedure, it is common to have:  A small amount of blood in your poop for 24 hours.  Some gas.  Mild cramping or bloating in your belly. Follow these instructions at home: General instructions  For the first 24 hours after the procedure: ? Do not drive or use machinery. ? Do not sign important documents. ? Do not drink alcohol. ? Do your daily activities more slowly than normal. ? Eat foods that are soft and easy to digest.  Take over-the-counter or prescription medicines only as told by your doctor. To help cramping and bloating:   Try walking around.  Put heat on your belly (abdomen) as told by your doctor. Use a heat source that your doctor recommends, such as a moist heat pack or a heating pad. ? Put a towel between your skin and the heat source. ? Leave the heat on for 20-30 minutes. ? Remove the heat if your skin turns bright red. This is especially important if you cannot feel pain, heat, or cold. You can get burned. Eating and drinking   Drink enough fluid to keep your pee (urine) clear or pale yellow.  Return to your normal diet as told by your doctor. Avoid heavy or fried foods that are hard to digest.  Avoid drinking alcohol for as long as told by your doctor. Contact a doctor if:  You have blood in your poop (stool) 2-3 days after the procedure. Get help right away if:  You have more than a small amount of blood in your poop.  You see large clumps of tissue (blood clots) in your poop.  Your belly is swollen.  You  feel sick to your stomach (nauseous).  You throw up (vomit).  You have a fever.  You have belly pain that gets worse, and medicine does not help your pain. Summary  After the procedure, it is common to have a small amount of blood in your poop. You may also have mild cramping and bloating in your belly.  For the first 24 hours after the procedure, do not drive or use machinery, do not sign important documents, and do not drink alcohol.  Get help right away if you have a lot of blood in your poop, feel sick to your stomach, have a fever, or have more belly pain. This information is not intended to replace advice given to you by your health care provider. Make sure you discuss any questions you have with your health care provider. Document Released: 03/27/2010 Document Revised: 12/23/2016 Document Reviewed: 11/17/2015 Elsevier Patient Education  2020 [employer].   Colon Polyps  Polyps are tissue growths inside the body. Polyps can grow in many places, including the large intestine (colon). A polyp may be a round bump or a mushroom-shaped growth. You could have one polyp or several. Most colon polyps are noncancerous (benign). However, some colon polyps can become cancerous over time. Finding and removing the polyps early can help prevent this. What are the causes? The exact cause of colon polyps is not known. What increases the risk? You  are more likely to develop this condition if you:  Have a family history of colon cancer or colon polyps.  Are older than 2 or older than 45 if you are African American.  Have inflammatory bowel disease, such as ulcerative colitis or Crohn's disease.  Have certain hereditary conditions, such as: ? Familial adenomatous polyposis. ? Lynch syndrome. ? Turcot syndrome. ? Peutz-Jeghers syndrome.  Are overweight.  Smoke cigarettes.  Do not get enough exercise.  Drink too much alcohol.  Eat a diet that is high in fat and red meat and low in  fiber.  Had childhood cancer that was treated with abdominal radiation. What are the signs or symptoms? Most polyps do not cause symptoms. If you have symptoms, they may include:  Blood coming from your rectum when having a bowel movement.  Blood in your stool. The stool may look dark red or black.  Abdominal pain.  A change in bowel habits, such as constipation or diarrhea. How is this diagnosed? This condition is diagnosed with a colonoscopy. This is a procedure in which a lighted, flexible scope is inserted into the anus and then passed into the colon to examine the area. Polyps are sometimes found when a colonoscopy is done as part of routine cancer screening tests. How is this treated? Treatment for this condition involves removing any polyps that are found. Most polyps can be removed during a colonoscopy. Those polyps will then be tested for cancer. Additional treatment may be needed depending on the results of testing. Follow these instructions at home: Lifestyle  Maintain a healthy weight, or lose weight if recommended by your health care provider.  Exercise every day or as told by your health care provider.  Do not use any products that contain nicotine or tobacco, such as cigarettes and e-cigarettes. If you need help quitting, ask your health care provider.  If you drink alcohol, limit how much you have: ? 0-1 drink a day for women. ? 0-2 drinks a day for men.  Be aware of how much alcohol is in your drink. In the [place] one drink equals one 12 oz bottle of beer (355 mL), one 5 oz glass of wine (148 mL), or one 1 oz shot of hard liquor (44 mL). Eating and drinking   Eat foods that are high in fiber, such as fruits, vegetables, and whole grains.  Eat foods that are high in calcium and vitamin D, such as milk, cheese, yogurt, eggs, liver, fish, and broccoli.  Limit foods that are high in fat, such as fried foods and desserts.  Limit the amount of red meat and  processed meat you eat, such as hot dogs, sausage, bacon, and lunch meats. General instructions  Keep all follow-up visits as told by your health care provider. This is important. ? This includes having regularly scheduled colonoscopies. ? Talk to your health care provider about when you need a colonoscopy. Contact a health care provider if:  You have new or worsening bleeding during a bowel movement.  You have new or increased blood in your stool.  You have a change in bowel habits.  You lose weight for no known reason. Summary  Polyps are tissue growths inside the body. Polyps can grow in many places, including the colon.  Most colon polyps are noncancerous (benign), but some can become cancerous over time.  This condition is diagnosed with a colonoscopy.  Treatment for this condition involves removing any polyps that are found. Most polyps can be removed  during a colonoscopy. This information is not intended to replace advice given to you by your health care provider. Make sure you discuss any questions you have with your health care provider. Document Released: 11/19/2003 Document Revised: 06/09/2017 Document Reviewed: 06/09/2017 Elsevier Patient Education  2020 [employer].   Diverticulosis  Diverticulosis is a condition that develops when small pouches (diverticula) form in the wall of the large intestine (colon). The colon is where water is absorbed and stool is formed. The pouches form when the inside layer of the colon pushes through weak spots in the outer layers of the colon. You may have a few pouches or many of them. What are the causes? The cause of this condition is not known. What increases the risk? The following factors may make you more likely to develop this condition:  Being older than age 57. Your risk for this condition increases with age. Diverticulosis is rare among people younger than age 29. By age 46, many people have it.  Eating a low-fiber  diet.  Having frequent constipation.  Being overweight.  Not getting enough exercise.  Smoking.  Taking over-the-counter pain medicines, like aspirin and ibuprofen.  Having a family history of diverticulosis. What are the signs or symptoms? In most people, there are no symptoms of this condition. If you do have symptoms, they may include:  Bloating.  Cramps in the abdomen.  Constipation or diarrhea.  Pain in the lower left side of the abdomen. How is this diagnosed? This condition is most often diagnosed during an exam for other colon problems. Because diverticulosis usually has no symptoms, it often cannot be diagnosed independently. This condition may be diagnosed by:  Using a flexible scope to examine the colon (colonoscopy).  Taking an X-ray of the colon after dye has been put into the colon (barium enema).  Doing a CT scan. How is this treated? You may not need treatment for this condition if you have never developed an infection related to diverticulosis. If you have had an infection before, treatment may include:  Eating a high-fiber diet. This may include eating more fruits, vegetables, and grains.  Taking a fiber supplement.  Taking a live bacteria supplement (probiotic).  Taking medicine to relax your colon.  Taking antibiotic medicines. Follow these instructions at home:  Drink 6-8 glasses of water or more each day to prevent constipation.  Try not to strain when you have a bowel movement.  If you have had an infection before: ? Eat more fiber as directed by your health care provider or your diet and nutrition specialist (dietitian). ? Take a fiber supplement or probiotic, if your health care provider approves.  Take over-the-counter and prescription medicines only as told by your health care provider.  If you were prescribed an antibiotic, take it as told by your health care provider. Do not stop taking the antibiotic even if you start to feel  better.  Keep all follow-up visits as told by your health care provider. This is important. Contact a health care provider if:  You have pain in your abdomen.  You have bloating.  You have cramps.  You have not had a bowel movement in 3 days. Get help right away if:  Your pain gets worse.  Your bloating becomes very bad.  You have a fever or chills, and your symptoms suddenly get worse.  You vomit.  You have bowel movements that are bloody or black.  You have bleeding from your rectum. Summary  Diverticulosis is  a condition that develops when small pouches (diverticula) form in the wall of the large intestine (colon).  You may have a few pouches or many of them.  This condition is most often diagnosed during an exam for other colon problems.  If you have had an infection related to diverticulosis, treatment may include increasing the fiber in your diet, taking supplements, or taking medicines. This information is not intended to replace advice given to you by your health care provider. Make sure you discuss any questions you have with your health care provider. Document Released: 11/20/2003 Document Revised: 02/04/2017 Document Reviewed: 01/12/2016 Elsevier Patient Education  2020 [employer].

## 2018-10-26 NOTE — H&P (Signed)
[name] is an 68 y.o. female.   Chief Complaint: Patient is here for colonoscopy. HPI: Patient is 69 year old African-American female who is here for screening colonoscopy.  Last exam was in 2009.  She denies abdominal pain change in bowel habits or rectal bleeding. Family history is negative for CRC. Last aspirin dose was 2 days ago.  Past Medical History:  Diagnosis Date  . Essential hypertension   . Headache(784.0)   . History of kidney stones   . Hyperlipidemia   . Irregular heart beats   . Type 2 diabetes mellitus ([place])   . Vaginitis     Past Surgical History:  Procedure Laterality Date  . cyst removed Right 1998   cyst- removed from right wrist  . EXTRACORPOREAL SHOCK WAVE LITHOTRIPSY Right 05/29/2018   Procedure: EXTRACORPOREAL SHOCK WAVE LITHOTRIPSY (ESWL);  Surgeon: Kathie Rhodes, MD;  Location: [facility];  Service: Urology;  Laterality: Right;  . lithotrpsy     over 20 years  . NEPHROLITHOTOMY Right 05/31/2016   Procedure: NEPHROLITHOTOMY PERCUTANEOUS WITH SURGEON ACCESS;  Surgeon: Cleon Gustin, MD;  Location: [facility];  Service: Urology;  Laterality: Right;  . TOTAL ABDOMINAL HYSTERECTOMY W/ BILATERAL SALPINGOOPHORECTOMY  1998  . TUBAL LIGATION      Family History  Problem Relation Age of Onset  . Alcohol abuse Mother   . Esophageal cancer Father   . Alcohol abuse Father   . Diabetes Sister   . Diabetes Sister   . Hypertension Sister   . Heart attack Sister        in 38's  . Seizures Brother    Social History:  reports that she has never smoked. She has never used smokeless tobacco. She reports that she does not drink alcohol or use drugs.  Allergies:  Allergies  Allergen Reactions  . Onglyza [Saxagliptin Hydrochloride] Swelling    Tongue swell  . Pravastatin Other (See Comments)    Muscle aches  . Ace Inhibitors Cough    Medications Prior to Admission  Medication Sig Dispense Refill  . amLODipine (NORVASC) 5 MG tablet Take 1 tablet (5 mg  total) by mouth daily. 90 tablet 1  . aspirin EC 81 MG tablet Take 1 tablet (81 mg total) by mouth daily.    Marland Kitchen atorvastatin (LIPITOR) 10 MG tablet TAKE HALF A TABLET BY MOUTH DAILY (Patient taking differently: Take 5 mg by mouth every evening. ) 45 tablet 1  . CALCIUM PO Take 1,000 mg by mouth daily.    . Cholecalciferol (VITAMIN D3) 50 MCG (2000 UT) TABS Take 2,000 Units by mouth daily.    Marland Kitchen glipiZIDE (GLUCOTROL) 10 MG tablet Take 1 tablet (10 mg total) by mouth 2 (two) times daily before a meal. 180 tablet 3  . LANTUS SOLOSTAR 100 UNIT/ML Solostar Pen INJECT 22 UNITS INTO THE SKIN DAILY AT 10 PM. (Patient taking differently: Inject 22 Units into the skin at bedtime. ) 15 pen 3  . metFORMIN (GLUCOPHAGE) 1000 MG tablet Take 1 tablet (1,000 mg total) by mouth 2 (two) times daily with a meal. 180 tablet 3  . Multiple Vitamin (MULTIVITAMIN WITH MINERALS) TABS tablet Take 1 tablet by mouth daily.    Marland Kitchen nystatin ointment (MYCOSTATIN) Apply 1 application topically 2 (two) times daily. (Patient taking differently: Apply 1 application topically 2 (two) times daily as needed (skin irritation). ) 30 g 0  . Polyethyl Glycol-Propyl Glycol (LUBRICANT EYE DROPS) 0.4-0.3 % SOLN Place 1 drop into both eyes 3 (three) times daily as  needed (dry/irritated eyes.).    Marland Kitchen acetaminophen (TYLENOL) 500 MG tablet Take 1,000 mg by mouth every 6 (six) hours as needed (for headaches/pain.).    Marland Kitchen HYDROcodone-acetaminophen (NORCO) 10-325 MG tablet Take 1-2 tablets by mouth every 4 (four) hours as needed for moderate pain. Maximum dose per 24 hours - 8 pills. (Patient not taking: Reported on 10/19/2018) 20 tablet 0  . Insulin Pen Needle (B-D ULTRAFINE III SHORT PEN) 31G X 8 MM MISC FOR USE WITH INSULIN once daily dx E11.9 100 each 9  . ONETOUCH ULTRA test strip USE TO TEST BLOOD SUGAR 3 TIMES A DAY AS DIRECTED DX E11.65 100 strip 5  . tamsulosin (FLOMAX) 0.4 MG CAPS capsule Take 1 capsule (0.4 mg total) by mouth daily after supper.  (Patient not taking: Reported on 10/19/2018) 30 capsule 0    Results for orders placed or performed during the hospital encounter of 10/26/18 (from the past 48 hour(s))  Glucose, capillary     Status: Abnormal   Collection Time: 10/26/18 10:05 AM  Result Value Ref Range   Glucose-Capillary 161 (H) 70 - 99 mg/dL   No results found.  ROS  Blood pressure (!) 152/84, pulse (!) 108, temperature 98.5 F (36.9 C), temperature source Oral, resp. rate 14, height 5\' 4"  (1.626 m), weight 66.2 kg, SpO2 96 %. Physical Exam  Constitutional: She appears well-developed and well-nourished.  HENT:  Mouth/Throat: Oropharynx is clear and moist.  Eyes: Conjunctivae are normal. No scleral icterus.  Neck: No thyromegaly present.  Cardiovascular: Normal rate, regular rhythm and normal heart sounds.  No murmur heard. Respiratory: Effort normal and breath sounds normal.  GI:  Abdomen is symmetrical.  She has low midline scar.  She has a small subcutaneous from lesion in epigastric region just above the scar suspicious for sebaceous cyst.  No organomegaly or masses.  Musculoskeletal:        General: No edema.  Lymphadenopathy:    She has no cervical adenopathy.  Neurological: She is alert.  Skin: Skin is warm and dry.     Assessment/Plan Average risk screening colonoscopy.  Hildred Laser, MD 10/26/2018, 10:22 AM

## 2018-11-01 ENCOUNTER — Encounter (HOSPITAL_COMMUNITY): Payer: Self-pay | Admitting: Internal Medicine

## 2018-11-02 ENCOUNTER — Other Ambulatory Visit: Payer: Self-pay | Admitting: Family Medicine

## 2018-11-14 ENCOUNTER — Other Ambulatory Visit: Payer: Self-pay

## 2018-11-14 ENCOUNTER — Encounter: Payer: Self-pay | Admitting: Family Medicine

## 2018-11-14 ENCOUNTER — Ambulatory Visit (INDEPENDENT_AMBULATORY_CARE_PROVIDER_SITE_OTHER): Payer: Medicare Other | Admitting: Family Medicine

## 2018-11-14 VITALS — BP 128/78 | Ht 64.0 in | Wt 145.0 lb

## 2018-11-14 DIAGNOSIS — I10 Essential (primary) hypertension: Secondary | ICD-10-CM

## 2018-11-14 DIAGNOSIS — B369 Superficial mycosis, unspecified: Secondary | ICD-10-CM

## 2018-11-14 DIAGNOSIS — Z794 Long term (current) use of insulin: Secondary | ICD-10-CM

## 2018-11-14 DIAGNOSIS — Z1231 Encounter for screening mammogram for malignant neoplasm of breast: Secondary | ICD-10-CM

## 2018-11-14 DIAGNOSIS — E785 Hyperlipidemia, unspecified: Secondary | ICD-10-CM | POA: Diagnosis not present

## 2018-11-14 DIAGNOSIS — E1169 Type 2 diabetes mellitus with other specified complication: Secondary | ICD-10-CM

## 2018-11-14 MED ORDER — CLOTRIMAZOLE-BETAMETHASONE 1-0.05 % EX CREA: 1.0000 "application " | TOPICAL_CREAM | Freq: Two times a day (BID) | CUTANEOUS | 1 refills | Status: DC

## 2018-11-14 NOTE — Patient Instructions (Addendum)
F/U in office with MD,  next 1 to 2 weeks for lab review and medication management , also foot exam, pt to get flu vaccine at that visit.  Please also schedule mammogram and mail appt info to pt.  Please get fasting lipid, cmp and EGFR , CBC and hBA1C and microalbumin at lab next door one day this week,( Thursday or Friday)  I have prescribed  antifungal cream for use on the rash twice daily for 2 weeks, then as needed, will re evaluate at office visit  Please make appointment for your eye exam , this is past due  Bloating will be discussed further  at office visit

## 2018-11-15 DIAGNOSIS — B369 Superficial mycosis, unspecified: Secondary | ICD-10-CM | POA: Insufficient documentation

## 2018-11-15 NOTE — Assessment & Plan Note (Signed)
Updated lab needed at/ before next visit. Hyperlipidemia:Low fat diet discussed and encouraged.   Lipid Panel  Lab Results  Component Value Date   CHOL 189 11/11/2017   HDL 71 11/11/2017   LDLCALC 102 (H) 11/11/2017   TRIG 71 11/11/2017   CHOLHDL 2.7 11/11/2017

## 2018-11-15 NOTE — Assessment & Plan Note (Signed)
Topical clotrimazole/ betamethasone prescribed , will re eval at [facility] in next 2 weeks

## 2018-11-15 NOTE — Assessment & Plan Note (Signed)
Needs [facility] for re evaluation, has been controlled in the past DASH diet and commitment to daily physical activity for a minimum of 30 minutes discussed and encouraged, as a part of hypertension management. The importance of attaining a healthy weight is also discussed.  BP/Weight 11/14/2018 10/26/2018 07/13/2018 05/29/2018 02/22/2018 12/26/2017 0000000  Systolic BP 0000000 99991111 0000000 [id number] 0000000 [id number]  Diastolic BP 78 69 78 81 78 80 78  Wt. (Lbs) 145 146 145 146 147 146 144.12  BMI 24.89 25.06 24.89 24.3 24.46 24.3 23.98

## 2018-11-15 NOTE — Assessment & Plan Note (Signed)
Updated lab needed at/ before next visit. [name] is reminded of the importance of commitment to daily physical activity for 30 minutes or more, as able and the need to limit carbohydrate intake to 30 to 60 grams per meal to help with blood sugar control.   The need to take medication as prescribed, test blood sugar as directed, and to call between visits if there is a concern that blood sugar is uncontrolled is also discussed.   [name] is reminded of the importance of daily foot exam, annual eye examination, and good blood sugar, blood pressure and cholesterol control.  Diabetic Labs Latest Ref Rng & Units 02/20/2018 11/11/2017 07/13/2017 07/12/2017 12/23/2016  HbA1c <5.7 % of total Hgb 7.7(H) 7.6(H) - 7.1(H) 7.4(H)  Microalbumin Not Estab. ug/mL - - 17.9(H) - -  Micro/Creat Ratio 0.0 - 30.0 mg/g creat - - 22.9 - -  Chol <200 mg/dL - 189 - 197 -  HDL >50 mg/dL - 71 - 72 -  Calc LDL mg/dL (calc) - 102(H) - 109(H) -  Triglycerides <150 mg/dL - 71 - 69 -  Creatinine 0.50 - 0.99 mg/dL 0.64 0.65 - 0.74 0.58   BP/Weight 11/14/2018 10/26/2018 07/13/2018 05/29/2018 02/22/2018 12/26/2017 0000000  Systolic BP 0000000 99991111 0000000 Q000111Q 0000000 [id number]  Diastolic BP 78 69 78 81 78 80 78  Wt. (Lbs) 145 146 145 146 147 146 144.12  BMI 24.89 25.06 24.89 24.3 24.46 24.3 23.98   Foot/eye exam completion dates Latest Ref Rng & Units 02/22/2018 07/13/2017  Eye Exam No Retinopathy - -  Foot exam Order - - -  Foot Form Completion - Done Done

## 2018-11-15 NOTE — Progress Notes (Signed)
Virtual Visit via Telephone Note  I connected with [name] on 11/15/18 at  3:20 PM EDT by telephone and verified that I am speaking with the correct person using two identifiers.  Location: Patient: home  Provider: office  I discussed the limitations, risks, security and privacy concerns of performing an evaluation and management service by telephone and the availability of in person appointments. I also discussed with the patient that there may be a patient responsible charge related to this service. The patient expressed understanding and agreed to proceed.   I connected with [name] on 11/15/18 at  3:20 PM EDT by telephone and verified that I am speaking with the correct person using two identifiers.  Location: Patient:home  Provider: office   I discussed the limitations, risks, security and privacy concerns of performing an evaluation and management service by telephone and the availability of in person appointments. I also discussed with the patient that there may be a patient responsible charge related to this service. The patient expressed understanding and agreed to proceed.   History of Present Illness: C/o rash in both armpits, groin and under breasts, worse in the past 3 weeks. Has been  using nystatin powder with little benefit. C/o thickened toenails and wants to start antifungal Denies polyuria, polydipsia, blurred vision , or hypoglycemic episodes. Labs are overdue and she will get them in the near future  Observations/Objective:  BP 128/78   Ht 5\' 4"  (1.626 m)   Wt 145 lb (65.8 kg)   BMI 24.89 kg/m   Patient report Good communication with no confusion and intact memory. Alert and oriented x 3 No signs of respiratory distress during speech  Assessment and Plan: Dermatomycosis Topical clotrimazole/ betamethasone prescribed , will re eval at oV in next 2 weeks  Type 2 diabetes mellitus with other specified complication ([place]) Updated lab needed  at/ before next visit. [name] is reminded of the importance of commitment to daily physical activity for 30 minutes or more, as able and the need to limit carbohydrate intake to 30 to 60 grams per meal to help with blood sugar control.   The need to take medication as prescribed, test blood sugar as directed, and to call between visits if there is a concern that blood sugar is uncontrolled is also discussed.   [name] is reminded of the importance of daily foot exam, annual eye examination, and good blood sugar, blood pressure and cholesterol control.  Diabetic Labs Latest Ref Rng & Units 02/20/2018 11/11/2017 07/13/2017 07/12/2017 12/23/2016  HbA1c <5.7 % of total Hgb 7.7(H) 7.6(H) - 7.1(H) 7.4(H)  Microalbumin Not Estab. ug/mL - - 17.9(H) - -  Micro/Creat Ratio 0.0 - 30.0 mg/g creat - - 22.9 - -  Chol <200 mg/dL - 189 - 197 -  HDL >50 mg/dL - 71 - 72 -  Calc LDL mg/dL (calc) - 102(H) - 109(H) -  Triglycerides <150 mg/dL - 71 - 69 -  Creatinine 0.50 - 0.99 mg/dL 0.64 0.65 - 0.74 0.58   BP/Weight 11/14/2018 10/26/2018 07/13/2018 05/29/2018 02/22/2018 12/26/2017 0000000  Systolic BP 0000000 99991111 0000000 Q000111Q 0000000 [id number]  Diastolic BP 78 69 78 81 78 80 78  Wt. (Lbs) 145 146 145 146 147 146 144.12  BMI 24.89 25.06 24.89 24.3 24.46 24.3 23.98   Foot/eye exam completion dates Latest Ref Rng & Units 02/22/2018 07/13/2017  Eye Exam No Retinopathy - -  Foot exam Order - - -  Foot Form Completion - Done Done  Essential hypertension  Needs oV for re evaluation, has been controlled in the past DASH diet and commitment to daily physical activity for a minimum of 30 minutes discussed and encouraged, as a part of hypertension management. The importance of attaining a healthy weight is also discussed.  BP/Weight 11/14/2018 10/26/2018 07/13/2018 05/29/2018 02/22/2018 12/26/2017 0000000  Systolic BP 0000000 99991111 0000000 Q000111Q 0000000 [id number]  Diastolic BP 78 69 78 81 78 80 78  Wt. (Lbs) 145 146 145 146 147 146 144.12   BMI 24.89 25.06 24.89 24.3 24.46 24.3 23.98       Hyperlipidemia LDL goal <100 Updated lab needed at/ before next visit. Hyperlipidemia:Low fat diet discussed and encouraged.   Lipid Panel  Lab Results  Component Value Date   CHOL 189 11/11/2017   HDL 71 11/11/2017   LDLCALC 102 (H) 11/11/2017   TRIG 71 11/11/2017   CHOLHDL 2.7 11/11/2017         Follow Up Instructions:    I discussed the assessment and treatment plan with the patient. The patient was provided an opportunity to ask questions and all were answered. The patient agreed with the plan and demonstrated an understanding of the instructions.   The patient was advised to call back or seek an in-person evaluation if the symptoms worsen or if the condition fails to improve as anticipated.  I provided 21 minutes of non-face-to-face time during this encounter.   [name], MD    History of Present Illness: See above note   Observations/Objective:   Assessment and Plan:   Follow Up Instructions:    I discussed the assessment and treatment plan with the patient. The patient was provided an opportunity to ask questions and all were answered. The patient agreed with the plan and demonstrated an understanding of the instructions.   The patient was advised to call back or seek an in-person evaluation if the symptoms worsen or if the condition fails to improve as anticipated.  I provided 35minutes of non-face-to-face time during this encounter.   [name], MD

## 2018-11-16 DIAGNOSIS — E785 Hyperlipidemia, unspecified: Secondary | ICD-10-CM | POA: Diagnosis not present

## 2018-11-16 DIAGNOSIS — I10 Essential (primary) hypertension: Secondary | ICD-10-CM | POA: Diagnosis not present

## 2018-11-16 DIAGNOSIS — E1169 Type 2 diabetes mellitus with other specified complication: Secondary | ICD-10-CM | POA: Diagnosis not present

## 2018-11-16 DIAGNOSIS — Z794 Long term (current) use of insulin: Secondary | ICD-10-CM | POA: Diagnosis not present

## 2018-11-17 LAB — HEMOGLOBIN A1C
Hgb A1c MFr Bld: 7.7 % of total Hgb — ABNORMAL HIGH (ref <5.7)
Mean Plasma Glucose: 174 (calc)
eAG (mmol/L): 9.7 (calc)

## 2018-11-17 LAB — MICROALBUMIN / CREATININE URINE RATIO
Creatinine, Urine: 62 mg/dL (ref 20–275)
Microalb Creat Ratio: 37 mcg/mg creat — ABNORMAL HIGH (ref <30)
Microalb, Ur: 2.3 mg/dL

## 2018-11-17 LAB — CBC
HCT: 38.1 % (ref 35.0–45.0)
Hemoglobin: 12.3 g/dL (ref 11.7–15.5)
MCH: 28.6 pg (ref 27.0–33.0)
MCHC: 32.3 g/dL (ref 32.0–36.0)
MCV: 88.6 fL (ref 80.0–100.0)
MPV: 10.6 fL (ref 7.5–12.5)
Platelets: 362 10*3/uL (ref 140–400)
RBC: 4.3 10*6/uL (ref 3.80–5.10)
RDW: 12.7 % (ref 11.0–15.0)
WBC: 4.7 10*3/uL (ref 3.8–10.8)

## 2018-11-17 LAB — LIPID PANEL
Cholesterol: 210 mg/dL — ABNORMAL HIGH (ref <200)
HDL: 88 mg/dL (ref >=50)
LDL Cholesterol (Calc): 106 mg/dL (calc) — ABNORMAL HIGH
Non-HDL Cholesterol (Calc): 122 mg/dL (calc) (ref <130)
Total CHOL/HDL Ratio: 2.4 (calc) (ref <5.0)
Triglycerides: 71 mg/dL (ref <150)

## 2018-11-17 LAB — COMPLETE METABOLIC PANEL WITH GFR
AG Ratio: 1.7 (calc) (ref 1.0–2.5)
ALT: 13 U/L (ref 6–29)
AST: 14 U/L (ref 10–35)
Albumin: 4.7 g/dL (ref 3.6–5.1)
Alkaline phosphatase (APISO): 97 U/L (ref 37–153)
BUN: 16 mg/dL (ref 7–25)
CO2: 29 mmol/L (ref 20–32)
Calcium: 9.7 mg/dL (ref 8.6–10.4)
Chloride: 101 mmol/L (ref 98–110)
Creat: 0.58 mg/dL (ref 0.50–0.99)
GFR, Est African American: 109 mL/min/{1.73_m2} (ref >=60)
GFR, Est Non African American: 94 mL/min/{1.73_m2} (ref >=60)
Globulin: 2.7 g/dL (calc) (ref 1.9–3.7)
Glucose, Bld: 80 mg/dL (ref 65–99)
Potassium: 3.7 mmol/L (ref 3.5–5.3)
Sodium: 140 mmol/L (ref 135–146)
Total Bilirubin: 0.7 mg/dL (ref 0.2–1.2)
Total Protein: 7.4 g/dL (ref 6.1–8.1)

## 2018-11-19 ENCOUNTER — Other Ambulatory Visit: Payer: Self-pay | Admitting: Family Medicine

## 2018-11-30 ENCOUNTER — Ambulatory Visit (HOSPITAL_COMMUNITY): Payer: Medicare Other

## 2018-12-06 ENCOUNTER — Encounter: Payer: Self-pay | Admitting: Family Medicine

## 2018-12-06 ENCOUNTER — Ambulatory Visit (INDEPENDENT_AMBULATORY_CARE_PROVIDER_SITE_OTHER): Payer: Medicare Other | Admitting: Family Medicine

## 2018-12-06 ENCOUNTER — Encounter (INDEPENDENT_AMBULATORY_CARE_PROVIDER_SITE_OTHER): Payer: Self-pay

## 2018-12-06 ENCOUNTER — Other Ambulatory Visit: Payer: Self-pay

## 2018-12-06 VITALS — BP 128/82 | HR 87 | Temp 98.0°F | Resp 15 | Ht 64.0 in | Wt 145.0 lb

## 2018-12-06 DIAGNOSIS — R42 Dizziness and giddiness: Secondary | ICD-10-CM

## 2018-12-06 DIAGNOSIS — Z794 Long term (current) use of insulin: Secondary | ICD-10-CM | POA: Diagnosis not present

## 2018-12-06 DIAGNOSIS — E1169 Type 2 diabetes mellitus with other specified complication: Secondary | ICD-10-CM | POA: Diagnosis not present

## 2018-12-06 DIAGNOSIS — I10 Essential (primary) hypertension: Secondary | ICD-10-CM | POA: Diagnosis not present

## 2018-12-06 DIAGNOSIS — E785 Hyperlipidemia, unspecified: Secondary | ICD-10-CM | POA: Diagnosis not present

## 2018-12-06 MED ORDER — MECLIZINE HCL 12.5 MG PO TABS: 12.5000 mg | ORAL_TABLET | Freq: Three times a day (TID) | ORAL | 0 refills | Status: DC | PRN

## 2018-12-06 MED ORDER — ONETOUCH DELICA LANCETS 33G MISC: 5 refills | Status: DC

## 2018-12-06 NOTE — Assessment & Plan Note (Signed)
Hyperlipidemia:Low fat diet discussed and encouraged.   Lipid Panel  Lab Results  Component Value Date   CHOL 210 (H) 11/16/2018   HDL 88 11/16/2018   LDLCALC 106 (H) 11/16/2018   TRIG 71 11/16/2018   CHOLHDL 2.4 11/16/2018   Not at goal, needs to lower fatty food intake no med change

## 2018-12-06 NOTE — Assessment & Plan Note (Signed)
[name] is reminded of the importance of commitment to daily physical activity for 30 minutes or more, as able and the need to limit carbohydrate intake to 30 to 60 grams per meal to help with blood sugar control.   The need to take medication as prescribed, test blood sugar as directed, and to call between visits if there is a concern that blood sugar is uncontrolled is also discussed.   [name] is reminded of the importance of daily foot exam, annual eye examination, and good blood sugar, blood pressure and cholesterol control. Medication adjusted to lower bedtime glipizide dose as pt reports that she has had to self adjust to prevent hypoglycemic episodes  Diabetic Labs Latest Ref Rng & Units 11/16/2018 02/20/2018 11/11/2017 07/13/2017 07/12/2017  HbA1c <5.7 % of total Hgb 7.7(H) 7.7(H) 7.6(H) - 7.1(H)  Microalbumin mg/dL 2.3 - - 17.9(H) -  Micro/Creat Ratio <30 mcg/mg creat 37(H) - - 22.9 -  Chol <200 mg/dL 210(H) - 189 - 197  HDL > OR = 50 mg/dL 88 - 71 - 72  Calc LDL mg/dL (calc) 106(H) - 102(H) - 109(H)  Triglycerides <150 mg/dL 71 - 71 - 69  Creatinine 0.50 - 0.99 mg/dL 0.58 0.64 0.65 - 0.74   BP/Weight 12/06/2018 11/14/2018 10/26/2018 07/13/2018 05/29/2018 02/22/2018 [id number]  Systolic BP 0000000 0000000 99991111 0000000 Q000111Q 0000000 [id number]  Diastolic BP 82 78 69 78 81 78 80  Wt. (Lbs) 145 145 146 145 146 147 146  BMI 24.89 24.89 25.06 24.89 24.3 24.46 24.3   Foot/eye exam completion dates Latest Ref Rng & Units 02/22/2018 07/13/2017  Eye Exam No Retinopathy - -  Foot exam Order - - -  Foot Form Completion - Done Done

## 2018-12-06 NOTE — Patient Instructions (Addendum)
PT TO RETURN NEXT mONDAY FOR FLU VACCINE  ANNUAL PHYSICAL EXAM WITH Md DEC 19 OR AFTER, CALL IF YOU NEED ME SOONER  PLEASE SCHEDULE AND GET MAMMOGRAM AND EYE EXAM  PLS TAKE METFORMIN TWICE EVERY DAY, HALF GLIPIZDE AT BEDTIME, WHEN NEEDED,  AND ONE IN THE MORNING SEEMS TO BE A BETTER COMBINATION FOR YOU  BLOOD PRESSURE IS GOOD AND LABS ARE GOOD OVERALL  Thanks for choosing [facility], we consider it a privelige to serve you.

## 2018-12-06 NOTE — Assessment & Plan Note (Signed)
Controlled, no change in medication DASH diet and commitment to daily physical activity for a minimum of 30 minutes discussed and encouraged, as a part of hypertension management. The importance of attaining a healthy weight is also discussed.  BP/Weight 12/06/2018 11/14/2018 10/26/2018 07/13/2018 05/29/2018 02/22/2018 [id number]  Systolic BP 0000000 0000000 99991111 0000000 Q000111Q 0000000 [id number]  Diastolic BP 82 78 69 78 81 78 80  Wt. (Lbs) 145 145 146 145 146 147 146  BMI 24.89 24.89 25.06 24.89 24.3 24.46 24.3

## 2018-12-06 NOTE — Progress Notes (Signed)
[name]     MRN: [id number]      DOB: 07/25/1950   HPI [name] is here for follow up and re-evaluation of chronic medical conditions, medication management and review of any available recent lab and radiology data.  Preventive health is updated, specifically  Cancer screening and Immunization.   Questions or concerns regarding consultations or procedures which the PT has had in the interim are  addressed. The PT denies any adverse reactions to current medications since the last visit.  Yesterday acute episode of ears feeling stopped up after this she experienced vertigo, fell to the side and was sweaty, denies pain, durATION APPROX 5 MINS  ROS Denies recent fever or chills. Denies sinus pressure, nasal congestion, ear pain or sore throat. Denies chest congestion, productive cough or wheezing. Denies chest pains, palpitations and leg swelling Denies abdominal pain, nausea, vomiting,diarrhea or constipation.   Denies dysuria, frequency, hesitancy or incontinence. Denies joint pain, swelling and limitation in mobility. Denies headaches, seizures, numbness, or tingling. Denies depression, anxiety or insomnia. Denies skin break down or rash.   PE  BP 128/82   Pulse 87   Temp 98 F (36.7 C) (Temporal)   Resp 15   Ht 5\' 4"  (1.626 m)   Wt 145 lb (65.8 kg)   SpO2 99%   BMI 24.89 kg/m    Patient alert and oriented and in no cardiopulmonary distress.  HEENT: No facial asymmetry, EOMI,   Neck supple no JVD, no mass.  Chest: Clear to auscultation bilaterally.  CVS: S1, S2 no murmurs, no S3.Regular rate.  ABD: Soft non tender.   Ext: No edema  MS: Adequate ROM spine, shoulders, hips and knees.  Skin: Intact, no ulcerations or rash noted.  Psych: Good eye contact, normal affect. Memory intact not anxious or depressed appearing.  CNS: CN 2-12 intact, power,  normal throughout.no focal deficits noted.   Assessment & Plan  Essential hypertension Controlled, no  change in medication DASH diet and commitment to daily physical activity for a minimum of 30 minutes discussed and encouraged, as a part of hypertension management. The importance of attaining a healthy weight is also discussed.  BP/Weight 12/06/2018 11/14/2018 10/26/2018 07/13/2018 05/29/2018 02/22/2018 [id number]  Systolic BP 0000000 0000000 99991111 0000000 Q000111Q 0000000 [id number]  Diastolic BP 82 78 69 78 81 78 80  Wt. (Lbs) 145 145 146 145 146 147 146  BMI 24.89 24.89 25.06 24.89 24.3 24.46 24.3       Hyperlipidemia LDL goal <100 Hyperlipidemia:Low fat diet discussed and encouraged.   Lipid Panel  Lab Results  Component Value Date   CHOL 210 (H) 11/16/2018   HDL 88 11/16/2018   LDLCALC 106 (H) 11/16/2018   TRIG 71 11/16/2018   CHOLHDL 2.4 11/16/2018   Not at goal, needs to lower fatty food intake no med change    Type 2 diabetes mellitus with other specified complication [facility]) [name] is reminded of the importance of commitment to daily physical activity for 30 minutes or more, as able and the need to limit carbohydrate intake to 30 to 60 grams per meal to help with blood sugar control.   The need to take medication as prescribed, test blood sugar as directed, and to call between visits if there is a concern that blood sugar is uncontrolled is also discussed.   [name] is reminded of the importance of daily foot exam, annual eye examination, and good blood sugar, blood pressure and cholesterol control. Medication adjusted to  lower bedtime glipizide dose as pt reports that she has had to self adjust to prevent hypoglycemic episodes  Diabetic Labs Latest Ref Rng & Units 11/16/2018 02/20/2018 11/11/2017 07/13/2017 07/12/2017  HbA1c <5.7 % of total Hgb 7.7(H) 7.7(H) 7.6(H) - 7.1(H)  Microalbumin mg/dL 2.3 - - 17.9(H) -  Micro/Creat Ratio <30 mcg/mg creat 37(H) - - 22.9 -  Chol <200 mg/dL 210(H) - 189 - 197  HDL > OR = 50 mg/dL 88 - 71 - 72  Calc LDL mg/dL (calc) 106(H) - 102(H) - 109(H)  Triglycerides <150  mg/dL 71 - 71 - 69  Creatinine 0.50 - 0.99 mg/dL 0.58 0.64 0.65 - 0.74   BP/Weight 12/06/2018 11/14/2018 10/26/2018 07/13/2018 05/29/2018 02/22/2018 [id number]  Systolic BP 0000000 0000000 99991111 0000000 Q000111Q 0000000 [id number]  Diastolic BP 82 78 69 78 81 78 80  Wt. (Lbs) 145 145 146 145 146 147 146  BMI 24.89 24.89 25.06 24.89 24.3 24.46 24.3   Foot/eye exam completion dates Latest Ref Rng & Units 02/22/2018 07/13/2017  Eye Exam No Retinopathy - -  Foot exam Order - - -  Foot Form Completion - Done Done        Vertigo Single episode yesterday, will rx meclizine for as needed use

## 2018-12-06 NOTE — Assessment & Plan Note (Signed)
Single episode yesterday, will rx meclizine for as needed use

## 2018-12-08 ENCOUNTER — Other Ambulatory Visit: Payer: Self-pay | Admitting: Family Medicine

## 2018-12-11 ENCOUNTER — Ambulatory Visit: Payer: Medicare Other

## 2018-12-11 ENCOUNTER — Other Ambulatory Visit: Payer: Self-pay

## 2018-12-12 ENCOUNTER — Ambulatory Visit (INDEPENDENT_AMBULATORY_CARE_PROVIDER_SITE_OTHER): Payer: Medicare Other

## 2018-12-12 DIAGNOSIS — Z23 Encounter for immunization: Secondary | ICD-10-CM

## 2018-12-14 ENCOUNTER — Ambulatory Visit: Payer: Medicare Other | Admitting: Family Medicine

## 2018-12-25 LAB — HM DIABETES EYE EXAM

## 2018-12-26 ENCOUNTER — Ambulatory Visit: Payer: Medicare Other | Admitting: Family Medicine

## 2018-12-28 ENCOUNTER — Encounter: Payer: Self-pay | Admitting: Family Medicine

## 2018-12-28 ENCOUNTER — Other Ambulatory Visit: Payer: Self-pay

## 2018-12-28 ENCOUNTER — Ambulatory Visit (INDEPENDENT_AMBULATORY_CARE_PROVIDER_SITE_OTHER): Payer: Medicare Other | Admitting: Family Medicine

## 2018-12-28 VITALS — BP 128/82 | HR 87 | Resp 15 | Ht 64.0 in | Wt 145.0 lb

## 2018-12-28 DIAGNOSIS — Z0000 Encounter for general adult medical examination without abnormal findings: Secondary | ICD-10-CM | POA: Diagnosis not present

## 2018-12-28 MED ORDER — LANTUS SOLOSTAR 100 UNIT/ML ~~LOC~~ SOPN: PEN_INJECTOR | SUBCUTANEOUS | 3 refills | Status: DC

## 2018-12-28 NOTE — Patient Instructions (Addendum)
[name] , Thank you for taking time to come for your Medicare Wellness Visit. I appreciate your ongoing commitment to your health goals. Please review the following plan we discussed and let me know if I can assist you in the future.   Please continue to practice social distancing to keep you, your family, and our community safe.  If you must go out, please wear a Mask and practice good handwashing.  Screening recommendations/referrals: Colonoscopy: Due 2023 Mammogram: Please schedule when she can Bone Density: up to date Recommended yearly ophthalmology/optometry visit for glaucoma screening and checkup Recommended yearly dental visit for hygiene and checkup  Vaccinations: Influenza vaccine: Completed due again fall 2021 Pneumococcal vaccine: Completed Tdap vaccine: Due 2024 Shingles vaccine: Completed  Advanced directives: Please let us know if you have any questions or concerns required to help you with the healthcare side of this.  Conditions/risks identified: Fall  Next appointment: 02/28/2019   Preventive Care 69 Years and Older, Female Preventive care refers to lifestyle choices and visits with your health care provider that can promote health and wellness. What does preventive care include?  A yearly physical exam. This is also called an annual well check.  Dental exams once or twice a year.  Routine eye exams. Ask your health care provider how often you should have your eyes checked.  Personal lifestyle choices, including:  Daily care of your teeth and gums.  Regular physical activity.  Eating a healthy diet.  Avoiding tobacco and drug use.  Limiting alcohol use.  Practicing safe sex.  Taking low-dose aspirin every day.  Taking vitamin and mineral supplements as recommended by your health care provider. What happens during an annual well check? The services and screenings done by your health care provider during your annual well check will depend on  your age, overall health, lifestyle risk factors, and family history of disease. Counseling  Your health care provider may ask you questions about your:  Alcohol use.  Tobacco use.  Drug use.  Emotional well-being.  Home and relationship well-being.  Sexual activity.  Eating habits.  History of falls.  Memory and ability to understand (cognition).  Work and work Statistician.  Reproductive health. Screening  You may have the following tests or measurements:  Height, weight, and BMI.  Blood pressure.  Lipid and cholesterol levels. These may be checked every 5 years, or more frequently if you are over 39 years old.  Skin check.  Lung cancer screening. You may have this screening every year starting at age 66 if you have a 30-pack-year history of smoking and currently smoke or have quit within the past 15 years.  Fecal occult blood test (FOBT) of the stool. You may have this test every year starting at age 58.  Flexible sigmoidoscopy or colonoscopy. You may have a sigmoidoscopy every 5 years or a colonoscopy every 10 years starting at age 63.  Hepatitis C blood test.  Hepatitis B blood test.  Sexually transmitted disease (STD) testing.  Diabetes screening. This is done by checking your blood sugar (glucose) after you have not eaten for a while (fasting). You may have this done every 1-3 years.  Bone density scan. This is done to screen for osteoporosis. You may have this done starting at age 30.  Mammogram. This may be done every 1-2 years. Talk to your health care provider about how often you should have regular mammograms. Talk with your health care provider about your test results, treatment options, and if necessary,  the need for more tests. Vaccines  Your health care provider may recommend certain vaccines, such as:  Influenza vaccine. This is recommended every year.  Tetanus, diphtheria, and acellular pertussis (Tdap, Td) vaccine. You may need a Td booster  every 10 years.  Zoster vaccine. You may need this after age 46.  Pneumococcal 13-valent conjugate (PCV13) vaccine. One dose is recommended after age 33.  Pneumococcal polysaccharide (PPSV23) vaccine. One dose is recommended after age 23. Talk to your health care provider about which screenings and vaccines you need and how often you need them. This information is not intended to replace advice given to you by your health care provider. Make sure you discuss any questions you have with your health care provider. Document Released: 03/21/2015 Document Revised: 11/12/2015 Document Reviewed: 12/24/2014 Elsevier Interactive Patient Education  2017 [place] Prevention in the Home Falls can cause injuries. They can happen to people of all ages. There are many things you can do to make your home safe and to help prevent falls. What can I do on the outside of my home?  Regularly fix the edges of walkways and driveways and fix any cracks.  Remove anything that might make you trip as you walk through a door, such as a raised step or threshold.  Trim any bushes or trees on the path to your home.  Use bright outdoor lighting.  Clear any walking paths of anything that might make someone trip, such as rocks or tools.  Regularly check to see if handrails are loose or broken. Make sure that both sides of any steps have handrails.  Any raised decks and porches should have guardrails on the edges.  Have any leaves, snow, or ice cleared regularly.  Use sand or salt on walking paths during winter.  Clean up any spills in your garage right away. This includes oil or grease spills. What can I do in the bathroom?  Use night lights.  Install grab bars by the toilet and in the tub and shower. Do not use towel bars as grab bars.  Use non-skid mats or decals in the tub or shower.  If you need to sit down in the shower, use a plastic, non-slip stool.  Keep the floor dry. Clean up any  water that spills on the floor as soon as it happens.  Remove soap buildup in the tub or shower regularly.  Attach bath mats securely with double-sided non-slip rug tape.  Do not have throw rugs and other things on the floor that can make you trip. What can I do in the bedroom?  Use night lights.  Make sure that you have a light by your bed that is easy to reach.  Do not use any sheets or blankets that are too big for your bed. They should not hang down onto the floor.  Have a firm chair that has side arms. You can use this for support while you get dressed.  Do not have throw rugs and other things on the floor that can make you trip. What can I do in the kitchen?  Clean up any spills right away.  Avoid walking on wet floors.  Keep items that you use a lot in easy-to-reach places.  If you need to reach something above you, use a strong step stool that has a grab bar.  Keep electrical cords out of the way.  Do not use floor polish or wax that makes floors slippery. If you must use  wax, use non-skid floor wax.  Do not have throw rugs and other things on the floor that can make you trip. What can I do with my stairs?  Do not leave any items on the stairs.  Make sure that there are handrails on both sides of the stairs and use them. Fix handrails that are broken or loose. Make sure that handrails are as long as the stairways.  Check any carpeting to make sure that it is firmly attached to the stairs. Fix any carpet that is loose or worn.  Avoid having throw rugs at the top or bottom of the stairs. If you do have throw rugs, attach them to the floor with carpet tape.  Make sure that you have a light switch at the top of the stairs and the bottom of the stairs. If you do not have them, ask someone to add them for you. What else can I do to help prevent falls?  Wear shoes that:  Do not have high heels.  Have rubber bottoms.  Are comfortable and fit you well.  Are closed  at the toe. Do not wear sandals.  If you use a stepladder:  Make sure that it is fully opened. Do not climb a closed stepladder.  Make sure that both sides of the stepladder are locked into place.  Ask someone to hold it for you, if possible.  Clearly mark and make sure that you can see:  Any grab bars or handrails.  First and last steps.  Where the edge of each step is.  Use tools that help you move around (mobility aids) if they are needed. These include:  Canes.  Walkers.  Scooters.  Crutches.  Turn on the lights when you go into a dark area. Replace any light bulbs as soon as they burn out.  Set up your furniture so you have a clear path. Avoid moving your furniture around.  If any of your floors are uneven, fix them.  If there are any pets around you, be aware of where they are.  Review your medicines with your doctor. Some medicines can make you feel dizzy. This can increase your chance of falling. Ask your doctor what other things that you can do to help prevent falls. This information is not intended to replace advice given to you by your health care provider. Make sure you discuss any questions you have with your health care provider. Document Released: 12/19/2008 Document Revised: 07/31/2015 Document Reviewed: 03/29/2014 Elsevier Interactive Patient Education  2017 [employer].

## 2018-12-28 NOTE — Progress Notes (Signed)
Subjective:   [name] is a 68 y.o. female who presents for Medicare Annual (Subsequent) preventive examination.  Location of Patient: Home Location of Provider: Telehealth Consent was obtain for visit to be over via telehealth.  I verified that I am speaking with the correct person using two identifiers.   Review of Systems:    Cardiac Risk Factors include: advanced age (>43men, >34 women);diabetes mellitus;dyslipidemia;hypertension     Objective:     Vitals: BP 128/82   Pulse 87   Resp 15   Ht 5\' 4"  (1.626 m)   Wt 145 lb (65.8 kg)   BMI 24.89 kg/m   Body mass index is 24.89 kg/m.  Advanced Directives 10/26/2018 12/26/2017 12/27/2016 10/28/2016 06/19/2016 05/31/2016 05/25/2016  Does Patient Have a Medical Advance Directive? No No No No No No No  Would patient like information on creating a medical advance directive? No - Patient declined Yes (ED - Information included in AVS) (No Data) - No - Patient declined No - Patient declined No - Patient declined    Tobacco Social History   Tobacco Use  Smoking Status Never Smoker  Smokeless Tobacco Never Used     Counseling given: Yes   Clinical Intake:  Pre-visit preparation completed: Yes  Pain : 0-10 Pain Score: 5  Pain Type: Acute pain Pain Location: Neck Pain Orientation: Posterior Pain Descriptors / Indicators: Sore Pain Onset: In the past 7 days Pain Frequency: Constant Effect of Pain on Daily Activities: a little     BMI - recorded: 24.89 Nutritional Status: BMI of 19-24  Normal Nutritional Risks: None Diabetes: Yes CBG done?: No Did pt. bring in CBG monitor from home?: No  How often do you need to have someone help you when you read instructions, pamphlets, or other written materials from your doctor or pharmacy?: 1 - Never What is the last grade level you completed in school?: 12  Interpreter Needed?: No     Past Medical History:  Diagnosis Date  . Essential hypertension   .  Headache(784.0)   . Hematuria 02/24/2016  . History of kidney stones   . Hyperlipidemia   . Irregular heart beats   . Type 2 diabetes mellitus ([place])   . Vaginitis    Past Surgical History:  Procedure Laterality Date  . COLONOSCOPY N/A 10/26/2018   Procedure: COLONOSCOPY;  Surgeon: Rogene Houston, MD;  [facility];  Service: Endoscopy;  Laterality: N/A;  830-10:30am  . cyst removed Right 1998   cyst- removed from right wrist  . EXTRACORPOREAL SHOCK WAVE LITHOTRIPSY Right 05/29/2018   Procedure: EXTRACORPOREAL SHOCK WAVE LITHOTRIPSY (ESWL);  Surgeon: Kathie Rhodes, MD;  Location: [facility];  Service: Urology;  Laterality: Right;  . lithotrpsy     over 20 years  . NEPHROLITHOTOMY Right 05/31/2016   Procedure: NEPHROLITHOTOMY PERCUTANEOUS WITH SURGEON ACCESS;  Surgeon: Cleon Gustin, MD;  Location: [facility];  Service: Urology;  Laterality: Right;  . POLYPECTOMY  10/26/2018   Procedure: POLYPECTOMY;  Surgeon: Rogene Houston, MD;  [facility];  Service: Endoscopy;;  colon  . TOTAL ABDOMINAL HYSTERECTOMY W/ BILATERAL SALPINGOOPHORECTOMY  1998  . TUBAL LIGATION     Family History  Problem Relation Age of Onset  . Alcohol abuse Mother   . Esophageal cancer Father   . Alcohol abuse Father   . Diabetes Sister   . Diabetes Sister   . Hypertension Sister   . Heart attack Sister  in 40's  . Seizures Brother    Social History   Socioeconomic History  . Marital status: Single    Spouse name: Not on file  . Number of children: 1  . Years of education: Not on file  . Highest education level: 12th grade  Occupational History  . Occupation: employed   Scientific laboratory technician  . Financial resource strain: Not hard at all  . Food insecurity    Worry: Never true    Inability: Never true  . Transportation needs    Medical: No    Non-medical: No  Tobacco Use  . Smoking status: Never Smoker  . Smokeless tobacco: Never Used  Substance and Sexual Activity  . Alcohol  use: No  . Drug use: No  . Sexual activity: Not Currently  Lifestyle  . Physical activity    Days per week: 0 days    Minutes per session: 0 min  . Stress: Only a little  Relationships  . Social Herbalist on phone: Twice a week    Gets together: Once a week    Attends religious service: More than 4 times per year    Active member of club or organization: Yes    Attends meetings of clubs or organizations: 1 to 4 times per year    Relationship status: Never married  Other Topics Concern  . Not on file  Social History Narrative  . Not on file    Outpatient Encounter Medications as of 12/28/2018  Medication Sig  . acetaminophen (TYLENOL) 500 MG tablet Take 1,000 mg by mouth every 6 (six) hours as needed (for headaches/pain.).  Marland Kitchen amLODipine (NORVASC) 5 MG tablet Take 1 tablet (5 mg total) by mouth daily.  Marland Kitchen aspirin EC 81 MG tablet Take 1 tablet (81 mg total) by mouth daily.  Marland Kitchen atorvastatin (LIPITOR) 10 MG tablet TAKE HALF A TABLET BY MOUTH DAILY (Patient taking differently: Take 5 mg by mouth every evening. )  . CALCIUM PO Take 1,000 mg by mouth daily.  . Cholecalciferol (VITAMIN D3) 50 MCG (2000 UT) TABS Take 2,000 Units by mouth daily.  . clotrimazole-betamethasone (LOTRISONE) cream Apply 1 application topically 2 (two) times daily. Apply to twice daily to rash under arms, breasts and in groin for 14 days, then as needed  . glipiZIDE (GLUCOTROL) 10 MG tablet TAKE 1 TABLET BY MOUTH TWICE A DAY BEFORE MEALS  . Insulin Pen Needle (B-D ULTRAFINE III SHORT PEN) 31G X 8 MM MISC FOR USE WITH INSULIN ONCE DAILY DX E11.9  . LANTUS SOLOSTAR 100 UNIT/ML Solostar Pen INJECT 22 UNITS INTO THE SKIN DAILY AT 10 PM. (Patient taking differently: Inject 22 Units into the skin at bedtime. )  . meclizine (ANTIVERT) 12.5 MG tablet Take 1 tablet (12.5 mg total) by mouth 3 (three) times daily as needed for dizziness.  . metFORMIN (GLUCOPHAGE) 1000 MG tablet TAKE 1 TABLET BY MOUTH TWICE A DAY WITH  MEALS  . Multiple Vitamin (MULTIVITAMIN WITH MINERALS) TABS tablet Take 1 tablet by mouth daily.  Marland Kitchen nystatin ointment (MYCOSTATIN) Apply 1 application topically 2 (two) times daily. (Patient taking differently: Apply 1 application topically 2 (two) times daily as needed (skin irritation). )  . OneTouch Delica Lancets 99991111 MISC Once daily dx e11.9 (give lancects that go with pts meter)  . ONETOUCH ULTRA test strip USE TO TEST BLOOD SUGAR 3 TIMES A DAY AS DIRECTED DX E11.65  . Polyethyl Glycol-Propyl Glycol (LUBRICANT EYE DROPS) 0.4-0.3 % SOLN Place 1  drop into both eyes 3 (three) times daily as needed (dry/irritated eyes.).   No facility-administered encounter medications on file as of 12/28/2018.     Activities of Daily Living In your present state of health, do you have any difficulty performing the following activities: 12/28/2018 05/29/2018  Hearing? N N  Vision? Y N  Comment went to the eye doctor Monday 10/19 -  Difficulty concentrating or making decisions? Y N  Comment a little -  Walking or climbing stairs? N N  Dressing or bathing? N N  Doing errands, shopping? N -  Preparing Food and eating ? N -  Using the Toilet? N -  In the past six months, have you accidently leaked urine? N -  Do you have problems with loss of bowel control? N -  Managing your Medications? N -  Managing your Finances? N -  Housekeeping or managing your Housekeeping? N -  Some recent data might be hidden    Patient Care Team: Fayrene Helper, MD as PCP - General Williams Che, MD (Inactive) (Ophthalmology)    Assessment:   This is a routine wellness examination for [name].  Exercise Activities and Dietary recommendations Current Exercise Habits: Home exercise routine, Type of exercise: walking, Time (Minutes): > 60, Frequency (Times/Week): 2, Weekly Exercise (Minutes/Week): 0, Intensity: Mild, Exercise limited by: None identified  Goals    . DIET - INCREASE WATER INTAKE    . Eat more  fruits and vegetables    . Exercise 3x per week (30 min per time)    . Increase physical activity     Walking for 30 mins for 3 days a week        Fall Risk Fall Risk  12/28/2018 07/13/2018 02/22/2018 12/26/2017 11/15/2017  Falls in the past year? 1 0 0 No No  Number falls in past yr: 0 - - - -  Injury with Fall? 1 0 - - -  Risk for fall due to : - - - - -   Is the patient's home free of loose throw rugs in walkways, pet beds, electrical cords, etc?   yes      Grab bars in the bathroom? yes      Handrails on the stairs?   yes      Adequate lighting?   yes     Depression Screen PHQ 2/9 Scores 12/28/2018 07/13/2018 02/22/2018 12/26/2017  PHQ - 2 Score 0 0 1 0  PHQ- 9 Score - - 2 -     Cognitive Function     6CIT Screen 12/28/2018 12/26/2017 12/27/2016 02/24/2016  What Year? 0 points 0 points 0 points 0 points  What month? 0 points 0 points 0 points 0 points  What time? 0 points 0 points 0 points 0 points  Count back from 20 0 points 0 points 0 points 0 points  Months in reverse 0 points 0 points 0 points 0 points  Repeat phrase 0 points 4 points 0 points 0 points  Total Score 0 4 0 0    Immunization History  Administered Date(s) Administered  . Influenza Split 04/24/2012  . Influenza,inj,Quad PF,6+ Mos 01/25/2014, 03/14/2015, 05/24/2016, 12/27/2016, 11/15/2017, 12/12/2018  . Pneumococcal Conjugate-13 05/24/2016  . Pneumococcal Polysaccharide-23 12/26/2017  . Tdap 04/24/2012  . Zoster 09/13/2013    Qualifies for Shingles Vaccine? completed  Screening Tests Health Maintenance  Topic Date Due  . OPHTHALMOLOGY EXAM  01/21/2018  . FOOT EXAM  02/25/2019  . HEMOGLOBIN A1C  05/16/2019  .  MAMMOGRAM  08/05/2019  . URINE MICROALBUMIN  11/16/2019  . COLONOSCOPY  10/25/2021  . TETANUS/TDAP  04/24/2022  . INFLUENZA VACCINE  Completed  . DEXA SCAN  Completed  . Hepatitis C Screening  Completed  . PNA vac Low Risk Adult  Completed    Cancer Screenings: Lung: Low Dose CT  Chest recommended if Age 51-80 years, 30 pack-year currently smoking OR have quit w/in 15years.  Patient does not qualify. Breast:  Up to date on Mammogram? Yes   Up to date of Bone Density/Dexa? Yes Colorectal:  Due 2023  Additional Screenings:   Hepatitis C Screening: completed     Plan:       1. Encounter for Medicare annual wellness exam  I have personally reviewed and noted the following in the patient's chart:   . Medical and social history . Use of alcohol, tobacco or illicit drugs  . Current medications and supplements . Functional ability and status . Nutritional status . Physical activity . Advanced directives . List of other physicians . Hospitalizations, surgeries, and ER visits in previous 12 months . Vitals . Screenings to include cognitive, depression, and falls . Referrals and appointments  In addition, I have reviewed and discussed with patient certain preventive protocols, quality metrics, and best practice recommendations. A written personalized care plan for preventive services as well as general preventive health recommendations were provided to patient.   I provided 20 minutes of non-face-to-face time during this encounter.   Perlie Mayo, NP  12/28/2018

## 2019-01-05 ENCOUNTER — Other Ambulatory Visit: Payer: Self-pay | Admitting: Family Medicine

## 2019-01-08 ENCOUNTER — Ambulatory Visit (HOSPITAL_COMMUNITY)
Admission: RE | Admit: 2019-01-08 | Discharge: 2019-01-08 | Disposition: A | Discharge status: Home / Self Care | Payer: Medicare Other | Source: Ambulatory Visit | Attending: Family Medicine | Admitting: Family Medicine

## 2019-01-08 ENCOUNTER — Other Ambulatory Visit: Payer: Self-pay

## 2019-01-08 DIAGNOSIS — Z1231 Encounter for screening mammogram for malignant neoplasm of breast: Secondary | ICD-10-CM | POA: Insufficient documentation

## 2019-01-10 ENCOUNTER — Other Ambulatory Visit (HOSPITAL_COMMUNITY): Payer: Self-pay | Admitting: Family Medicine

## 2019-01-10 DIAGNOSIS — R928 Other abnormal and inconclusive findings on diagnostic imaging of breast: Secondary | ICD-10-CM

## 2019-01-16 ENCOUNTER — Ambulatory Visit (HOSPITAL_COMMUNITY)
Admission: RE | Admit: 2019-01-16 | Discharge: 2019-01-16 | Disposition: A | Discharge status: Home / Self Care | Payer: Medicare Other | Source: Ambulatory Visit | Attending: Family Medicine | Admitting: Family Medicine

## 2019-01-16 ENCOUNTER — Other Ambulatory Visit: Payer: Self-pay

## 2019-01-16 ENCOUNTER — Ambulatory Visit (HOSPITAL_COMMUNITY): Admission: RE | Admit: 2019-01-16 | Payer: Medicare Other | Source: Ambulatory Visit

## 2019-01-16 DIAGNOSIS — R928 Other abnormal and inconclusive findings on diagnostic imaging of breast: Secondary | ICD-10-CM | POA: Insufficient documentation

## 2019-01-16 DIAGNOSIS — R922 Inconclusive mammogram: Secondary | ICD-10-CM | POA: Diagnosis not present

## 2019-02-19 ENCOUNTER — Other Ambulatory Visit: Payer: Self-pay | Admitting: Family Medicine

## 2019-02-19 ENCOUNTER — Telehealth: Payer: Self-pay | Admitting: *Deleted

## 2019-02-19 ENCOUNTER — Other Ambulatory Visit: Payer: Self-pay

## 2019-02-19 MED ORDER — METFORMIN HCL 1000 MG PO TABS: 1000.0000 mg | ORAL_TABLET | Freq: Two times a day (BID) | ORAL | 1 refills | Status: DC

## 2019-02-19 NOTE — Telephone Encounter (Signed)
Pt needs this metformin sent to [facility] today she is almost completely out

## 2019-02-19 NOTE — Telephone Encounter (Signed)
Med sent to [facility]

## 2019-02-28 ENCOUNTER — Encounter: Payer: Medicare Other | Admitting: Family Medicine

## 2019-03-12 ENCOUNTER — Other Ambulatory Visit: Payer: Self-pay | Admitting: Family Medicine

## 2019-04-09 ENCOUNTER — Encounter: Payer: Medicare Other | Admitting: Family Medicine

## 2019-04-11 ENCOUNTER — Encounter: Payer: Medicare Other | Admitting: Family Medicine

## 2019-06-05 ENCOUNTER — Other Ambulatory Visit: Payer: Self-pay

## 2019-06-05 ENCOUNTER — Ambulatory Visit (INDEPENDENT_AMBULATORY_CARE_PROVIDER_SITE_OTHER): Payer: Medicare Other | Admitting: Family Medicine

## 2019-06-05 ENCOUNTER — Encounter: Payer: Self-pay | Admitting: Family Medicine

## 2019-06-05 VITALS — BP 140/78 | HR 94 | Temp 97.3°F | Ht 63.0 in | Wt 147.2 lb

## 2019-06-05 DIAGNOSIS — R200 Anesthesia of skin: Secondary | ICD-10-CM | POA: Diagnosis not present

## 2019-06-05 MED ORDER — CYCLOBENZAPRINE HCL 10 MG PO TABS: 10.0000 mg | ORAL_TABLET | Freq: Every day | ORAL | 0 refills | Status: DC

## 2019-06-05 NOTE — Patient Instructions (Addendum)
  Flexeril 10mg  -trial of 1/2 at night, heating pad Lumbar xray  If you have lab work done today you will be contacted with your lab results within the next 2 weeks.  If you have not heard from [place] then please contact us. The fastest way to get your results is to register for My Chart.   IF you received an x-ray today, you will receive an invoice from [facility]. Please contact [facility] at [phone] with questions or concerns regarding your invoice.   IF you received labwork today, you will receive an invoice from [facility]. Please contact [facility] at [phone] with questions or concerns regarding your invoice.   Our billing staff will not be able to assist you with questions regarding bills from these companies.  You will be contacted with the lab results as soon as they are available. The fastest way to get your results is to activate your My Chart account. Instructions are located on the last page of this paperwork. If you have not heard from [place] regarding the results in 2 weeks, please contact this office.

## 2019-06-05 NOTE — Progress Notes (Signed)
Established Patient Office Visit  Subjective:  Patient ID: [name], female    DOB: Jul 21, 1950  Age: 69 y.o. MRN: [id number]  CC:  Chief Complaint  Patient presents with  . Leg Swelling    legs feel achy and tired. Right leg back of thigh feels swollen on back side. This ahs been going on for the past 4 days    HPI [employer] presents for numbness and tingling noted in the bilat legs-lateral legs. Pt with concern for tenderness in the hamstring -right, no pain in the left.  No skin changes. Pt takes asa daily. No h/o clots  Past Medical History:  Diagnosis Date  . Essential hypertension   . Headache(784.0)   . Hematuria 02/24/2016  . History of kidney stones   . Hyperlipidemia   . Irregular heart beats   . Type 2 diabetes mellitus ([place])   . Vaginitis     Past Surgical History:  Procedure Laterality Date  . COLONOSCOPY N/A 10/26/2018   Procedure: COLONOSCOPY;  Surgeon: Natalie Houston, MD;  [facility];  Service: Endoscopy;  Laterality: N/A;  830-10:30am  . cyst removed Right 1998   cyst- removed from right wrist  . EXTRACORPOREAL SHOCK WAVE LITHOTRIPSY Right 05/29/2018   Procedure: EXTRACORPOREAL SHOCK WAVE LITHOTRIPSY (ESWL);  Surgeon: Natalie Rhodes, MD;  [facility];  Service: Urology;  Laterality: Right;  . lithotrpsy     over 20 years  . NEPHROLITHOTOMY Right 05/31/2016   Procedure: NEPHROLITHOTOMY PERCUTANEOUS WITH SURGEON ACCESS;  Surgeon: Natalie Gustin, MD;  [facility];  Service: Urology;  Laterality: Right;  . POLYPECTOMY  10/26/2018   Procedure: POLYPECTOMY;  Surgeon: Natalie Houston, MD;  [facility];  Service: Endoscopy;;  colon  . TOTAL ABDOMINAL HYSTERECTOMY W/ BILATERAL SALPINGOOPHORECTOMY  1998  . TUBAL LIGATION      Family History  Problem Relation Age of Onset  . Alcohol abuse Mother   . Esophageal cancer Father   . Alcohol abuse Father   . Diabetes Sister   . Diabetes Sister   . Hypertension  Sister   . Heart attack Sister        in 62's  . Seizures Brother     Social History   Socioeconomic History  . Marital status: Single    Spouse name: Not on file  . Number of children: 1  . Years of education: Not on file  . Highest education level: 12th grade  Occupational History  . Occupation: employed   Tobacco Use  . Smoking status: Never Smoker  . Smokeless tobacco: Never Used  Substance and Sexual Activity  . Alcohol use: No  . Drug use: No  . Sexual activity: Not Currently  Other Topics Concern  . Not on file  Social History Narrative  . Not on file   Social Determinants of Health   Financial Resource Strain:   . Difficulty of Paying Living Expenses:   Food Insecurity:   . Worried About Charity fundraiser in the Last Year:   . Arboriculturist in the Last Year:   Transportation Needs:   . Film/video editor (Medical):   [name] Kitchen Lack of Transportation (Non-Medical):   Physical Activity:   . Days of Exercise per Week:   . Minutes of Exercise per Session:   Stress:   . Feeling of Stress :   Social Connections:   . Frequency of Communication with Friends and Family:   . Frequency  of Social Gatherings with Friends and Family:   . Attends Religious Services:   . Active Member of Clubs or Organizations:   . Attends Archivist Meetings:   [name] Kitchen Marital Status:   Intimate Partner Violence:   . Fear of Current or Ex-Partner:   . Emotionally Abused:   [name] Kitchen Physically Abused:   . Sexually Abused:     Outpatient Medications Prior to Visit  Medication Sig Dispense Refill  . acetaminophen (TYLENOL) 500 MG tablet Take 1,000 mg by mouth every 6 (six) hours as needed (for headaches/pain.).    [name] Kitchen amLODipine (NORVASC) 5 MG tablet TAKE 1 TABLET BY MOUTH EVERY DAY 90 tablet 1  . aspirin EC 81 MG tablet Take 1 tablet (81 mg total) by mouth daily.    [name] Kitchen atorvastatin (LIPITOR) 10 MG tablet TAKE HALF A TABLET BY MOUTH DAILY 45 tablet 1  . CALCIUM PO Take 1,000 mg by mouth  daily.    . Cholecalciferol (VITAMIN D3) 50 MCG (2000 UT) TABS Take 2,000 Units by mouth daily.    . clotrimazole-betamethasone (LOTRISONE) cream Apply 1 application topically 2 (two) times daily. Apply to twice daily to rash under arms, breasts and in groin for 14 days, then as needed 45 g 1  . glipiZIDE (GLUCOTROL) 10 MG tablet TAKE 1 TABLET BY MOUTH TWICE A DAY BEFORE MEALS 180 tablet 3  . Insulin Pen Needle (B-D ULTRAFINE III SHORT PEN) 31G X 8 MM MISC FOR USE WITH INSULIN ONCE DAILY DX E11.9 100 each 2  . LANTUS SOLOSTAR 100 UNIT/ML Solostar Pen INJECT 22 UNITS INTO THE SKIN DAILY AT 10 PM. 15 mL 4  . metFORMIN (GLUCOPHAGE) 1000 MG tablet Take 1 tablet (1,000 mg total) by mouth 2 (two) times daily with a meal. 180 tablet 1  . Multiple Vitamin (MULTIVITAMIN WITH MINERALS) TABS tablet Take 1 tablet by mouth daily.    [name] Kitchen nystatin ointment (MYCOSTATIN) Apply 1 application topically 2 (two) times daily. (Patient taking differently: Apply 1 application topically 2 (two) times daily as needed (skin irritation). ) 30 g 0  . OneTouch Delica Lancets 99991111 MISC Once daily dx e11.9 (give lancects that go with pts meter) 100 each 5  . ONETOUCH ULTRA test strip USE TO TEST BLOOD SUGAR 3 TIMES A DAY AS DIRECTED DX E11.65 100 strip 5  . Polyethyl Glycol-Propyl Glycol (LUBRICANT EYE DROPS) 0.4-0.3 % SOLN Place 1 drop into both eyes 3 (three) times daily as needed (dry/irritated eyes.).    [name] Kitchen meclizine (ANTIVERT) 12.5 MG tablet Take 1 tablet (12.5 mg total) by mouth 3 (three) times daily as needed for dizziness. 30 tablet 0   No facility-administered medications prior to visit.    Allergies  Allergen Reactions  . Onglyza [Saxagliptin Hydrochloride] Swelling    Tongue swell  . Pravastatin Other (See Comments)    Muscle aches  . Ace Inhibitors Cough    ROS Review of Systems  Respiratory: Negative.   Cardiovascular: Positive for leg swelling. Negative for palpitations.  Musculoskeletal: Positive for  arthralgias. Negative for gait problem, neck pain and neck stiffness.      Objective:    Physical Exam  Constitutional: She is oriented to person, place, and time. She appears well-developed and well-nourished.  Musculoskeletal:        General: Tenderness present. No deformity or edema.     Right upper leg: Tenderness present.       Legs:     Comments: Tenderness noted with palpation  Neurological: She  is oriented to person, place, and time.  Psychiatric: She has a normal mood and affect. Her behavior is normal.    BP 140/78 (BP Location: Right Arm, Patient Position: Sitting, Cuff Size: Normal)   Pulse 94   Temp (!) 97.3 F (36.3 C) (Temporal)   Ht 5\' 3"  (1.6 m)   Wt 147 lb 3.2 oz (66.8 kg)   SpO2 97%   BMI 26.08 kg/m  Wt Readings from Last 3 Encounters:  06/05/19 147 lb 3.2 oz (66.8 kg)  12/28/18 145 lb (65.8 kg)  12/06/18 145 lb (65.8 kg)     Health Maintenance Due  Topic Date Due  . FOOT EXAM  02/25/2019  . HEMOGLOBIN A1C  05/16/2019    Lab Results  Component Value Date   TSH 0.72 02/20/2018   Lab Results  Component Value Date   WBC 4.7 11/16/2018   HGB 12.3 11/16/2018   HCT 38.1 11/16/2018   MCV 88.6 11/16/2018   PLT 362 11/16/2018   Lab Results  Component Value Date   NA 140 11/16/2018   K 3.7 11/16/2018   CO2 29 11/16/2018   GLUCOSE 80 11/16/2018   BUN 16 11/16/2018   CREATININE 0.58 11/16/2018   BILITOT 0.7 11/16/2018   ALKPHOS 103 06/19/2016   AST 14 11/16/2018   ALT 13 11/16/2018   PROT 7.4 11/16/2018   ALBUMIN 4.5 06/19/2016   CALCIUM 9.7 11/16/2018   ANIONGAP 11 06/19/2016   Lab Results  Component Value Date   CHOL 210 (H) 11/16/2018   Lab Results  Component Value Date   HDL 88 11/16/2018   Lab Results  Component Value Date   LDLCALC 106 (H) 11/16/2018   Lab Results  Component Value Date   TRIG 71 11/16/2018   Lab Results  Component Value Date   CHOLHDL 2.4 11/16/2018   Lab Results  Component Value Date   HGBA1C 7.7  (H) 11/16/2018      Assessment & Plan:  1. Bilateral leg numbness H/o of cervical disc disease-no evaluation for lower back-heating pad, flexeril 10mg -rx-risk/benefit/side effects d/w pt - DG Lumbar Spine Complete; Future. Tenderness right hamstring , no skin change, no mass Follow-up:  Flexeril, lumbar xray   Aldine Chakraborty Hannah Beat, MD

## 2019-06-20 ENCOUNTER — Telehealth: Payer: Self-pay | Admitting: Family Medicine

## 2019-06-20 DIAGNOSIS — E1169 Type 2 diabetes mellitus with other specified complication: Secondary | ICD-10-CM

## 2019-06-20 DIAGNOSIS — Z794 Long term (current) use of insulin: Secondary | ICD-10-CM

## 2019-06-20 DIAGNOSIS — E785 Hyperlipidemia, unspecified: Secondary | ICD-10-CM

## 2019-06-20 DIAGNOSIS — I10 Essential (primary) hypertension: Secondary | ICD-10-CM

## 2019-06-20 NOTE — Telephone Encounter (Signed)
Needs fasting lipid, cmp and EgfR and HBa1C, past due, last in 11/2018, I will review when done

## 2019-06-21 ENCOUNTER — Encounter: Payer: Medicare Other | Admitting: Family Medicine

## 2019-06-21 NOTE — Addendum Note (Signed)
Addended by: Eual Fines on: 06/21/2019 08:38 AM   Modules accepted: Orders

## 2019-06-29 DIAGNOSIS — Z794 Long term (current) use of insulin: Secondary | ICD-10-CM | POA: Diagnosis not present

## 2019-06-29 DIAGNOSIS — E785 Hyperlipidemia, unspecified: Secondary | ICD-10-CM | POA: Diagnosis not present

## 2019-06-29 DIAGNOSIS — I10 Essential (primary) hypertension: Secondary | ICD-10-CM | POA: Diagnosis not present

## 2019-06-29 DIAGNOSIS — E1169 Type 2 diabetes mellitus with other specified complication: Secondary | ICD-10-CM | POA: Diagnosis not present

## 2019-06-30 LAB — COMPLETE METABOLIC PANEL WITH GFR
AG Ratio: 1.8 (calc) (ref 1.0–2.5)
ALT: 13 U/L (ref 6–29)
AST: 12 U/L (ref 10–35)
Albumin: 4.8 g/dL (ref 3.6–5.1)
Alkaline phosphatase (APISO): 123 U/L (ref 37–153)
BUN/Creatinine Ratio: 25 (calc) — ABNORMAL HIGH (ref 6–22)
BUN: 14 mg/dL (ref 7–25)
CO2: 28 mmol/L (ref 20–32)
Calcium: 9.9 mg/dL (ref 8.6–10.4)
Chloride: 102 mmol/L (ref 98–110)
Creat: 0.56 mg/dL — ABNORMAL LOW (ref 0.60–0.93)
GFR, Est African American: 109 mL/min/{1.73_m2} (ref >=60)
GFR, Est Non African American: 94 mL/min/{1.73_m2} (ref >=60)
Globulin: 2.6 g/dL (calc) (ref 1.9–3.7)
Glucose, Bld: 126 mg/dL — ABNORMAL HIGH (ref 65–99)
Potassium: 3.9 mmol/L (ref 3.5–5.3)
Sodium: 142 mmol/L (ref 135–146)
Total Bilirubin: 0.5 mg/dL (ref 0.2–1.2)
Total Protein: 7.4 g/dL (ref 6.1–8.1)

## 2019-06-30 LAB — LIPID PANEL
Cholesterol: 217 mg/dL — ABNORMAL HIGH (ref <200)
HDL: 86 mg/dL (ref >=50)
LDL Cholesterol (Calc): 116 mg/dL (calc) — ABNORMAL HIGH
Non-HDL Cholesterol (Calc): 131 mg/dL (calc) — ABNORMAL HIGH (ref <130)
Total CHOL/HDL Ratio: 2.5 (calc) (ref <5.0)
Triglycerides: 59 mg/dL (ref <150)

## 2019-06-30 LAB — HEMOGLOBIN A1C
Hgb A1c MFr Bld: 8 % of total Hgb — ABNORMAL HIGH (ref <5.7)
Mean Plasma Glucose: 183 (calc)
eAG (mmol/L): 10.1 (calc)

## 2019-07-03 ENCOUNTER — Other Ambulatory Visit (HOSPITAL_COMMUNITY)
Admission: RE | Admit: 2019-07-03 | Discharge: 2019-07-03 | Disposition: A | Discharge status: Home / Self Care | Payer: Medicare Other | Source: Other Acute Inpatient Hospital | Attending: Family Medicine | Admitting: Family Medicine

## 2019-07-03 ENCOUNTER — Ambulatory Visit (INDEPENDENT_AMBULATORY_CARE_PROVIDER_SITE_OTHER): Payer: Medicare Other | Admitting: Family Medicine

## 2019-07-03 ENCOUNTER — Other Ambulatory Visit: Payer: Self-pay

## 2019-07-03 ENCOUNTER — Encounter: Payer: Self-pay | Admitting: Family Medicine

## 2019-07-03 VITALS — BP 134/84 | Temp 97.4°F | Resp 16 | Ht 64.0 in | Wt 146.4 lb

## 2019-07-03 DIAGNOSIS — N3001 Acute cystitis with hematuria: Secondary | ICD-10-CM | POA: Insufficient documentation

## 2019-07-03 DIAGNOSIS — E785 Hyperlipidemia, unspecified: Secondary | ICD-10-CM

## 2019-07-03 DIAGNOSIS — Z0001 Encounter for general adult medical examination with abnormal findings: Secondary | ICD-10-CM | POA: Diagnosis not present

## 2019-07-03 DIAGNOSIS — R42 Dizziness and giddiness: Secondary | ICD-10-CM

## 2019-07-03 DIAGNOSIS — R109 Unspecified abdominal pain: Secondary | ICD-10-CM

## 2019-07-03 DIAGNOSIS — Z794 Long term (current) use of insulin: Secondary | ICD-10-CM

## 2019-07-03 DIAGNOSIS — E1169 Type 2 diabetes mellitus with other specified complication: Secondary | ICD-10-CM | POA: Diagnosis not present

## 2019-07-03 DIAGNOSIS — I10 Essential (primary) hypertension: Secondary | ICD-10-CM

## 2019-07-03 LAB — POCT URINALYSIS DIP (CLINITEK)
Bilirubin, UA: NEGATIVE
Glucose, UA: NEGATIVE mg/dL
Ketones, POC UA: NEGATIVE mg/dL
Nitrite, UA: NEGATIVE
Spec Grav, UA: 1.025 (ref 1.010–1.025)
Urobilinogen, UA: 0.2 E.U./dL
pH, UA: 6 (ref 5.0–8.0)

## 2019-07-03 MED ORDER — BD PEN NEEDLE SHORT U/F 31G X 8 MM MISC: 5 refills | Status: DC

## 2019-07-03 MED ORDER — METFORMIN HCL 1000 MG PO TABS: 1000.0000 mg | ORAL_TABLET | Freq: Two times a day (BID) | ORAL | 1 refills | Status: DC

## 2019-07-03 MED ORDER — AMLODIPINE BESYLATE 5 MG PO TABS: 5.0000 mg | ORAL_TABLET | Freq: Every day | ORAL | 1 refills | Status: DC

## 2019-07-03 MED ORDER — ONETOUCH ULTRA VI STRP: ORAL_STRIP | 5 refills | Status: DC

## 2019-07-03 MED ORDER — LANTUS SOLOSTAR 100 UNIT/ML ~~LOC~~ SOPN: PEN_INJECTOR | SUBCUTANEOUS | 5 refills | Status: DC

## 2019-07-03 MED ORDER — ATORVASTATIN CALCIUM 10 MG PO TABS: ORAL_TABLET | ORAL | 1 refills | Status: DC

## 2019-07-03 NOTE — Assessment & Plan Note (Signed)
[name] is encouraged to maintain a well balanced diet that is low in salt. Controlled, continue current medication regimen.  Additionally, she is also reminded that exercise is beneficial for heart health and control of  Blood pressure. 30-60 minutes daily is recommended-walking was suggested.

## 2019-07-03 NOTE — Assessment & Plan Note (Signed)
Encouraged low fat diet-heart healthy.

## 2019-07-03 NOTE — Assessment & Plan Note (Signed)
Reports that she never took the meclizine.  Advised for her to use the meclizine if she has at home.  If she continues to have the issue we can do physical therapy to see if they can help maneuver things back or ENT referral.

## 2019-07-03 NOTE — Assessment & Plan Note (Signed)
Discussed monthly self breast exams and yearly mammograms; at least 30 minutes of aerobic activity at least 5 days/week and weight-bearing exercise 2x/week; proper sunscreen use reviewed; healthy diet, including goals of calcium and vitamin D intake and alcohol recommendations (less than or equal to 1 drink/day) reviewed; regular seatbelt use; changing batteries in smoke detectors.  Immunization recommendations discussed.  Colonoscopy recommendations reviewed.  

## 2019-07-03 NOTE — Assessment & Plan Note (Signed)
[name] is encouraged to check blood sugar daily as directed. Continue current medications. Is on statin as well. Educated on importance of maintain a well balanced diabetic friendly diet. Foot exam performed today.  She is reminded the importance of maintaining  good blood sugars,  taking medications as directed, daily foot care, annual eye exams. Additionally educated about keeping good control over blood pressure and cholesterol as well.

## 2019-07-03 NOTE — Patient Instructions (Addendum)
I appreciate the opportunity to provide you with care for your health and wellness. Today we discussed: overall health   Follow up: 3.5-4 months   Labs fasting 1 week before next appt No referrals today.  We will let you know what is decided about your leg.  Continue to work on diet and lifestyle changes for diabetes and cholesterol.  Please continue to practice social distancing to keep you, your family, and our community safe.  If you must go out, please wear a mask and practice good handwashing.  It was a pleasure to see you and I look forward to continuing to work together on your health and well-being. Please do not hesitate to call the office if you need care or have questions about your care.  Have a wonderful day and week. With Gratitude, Cherly Beach, DNP, AGNP-BC

## 2019-07-03 NOTE — Assessment & Plan Note (Signed)
Trace amounts of white blood cells and blood send out for culture pending findings we will treat as appropriate.  Also following up with urologist.

## 2019-07-03 NOTE — Progress Notes (Addendum)
Health Maintenance reviewed -   Immunization History  Administered Date(s) Administered  . Influenza Split 04/24/2012  . Influenza,inj,Quad PF,6+ Mos 01/25/2014, 03/14/2015, 05/24/2016, 12/27/2016, 11/15/2017, 12/12/2018  . Pneumococcal Conjugate-13 05/24/2016  . Pneumococcal Polysaccharide-23 12/26/2017  . Tdap 04/24/2012  . Zoster 09/13/2013   Last Pap smear: n/a Last mammogram: 01/2019 Last colonoscopy: Due 2023 Last DEXA: 2014 Dentist: does not see, brushes daily  Ophtho: glasses today-has not seen in a year Exercise: no   Other doctors caring for patient include:  Patient Care Team: Fayrene Helper, MD as PCP - General Williams Che, MD (Inactive) (Ophthalmology)  End of Life Discussion:  Patient does not have a living will and medical power of attorney    Code Status: Prior   Subjective:   HPI  [name] is a 69 y.o. female who presents for annual wellness visit and follow-up on chronic medical conditions.  She has the following concerns: back pain, wondering if kidney stone, will be going to urologist this week. has been aching some in her back for every so often.    Does get constipated at times, more in the last few weeks. No blood seen. Increased water and veggie intake. But still not as easy to go.   Dizziness is still and issue, reports a medication, but nothing on med list-history of meclizine, reports not taking it. Didn't think it was the issue and didn't need the medication.   Leg knot-Right thigh-pain at times. Has tender knot in the back of her right thigh pretty good size and a smaller one in the left thigh. Not hurting today but are somewhat tender always.  Denies having any injury or trauma to the site.  No change in discoloration of either extremity no changes in pulse or sensation some weakness.    Review Of Systems  Review of Systems  Constitutional: Negative.   HENT: Negative.   Eyes: Negative.   Respiratory:  Negative.   Cardiovascular: Negative.   Gastrointestinal: Positive for constipation.  Endocrine: Negative.   Genitourinary: Negative.   Musculoskeletal: Negative.   Skin: Negative.   Allergic/Immunologic: Negative.   Neurological: Positive for dizziness.  Hematological: Negative.   Psychiatric/Behavioral: Negative.   All other systems reviewed and are negative.   Objective:   PHYSICAL EXAM:  BP 134/84   Temp (!) 97.4 F (36.3 C) (Temporal)   Resp 16   Ht 5\' 4"  (1.626 m)   Wt 146 lb 6.4 oz (66.4 kg)   SpO2 96%   BMI 25.13 kg/m    Physical Exam Vitals and nursing note reviewed.  Constitutional:      General: She is awake.     Appearance: Normal appearance. She is well-developed, well-groomed and overweight.  HENT:     Head: Normocephalic and atraumatic.     Right Ear: Hearing, ear canal and external ear normal.     Left Ear: Hearing, tympanic membrane, ear canal and external ear normal.     Ears:     Comments: Bilateral cerumen    Nose: Nose normal.     Mouth/Throat:     Mouth: Mucous membranes are moist.     Pharynx: Oropharynx is clear.  Eyes:     General: Lids are normal.     Extraocular Movements: Extraocular movements intact.     Conjunctiva/sclera: Conjunctivae normal.     Pupils: Pupils are equal, round, and reactive to light.     Comments: glasses  Neck:     Thyroid:  No thyroid mass, thyromegaly or thyroid tenderness.  Cardiovascular:     Rate and Rhythm: Normal rate and regular rhythm.     Pulses: Normal pulses.          Radial pulses are 2+ on the right side and 2+ on the left side.       Dorsalis pedis pulses are 2+ on the right side and 2+ on the left side.     Heart sounds: Normal heart sounds.  Pulmonary:     Effort: Pulmonary effort is normal.     Breath sounds: Normal breath sounds and air entry.  Abdominal:     General: Abdomen is flat. Bowel sounds are normal.     Palpations: Abdomen is soft.     Tenderness: There is no right CVA  tenderness or left CVA tenderness.  Musculoskeletal:        General: Normal range of motion.     Cervical back: Normal range of motion and neck supple.     Right lower leg: No edema.     Left lower leg: No edema.     Comments: Noted "hard formed area" to the posterior lateral Right thigh. Tenderness to touch.  No redness or drainage or signs of infection.  Lymphadenopathy:     Cervical: No cervical adenopathy.  Skin:    General: Skin is warm and dry.     Capillary Refill: Capillary refill takes less than 2 seconds.  Neurological:     General: No focal deficit present.     Mental Status: She is alert and oriented to person, place, and time. Mental status is at baseline.     Cranial Nerves: Cranial nerves are intact.     Sensory: Sensation is intact.     Motor: Motor function is intact.     Coordination: Coordination is intact.     Gait: Gait is intact.     Deep Tendon Reflexes: Reflexes are normal and symmetric.  Psychiatric:        Attention and Perception: Attention and perception normal.        Mood and Affect: Mood and affect normal.        Speech: Speech normal.        Behavior: Behavior normal. Behavior is cooperative.        Thought Content: Thought content normal.        Cognition and Memory: Cognition and memory normal.        Judgment: Judgment normal.    Diabetic Foot Form - Detailed   Diabetic Foot Exam - detailed Diabetic Foot exam was performed with the following findings: Yes 07/03/2019  9:02 AM  Visual Foot Exam completed.: Yes  Can the patient see the bottom of their feet?: Yes Are the shoes appropriate in style and fit?: Yes Is there swelling or and abnormal foot shape?: No Is there a claw toe deformity?: No Is there elevated skin temparature?: No Is there foot or ankle muscle weakness?: No Normal Range of Motion: Yes Pulse Foot Exam completed.: Yes  Right posterior Tibialias: Present Left posterior Tibialias: Present  Right Dorsalis Pedis: Present Left  Dorsalis Pedis: Present  Sensory Foot Exam Completed.: Yes Semmes-Weinstein Monofilament Test R Site 1-Great Toe: Pos L Site 1-Great Toe: Pos        Depression Screening  Depression screen [facility] 2/9 06/05/2019 12/28/2018 07/13/2018 02/22/2018 12/26/2017  Decreased Interest 0 0 0 1 0  Down, Depressed, Hopeless 0 0 0 0 0  PHQ - 2 Score 0 0  0 1 0  Altered sleeping - - - 0 -  Tired, decreased energy - - - 0 -  Change in appetite - - - 0 -  Feeling bad or failure about yourself  - - - 0 -  Trouble concentrating - - - 1 -  Moving slowly or fidgety/restless - - - 0 -  Suicidal thoughts - - - 0 -  PHQ-9 Score - - - 2 -  Difficult doing work/chores - - - Not difficult at all -  Some recent data might be hidden      Assessment & Plan:   1. Type 2 diabetes mellitus with other specified complication, with long-term current use of insulin ([place])   2. Bilateral flank pain   3. Annual visit for general adult medical examination with abnormal findings   4. Essential hypertension   5. Hyperlipidemia LDL goal <100   6. Vertigo   7. Acute cystitis with hematuria     Tests ordered Orders Placed This Encounter  Procedures  . Urine Culture  . COMPLETE METABOLIC PANEL WITH GFR  . Hemoglobin A1c  . Lipid panel  . POCT URINALYSIS DIP (CLINITEK)     Plan: Please see assessment and plan per problem list above.   Meds ordered this encounter  Medications  . metFORMIN (GLUCOPHAGE) 1000 MG tablet    Sig: Take 1 tablet (1,000 mg total) by mouth 2 (two) times daily with a meal.    Dispense:  180 tablet    Refill:  1  . insulin glargine (LANTUS SOLOSTAR) 100 UNIT/ML Solostar Pen    Sig: INJECT 22 UNITS INTO THE SKIN DAILY AT 10 PM.    Dispense:  15 mL    Refill:  5  . amLODipine (NORVASC) 5 MG tablet    Sig: Take 1 tablet (5 mg total) by mouth daily.    Dispense:  90 tablet    Refill:  1  . atorvastatin (LIPITOR) 10 MG tablet    Sig: TAKE HALF A TABLET BY MOUTH DAILY    Dispense:  45  tablet    Refill:  1  . glucose blood (ONETOUCH ULTRA) test strip    Sig: USE TO TEST BLOOD SUGAR 3 TIMES A DAY AS DIRECTED DX E11.65    Dispense:  100 strip    Refill:  5  . Insulin Pen Needle (B-D ULTRAFINE III SHORT PEN) 31G X 8 MM MISC    Sig: FOR USE WITH INSULIN ONCE DAILY DX E11.9    Dispense:  100 each    Refill:  5    I have personally reviewed: The patient's medical and social history Their use of alcohol, tobacco or illicit drugs Their current medications and supplements The patient's functional ability including ADLs,fall risks, home safety risks, cognitive, and hearing and visual impairment Diet and physical activities Evidence for depression or mood disorders  The patient's weight, height, BMI, and visual acuity have been recorded in the chart.  I have made referrals, counseling, and provided education to the patient based on review of the above and I have provided the patient with a written personalized care plan for preventive services.     Perlie Mayo, NP   07/03/2019

## 2019-07-03 NOTE — Assessment & Plan Note (Signed)
Flank pain not associated with CVA tenderness.  Follow-up with urology and urine culture sent.

## 2019-07-04 ENCOUNTER — Telehealth: Payer: Self-pay | Admitting: *Deleted

## 2019-07-04 NOTE — Telephone Encounter (Signed)
Pt stated she showed Jarrett Soho her legs and that Jarrett Soho stated that she would need to consult with Dr Moshe Cipro as the pt saw dr Holly Bodily and didn't feel as if she did anything for her. Would like to know the recommendations

## 2019-07-04 NOTE — Telephone Encounter (Signed)
I will discuss this with Dr Moshe Cipro once I am able. And will reach back out to her afterwards.

## 2019-07-04 NOTE — Telephone Encounter (Signed)
Pt notified we would reach out to her once we speak to her

## 2019-07-05 ENCOUNTER — Other Ambulatory Visit: Payer: Self-pay | Admitting: Family Medicine

## 2019-07-05 DIAGNOSIS — N200 Calculus of kidney: Secondary | ICD-10-CM | POA: Diagnosis not present

## 2019-07-05 DIAGNOSIS — N3001 Acute cystitis with hematuria: Secondary | ICD-10-CM | POA: Diagnosis not present

## 2019-07-05 LAB — URINE CULTURE: Culture: 40000 — AB

## 2019-07-05 MED ORDER — NITROFURANTOIN MONOHYD MACRO 100 MG PO CAPS: 100.0000 mg | ORAL_CAPSULE | Freq: Two times a day (BID) | ORAL | 0 refills | Status: AC

## 2019-07-05 NOTE — Progress Notes (Signed)
LVM for patient to call the office

## 2019-07-05 NOTE — Progress Notes (Signed)
Pt advised of recommendations with verbal undertanding

## 2019-08-30 ENCOUNTER — Other Ambulatory Visit: Payer: Self-pay

## 2019-08-30 ENCOUNTER — Other Ambulatory Visit (HOSPITAL_COMMUNITY)
Admission: RE | Admit: 2019-08-30 | Discharge: 2019-08-30 | Disposition: A | Discharge status: Home / Self Care | Payer: Medicare Other | Source: Other Acute Inpatient Hospital | Attending: Family Medicine | Admitting: Family Medicine

## 2019-08-30 ENCOUNTER — Encounter: Payer: Self-pay | Admitting: Family Medicine

## 2019-08-30 ENCOUNTER — Ambulatory Visit (INDEPENDENT_AMBULATORY_CARE_PROVIDER_SITE_OTHER): Payer: Medicare Other | Admitting: Family Medicine

## 2019-08-30 VITALS — BP 135/81 | HR 93 | Resp 16 | Ht 64.0 in | Wt 143.0 lb

## 2019-08-30 DIAGNOSIS — Z794 Long term (current) use of insulin: Secondary | ICD-10-CM

## 2019-08-30 DIAGNOSIS — N3001 Acute cystitis with hematuria: Secondary | ICD-10-CM | POA: Insufficient documentation

## 2019-08-30 DIAGNOSIS — R2241 Localized swelling, mass and lump, right lower limb: Secondary | ICD-10-CM

## 2019-08-30 DIAGNOSIS — E1169 Type 2 diabetes mellitus with other specified complication: Secondary | ICD-10-CM | POA: Diagnosis not present

## 2019-08-30 DIAGNOSIS — E785 Hyperlipidemia, unspecified: Secondary | ICD-10-CM | POA: Diagnosis not present

## 2019-08-30 DIAGNOSIS — I10 Essential (primary) hypertension: Secondary | ICD-10-CM | POA: Diagnosis not present

## 2019-08-30 LAB — POCT URINALYSIS DIP (CLINITEK)
Bilirubin, UA: NEGATIVE
Glucose, UA: NEGATIVE mg/dL
Nitrite, UA: NEGATIVE
POC PROTEIN,UA: 30 — AB
Spec Grav, UA: >=1.03 — AB (ref 1.010–1.025)
Urobilinogen, UA: 0.2 E.U./dL
pH, UA: 5.5 (ref 5.0–8.0)

## 2019-08-30 NOTE — Assessment & Plan Note (Signed)
Controlled, no change in medication DASH diet and commitment to daily physical activity for a minimum of 30 minutes discussed and encouraged, as a part of hypertension management. The importance of attaining a healthy weight is also discussed.  BP/Weight 08/30/2019 07/03/2019 06/05/2019 12/28/2018 12/06/2018 11/14/2018 05/06/4067  Systolic BP 861 483 073 543 014 840 397  Diastolic BP 81 84 78 82 82 78 69  Wt. (Lbs) 143 146.4 147.2 145 145 145 146  BMI 24.55 25.13 26.08 24.89 24.89 24.89 25.06

## 2019-08-30 NOTE — Assessment & Plan Note (Signed)
[name] is reminded of the importance of commitment to daily physical activity for 30 minutes or more, as able and the need to limit carbohydrate intake to 30 to 60 grams per meal to help with blood sugar control.   The need to take medication as prescribed, test blood sugar as directed, and to call between visits if there is a concern that blood sugar is uncontrolled is also discussed.   [name] is reminded of the importance of daily foot exam, annual eye examination, and good blood sugar, blood pressure and cholesterol control.  Diabetic Labs Latest Ref Rng & Units 06/29/2019 11/16/2018 02/20/2018 11/11/2017 07/13/2017  HbA1c <5.7 % of total Hgb 8.0(H) 7.7(H) 7.7(H) 7.6(H) -  Microalbumin mg/dL - 2.3 - - 17.9(H)  Micro/Creat Ratio <30 mcg/mg creat - 37(H) - - 22.9  Chol <200 mg/dL 217(H) 210(H) - 189 -  HDL > OR = 50 mg/dL 86 88 - 71 -  Calc LDL mg/dL (calc) 116(H) 106(H) - 102(H) -  Triglycerides <150 mg/dL 59 71 - 71 -  Creatinine 0.60 - 0.93 mg/dL 0.56(L) 0.58 0.64 0.65 -   BP/Weight 08/30/2019 07/03/2019 06/05/2019 12/28/2018 12/06/2018 11/14/2018 0/62/3762  Systolic BP 831 517 616 073 710 626 948  Diastolic BP 81 84 78 82 82 78 69  Wt. (Lbs) 143 146.4 147.2 145 145 145 146  BMI 24.55 25.13 26.08 24.89 24.89 24.89 25.06   Foot/eye exam completion dates Latest Ref Rng & Units 07/03/2019 12/25/2018  Eye Exam No Retinopathy - No Retinopathy  Foot exam Order - - -  Foot Form Completion - Done -    Reports poor control x 2 months since losing he sister, will resume more intense conmtrol

## 2019-08-30 NOTE — Progress Notes (Signed)
[name]     MRN: [id number]      DOB: 12/09/1950   HPI [name] is here for follow up and re-evaluation of chronic medical conditions, medication management and review of any available recent lab and radiology data.  Preventive health is updated, specifically  Cancer screening and Immunization.   Questions or concerns regarding consultations or procedures which the PT has had in the interim are  addressed. The PT denies any adverse reactions to current medications since the last visit.  Has been on 3 courses of antibiotic since  April for urinary symptoms. Now c/o urgency, has bladder pressure and pain with urination now, no fever, chills and does have intermittent bilateral flank pain, does have kidney stones Tests bedtime blood sugar and often does not take pm meds as scared that sugar will bottom out Denies polyuria, polydipsia, blurred vision , or hypoglycemic episodes. C/o intermittent  Sore throat x 1 month 1 month h/o intermittent sore throat, no fever or chills C/o posterior thigh lump x months, requests [place]  ROS Denies recent fever or chills. Denies sinus pressure, nasal congestion, ear pain or sore throat. Denies chest congestion, productive cough or wheezing. Denies chest pains, palpitations and leg swelling Denies abdominal pain, nausea, vomiting,diarrhea or constipation.    Denies joint pain, swelling and limitation in mobility. Denies headaches, seizures, numbness, or tingling. Mild depression , grieving recent loss of her sister.   PE  BP 135/81   Pulse 93   Resp 16   Ht 5\' 4"  (1.626 m)   Wt 143 lb (64.9 kg)   SpO2 97%   BMI 24.55 kg/m   Patient alert and oriented and in no cardiopulmonary distress.  HEENT: No facial asymmetry, EOMI,     Neck supple .Oropharynx, no erythema, no tonsillar enlargement, no exudate  Chest: Clear to auscultation bilaterally.  CVS: S1, S2 no murmurs, no S3.Regular rate.  ABD: Soft non tender.   Ext: No edema  MS:  Adequate ROM spine, shoulders, hips and knees.  Skin: Intact, no ulcerations or rash noted.No palpable mass on posterior thigh in area of patient's concern  Psych: Good eye contact, normal affect. Memory intact not anxious or depressed appearing.  CNS: CN 2-12 intact, power,  normal throughout.no focal deficits noted.   Assessment & Plan  Mass of right thigh righjt posterior thigh mass per pt x weeks, requests [place], non palapble on my exam, will order test  Essential hypertension Controlled, no change in medication DASH diet and commitment to daily physical activity for a minimum of 30 minutes discussed and encouraged, as a part of hypertension management. The importance of attaining a healthy weight is also discussed.  BP/Weight 08/30/2019 07/03/2019 06/05/2019 12/28/2018 12/06/2018 11/14/2018 04/07/8655  Systolic BP 846 962 952 841 324 401 027  Diastolic BP 81 84 78 82 82 78 69  Wt. (Lbs) 143 146.4 147.2 145 145 145 146  BMI 24.55 25.13 26.08 24.89 24.89 24.89 25.06       Acute cystitis with hematuria Symptomatic with abnormal UA despite 3 courses of antibiotic, send for c/s, still awaiting Urology procedure  Type 2 diabetes mellitus with other specified complication [facility]) [name] is reminded of the importance of commitment to daily physical activity for 30 minutes or more, as able and the need to limit carbohydrate intake to 30 to 60 grams per meal to help with blood sugar control.   The need to take medication as prescribed, test blood sugar as directed, and to  call between visits if there is a concern that blood sugar is uncontrolled is also discussed.   [name] is reminded of the importance of daily foot exam, annual eye examination, and good blood sugar, blood pressure and cholesterol control.  Diabetic Labs Latest Ref Rng & Units 06/29/2019 11/16/2018 02/20/2018 11/11/2017 07/13/2017  HbA1c <5.7 % of total Hgb 8.0(H) 7.7(H) 7.7(H) 7.6(H) -  Microalbumin mg/dL - 2.3 - - 17.9(H)   Micro/Creat Ratio <30 mcg/mg creat - 37(H) - - 22.9  Chol <200 mg/dL 217(H) 210(H) - 189 -  HDL > OR = 50 mg/dL 86 88 - 71 -  Calc LDL mg/dL (calc) 116(H) 106(H) - 102(H) -  Triglycerides <150 mg/dL 59 71 - 71 -  Creatinine 0.60 - 0.93 mg/dL 0.56(L) 0.58 0.64 0.65 -   BP/Weight 08/30/2019 07/03/2019 06/05/2019 12/28/2018 12/06/2018 11/14/2018 09/26/5748  Systolic BP 518 335 825 189 842 103 128  Diastolic BP 81 84 78 82 82 78 69  Wt. (Lbs) 143 146.4 147.2 145 145 145 146  BMI 24.55 25.13 26.08 24.89 24.89 24.89 25.06   Foot/eye exam completion dates Latest Ref Rng & Units 07/03/2019 12/25/2018  Eye Exam No Retinopathy - No Retinopathy  Foot exam Order - - -  Foot Form Completion - Done -    Reports poor control x 2 months since losing he sister, will resume more intense conmtrol    Hyperlipidemia LDL goal <100 Hyperlipidemia:Low fat diet discussed and encouraged.   Lipid Panel  Lab Results  Component Value Date   CHOL 217 (H) 06/29/2019   HDL 86 06/29/2019   LDLCALC 116 (H) 06/29/2019   TRIG 59 06/29/2019   CHOLHDL 2.5 06/29/2019   Needs to reduce fat in diet Updated lab needed at/ before next visit.

## 2019-08-30 NOTE — Assessment & Plan Note (Signed)
Hyperlipidemia:Low fat diet discussed and encouraged.   Lipid Panel  Lab Results  Component Value Date   CHOL 217 (H) 06/29/2019   HDL 86 06/29/2019   LDLCALC 116 (H) 06/29/2019   TRIG 59 06/29/2019   CHOLHDL 2.5 06/29/2019   Needs to reduce fat in diet Updated lab needed at/ before next visit.

## 2019-08-30 NOTE — Assessment & Plan Note (Signed)
Symptomatic with abnormal UA despite 3 courses of antibiotic, send for c/s, still awaiting Urology procedure

## 2019-08-30 NOTE — Patient Instructions (Addendum)
Annual physical exam with MD first or 2nd  week in August, needs diabetes follow up at visit also   Center in office today , will send for testing if needed  [place] of right posterior thigh over area of concern to be scheduled  Your throat exam is normal  Reduce glipizide 10 mg to one daily, continue metformin 1000 mg take TWO daily Please test once daily either fasting or bedtime , write them down and send via  'My chart" next week Thursday so I can know how to modify your medications Goal for fasting blood sugar ranges from 90 to 130 and at  bedtime should be between 130 to 170.  Condolence on your recent loss  It is important that you exercise regularly at least 30 minutes 5 times a week. If you develop chest pain, have severe difficulty breathing, or feel very tired, stop exercising immediately and seek medical attention  Think about what you will eat, plan ahead. Choose " clean, green, fresh or frozen" over canned, processed or packaged foods which are more sugary, salty and fatty. 70 to 75% of food eaten should be vegetables and fruit. Three meals at set times with snacks allowed between meals, but they must be fruit or vegetables. Aim to eat over a 12 hour period , example 7 am to 7 pm, and STOP after  your last meal of the day. Drink water,generally about 64 ounces per day, no other drink is as healthy. Fruit juice is best enjoyed in a healthy way, by EATING the fruit. Thanks for choosing [facility], we consider it a privelige to serve you.

## 2019-08-30 NOTE — Assessment & Plan Note (Signed)
righjt posterior thigh mass per pt x weeks, requests [place], non palapble on my exam, will order test

## 2019-08-31 ENCOUNTER — Telehealth: Payer: Self-pay

## 2019-08-31 NOTE — Telephone Encounter (Signed)
Pt is confused. I advised her that only the Dr can call her something in. She thought [name] could.  I advised pt that Dr Moshe Cipro is waiting for the Culture to come back, once back, one of the clinic staff will call her with the direction from Dr Moshe Cipro

## 2019-08-31 NOTE — Telephone Encounter (Signed)
Pt is calling and wants to talk to [facility] about her appointment on 08-30-19

## 2019-08-31 NOTE — Telephone Encounter (Signed)
LVM for pt to call the office.

## 2019-08-31 NOTE — Telephone Encounter (Signed)
noted 

## 2019-09-02 ENCOUNTER — Other Ambulatory Visit: Payer: Self-pay | Admitting: Family Medicine

## 2019-09-02 LAB — URINE CULTURE: Culture: 50000 — AB

## 2019-09-02 MED ORDER — AMPICILLIN 500 MG PO CAPS: 500.0000 mg | ORAL_CAPSULE | Freq: Three times a day (TID) | ORAL | Status: AC

## 2019-09-06 ENCOUNTER — Inpatient Hospital Stay (HOSPITAL_COMMUNITY): Admission: RE | Admit: 2019-09-06 | Payer: Medicare Other | Source: Ambulatory Visit

## 2019-09-07 ENCOUNTER — Other Ambulatory Visit: Payer: Self-pay

## 2019-09-07 ENCOUNTER — Ambulatory Visit (HOSPITAL_COMMUNITY)
Admission: RE | Admit: 2019-09-07 | Discharge: 2019-09-07 | Disposition: A | Discharge status: Home / Self Care | Payer: Medicare Other | Source: Ambulatory Visit | Attending: Family Medicine | Admitting: Family Medicine

## 2019-09-07 DIAGNOSIS — R2241 Localized swelling, mass and lump, right lower limb: Secondary | ICD-10-CM | POA: Diagnosis not present

## 2019-09-11 ENCOUNTER — Telehealth: Payer: Self-pay | Admitting: Family Medicine

## 2019-09-11 NOTE — Telephone Encounter (Signed)
Patient missed a call from someone to go over lab results and would like someone to call her back to go over them with her and any new medications she will be prescribed at [phone]

## 2019-09-11 NOTE — Telephone Encounter (Signed)
Returned patient's call. No answer. Left vm.  

## 2019-09-12 ENCOUNTER — Telehealth: Payer: Self-pay | Admitting: Family Medicine

## 2019-09-12 NOTE — Telephone Encounter (Signed)
Patient missed a call from someone to go over lab results and would like someone to call her back to go over them with her and any new medications she will be prescribed at [phone]

## 2019-09-12 NOTE — Telephone Encounter (Signed)
Pt missed call about lab results and is requesting a return phone call [phone]

## 2019-09-12 NOTE — Telephone Encounter (Signed)
Left message with results

## 2019-10-08 ENCOUNTER — Ambulatory Visit: Payer: Medicare Other

## 2019-10-08 ENCOUNTER — Encounter: Payer: Self-pay | Admitting: Family Medicine

## 2019-10-08 ENCOUNTER — Other Ambulatory Visit: Payer: Self-pay

## 2019-10-08 LAB — HM DIABETES EYE EXAM

## 2019-10-12 ENCOUNTER — Telehealth: Payer: Self-pay | Admitting: *Deleted

## 2019-10-12 NOTE — Telephone Encounter (Signed)
Pt called stating that she felt down had checked her blood sugar was not low felt as if she was nervous and jittery. Advised her to go to urgent care or ER to be checked out. She said she may or may not go. Advised if she was feeling like this it was better not to wait but to go be evaluated. Pt stated she may or may not. FYI

## 2019-10-12 NOTE — Telephone Encounter (Signed)
Noted , pls sched in office appt with me next week and glycoHb at that visit now due, I agree with your recommendation

## 2019-10-15 ENCOUNTER — Other Ambulatory Visit: Payer: Self-pay

## 2019-10-15 ENCOUNTER — Ambulatory Visit (INDEPENDENT_AMBULATORY_CARE_PROVIDER_SITE_OTHER): Payer: Medicare Other | Admitting: Urology

## 2019-10-15 ENCOUNTER — Encounter: Payer: Self-pay | Admitting: Urology

## 2019-10-15 VITALS — BP 128/79 | HR 102 | Temp 97.9°F | Ht 64.0 in | Wt 143.0 lb

## 2019-10-15 DIAGNOSIS — R3129 Other microscopic hematuria: Secondary | ICD-10-CM | POA: Diagnosis not present

## 2019-10-15 DIAGNOSIS — N200 Calculus of kidney: Secondary | ICD-10-CM

## 2019-10-15 LAB — MICROSCOPIC EXAMINATION
Renal Epithel, UA: NONE SEEN /hpf
WBC, UA: >30 /hpf — AB (ref 0–5)

## 2019-10-15 LAB — URINALYSIS, ROUTINE W REFLEX MICROSCOPIC
Bilirubin, UA: NEGATIVE
Nitrite, UA: NEGATIVE
Specific Gravity, UA: 1.025 (ref 1.005–1.030)
Urobilinogen, Ur: 0.2 mg/dL (ref 0.2–1.0)
pH, UA: 5 (ref 5.0–7.5)

## 2019-10-15 NOTE — Progress Notes (Signed)

## 2019-10-15 NOTE — Telephone Encounter (Signed)
Pt scheduled for this Thursday 10-18-19 to follow up with dr Moshe Cipro

## 2019-10-15 NOTE — Progress Notes (Signed)
10/15/2019 11:13 AM   [name] Oct 28, 1950 [id number]  Referring provider: Fayrene Helper, MD [address] [place],  [place] [address]  nephrolithiasis  HPI: [name] is a 69yo here for followup for microhematuria and nephrolithiasis. She saw [facility] in 06/2019 and was diagnosed with bilateral renal calculi up to 25mm. She is having intermittent bilateral flank pain. No LUTS. She underwent ESWL in 2020. No fevers. No UTIs. She was treated with macrobid in 06/2019 but the culture was negatives   PMH: Past Medical History:  Diagnosis Date  . BACK PAIN, THORACIC REGION, RIGHT 01/27/2010   Qualifier: Diagnosis of  By: Moshe Cipro MD, Joycelyn Schmid    . Callus of foot 07/15/2017  . Essential hypertension   . Headache(784.0)   . Hematuria 02/24/2016  . History of kidney stones   . Hyperlipidemia   . Irregular heart beats   . Microscopic hematuria 02/24/2016  . Neck pain on right side 01/23/2013  . Piles (hemorrhoids) 06/22/2011  . Renal calculus, right 05/31/2016  . Shoulder pain, right 01/25/2014  . SKIN TAG 06/13/2008   Qualifier: Diagnosis of  By: Moshe Cipro MD, Joycelyn Schmid    . TIA (transient ischemic attack) 06/19/2016  . Type 2 diabetes mellitus ([place])   . Vaginitis     Surgical History: Past Surgical History:  Procedure Laterality Date  . COLONOSCOPY N/A 10/26/2018   Procedure: COLONOSCOPY;  Surgeon: Rogene Houston, MD;  Location: [facility];  Service: Endoscopy;  Laterality: N/A;  830-10:30am  . cyst removed Right 1998   cyst- removed from right wrist  . EXTRACORPOREAL SHOCK WAVE LITHOTRIPSY Right 05/29/2018   Procedure: EXTRACORPOREAL SHOCK WAVE LITHOTRIPSY (ESWL);  Surgeon: Kathie Rhodes, MD;  Location: [facility];  Service: Urology;  Laterality: Right;  . lithotrpsy     over 20 years  . NEPHROLITHOTOMY Right 05/31/2016   Procedure: NEPHROLITHOTOMY PERCUTANEOUS WITH SURGEON ACCESS;  Surgeon: Cleon Gustin, MD;  Location: [facility];  Service: Urology;  Laterality:  Right;  . POLYPECTOMY  10/26/2018   Procedure: POLYPECTOMY;  Surgeon: Rogene Houston, MD;  Location: [facility];  Service: Endoscopy;;  colon  . TOTAL ABDOMINAL HYSTERECTOMY W/ BILATERAL SALPINGOOPHORECTOMY  1998  . TUBAL LIGATION      Home Medications:  Allergies as of 10/15/2019      Reactions   Onglyza [saxagliptin Hydrochloride] Swelling   Tongue swell   Pravastatin Other (See Comments)   Muscle aches   Ace Inhibitors Cough      Medication List       Accurate as of October 15, 2019 11:13 AM. If you have any questions, ask your nurse or doctor.        acetaminophen 500 MG tablet Commonly known as: TYLENOL Take 1,000 mg by mouth every 6 (six) hours as needed (for headaches/pain.).   amLODipine 5 MG tablet Commonly known as: NORVASC Take 1 tablet (5 mg total) by mouth daily.   aspirin EC 81 MG tablet Take 1 tablet (81 mg total) by mouth daily.   atorvastatin 10 MG tablet Commonly known as: LIPITOR TAKE HALF A TABLET BY MOUTH DAILY   B-D ULTRAFINE III SHORT PEN 31G X 8 MM Misc Generic drug: Insulin Pen Needle FOR USE WITH INSULIN ONCE DAILY DX E11.9   CALCIUM PO Take 1,000 mg by mouth daily.   clotrimazole-betamethasone cream Commonly known as: LOTRISONE Apply 1 application topically 2 (two) times daily. Apply to twice daily to rash under arms, breasts and in groin for  14 days, then as needed   glipiZIDE 10 MG tablet Commonly known as: GLUCOTROL TAKE 1 TABLET BY MOUTH TWICE A DAY BEFORE MEALS   Lantus SoloStar 100 UNIT/ML Solostar Pen Generic drug: insulin glargine INJECT 22 UNITS INTO THE SKIN DAILY AT 10 PM.   Lubricant Eye Drops 0.4-0.3 % Soln Generic drug: Polyethyl Glycol-Propyl Glycol Place 1 drop into both eyes 3 (three) times daily as needed (dry/irritated eyes.).   metFORMIN 1000 MG tablet Commonly known as: GLUCOPHAGE Take 1 tablet (1,000 mg total) by mouth 2 (two) times daily with a meal.   multivitamin with minerals Tabs tablet Take 1  tablet by mouth daily.   nystatin ointment Commonly known as: MYCOSTATIN Apply 1 application topically 2 (two) times daily. What changed:   when to take this  reasons to take this   OneTouch Delica Lancets 14N Misc Once daily dx e11.9 (give lancects that go with pts meter)   OneTouch Ultra test strip Generic drug: glucose blood USE TO TEST BLOOD SUGAR 3 TIMES A DAY AS DIRECTED DX E11.65   Vitamin D3 50 MCG (2000 UT) Tabs Take 2,000 Units by mouth daily.       Allergies:  Allergies  Allergen Reactions  . Onglyza [Saxagliptin Hydrochloride] Swelling    Tongue swell  . Pravastatin Other (See Comments)    Muscle aches  . Ace Inhibitors Cough    Family History: Family History  Problem Relation Age of Onset  . Alcohol abuse Mother   . Esophageal cancer Father   . Alcohol abuse Father   . Diabetes Sister   . Diabetes Sister   . Hypertension Sister   . Heart attack Sister        in 87's  . Seizures Brother     Social History:  reports that she has never smoked. She has never used smokeless tobacco. She reports that she does not drink alcohol and does not use drugs.  ROS: All other review of systems were reviewed and are negative except what is noted above in HPI  Physical Exam: BP 128/79   Pulse (!) 102   Temp 97.9 F (36.6 C)   Ht 5\' 4"  (1.626 m)   Wt 143 lb (64.9 kg)   BMI 24.55 kg/m   Constitutional:  Alert and oriented, No acute distress. HEENT: [place] AT, moist mucus membranes.  Trachea midline, no masses. Cardiovascular: No clubbing, cyanosis, or edema. Respiratory: Normal respiratory effort, no increased work of breathing. GI: Abdomen is soft, nontender, nondistended, no abdominal masses GU: No CVA tenderness.  Lymph: No cervical or inguinal lymphadenopathy. Skin: No rashes, bruises or suspicious lesions. Neurologic: Grossly intact, no focal deficits, moving all 4 extremities. Psychiatric: Normal mood and affect.  Laboratory Data: Lab Results    Component Value Date   WBC 4.7 11/16/2018   HGB 12.3 11/16/2018   HCT 38.1 11/16/2018   MCV 88.6 11/16/2018   PLT 362 11/16/2018    Lab Results  Component Value Date   CREATININE 0.56 (L) 06/29/2019    No results found for: PSA  No results found for: TESTOSTERONE  Lab Results  Component Value Date   HGBA1C 8.0 (H) 06/29/2019    Urinalysis    Component Value Date/Time   COLORURINE YELLOW 01/12/2017 1700   APPEARANCEUR CLEAR 01/12/2017 1700   LABSPEC 1.017 01/12/2017 1700   PHURINE 5.0 01/12/2017 1700   GLUCOSEU NEGATIVE 01/12/2017 1700   HGBUR SMALL (A) 01/12/2017 1700   BILIRUBINUR negative 08/30/2019 1059   BILIRUBINUR  neg 05/26/2017 1213   KETONESUR trace (5) (A) 08/30/2019 1059   KETONESUR NEGATIVE 01/12/2017 1700   PROTEINUR 30 05/26/2017 1213   PROTEINUR NEGATIVE 01/12/2017 1700   UROBILINOGEN 0.2 08/30/2019 1059   UROBILINOGEN 0.2 01/25/2014 0845   NITRITE Negative 08/30/2019 1059   NITRITE positive 05/26/2017 1213   NITRITE POSITIVE (A) 01/12/2017 1700   LEUKOCYTESUR Small (1+) (A) 08/30/2019 1059    Lab Results  Component Value Date   BACTERIA RARE (A) 01/12/2017    Pertinent Imaging: CT stone study 07/05/2019: Images reviewed and discussed with patient  Results for orders placed during the hospital encounter of 05/29/18  DG Abd 1 View  Narrative CLINICAL DATA:  Renal calculi.  EXAM: ABDOMEN - 1 VIEW  COMPARISON:  Radiograph of May 02, 2018. CT scan of March 28, 2018.  FINDINGS: The bowel gas pattern is normal. Stable large right renal calculus is noted.  IMPRESSION: Stable large right renal calculus is noted. No evidence of bowel obstruction or ileus.   Electronically Signed By: Marijo Conception, M.D. On: 05/29/2018 07:48  No results found for this or any previous visit.  No results found for this or any previous visit.  No results found for this or any previous visit.  Results for orders placed during the hospital  encounter of 04/30/14  US Renal  Narrative CLINICAL DATA:  Cystitis, hematuria, flank pain.  EXAM: RENAL/URINARY TRACT ULTRASOUND COMPLETE  COMPARISON:  CT 03/19/2008  FINDINGS: Right Kidney:  Length: 11.2 cm. 7 mm echogenic focus in the lower pole, likely nonobstructing renal stone. No hydronephrosis. Normal echotexture. No focal renal abnormality.  Left Kidney:  Length: 12 cm. Slight caliectasis without overt hydronephrosis. No visible focal abnormality.  Bladder:  Appears normal for degree of bladder distention.  IMPRESSION: Slight caliectasis on the left without overt hydronephrosis.  Right lower pole nonobstructing 7 mm renal stone.   Electronically Signed By: Rolm Baptise M.D. On: 04/30/2014 17:31  No results found for this or any previous visit.  No results found for this or any previous visit.  No results found for this or any previous visit.   Assessment & Plan:    1. Microscopic hematuria -Likely related to renal calculi - Urinalysis, Routine w reflex microscopic  2. Nephrolithiasis -We discussed the management of kidney stones. These options include observation, ureteroscopy, shockwave lithotripsy (ESWL) and percutaneous nephrolithotomy (PCNL). We discussed which options are relevant to the patient's stone(s). We discussed the natural history of kidney stones as well as the complications of untreated stones and the impact on quality of life without treatment as well as with each of the above listed treatments. We also discussed the efficacy of each treatment in its ability to clear the stone burden. With any of these management options I discussed the signs and symptoms of infection and the need for emergent treatment should these be experienced. For each option we discussed the ability of each procedure to clear the patient of their stone burden.   For observation I described the risks which include but are not limited to silent renal damage,  life-threatening infection, need for emergent surgery, failure to pass stone and pain.   For ureteroscopy I described the risks which include bleeding, infection, damage to contiguous structures, positioning injury, ureteral stricture, ureteral avulsion, ureteral injury, need for prolonged ureteral stent, inability to perform ureteroscopy, need for an interval procedure, inability to clear stone burden, stent discomfort/pain, heart attack, stroke, pulmonary embolus and the inherent risks with general anesthesia.  For shockwave lithotripsy I described the risks which include arrhythmia, kidney contusion, kidney hemorrhage, need for transfusion, pain, inability to adequately break up stone, inability to pass stone fragments, Steinstrasse, infection associated with obstructing stones, need for alternate surgical procedure, need for repeat shockwave lithotripsy, MI, CVA, PE and the inherent risks with anesthesia/conscious sedation.   For PCNL I described the risks including positioning injury, pneumothorax, hydrothorax, need for chest tube, inability to clear stone burden, renal laceration, arterial venous fistula or malformation, need for embolization of kidney, loss of kidney or renal function, need for repeat procedure, need for prolonged nephrostomy tube, ureteral avulsion, MI, CVA, PE and the inherent risks of general anesthesia.   - The patient would like to proceed with observation. She may proceed ESWL     No follow-ups on file.  [name], MD  [facility] [place]

## 2019-10-15 NOTE — Patient Instructions (Signed)

## 2019-10-16 LAB — HM DIABETES EYE EXAM

## 2019-10-18 ENCOUNTER — Ambulatory Visit (INDEPENDENT_AMBULATORY_CARE_PROVIDER_SITE_OTHER): Payer: Medicare Other | Admitting: Family Medicine

## 2019-10-18 ENCOUNTER — Other Ambulatory Visit: Payer: Self-pay

## 2019-10-18 ENCOUNTER — Encounter: Payer: Self-pay | Admitting: Family Medicine

## 2019-10-18 VITALS — BP 138/80 | HR 95 | Resp 16 | Ht 64.0 in | Wt 145.0 lb

## 2019-10-18 DIAGNOSIS — E1169 Type 2 diabetes mellitus with other specified complication: Secondary | ICD-10-CM

## 2019-10-18 DIAGNOSIS — I10 Essential (primary) hypertension: Secondary | ICD-10-CM

## 2019-10-18 DIAGNOSIS — Z794 Long term (current) use of insulin: Secondary | ICD-10-CM | POA: Diagnosis not present

## 2019-10-18 DIAGNOSIS — E785 Hyperlipidemia, unspecified: Secondary | ICD-10-CM

## 2019-10-18 LAB — POCT GLYCOSYLATED HEMOGLOBIN (HGB A1C): Hemoglobin A1C: 7.9 % — AB (ref 4.0–5.6)

## 2019-10-18 NOTE — Progress Notes (Signed)
[name]     MRN: [id number]      DOB: 04-28-1950   HPI [name] is here for follow up and re-evaluation of chronic medical conditions, medication management and review of any available recent lab and radiology data.  Preventive health is updated, specifically  Cancer screening and Immunization.   C/o several episodes of blood sugar being 47, and using glipizide at most 3 times / week at half dose Had jittery feeling this past Friday, blood sugar was fine. Does report increased anxiety with blood sugar instability  ROS Denies recent fever or chills. Denies sinus pressure, nasal congestion, ear pain or sore throat. Denies chest congestion, productive cough or wheezing. Denies chest pains, palpitations and leg swelling Denies abdominal pain, nausea, vomiting,diarrhea or constipation.   Denies dysuria, frequency, hesitancy or incontinence. Denies joint pain, swelling and limitation in mobility. Denies headaches, seizures, numbness, or tingling. Denies depression, anxiety or insomnia. Denies skin break down or rash.   PE  BP 128/79   Pulse 95   Resp 16   Ht 5\' 4"  (1.626 m)   Wt 145 lb 0.6 oz (65.8 kg)   SpO2 95%   BMI 24.90 kg/m   Patient alert and oriented and in no cardiopulmonary distress.  HEENT: No facial asymmetry, EOMI,     Neck supple .  Chest: Clear to auscultation bilaterally.  CVS: S1, S2 no murmurs, no S3.Regular rate.  ABD: Soft non tender.   Ext: No edema  MS: Adequate ROM spine, shoulders, hips and knees.  Skin: Intact, no ulcerations or rash noted.  Psych: Good eye contact, normal affect. Memory intact mildly  anxious not depressed appearing.  CNS: CN 2-12 intact, power,  normal throughout.no focal deficits noted.   Assessment & Plan  Type 2 diabetes mellitus with other specified complication [facility]) [name] is reminded of the importance of commitment to daily physical activity for 30 minutes or more, as able and the need to limit  carbohydrate intake to 30 to 60 grams per meal to help with blood sugar control.   The need to take medication as prescribed, test blood sugar as directed, and to call between visits if there is a concern that blood sugar is uncontrolled is also discussed.   [name] is reminded of the importance of daily foot exam, annual eye examination, and good blood sugar, blood pressure and cholesterol control. Medication needs adjustment as having hypoglycemia and taking medication sporadically as a result Test three times daily and report readings in 1 week Diabetic Labs Latest Ref Rng & Units 10/18/2019 06/29/2019 11/16/2018 02/20/2018 11/11/2017  HbA1c 4.0 - 5.6 % 7.9(A) 8.0(H) 7.7(H) 7.7(H) 7.6(H)  Microalbumin mg/dL - - 2.3 - -  Micro/Creat Ratio <30 mcg/mg creat - - 37(H) - -  Chol <200 mg/dL - 217(H) 210(H) - 189  HDL > OR = 50 mg/dL - 86 88 - 71  Calc LDL mg/dL (calc) - 116(H) 106(H) - 102(H)  Triglycerides <150 mg/dL - 59 71 - 71  Creatinine 0.60 - 0.93 mg/dL - 0.56(L) 0.58 0.64 0.65   BP/Weight 10/18/2019 10/15/2019 08/30/2019 07/03/2019 06/05/2019 12/28/2018 09/04/5282  Systolic BP 132 440 102 725 366 440 347  Diastolic BP 80 79 81 84 78 82 82  Wt. (Lbs) 145.04 143 143 146.4 147.2 145 145  BMI 24.9 24.55 24.55 25.13 26.08 24.89 24.89   Foot/eye exam completion dates Latest Ref Rng & Units 10/16/2019 07/03/2019  Eye Exam No Retinopathy No Retinopathy -  Foot exam  Order - - -  Foot Form Completion - - Done        Essential hypertension Controlled, no change in medication DASH diet and commitment to daily physical activity for a minimum of 30 minutes discussed and encouraged, as a part of hypertension management. The importance of attaining a healthy weight is also discussed.  BP/Weight 10/18/2019 10/15/2019 08/30/2019 07/03/2019 06/05/2019 12/28/2018 2/68/3419  Systolic BP 622 297 989 211 941 740 814  Diastolic BP 80 79 81 84 78 82 82  Wt. (Lbs) 145.04 143 143 146.4 147.2 145 145  BMI 24.9  24.55 24.55 25.13 26.08 24.89 24.89       Hyperlipidemia LDL goal <100 Hyperlipidemia:Low fat diet discussed and encouraged.   Lipid Panel  Lab Results  Component Value Date   CHOL 217 (H) 06/29/2019   HDL 86 06/29/2019   LDLCALC 116 (H) 06/29/2019   TRIG 59 06/29/2019   CHOLHDL 2.5 06/29/2019   Not at goal, needs to reduce fried and fatty foods

## 2019-10-18 NOTE — Patient Instructions (Signed)
F/u in 6 weeks with blood sugar log, and to re evaluate blood pressure.Call if you need me sooner with concerns re blood sugar  STOP glipizide 10 mg tablet  Take 22 units lantus every day and metformin 1000 mg once daily for 1 week, then increase to twice daily as directed  Call in 1 week with blood sugar values please  Test and record blood sugar tHREE times daily, before breakfast, 2 hours after lunch and at bedtime  tARGET RANGE FOR BLOOD SUGAR IS 80 TO 130 BEFORE BREAKFAST AND 130 TO 170 AT BEDTIME  JITTERY FEELING LIKELY BECAUSE YOU HAVE HAD BLOOD SUGAR LOWS AND ARE ANXIOUS  nO NEED FOR EXTRA IRON, YOU ARE NOT ANEMIC, ONE MULTIVITAMIN ONCE DAILY IS SUFFICIENT  It is important that you exercise regularly at least 30 minutes 5 times a week. If you develop chest pain, have severe difficulty breathing, or feel very tired, stop exercising immediately and seek medical attention    It is important that you exercise regularly at least 30 minutes 5 times a week. If you develop chest pain, have severe difficulty breathing, or feel very tired, stop exercising immediately and seek medical attention  Think about what you will eat, plan ahead. Choose " clean, green, fresh or frozen" over canned, processed or packaged foods which are more sugary, salty and fatty. 70 to 75% of food eaten should be vegetables and fruit. Three meals at set times with snacks allowed between meals, but they must be fruit or vegetables. Aim to eat over a 12 hour period , example 7 am to 7 pm, and STOP after  your last meal of the day. Drink water,generally about 64 ounces per day, no other drink is as healthy. Fruit juice is best enjoyed in a healthy way, by EATING the fruit. Thanks for choosing [facility], we consider it a privelige to serve you.

## 2019-10-20 ENCOUNTER — Encounter: Payer: Self-pay | Admitting: Family Medicine

## 2019-10-20 NOTE — Assessment & Plan Note (Signed)
Hyperlipidemia:Low fat diet discussed and encouraged.   Lipid Panel  Lab Results  Component Value Date   CHOL 217 (H) 06/29/2019   HDL 86 06/29/2019   LDLCALC 116 (H) 06/29/2019   TRIG 59 06/29/2019   CHOLHDL 2.5 06/29/2019   Not at goal, needs to reduce fried and fatty foods

## 2019-10-20 NOTE — Assessment & Plan Note (Signed)
Controlled, no change in medication DASH diet and commitment to daily physical activity for a minimum of 30 minutes discussed and encouraged, as a part of hypertension management. The importance of attaining a healthy weight is also discussed.  BP/Weight 10/18/2019 10/15/2019 08/30/2019 07/03/2019 06/05/2019 12/28/2018 0/25/4862  Systolic BP 824 175 301 040 459 136 859  Diastolic BP 80 79 81 84 78 82 82  Wt. (Lbs) 145.04 143 143 146.4 147.2 145 145  BMI 24.9 24.55 24.55 25.13 26.08 24.89 24.89

## 2019-10-20 NOTE — Assessment & Plan Note (Signed)
[name] is reminded of the importance of commitment to daily physical activity for 30 minutes or more, as able and the need to limit carbohydrate intake to 30 to 60 grams per meal to help with blood sugar control.   The need to take medication as prescribed, test blood sugar as directed, and to call between visits if there is a concern that blood sugar is uncontrolled is also discussed.   [name] is reminded of the importance of daily foot exam, annual eye examination, and good blood sugar, blood pressure and cholesterol control. Medication needs adjustment as having hypoglycemia and taking medication sporadically as a result Test three times daily and report readings in 1 week Diabetic Labs Latest Ref Rng & Units 10/18/2019 06/29/2019 11/16/2018 02/20/2018 11/11/2017  HbA1c 4.0 - 5.6 % 7.9(A) 8.0(H) 7.7(H) 7.7(H) 7.6(H)  Microalbumin mg/dL - - 2.3 - -  Micro/Creat Ratio <30 mcg/mg creat - - 37(H) - -  Chol <200 mg/dL - 217(H) 210(H) - 189  HDL > OR = 50 mg/dL - 86 88 - 71  Calc LDL mg/dL (calc) - 116(H) 106(H) - 102(H)  Triglycerides <150 mg/dL - 59 71 - 71  Creatinine 0.60 - 0.93 mg/dL - 0.56(L) 0.58 0.64 0.65   BP/Weight 10/18/2019 10/15/2019 08/30/2019 07/03/2019 06/05/2019 12/28/2018 1/60/1093  Systolic BP 235 573 220 254 270 623 762  Diastolic BP 80 79 81 84 78 82 82  Wt. (Lbs) 145.04 143 143 146.4 147.2 145 145  BMI 24.9 24.55 24.55 25.13 26.08 24.89 24.89   Foot/eye exam completion dates Latest Ref Rng & Units 10/16/2019 07/03/2019  Eye Exam No Retinopathy No Retinopathy -  Foot exam Order - - -  Foot Form Completion - - Done

## 2019-10-25 ENCOUNTER — Ambulatory Visit: Payer: Medicare Other | Admitting: Family Medicine

## 2019-12-04 ENCOUNTER — Encounter: Payer: Self-pay | Admitting: Family Medicine

## 2019-12-04 ENCOUNTER — Other Ambulatory Visit (HOSPITAL_COMMUNITY)
Admission: RE | Admit: 2019-12-04 | Discharge: 2019-12-04 | Disposition: A | Discharge status: Home / Self Care | Payer: Medicare Other | Source: Other Acute Inpatient Hospital | Attending: Family Medicine | Admitting: Family Medicine

## 2019-12-04 ENCOUNTER — Other Ambulatory Visit: Payer: Self-pay

## 2019-12-04 ENCOUNTER — Ambulatory Visit (INDEPENDENT_AMBULATORY_CARE_PROVIDER_SITE_OTHER): Payer: Medicare Other | Admitting: Family Medicine

## 2019-12-04 VITALS — BP 150/98 | HR 102 | Resp 16 | Ht 64.0 in | Wt 144.1 lb

## 2019-12-04 DIAGNOSIS — E785 Hyperlipidemia, unspecified: Secondary | ICD-10-CM

## 2019-12-04 DIAGNOSIS — I10 Essential (primary) hypertension: Secondary | ICD-10-CM | POA: Diagnosis not present

## 2019-12-04 DIAGNOSIS — E559 Vitamin D deficiency, unspecified: Secondary | ICD-10-CM

## 2019-12-04 DIAGNOSIS — E1169 Type 2 diabetes mellitus with other specified complication: Secondary | ICD-10-CM | POA: Diagnosis not present

## 2019-12-04 DIAGNOSIS — Z0000 Encounter for general adult medical examination without abnormal findings: Secondary | ICD-10-CM

## 2019-12-04 DIAGNOSIS — L723 Sebaceous cyst: Secondary | ICD-10-CM | POA: Diagnosis not present

## 2019-12-04 DIAGNOSIS — Z794 Long term (current) use of insulin: Secondary | ICD-10-CM

## 2019-12-04 DIAGNOSIS — Z23 Encounter for immunization: Secondary | ICD-10-CM | POA: Diagnosis not present

## 2019-12-04 MED ORDER — SPIRONOLACTONE 25 MG PO TABS: 25.0000 mg | ORAL_TABLET | Freq: Every day | ORAL | 3 refills | Status: DC

## 2019-12-04 NOTE — Patient Instructions (Addendum)
Follow-up in office with MD November 15 or after call if you need me sooner.  Microalbumin from office today.  Flu vaccine in office today.  You are referred to diabetic educator for help with with nutrition appropriate for diabetes .  Specifically help with carbohydrate counting and accounting.  Please commit to physical activity like dancing or walking please for 10 minutes 3 times daily.  Blood pressure is elevated today spironolactone 25 mg 1 daily is added to the amlodipine 5 mg tablet that you already take.  Nurse will discuss with you whether [facility] will pill pack.  If not then you need to consider seriously changing your pharmacy as you said you do have problems keeping up with taking your medication regularly regularly.  You are referred to surgery for evaluation and management of sebaceous cysts.  Fasting lipid CMP and EGFR and hemoglobin A1, cBC and Vit D prior to next visit.Nov 12 or after   Thanks for choosing [facility], we consider it a privelige to serve you.

## 2019-12-04 NOTE — Assessment & Plan Note (Signed)

## 2019-12-05 LAB — MICROALBUMIN, URINE: Microalb, Ur: 304 ug/mL — ABNORMAL HIGH

## 2019-12-11 ENCOUNTER — Encounter: Payer: Self-pay | Admitting: Family Medicine

## 2019-12-11 NOTE — Assessment & Plan Note (Signed)
Uncontrolled add spironolactone and re eval DASH diet and commitment to daily physical activity for a minimum of 30 minutes discussed and encouraged, as a part of hypertension management. The importance of attaining a healthy weight is also discussed.  BP/Weight 12/04/2019 10/18/2019 10/15/2019 08/30/2019 07/03/2019 06/05/2019 97/52/9553  Systolic BP 971 410 677 616 076 066 785  Diastolic BP 98 80 79 81 84 78 82  Wt. (Lbs) 144.12 145.04 143 143 146.4 147.2 145  BMI 24.74 24.9 24.55 24.55 25.13 26.08 24.89

## 2019-12-11 NOTE — Assessment & Plan Note (Signed)
[name] is reminded of the importance of commitment to daily physical activity for 30 minutes or more, as able and the need to limit carbohydrate intake to 30 to 60 grams per meal to help with blood sugar control.   The need to take medication as prescribed, test blood sugar as directed, and to call between visits if there is a concern that blood sugar is uncontrolled is also discussed.   [name] is reminded of the importance of daily foot exam, annual eye examination, and good blood sugar, blood pressure and cholesterol control. Refer to diabetic educator  Diabetic Labs Latest Ref Rng & Units 12/04/2019 10/18/2019 06/29/2019 11/16/2018 02/20/2018  HbA1c 4.0 - 5.6 % - 7.9(A) 8.0(H) 7.7(H) 7.7(H)  Microalbumin Not Estab. ug/mL 304.0(H) - - 2.3 -  Micro/Creat Ratio <30 mcg/mg creat - - - 37(H) -  Chol <200 mg/dL - - 217(H) 210(H) -  HDL > OR = 50 mg/dL - - 86 88 -  Calc LDL mg/dL (calc) - - 116(H) 106(H) -  Triglycerides <150 mg/dL - - 59 71 -  Creatinine 0.60 - 0.93 mg/dL - - 0.56(L) 0.58 0.64   BP/Weight 12/04/2019 10/18/2019 10/15/2019 08/30/2019 07/03/2019 06/05/2019 32/95/1884  Systolic BP 166 063 016 010 932 355 732  Diastolic BP 98 80 79 81 84 78 82  Wt. (Lbs) 144.12 145.04 143 143 146.4 147.2 145  BMI 24.74 24.9 24.55 24.55 25.13 26.08 24.89   Foot/eye exam completion dates Latest Ref Rng & Units 10/16/2019 10/08/2019  Eye Exam No Retinopathy No Retinopathy No Retinopathy  Foot exam Order - - -  Foot Form Completion - - -

## 2019-12-11 NOTE — Progress Notes (Addendum)
[name]     MRN: [id number]      DOB: [birth year]  HPI: Patient is in for annual physical exam. Changed to follow up visit Uncontrolled hTN is addressed. Lower back pain acute , due to arthritis, no abnormal neurologic exam uncontrolled diabetes, referred to educator and counseled during visit C/o cystic lesions on back and anterior chest between breasts , drain intermittently Recent labs, if available are reviewed. Immunization is reviewed , and  updated    PE: BP (!) 150/98   Pulse (!) 102   Resp 16   Ht 5\' 4"  (1.626 m)   Wt 144 lb 1.9 oz (65.4 kg)   SpO2 98%   BMI 24.74 kg/m   Pleasant  female, alert and oriented x 3, in no cardio-pulmonary distress. Afebrile. HEENT No facial trauma or asymetry. Sinuses non tender.  Extra occullar muscles intact.. External ears normal, . Neck: decreased ROM, no adenopathy,JVD or thyromegaly.No bruits.  Chest: Clear to ascultation bilaterally.No crackles or wheezes. Non tender to palpation  Breast: No asymetry,no masses or lumps. No tenderness. No nipple discharge or inversion. No axillary or supraclavicular adenopathy  Cardiovascular system; Heart sounds normal,  S1 and  S2 ,no S3.  No murmur, or thrill. Apical beat not displaced Peripheral pulses normal.  Abdomen: Soft, non tender, no organomegaly or masses. No bruits. Bowel sounds normal. No guarding, tenderness or rebound.   GU: Asymptomatic , not examined  Musculoskeletal exam:decreased though adequate ROM of spine, hips , shoulders and knees. No deformity ,swelling or crepitus noted. No muscle wasting or atrophy.   Neurologic: Cranial nerves 2 to 12 intact. Power, tone ,sensation and reflexes normal throughout. No disturbance in gait. No tremor.  Skin: sebaceous cysts multiple , anterior and posterior chest  largest is over the sternum,max diameter approx 5 cm  Pigmentation normal throughout  Psych; Normal mood and affect. Judgement and  concentration normal   Assessment & Plan:  Annual physical exam Annual exam as documented. Counseling done  re healthy lifestyle involving commitment to 150 minutes exercise per week, heart healthy diet, and attaining healthy weight.The importance of adequate sleep also discussed. Regular seat belt use and home safety, is also discussed. Changes in health habits are decided on by the patient with goals and time frames  set for achieving them. Immunization and cancer screening needs are specifically addressed at this visit.   Sebaceous cyst Sebaceous cyst of sternum, max diameter approx 5 cm, no purulent drainage , deep  Essential hypertension Uncontrolled add spironolactone and re eval DASH diet and commitment to daily physical activity for a minimum of 30 minutes discussed and encouraged, as a part of hypertension management. The importance of attaining a healthy weight is also discussed.  BP/Weight 12/04/2019 10/18/2019 10/15/2019 08/30/2019 07/03/2019 06/05/2019 96/75/9163  Systolic BP 846 659 935 701 779 390 300  Diastolic BP 98 80 79 81 84 78 82  Wt. (Lbs) 144.12 145.04 143 143 146.4 147.2 145  BMI 24.74 24.9 24.55 24.55 25.13 26.08 24.89       Type 2 diabetes mellitus with other specified complication [facility]) Ms. Tusing is reminded of the importance of commitment to daily physical activity for 30 minutes or more, as able and the need to limit carbohydrate intake to 30 to 60 grams per meal to help with blood sugar control.   The need to take medication as prescribed, test blood sugar as directed, and to call between visits if there is a concern that blood sugar is  uncontrolled is also discussed.   Ms. Hora is reminded of the importance of daily foot exam, annual eye examination, and good blood sugar, blood pressure and cholesterol control. Refer to diabetic educator  Diabetic Labs Latest Ref Rng & Units 12/04/2019 10/18/2019 06/29/2019 11/16/2018 02/20/2018  HbA1c 4.0 - 5.6 % -  7.9(A) 8.0(H) 7.7(H) 7.7(H)  Microalbumin Not Estab. ug/mL 304.0(H) - - 2.3 -  Micro/Creat Ratio <30 mcg/mg creat - - - 37(H) -  Chol <200 mg/dL - - 217(H) 210(H) -  HDL > OR = 50 mg/dL - - 86 88 -  Calc LDL mg/dL (calc) - - 116(H) 106(H) -  Triglycerides <150 mg/dL - - 59 71 -  Creatinine 0.60 - 0.93 mg/dL - - 0.56(L) 0.58 0.64   BP/Weight 12/04/2019 10/18/2019 10/15/2019 08/30/2019 07/03/2019 06/05/2019 04/59/9774  Systolic BP 142 395 320 233 435 686 168  Diastolic BP 98 80 79 81 84 78 82  Wt. (Lbs) 144.12 145.04 143 143 146.4 147.2 145  BMI 24.74 24.9 24.55 24.55 25.13 26.08 24.89   Foot/eye exam completion dates Latest Ref Rng & Units 10/16/2019 10/08/2019  Eye Exam No Retinopathy No Retinopathy No Retinopathy  Foot exam Order - - -  Foot Form Completion - - -

## 2019-12-11 NOTE — Assessment & Plan Note (Signed)
Sebaceous cyst of sternum, max diameter approx 5 cm, no purulent drainage , deep

## 2019-12-20 ENCOUNTER — Ambulatory Visit: Payer: Medicare Other | Attending: Internal Medicine

## 2019-12-20 ENCOUNTER — Other Ambulatory Visit: Payer: Medicare Other

## 2019-12-20 DIAGNOSIS — Z23 Encounter for immunization: Secondary | ICD-10-CM

## 2019-12-20 NOTE — Progress Notes (Signed)
   Covid-19 Vaccination Clinic  Name:  [name]    MRN: [id number] DOB: 03-19-1950  12/20/2019  [name] was observed post Covid-19 immunization for 15 minutes without incident. She was provided with Vaccine Information Sheet and instruction to access the V-Safe system.   [name] was instructed to call 911 with any severe reactions post vaccine: Marland Kitchen Difficulty breathing  . Swelling of face and throat  . A fast heartbeat  . A bad rash all over body  . Dizziness and weakness

## 2019-12-24 ENCOUNTER — Telehealth: Payer: Self-pay

## 2019-12-24 ENCOUNTER — Other Ambulatory Visit: Payer: Self-pay | Admitting: Family Medicine

## 2019-12-24 DIAGNOSIS — R808 Other proteinuria: Secondary | ICD-10-CM

## 2019-12-24 NOTE — Telephone Encounter (Signed)
Pls let [name] know this shows that she is spilling more protein in [name] urine than is normal suggesting that she does have some kidney impairment/ damage  This nay be from both long standing hypertension and also [name] diabetes.  Getting them both well controlled will protect [name] kidneys from further damage. Avoid ALL pain medication except tylenol   I will refer [name] Nephrology Dr Marylene Buerger for surveillance/ evaluation also I have entered the referral

## 2019-12-24 NOTE — Telephone Encounter (Signed)
Patient is calling asking for the urine results. The microalbumin was elevated but there is no result note attached. Please advise

## 2019-12-24 NOTE — Telephone Encounter (Signed)
Patient has left several messages today. Called pt to let her know I was waiting on response

## 2019-12-25 NOTE — Telephone Encounter (Signed)
Pt informed and verbalizes understanding. Awaiting appt from Nephrologist.

## 2019-12-27 ENCOUNTER — Other Ambulatory Visit: Payer: Self-pay

## 2019-12-27 ENCOUNTER — Ambulatory Visit: Payer: Medicare Other

## 2019-12-27 ENCOUNTER — Ambulatory Visit (INDEPENDENT_AMBULATORY_CARE_PROVIDER_SITE_OTHER): Payer: Medicare Other | Admitting: Family Medicine

## 2019-12-27 ENCOUNTER — Other Ambulatory Visit (HOSPITAL_COMMUNITY)
Admission: AD | Admit: 2019-12-27 | Discharge: 2019-12-27 | Disposition: A | Discharge status: Home / Self Care | Payer: Medicare Other | Source: Skilled Nursing Facility | Attending: Family Medicine | Admitting: Family Medicine

## 2019-12-27 VITALS — BP 135/80 | HR 110 | Temp 98.5°F | Resp 15 | Ht 64.0 in | Wt 143.0 lb

## 2019-12-27 DIAGNOSIS — N3001 Acute cystitis with hematuria: Secondary | ICD-10-CM | POA: Insufficient documentation

## 2019-12-27 DIAGNOSIS — I10 Essential (primary) hypertension: Secondary | ICD-10-CM

## 2019-12-27 LAB — POCT URINALYSIS DIP (CLINITEK)
Bilirubin, UA: NEGATIVE
Glucose, UA: NEGATIVE mg/dL
Ketones, POC UA: NEGATIVE mg/dL
Nitrite, UA: NEGATIVE
POC PROTEIN,UA: >=300 — AB
Spec Grav, UA: 1.025 (ref 1.010–1.025)
Urobilinogen, UA: 0.2 E.U./dL
pH, UA: 6 (ref 5.0–8.0)

## 2019-12-27 MED ORDER — AMPICILLIN 500 MG PO CAPS
500.0000 mg | ORAL_CAPSULE | Freq: Three times a day (TID) | ORAL | 0 refills | Status: DC
Start: 2019-12-27 — End: 2019-12-29

## 2019-12-27 NOTE — Patient Instructions (Addendum)
F/u as before call if you need  Me sooner  You are treated for acute bladder infection , take entire antibiotic course and ensure you drink 8 glasses water daily AND empty often  Working at normalising blood pressure and blood sugar will improve the health of your kidneys

## 2019-12-27 NOTE — Progress Notes (Signed)
   [name]     MRN: [id number]      DOB: [birth year]   HPI Ms. Tangney is here with a 1 week h/o dysuria , frequency no visible hematuria  No flank pain, nausea, fever or chills  ROS Denies recent fever or chills. Denies sinus pressure, nasal congestion, ear pain or sore throat. Denies chest congestion, productive cough or wheezing. Denies chest pains, palpitations and leg swelling Denies abdominal pain, nausea, vomiting,diarrhea or constipation.   Denies joint pain, swelling and limitation in mobility. Denies headaches, seizures, numbness, or tingling. Denies depression, anxiety or insomnia. Denies skin break down or rash.   PE  BP 135/80   Pulse (!) 110   Temp 98.5 F (36.9 C) (Temporal)   Resp 15   Ht 5\' 4"  (1.626 m)   Wt 143 lb (64.9 kg)   SpO2 96%   BMI 24.55 kg/m   Patient alert and oriented and in no cardiopulmonary distress.  HEENT: No facial asymmetry, EOMI,     Neck supple .  Chest: Clear to auscultation bilaterally.  CVS: S1, S2 no murmurs, no S3.Regular rate.  ABD: Soft non tender. Mild supra pubic and no renal angle tenderness  Ext: No edema  MS: Adequate ROM spine, shoulders, hips and knees.  Skin: Intact, no ulcerations or rash noted.  Psych: Good eye contact, normal affect. Memory intact not anxious or depressed appearing.  CNS: CN 2-12 intact, power,  normal throughout.no focal deficits noted.   Assessment & Plan  Acute cystitis Symptomatic with abnormal UA, send for c/s and start ampicillin, push fluids  Essential hypertension Controlled, no change in medication

## 2019-12-28 ENCOUNTER — Telehealth: Payer: Self-pay

## 2019-12-28 ENCOUNTER — Other Ambulatory Visit: Payer: Self-pay

## 2019-12-28 ENCOUNTER — Encounter (HOSPITAL_COMMUNITY): Payer: Self-pay | Admitting: *Deleted

## 2019-12-28 DIAGNOSIS — Z7984 Long term (current) use of oral hypoglycemic drugs: Secondary | ICD-10-CM | POA: Diagnosis not present

## 2019-12-28 DIAGNOSIS — Z794 Long term (current) use of insulin: Secondary | ICD-10-CM | POA: Insufficient documentation

## 2019-12-28 DIAGNOSIS — R300 Dysuria: Secondary | ICD-10-CM | POA: Diagnosis present

## 2019-12-28 DIAGNOSIS — E119 Type 2 diabetes mellitus without complications: Secondary | ICD-10-CM | POA: Insufficient documentation

## 2019-12-28 DIAGNOSIS — N3001 Acute cystitis with hematuria: Secondary | ICD-10-CM | POA: Insufficient documentation

## 2019-12-28 DIAGNOSIS — Z87891 Personal history of nicotine dependence: Secondary | ICD-10-CM | POA: Diagnosis not present

## 2019-12-28 DIAGNOSIS — Z7982 Long term (current) use of aspirin: Secondary | ICD-10-CM | POA: Diagnosis not present

## 2019-12-28 DIAGNOSIS — I10 Essential (primary) hypertension: Secondary | ICD-10-CM | POA: Insufficient documentation

## 2019-12-28 LAB — URINALYSIS, ROUTINE W REFLEX MICROSCOPIC
Bilirubin Urine: NEGATIVE
Glucose, UA: NEGATIVE mg/dL
Ketones, ur: NEGATIVE mg/dL
Nitrite: NEGATIVE
Protein, ur: >=300 mg/dL — AB
RBC / HPF: >50 RBC/hpf — ABNORMAL HIGH (ref 0–5)
Specific Gravity, Urine: 1.016 (ref 1.005–1.030)
WBC, UA: >50 WBC/hpf — ABNORMAL HIGH (ref 0–5)
pH: 5 (ref 5.0–8.0)

## 2019-12-28 NOTE — Telephone Encounter (Signed)
Pt called asking if she could get a dr to write her an rx for a uti she was diagnosed with yesterday at her pcp. Pt said they gave her some medicine but it had side effects and had to stop taking it. I explained we could not just write an Rx that another dr had txed. She asked if she could leave a urine spec today and then get another med. I explained we could not do that either. I suggested if she really needed  something to call the pcp at the on call number or she couls leave a spec Monday here if she did not want to do that. But that it would take 1 to 2 days to get report back. She stated she couldn't get help anywhere.

## 2019-12-28 NOTE — Telephone Encounter (Signed)
Called patient to verify if it was she that left a vm on my machine. Left vm asking her to return my call if she left message.

## 2019-12-28 NOTE — ED Triage Notes (Signed)
Pain and burning with urination, states she was prescribed ampicillin by her PCP yesterday and was having more blood in urine, urine is clear today

## 2019-12-28 NOTE — Telephone Encounter (Signed)
Patient came into the office to discuss her antibiotic. She stated she took two doses and had bleeding. Not just on the tissue but in the toilet as well and is wanting another antibiotic. Advised patient I would send message to provider but also advised patient provider is not in office on Fridays and works remotely.

## 2019-12-29 ENCOUNTER — Emergency Department (HOSPITAL_COMMUNITY)
Admission: EM | Admit: 2019-12-29 | Discharge: 2019-12-29 | Disposition: A | Discharge status: Home / Self Care | Payer: Medicare Other | Attending: Emergency Medicine | Admitting: Emergency Medicine

## 2019-12-29 DIAGNOSIS — N3001 Acute cystitis with hematuria: Secondary | ICD-10-CM

## 2019-12-29 MED ORDER — CEFDINIR 300 MG PO CAPS
300.0000 mg | ORAL_CAPSULE | Freq: Two times a day (BID) | ORAL | 0 refills | Status: DC
Start: 2019-12-29 — End: 2019-12-31

## 2019-12-29 MED ORDER — LIDOCAINE-EPINEPHRINE (PF) 1 %-1:200000 IJ SOLN
2.1000 mL | Freq: Once | INTRAMUSCULAR | Status: AC
  Administered 2019-12-29: 2.1 mL via INTRADERMAL
  Filled 2019-12-29: qty 30

## 2019-12-29 MED ORDER — PHENAZOPYRIDINE HCL 200 MG PO TABS
200.0000 mg | ORAL_TABLET | Freq: Three times a day (TID) | ORAL | 0 refills | Status: DC | PRN
Start: 2019-12-29 — End: 2020-02-12

## 2019-12-29 MED ORDER — CEFTRIAXONE SODIUM 1 G IJ SOLR
1.0000 g | Freq: Once | INTRAMUSCULAR | Status: AC
  Administered 2019-12-29: 1 g via INTRAMUSCULAR
  Filled 2019-12-29: qty 10

## 2019-12-29 NOTE — ED Provider Notes (Signed)
[facility] EMERGENCY DEPARTMENT Provider Note   CSN: [id number] Arrival date & time: 12/28/19  1642     History Chief Complaint  Patient presents with  . Dysuria    [name] is a 69 y.o. female.  Patient presents to the emergency department for evaluation of dysuria.  Patient reports that she started having symptoms 1 day ago.  Her primary care doctor started her on ampicillin but patient has persistent symptoms.  No fever, nausea, vomiting, back pain.        Past Medical History:  Diagnosis Date  . BACK PAIN, THORACIC REGION, RIGHT 01/27/2010   Qualifier: Diagnosis of  By: Moshe Cipro MD, Joycelyn Schmid    . Callus of foot 07/15/2017  . Essential hypertension   . Headache(784.0)   . Hematuria 02/24/2016  . History of kidney stones   . Hyperlipidemia   . Irregular heart beats   . Microscopic hematuria 02/24/2016  . Neck pain on right side 01/23/2013  . Piles (hemorrhoids) 06/22/2011  . Renal calculus, right 05/31/2016  . Shoulder pain, right 01/25/2014  . SKIN TAG 06/13/2008   Qualifier: Diagnosis of  By: Moshe Cipro MD, Joycelyn Schmid    . TIA (transient ischemic attack) 06/19/2016  . Type 2 diabetes mellitus ([place])   . Vaginitis     Patient Active Problem List   Diagnosis Date Noted  . Mass of right thigh 08/30/2019  . Bilateral leg numbness 06/05/2019  . Vertigo 12/06/2018  . Dermatomycosis 11/15/2018  . Nephrolithiasis 05/31/2016  . Microscopic hematuria 02/24/2016  . Annual physical exam 06/06/2014  . Acute cystitis 03/27/2013  . Sebaceous cyst 03/24/2010  . Type 2 diabetes mellitus with other specified complication ([place]) 24/46/2863  . Hyperlipidemia LDL goal <100 06/30/2007  . Essential hypertension 06/30/2007    Past Surgical History:  Procedure Laterality Date  . COLONOSCOPY N/A 10/26/2018   Procedure: COLONOSCOPY;  Surgeon: Rogene Houston, MD;  Location: [facility] END[facility] SUITE;  Service: Endoscopy;  Laterality: N/A;  830-10:30am  . cyst removed Right 1998   cyst-  removed from right wrist  . EXTRACORPOREAL SHOCK WAVE LITHOTRIPSY Right 05/29/2018   Procedure: EXTRACORPOREAL SHOCK WAVE LITHOTRIPSY (ESWL);  Surgeon: Kathie Rhodes, MD;  Location: [facility] ORS;  Service: Urology;  Laterality: Right;  . lithotrpsy     over 20 years  . NEPHROLITHOTOMY Right 05/31/2016   Procedure: NEPHROLITHOTOMY PERCUTANEOUS WITH SURGEON ACCESS;  Surgeon: Cleon Gustin, MD;  Location: [facility] ORS;  Service: Urology;  Laterality: Right;  . POLYPECTOMY  10/26/2018   Procedure: POLYPECTOMY;  Surgeon: Rogene Houston, MD;  Location: [facility];  Service: Endoscopy;;  colon  . TOTAL ABDOMINAL HYSTERECTOMY W/ BILATERAL SALPINGOOPHORECTOMY  1998  . TUBAL LIGATION       OB History   No obstetric history on file.     Family History  Problem Relation Age of Onset  . Alcohol abuse Mother   . Esophageal cancer Father   . Alcohol abuse Father   . Diabetes Sister   . Diabetes Sister   . Hypertension Sister   . Heart attack Sister        in 107's  . Seizures Brother     Social History   Tobacco Use  . Smoking status: Never Smoker  . Smokeless tobacco: Never Used  Vaping Use  . Vaping Use: Never used  Substance Use Topics  . Alcohol use: No  . Drug use: No    Home Medications Prior to Admission medications   Medication Sig Start Date  End Date Taking? Authorizing Provider  acetaminophen (TYLENOL) 500 MG tablet Take 1,000 mg by mouth every 6 (six) hours as needed (for headaches/pain.).    [provider]  amLODipine (NORVASC) 5 MG tablet Take 1 tablet (5 mg total) by mouth daily. 07/03/19   Perlie Mayo, NP  aspirin EC 81 MG tablet Take 1 tablet (81 mg total) by mouth daily. 11/02/18   Rogene Houston, MD  atorvastatin (LIPITOR) 10 MG tablet TAKE HALF A TABLET BY MOUTH DAILY 07/03/19   Perlie Mayo, NP  CALCIUM PO Take 1,000 mg by mouth daily.    [provider]  cefdinir (OMNICEF) 300 MG capsule Take 1 capsule (300 mg total) by mouth 2 (two) times  daily. 12/29/19   Orpah Greek, MD  Cholecalciferol (VITAMIN D3) 50 MCG (2000 UT) TABS Take 2,000 Units by mouth daily.    [provider]  clotrimazole-betamethasone (LOTRISONE) cream Apply 1 application topically 2 (two) times daily. Apply to twice daily to rash under arms, breasts and in groin for 14 days, then as needed 11/14/18   Fayrene Helper, MD  glucose blood (ONETOUCH ULTRA) test strip USE TO TEST BLOOD SUGAR 3 TIMES A DAY AS DIRECTED DX E11.65 07/03/19   Perlie Mayo, NP  insulin glargine (LANTUS SOLOSTAR) 100 UNIT/ML Solostar Pen INJECT 22 UNITS INTO THE SKIN DAILY AT 10 PM. 07/03/19   Perlie Mayo, NP  Insulin Pen Needle (B-D ULTRAFINE III SHORT PEN) 31G X 8 MM MISC FOR USE WITH INSULIN ONCE DAILY DX E11.9 07/03/19   Perlie Mayo, NP  metFORMIN (GLUCOPHAGE) 1000 MG tablet Take 1 tablet (1,000 mg total) by mouth 2 (two) times daily with a meal. 07/03/19   Perlie Mayo, NP  Multiple Vitamin (MULTIVITAMIN WITH MINERALS) TABS tablet Take 1 tablet by mouth daily.    [provider]  nystatin ointment (MYCOSTATIN) Apply 1 application topically 2 (two) times daily. Patient taking differently: Apply 1 application topically 2 (two) times daily as needed (skin irritation).  07/13/18   Perlie Mayo, NP  OneTouch Delica Lancets 76L MISC Once daily dx e11.9 (give lancects that go with pts meter) 12/06/18   Fayrene Helper, MD  phenazopyridine (PYRIDIUM) 200 MG tablet Take 1 tablet (200 mg total) by mouth 3 (three) times daily as needed for pain. 12/29/19   Orpah Greek, MD  Polyethyl Glycol-Propyl Glycol (LUBRICANT EYE DROPS) 0.4-0.3 % SOLN Place 1 drop into both eyes 3 (three) times daily as needed (dry/irritated eyes.).    [provider]  spironolactone (ALDACTONE) 25 MG tablet Take 1 tablet (25 mg total) by mouth daily. 12/04/19   Fayrene Helper, MD    Allergies    Onglyza [saxagliptin hydrochloride], Pravastatin, and Ace  inhibitors  Review of Systems   Review of Systems  Genitourinary: Positive for dysuria, frequency and hematuria.  All other systems reviewed and are negative.   Physical Exam Updated Vital Signs BP 120/79 (BP Location: Left Arm)   Pulse 79   Temp 98.8 F (37.1 C) (Oral)   Resp 16   SpO2 96%   Physical Exam Vitals and nursing note reviewed.  Constitutional:      General: She is not in acute distress.    Appearance: Normal appearance. She is well-developed.  HENT:     Head: Normocephalic and atraumatic.     Right Ear: Hearing normal.     Left Ear: Hearing normal.     Nose: Nose  normal.  Eyes:     Conjunctiva/sclera: Conjunctivae normal.     Pupils: Pupils are equal, round, and reactive to light.  Cardiovascular:     Rate and Rhythm: Regular rhythm.     Heart sounds: S1 normal and S2 normal. No murmur heard.  No friction rub. No gallop.   Pulmonary:     Effort: Pulmonary effort is normal. No respiratory distress.     Breath sounds: Normal breath sounds.  Chest:     Chest wall: No tenderness.  Abdominal:     General: Bowel sounds are normal.     Palpations: Abdomen is soft.     Tenderness: There is no abdominal tenderness. There is no guarding or rebound. Negative signs include Murphy's sign and McBurney's sign.     Hernia: No hernia is present.  Musculoskeletal:        General: Normal range of motion.     Cervical back: Normal range of motion and neck supple.  Skin:    General: Skin is warm and dry.     Findings: No rash.  Neurological:     Mental Status: She is alert and oriented to person, place, and time.     GCS: GCS eye subscore is 4. GCS verbal subscore is 5. GCS motor subscore is 6.     Cranial Nerves: No cranial nerve deficit.     Sensory: No sensory deficit.     Coordination: Coordination normal.  Psychiatric:        Speech: Speech normal.        Behavior: Behavior normal.        Thought Content: Thought content normal.     ED Results / Procedures  / Treatments   Labs (all labs ordered are listed, but only abnormal results are displayed) Labs Reviewed  URINALYSIS, ROUTINE W REFLEX MICROSCOPIC - Abnormal; Notable for the following components:      Result Value   APPearance HAZY (*)    Hgb urine dipstick LARGE (*)    Protein, ur >=300 (*)    Leukocytes,Ua LARGE (*)    RBC / HPF >50 (*)    WBC, UA >50 (*)    Bacteria, UA RARE (*)    All other components within normal limits  URINE CULTURE    EKG None  Radiology No results found.  Procedures Procedures (including critical care time)  Medications Ordered in ED Medications  cefTRIAXone (ROCEPHIN) injection 1 g (has no administration in time range)    ED Course  I have reviewed the triage vital signs and the nursing notes.  Pertinent labs & imaging results that were available during my care of the patient were reviewed by me and considered in my medical decision making (see chart for details).    MDM Rules/Calculators/A&P                          Patient with hematuria, urinary frequency and dysuria with obvious urinary tract infection on urinalysis.  She has not been on the ampicillin for a full day yet.  Will culture urine.  Administer Rocephin and discharged on [place].  Final Clinical Impression(s) / ED Diagnoses Final diagnoses:  Acute cystitis with hematuria    Rx / DC Orders ED Discharge Orders         Ordered    cefdinir (OMNICEF) 300 MG capsule  2 times daily        12/29/19 0209    phenazopyridine (PYRIDIUM) 200 MG  tablet  3 times daily PRN        12/29/19 0209           Orpah Greek, MD 12/29/19 0210

## 2019-12-29 NOTE — Discharge Instructions (Signed)
Stop the ampicillin and start taking the new prescription twice a day for 1 week.  If you develop a fever, nausea, vomiting, abdominal or back pain then return to the ER.

## 2019-12-30 ENCOUNTER — Encounter: Payer: Self-pay | Admitting: Family Medicine

## 2019-12-30 LAB — URINE CULTURE: Culture: 80000 — AB

## 2019-12-30 NOTE — Assessment & Plan Note (Signed)
Controlled, no change in medication  

## 2019-12-30 NOTE — Assessment & Plan Note (Signed)
Symptomatic with abnormal UA, send for c/s and start ampicillin, push fluids

## 2019-12-31 ENCOUNTER — Other Ambulatory Visit: Payer: Self-pay

## 2019-12-31 ENCOUNTER — Telehealth: Payer: Self-pay

## 2019-12-31 ENCOUNTER — Telehealth (INDEPENDENT_AMBULATORY_CARE_PROVIDER_SITE_OTHER): Payer: Medicare Other

## 2019-12-31 ENCOUNTER — Other Ambulatory Visit: Payer: Self-pay | Admitting: Family Medicine

## 2019-12-31 VITALS — BP 120/79 | HR 79 | Temp 98.8°F | Resp 16 | Ht 64.0 in | Wt 143.0 lb

## 2019-12-31 DIAGNOSIS — Z0000 Encounter for general adult medical examination without abnormal findings: Secondary | ICD-10-CM

## 2019-12-31 LAB — URINE CULTURE: Culture: 30000 — AB

## 2019-12-31 NOTE — Patient Instructions (Addendum)
Ms. Cygan , Thank you for taking time to come for your Medicare Wellness Visit. I appreciate your ongoing commitment to your health goals. Please review the following plan we discussed and let me know if I can assist you in the future.   Screening recommendations/referrals: Colonoscopy: Due - 10/25/2021  Mammogram: Due - 01/07/2021 Bone Density: Completed, no longer needed.  Recommended yearly ophthalmology/optometry visit for glaucoma screening and checkup Recommended yearly dental visit for hygiene and checkup  Vaccinations: Influenza vaccine: Due 11/2020 Pneumococcal vaccine: Completed; no longer needed after age 44 Tdap vaccine: Due: 04/24/2022  Shingles vaccine: Information provided on Shingrix     Advanced directives: none   Conditions/risks identified: none   Next appointment: 02-12-2020 at 8am with Dr. Moshe Cipro    Preventive Care 70 Years and Older, Female Preventive care refers to lifestyle choices and visits with your health care provider that can promote health and wellness. What does preventive care include?  A yearly physical exam. This is also called an annual well check.  Dental exams once or twice a year.  Routine eye exams. Ask your health care provider how often you should have your eyes checked.  Personal lifestyle choices, including:  Daily care of your teeth and gums.  Regular physical activity.  Eating a healthy diet.  Avoiding tobacco and drug use.  Limiting alcohol use.  Practicing safe sex.  Taking low-dose aspirin every day.  Taking vitamin and mineral supplements as recommended by your health care provider. What happens during an annual well check? The services and screenings done by your health care provider during your annual well check will depend on your age, overall health, lifestyle risk factors, and family history of disease. Counseling  Your health care provider may ask you questions about your:  Alcohol use.  Tobacco use.  Drug  use.  Emotional well-being.  Home and relationship well-being.  Sexual activity.  Eating habits.  History of falls.  Memory and ability to understand (cognition).  Work and work Statistician.  Reproductive health. Screening  You may have the following tests or measurements:  Height, weight, and BMI.  Blood pressure.  Lipid and cholesterol levels. These may be checked every 5 years, or more frequently if you are over 48 years old.  Skin check.  Lung cancer screening. You may have this screening every year starting at age 37 if you have a 30-pack-year history of smoking and currently smoke or have quit within the past 15 years.  Fecal occult blood test (FOBT) of the stool. You may have this test every year starting at age 54.  Flexible sigmoidoscopy or colonoscopy. You may have a sigmoidoscopy every 5 years or a colonoscopy every 10 years starting at age 65.  Hepatitis C blood test.  Hepatitis B blood test.  Sexually transmitted disease (STD) testing.  Diabetes screening. This is done by checking your blood sugar (glucose) after you have not eaten for a while (fasting). You may have this done every 1-3 years.  Bone density scan. This is done to screen for osteoporosis. You may have this done starting at age 43.  Mammogram. This may be done every 1-2 years. Talk to your health care provider about how often you should have regular mammograms. Talk with your health care provider about your test results, treatment options, and if necessary, the need for more tests. Vaccines  Your health care provider may recommend certain vaccines, such as:  Influenza vaccine. This is recommended every year.  Tetanus, diphtheria, and acellular  pertussis (Tdap, Td) vaccine. You may need a Td booster every 10 years.  Zoster vaccine. You may need this after age 90.  Pneumococcal 13-valent conjugate (PCV13) vaccine. One dose is recommended after age 84.  Pneumococcal polysaccharide  (PPSV23) vaccine. One dose is recommended after age 70. Talk to your health care provider about which screenings and vaccines you need and how often you need them. This information is not intended to replace advice given to you by your health care provider. Make sure you discuss any questions you have with your health care provider. Document Released: 03/21/2015 Document Revised: 11/12/2015 Document Reviewed: 12/24/2014 Elsevier Interactive Patient Education  2017 [employer] Prevention in the Home Falls can cause injuries. They can happen to people of all ages. There are many things you can do to make your home safe and to help prevent falls. What can I do on the outside of my home?  Regularly fix the edges of walkways and driveways and fix any cracks.  Remove anything that might make you trip as you walk through a door, such as a raised step or threshold.  Trim any bushes or trees on the path to your home.  Use bright outdoor lighting.  Clear any walking paths of anything that might make someone trip, such as rocks or tools.  Regularly check to see if handrails are loose or broken. Make sure that both sides of any steps have handrails.  Any raised decks and porches should have guardrails on the edges.  Have any leaves, snow, or ice cleared regularly.  Use sand or salt on walking paths during winter.  Clean up any spills in your garage right away. This includes oil or grease spills. What can I do in the bathroom?  Use night lights.  Install grab bars by the toilet and in the tub and shower. Do not use towel bars as grab bars.  Use non-skid mats or decals in the tub or shower.  If you need to sit down in the shower, use a plastic, non-slip stool.  Keep the floor dry. Clean up any water that spills on the floor as soon as it happens.  Remove soap buildup in the tub or shower regularly.  Attach bath mats securely with double-sided non-slip rug tape.  Do not have  throw rugs and other things on the floor that can make you trip. What can I do in the bedroom?  Use night lights.  Make sure that you have a light by your bed that is easy to reach.  Do not use any sheets or blankets that are too big for your bed. They should not hang down onto the floor.  Have a firm chair that has side arms. You can use this for support while you get dressed.  Do not have throw rugs and other things on the floor that can make you trip. What can I do in the kitchen?  Clean up any spills right away.  Avoid walking on wet floors.  Keep items that you use a lot in easy-to-reach places.  If you need to reach something above you, use a strong step stool that has a grab bar.  Keep electrical cords out of the way.  Do not use floor polish or wax that makes floors slippery. If you must use wax, use non-skid floor wax.  Do not have throw rugs and other things on the floor that can make you trip. What can I do with my stairs?  Do not leave any items on the stairs.  Make sure that there are handrails on both sides of the stairs and use them. Fix handrails that are broken or loose. Make sure that handrails are as long as the stairways.  Check any carpeting to make sure that it is firmly attached to the stairs. Fix any carpet that is loose or worn.  Avoid having throw rugs at the top or bottom of the stairs. If you do have throw rugs, attach them to the floor with carpet tape.  Make sure that you have a light switch at the top of the stairs and the bottom of the stairs. If you do not have them, ask someone to add them for you. What else can I do to help prevent falls?  Wear shoes that:  Do not have high heels.  Have rubber bottoms.  Are comfortable and fit you well.  Are closed at the toe. Do not wear sandals.  If you use a stepladder:  Make sure that it is fully opened. Do not climb a closed stepladder.  Make sure that both sides of the stepladder are  locked into place.  Ask someone to hold it for you, if possible.  Clearly mark and make sure that you can see:  Any grab bars or handrails.  First and last steps.  Where the edge of each step is.  Use tools that help you move around (mobility aids) if they are needed. These include:  Canes.  Walkers.  Scooters.  Crutches.  Turn on the lights when you go into a dark area. Replace any light bulbs as soon as they burn out.  Set up your furniture so you have a clear path. Avoid moving your furniture around.  If any of your floors are uneven, fix them.  If there are any pets around you, be aware of where they are.  Review your medicines with your doctor. Some medicines can make you feel dizzy. This can increase your chance of falling. Ask your doctor what other things that you can do to help prevent falls. This information is not intended to replace advice given to you by your health care provider. Make sure you discuss any questions you have with your health care provider. Document Released: 12/19/2008 Document Revised: 07/31/2015 Document Reviewed: 03/29/2014 Elsevier Interactive Patient Education  2017 [employer].

## 2019-12-31 NOTE — Telephone Encounter (Signed)
Pt was seen in the ER Friday afternoon. States she couldn't take the ampicillin because she was scared of the side effects. ER cultured her urine also and sent her home on omnicef. Culture results back and Omnicef isn't listed. Please advise if anything else needs to be given

## 2019-12-31 NOTE — Telephone Encounter (Signed)
Patient aware with verbal understanding.

## 2019-12-31 NOTE — Progress Notes (Signed)
Nitrofurantoin

## 2019-12-31 NOTE — Telephone Encounter (Signed)
I will send in nitrofurantoin for 5 days, based on culture reports. I tried calling several times this weekend to discuss her concern but was unable to get through

## 2019-12-31 NOTE — Telephone Encounter (Signed)
Needs to d/c the omnicef

## 2019-12-31 NOTE — Telephone Encounter (Signed)
Patient aware of results with verbal understanding.

## 2019-12-31 NOTE — Progress Notes (Addendum)
Subjective:   [name] is a 69 y.o. female who presents for Medicare Annual (Subsequent) preventive examination.        Objective:    There were no vitals filed for this visit. There is no height or weight on file to calculate BMI.  Advanced Directives 12/28/2019 10/26/2018 12/26/2017 12/27/2016 10/28/2016 06/19/2016 05/31/2016  Does Patient Have a Medical Advance Directive? No No No No No No No  Would patient like information on creating a medical advance directive? - No - Patient declined Yes (ED - Information included in AVS) (No Data) - No - Patient declined No - Patient declined    Current Medications (verified) Outpatient Encounter Medications as of 12/31/2019  Medication Sig  . acetaminophen (TYLENOL) 500 MG tablet Take 1,000 mg by mouth every 6 (six) hours as needed (for headaches/pain.).  Marland Kitchen amLODipine (NORVASC) 5 MG tablet Take 1 tablet (5 mg total) by mouth daily.  Marland Kitchen aspirin EC 81 MG tablet Take 1 tablet (81 mg total) by mouth daily.  Marland Kitchen atorvastatin (LIPITOR) 10 MG tablet TAKE HALF A TABLET BY MOUTH DAILY  . CALCIUM PO Take 1,000 mg by mouth daily.  . Cholecalciferol (VITAMIN D3) 50 MCG (2000 UT) TABS Take 2,000 Units by mouth daily.  . clotrimazole-betamethasone (LOTRISONE) cream Apply 1 application topically 2 (two) times daily. Apply to twice daily to rash under arms, breasts and in groin for 14 days, then as needed  . glucose blood (ONETOUCH ULTRA) test strip USE TO TEST BLOOD SUGAR 3 TIMES A DAY AS DIRECTED DX E11.65  . insulin glargine (LANTUS SOLOSTAR) 100 UNIT/ML Solostar Pen INJECT 22 UNITS INTO THE SKIN DAILY AT 10 PM.  . Insulin Pen Needle (B-D ULTRAFINE III SHORT PEN) 31G X 8 MM MISC FOR USE WITH INSULIN ONCE DAILY DX E11.9  . metFORMIN (GLUCOPHAGE) 1000 MG tablet Take 1 tablet (1,000 mg total) by mouth 2 (two) times daily with a meal.  . Multiple Vitamin (MULTIVITAMIN WITH MINERALS) TABS tablet Take 1 tablet by mouth daily.  . nitrofurantoin,  macrocrystal-monohydrate, (MACROBID) 100 MG capsule Take 1 capsule (100 mg total) by mouth 2 (two) times daily.  Marland Kitchen nystatin ointment (MYCOSTATIN) Apply 1 application topically 2 (two) times daily. (Patient taking differently: Apply 1 application topically 2 (two) times daily as needed (skin irritation). )  . OneTouch Delica Lancets 05L MISC Once daily dx e11.9 (give lancects that go with pts meter)  . phenazopyridine (PYRIDIUM) 200 MG tablet Take 1 tablet (200 mg total) by mouth 3 (three) times daily as needed for pain.  Vladimir Faster Glycol-Propyl Glycol (LUBRICANT EYE DROPS) 0.4-0.3 % SOLN Place 1 drop into both eyes 3 (three) times daily as needed (dry/irritated eyes.).  Marland Kitchen spironolactone (ALDACTONE) 25 MG tablet Take 1 tablet (25 mg total) by mouth daily.   No facility-administered encounter medications on file as of 12/31/2019.    Allergies (verified) Onglyza [saxagliptin hydrochloride], Pravastatin, and Ace inhibitors   History: Past Medical History:  Diagnosis Date  . BACK PAIN, THORACIC REGION, RIGHT 01/27/2010   Qualifier: Diagnosis of  By: Moshe Cipro MD, Joycelyn Schmid    . Callus of foot 07/15/2017  . Essential hypertension   . Headache(784.0)   . Hematuria 02/24/2016  . History of kidney stones   . Hyperlipidemia   . Irregular heart beats   . Microscopic hematuria 02/24/2016  . Neck pain on right side 01/23/2013  . Piles (hemorrhoids) 06/22/2011  . Renal calculus, right 05/31/2016  . Shoulder pain, right 01/25/2014  .  SKIN TAG 06/13/2008   Qualifier: Diagnosis of  By: Moshe Cipro MD, Joycelyn Schmid    . TIA (transient ischemic attack) 06/19/2016  . Type 2 diabetes mellitus ([place])   . Vaginitis    Past Surgical History:  Procedure Laterality Date  . COLONOSCOPY N/A 10/26/2018   Procedure: COLONOSCOPY;  Surgeon: Rogene Houston, MD;  Location: [facility];  Service: Endoscopy;  Laterality: N/A;  830-10:30am  . cyst removed Right 1998   cyst- removed from right wrist  . EXTRACORPOREAL SHOCK  WAVE LITHOTRIPSY Right 05/29/2018   Procedure: EXTRACORPOREAL SHOCK WAVE LITHOTRIPSY (ESWL);  Surgeon: Kathie Rhodes, MD;  Location: [facility];  Service: Urology;  Laterality: Right;  . lithotrpsy     over 20 years  . NEPHROLITHOTOMY Right 05/31/2016   Procedure: NEPHROLITHOTOMY PERCUTANEOUS WITH SURGEON ACCESS;  Surgeon: Cleon Gustin, MD;  Location: [facility];  Service: Urology;  Laterality: Right;  . POLYPECTOMY  10/26/2018   Procedure: POLYPECTOMY;  Surgeon: Rogene Houston, MD;  Location: [facility];  Service: Endoscopy;;  colon  . TOTAL ABDOMINAL HYSTERECTOMY W/ BILATERAL SALPINGOOPHORECTOMY  1998  . TUBAL LIGATION     Family History  Problem Relation Age of Onset  . Alcohol abuse Mother   . Esophageal cancer Father   . Alcohol abuse Father   . Diabetes Sister   . Diabetes Sister   . Hypertension Sister   . Heart attack Sister        in 17's  . Seizures Brother    Social History   Socioeconomic History  . Marital status: Single    Spouse name: Not on file  . Number of children: 1  . Years of education: Not on file  . Highest education level: 12th grade  Occupational History  . Occupation: employed   Tobacco Use  . Smoking status: Never Smoker  . Smokeless tobacco: Never Used  Vaping Use  . Vaping Use: Never used  Substance and Sexual Activity  . Alcohol use: No  . Drug use: No  . Sexual activity: Not Currently  Other Topics Concern  . Not on file  Social History Narrative  . Not on file   Social Determinants of Health   Financial Resource Strain:   . Difficulty of Paying Living Expenses: Not on file  Food Insecurity:   . Worried About Charity fundraiser in the Last Year: Not on file  . Ran Out of Food in the Last Year: Not on file  Transportation Needs:   . Lack of Transportation (Medical): Not on file  . Lack of Transportation (Non-Medical): Not on file  Physical Activity:   . Days of Exercise per Week: Not on file  . Minutes of Exercise per Session:  Not on file  Stress:   . Feeling of Stress : Not on file  Social Connections:   . Frequency of Communication with Friends and Family: Not on file  . Frequency of Social Gatherings with Friends and Family: Not on file  . Attends Religious Services: Not on file  . Active Member of Clubs or Organizations: Not on file  . Attends Archivist Meetings: Not on file  . Marital Status: Not on file    Tobacco Counseling Counseling given: Not Answered                  Diabetic? Yes          Activities of Daily Living No flowsheet data found.  Patient Care Team: [name]  E, MD as PCP - General Williams Che, MD (Inactive) (Ophthalmology)  Indicate any recent Medical Services you may have received from other than [facility] providers in the past year (date may be approximate).     Assessment:   This is a routine wellness examination for Abeer.  Hearing/Vision screen No exam data present  Dietary issues and exercise activities discussed:    Goals    . DIET - INCREASE WATER INTAKE    . Eat more fruits and vegetables    . Exercise 3x per week (30 min per time)    . Increase physical activity     Walking for 30 mins for 3 days a week       Depression Screen PHQ 2/9 Scores 10/18/2019 06/05/2019 12/28/2018 07/13/2018 02/22/2018 12/26/2017 11/15/2017  PHQ - 2 Score 0 0 0 0 1 0 0  PHQ- 9 Score - - - - 2 - -    Fall Risk Fall Risk  10/18/2019 08/30/2019 07/03/2019 06/05/2019 12/28/2018  Falls in the past year? 0 0 1 0 1  Number falls in past yr: - 0 1 0 0  Injury with Fall? - 0 0 0 1  Risk for fall due to : - - - - -  Follow up - - - Falls evaluation completed -    Any stairs in or around the home? Yes  If so, are there any without handrails? Yes  Home free of loose throw rugs in walkways, pet beds, electrical cords, etc? Yes  Adequate lighting in your home to reduce risk of falls? Yes   ASSISTIVE DEVICES UTILIZED TO PREVENT FALLS:  Life alert?  No  Use of a cane, walker or w/c? No  Grab bars in the bathroom? No  Shower chair or bench in shower? No  Elevated toilet seat or a handicapped toilet? No   TIMED UP AND GO:  Was the test performed? No .     Cognitive Function:     6CIT Screen 12/28/2018 12/26/2017 12/27/2016 02/24/2016  What Year? 0 points 0 points 0 points 0 points  What month? 0 points 0 points 0 points 0 points  What time? 0 points 0 points 0 points 0 points  Count back from 20 0 points 0 points 0 points 0 points  Months in reverse 0 points 0 points 0 points 0 points  Repeat phrase 0 points 4 points 0 points 0 points  Total Score 0 4 0 0    Immunizations Immunization History  Administered Date(s) Administered  . Influenza Split 04/24/2012  . Influenza,inj,Quad PF,6+ Mos 01/25/2014, 03/14/2015, 05/24/2016, 12/27/2016, 11/15/2017, 12/12/2018, 12/04/2019  . PFIZER SARS-COV-2 Vaccination 04/18/2019, 05/09/2019, 12/20/2019  . Pneumococcal Conjugate-13 05/24/2016  . Pneumococcal Polysaccharide-23 12/26/2017  . Tdap 04/24/2012  . Zoster 09/13/2013    TDAP status: Up to date Flu Vaccine status: Up to date Pneumococcal vaccine status: Up to date Covid-19 vaccine status: Completed vaccines  Qualifies for Shingles Vaccine? Yes   Zostavax completed No   Shingrix Completed?: No.    Education has been provided regarding the importance of this vaccine. Patient has been advised to call insurance company to determine out of pocket expense if they have not yet received this vaccine. Advised may also receive vaccine at local pharmacy or Health Dept. Verbalized acceptance and understanding.  Screening Tests Health Maintenance  Topic Date Due  . HEMOGLOBIN A1C  04/19/2020  . FOOT EXAM  07/02/2020  . OPHTHALMOLOGY EXAM  10/15/2020  . URINE MICROALBUMIN  12/03/2020  .  MAMMOGRAM  01/07/2021  . COLONOSCOPY  10/25/2021  . TETANUS/TDAP  04/24/2022  . INFLUENZA VACCINE  Completed  . DEXA SCAN  Completed  . COVID-19  Vaccine  Completed  . Hepatitis C Screening  Completed  . PNA vac Low Risk Adult  Completed    Health Maintenance  There are no preventive care reminders to display for this patient.  Colorectal cancer screening: Completed normal. Repeat every 2 years Mammogram status: Completed normal. Repeat every year Bone Density: Normal; no longer required.   Lung Cancer Screening: (Low Dose CT Chest recommended if Age 78-80 years, 30 pack-year currently smoking OR have quit w/in 15years.) does not qualify.   Additional Screening:  Hepatitis C Screening: does qualify; Completed   Vision Screening: Recommended annual ophthalmology exams for early detection of glaucoma and other disorders of the eye.  Is the patient up to date with their annual eye exam?  Yes  Who is the provider or what is the name of the office in which the patient attends annual eye exams? Dr. Jorja Loa     If pt is not established with a provider, would they like to be referred to a provider to establish care? No .   Dental Screening: Recommended annual dental exams for proper oral hygiene  Community Resource Referral / Chronic Care Management: CRR required this visit?  No   CCM required this visit?  No      Plan:     I have personally reviewed and noted the following in the patient's chart:   . Medical and social history . Use of alcohol, tobacco or illicit drugs  . Current medications and supplements . Functional ability and status . Nutritional status . Physical activity . Advanced directives . List of other physicians . Hospitalizations, surgeries, and ER visits in previous 12 months . Vitals . Screenings to include cognitive, depression, and falls . Referrals and appointments  In addition, I have reviewed and discussed with patient certain preventive protocols, quality metrics, and best practice recommendations. A written personalized care plan for preventive services as well as general preventive health  recommendations were provided to patient.     Lonn Georgia, LPN   [id number]   Nurse Notes: AWV conducted over the phone with pt in the home, and provider in the office. Pt verbalized consent to telehealth visit via audio. Phone call took approx. 77min.

## 2020-01-01 ENCOUNTER — Other Ambulatory Visit: Payer: Self-pay

## 2020-01-01 ENCOUNTER — Telehealth: Payer: Self-pay | Admitting: Emergency Medicine

## 2020-01-01 MED ORDER — NITROFURANTOIN MONOHYD MACRO 100 MG PO CAPS: 100.0000 mg | ORAL_CAPSULE | Freq: Two times a day (BID) | ORAL | 0 refills | Status: DC

## 2020-01-01 NOTE — Telephone Encounter (Signed)
Post ED Visit - Positive Culture Follow-up  Culture report reviewed by antimicrobial stewardship pharmacist: [place] Team []  Elenor Quinones, Pharm.D. []  Heide Guile, Pharm.D., BCPS AQ-ID []  Parks Neptune, Pharm.D., BCPS []  Alycia Rossetti, Pharm.D., BCPS []  [place], Pharm.D., BCPS, AAHIVP []  Legrand Como, Pharm.D., BCPS, AAHIVP []  Salome Arnt, PharmD, BCPS []  Johnnette Gourd, PharmD, BCPS []  Hughes Better, PharmD, BCPS []  Leeroy Cha, PharmD []  Laqueta Linden, PharmD, BCPS []  Albertina Parr, PharmD [place] PharmD  [place] [facility] [place] Team []  Leodis Sias, PharmD []  Lindell Spar, PharmD []  Royetta Asal, PharmD []  Graylin Shiver, Rph []  Rema Fendt) Glennon Mac, PharmD []  Arlyn Dunning, PharmD []  Netta Cedars, PharmD []  Dia Sitter, PharmD []  Leone Haven, PharmD []  Gretta Arab, PharmD []  Theodis Shove, PharmD []  Peggyann Juba, PharmD []  Reuel Boom, PharmD   Positive urine culture Treated with cefdinir, organism sensitive to the same and no further patient follow-up is required at this time.  Hazle Nordmann 01/01/2020, 1:12 PM

## 2020-01-07 ENCOUNTER — Telehealth: Payer: Self-pay

## 2020-01-07 ENCOUNTER — Other Ambulatory Visit: Payer: Self-pay | Admitting: Family Medicine

## 2020-01-07 MED ORDER — FLUCONAZOLE 150 MG PO TABS
150.0000 mg | ORAL_TABLET | Freq: Once | ORAL | 0 refills | Status: AC
Start: 2020-01-07 — End: 2020-01-07

## 2020-01-07 NOTE — Progress Notes (Signed)
Fluconazole 150  

## 2020-01-07 NOTE — Telephone Encounter (Signed)
Patient called in wanting something for a yeast infection. Recently on an antibiotic.

## 2020-01-07 NOTE — Telephone Encounter (Signed)
Patient aware.

## 2020-01-07 NOTE — Telephone Encounter (Signed)
Fluconazole x 1 tablet prescribed, do not take the li[pitor the day she takes the fluconazole, pls let her know

## 2020-01-09 ENCOUNTER — Telehealth: Payer: Self-pay

## 2020-01-09 NOTE — Telephone Encounter (Signed)
Explained to patient she needed to push water. Advised patient she could submit another urine sample but explained to her she couldn't come to the office because we are changing offices. She would need to submit sample at lab. Patient said she would call back tomorrow with her decision about the labs.

## 2020-01-09 NOTE — Telephone Encounter (Signed)
Encourage her to push water intake , she may re submit urine forCCUA and reflex culture, If not abn then will need urology eval

## 2020-01-09 NOTE — Telephone Encounter (Signed)
Patient called and stated she has finished the antibiotic, however, she is still having symptoms. She is having burning with urination, frequency, and urination

## 2020-01-14 ENCOUNTER — Telehealth: Payer: Self-pay

## 2020-01-14 NOTE — Telephone Encounter (Signed)
Patient called and said she was billed for a Physical on 9/28 and the insurance denied it since she had already had a physical this year on 07/03/19.  Please advise.

## 2020-01-15 NOTE — Telephone Encounter (Signed)
Sent e-mail to Graceann Congress to rebill [id number] per Dr Moshe Cipro and called patient to let them know we were rebilling the account.

## 2020-01-15 NOTE — Telephone Encounter (Signed)
Rebilled as 20 minute visit with modifier 25

## 2020-01-15 NOTE — Addendum Note (Signed)
Addended by: [name] E on: 01/15/2020 01:20 PM   Modules accepted: Level of Service

## 2020-01-23 ENCOUNTER — Other Ambulatory Visit (HOSPITAL_COMMUNITY): Payer: Self-pay | Admitting: Nephrology

## 2020-01-23 ENCOUNTER — Other Ambulatory Visit: Payer: Self-pay | Admitting: Nephrology

## 2020-01-23 DIAGNOSIS — E559 Vitamin D deficiency, unspecified: Secondary | ICD-10-CM | POA: Diagnosis not present

## 2020-01-23 DIAGNOSIS — N200 Calculus of kidney: Secondary | ICD-10-CM | POA: Diagnosis not present

## 2020-01-23 DIAGNOSIS — I129 Hypertensive chronic kidney disease with stage 1 through stage 4 chronic kidney disease, or unspecified chronic kidney disease: Secondary | ICD-10-CM | POA: Diagnosis not present

## 2020-01-23 DIAGNOSIS — N189 Chronic kidney disease, unspecified: Secondary | ICD-10-CM | POA: Diagnosis not present

## 2020-01-23 DIAGNOSIS — R809 Proteinuria, unspecified: Secondary | ICD-10-CM | POA: Diagnosis not present

## 2020-01-23 DIAGNOSIS — N182 Chronic kidney disease, stage 2 (mild): Secondary | ICD-10-CM

## 2020-01-23 DIAGNOSIS — N390 Urinary tract infection, site not specified: Secondary | ICD-10-CM

## 2020-01-24 ENCOUNTER — Ambulatory Visit: Payer: Medicare Other | Admitting: Family Medicine

## 2020-01-24 ENCOUNTER — Ambulatory Visit (INDEPENDENT_AMBULATORY_CARE_PROVIDER_SITE_OTHER): Payer: Medicare Other | Admitting: Internal Medicine

## 2020-01-24 ENCOUNTER — Encounter: Payer: Self-pay | Admitting: Internal Medicine

## 2020-01-24 ENCOUNTER — Other Ambulatory Visit: Payer: Self-pay

## 2020-01-24 ENCOUNTER — Telehealth: Payer: Self-pay | Admitting: *Deleted

## 2020-01-24 VITALS — BP 137/77 | HR 96 | Temp 98.4°F | Resp 18 | Ht 64.0 in | Wt 142.1 lb

## 2020-01-24 DIAGNOSIS — N200 Calculus of kidney: Secondary | ICD-10-CM

## 2020-01-24 DIAGNOSIS — R3129 Other microscopic hematuria: Secondary | ICD-10-CM | POA: Diagnosis not present

## 2020-01-24 DIAGNOSIS — N3000 Acute cystitis without hematuria: Secondary | ICD-10-CM

## 2020-01-24 LAB — POCT URINALYSIS DIP (CLINITEK)
Bilirubin, UA: NEGATIVE
Glucose, UA: NEGATIVE mg/dL
Nitrite, UA: NEGATIVE
POC PROTEIN,UA: >=300 — AB
Spec Grav, UA: >=1.03 — AB (ref 1.010–1.025)
Urobilinogen, UA: 0.2 E.U./dL
pH, UA: 5.5 (ref 5.0–8.0)

## 2020-01-24 MED ORDER — FOSFOMYCIN TROMETHAMINE 3 G PO PACK: 3.0000 g | PACK | Freq: Once | ORAL | 0 refills | Status: AC

## 2020-01-24 NOTE — Patient Instructions (Addendum)
Please call Dr Noland Fordyce office to schedule a visit for management of kidney stone.  Urinary Tract Infection, Adult A urinary tract infection (UTI) is an infection of any part of the urinary tract. The urinary tract includes:  The kidneys.  The ureters.  The bladder.  The urethra. These organs make, store, and get rid of pee (urine) in the body. What are the causes? This is caused by germs (bacteria) in your genital area. These germs grow and cause swelling (inflammation) of your urinary tract. What increases the risk? You are more likely to develop this condition if:  You have a small, thin tube (catheter) to drain pee.  You cannot control when you pee or poop (incontinence).  You are female, and: ? You use these methods to prevent pregnancy:  A medicine that kills sperm (spermicide).  A device that blocks sperm (diaphragm). ? You have low levels of a female hormone (estrogen). ? You are pregnant.  You have genes that add to your risk.  You are sexually active.  You take antibiotic medicines.  You have trouble peeing because of: ? A prostate that is bigger than normal, if you are female. ? A blockage in the part of your body that drains pee from the bladder (urethra). ? A kidney stone. ? A nerve condition that affects your bladder (neurogenic bladder). ? Not getting enough to drink. ? Not peeing often enough.  You have other conditions, such as: ? Diabetes. ? A weak disease-fighting system (immune system). ? Sickle cell disease. ? Gout. ? Injury of the spine. What are the signs or symptoms? Symptoms of this condition include:  Needing to pee right away (urgently).  Peeing often.  Peeing small amounts often.  Pain or burning when peeing.  Blood in the pee.  Pee that smells bad or not like normal.  Trouble peeing.  Pee that is cloudy.  Fluid coming from the vagina, if you are female.  Pain in the belly or lower back. Other symptoms  include:  Throwing up (vomiting).  No urge to eat.  Feeling mixed up (confused).  Being tired and grouchy (irritable).  A fever.  Watery poop (diarrhea). How is this treated? This condition may be treated with:  Antibiotic medicine.  Other medicines.  Drinking enough water. Follow these instructions at home:  Medicines  Take over-the-counter and prescription medicines only as told by your doctor.  If you were prescribed an antibiotic medicine, take it as told by your doctor. Do not stop taking it even if you start to feel better. General instructions  Make sure you: ? Pee until your bladder is empty. ? Do not hold pee for a long time. ? Empty your bladder after sex. ? Wipe from front to back after pooping if you are a female. Use each tissue one time when you wipe.  Drink enough fluid to keep your pee pale yellow.  Keep all follow-up visits as told by your doctor. This is important. Contact a doctor if:  You do not get better after 1-2 days.  Your symptoms go away and then come back. Get help right away if:  You have very bad back pain.  You have very bad pain in your lower belly.  You have a fever.  You are sick to your stomach (nauseous).  You are throwing up. Summary  A urinary tract infection (UTI) is an infection of any part of the urinary tract.  This condition is caused by germs in your genital area.  There are many risk factors for a UTI. These include having a small, thin tube to drain pee and not being able to control when you pee or poop.  Treatment includes antibiotic medicines for germs.  Drink enough fluid to keep your pee pale yellow. This information is not intended to replace advice given to you by your health care provider. Make sure you discuss any questions you have with your health care provider. Document Revised: 02/09/2018 Document Reviewed: 09/01/2017 Elsevier Patient Education  2020 [employer].

## 2020-01-24 NOTE — Telephone Encounter (Signed)
[facility] should use [facility] instead of her insurance. If they have any concern, please have them call us.

## 2020-01-24 NOTE — Progress Notes (Signed)
Established Patient Office Visit  Subjective:  Patient ID: [name], female    DOB: 06-Sep-1950  Age: 69 y.o. MRN: [id number]  CC:  Chief Complaint  Patient presents with  . Urinary Tract Infection    pt feels like she has uti saw dr Moshe Cipro in october and was given ampicillan still not better has even been to er burning frequent urination incontinence no odor that she is aware of     HPI Belkis Craig is a 69 year old female with PMH of HTN, type 2 DM, nephrolithiasis and recurrent UTI presents for evaluation of persistent dysuria and urinary frequency.  She has been to ER and was placed on Ampicillin initially. She had red colored urine with it, and was switched to Macrobid considering hematuria. She continued to have dysuria and urinary frequency associated with right flank pain, intermittent, worse with movement and without any radiation. Of note, patient has a h/o nephrolithiasis and had right sided 7-mm kidney stone when she visited Urologist and decided to pursue observation after discussing with Urologist. She denies urinary hesitancy, weak stream or hematuria currently. Denies fever, chills, nausea, vomiting, constipation, diarrhea, melena or hematochezia.  Past Medical History:  Diagnosis Date  . BACK PAIN, THORACIC REGION, RIGHT 01/27/2010   Qualifier: Diagnosis of  By: Moshe Cipro MD, Joycelyn Schmid    . Callus of foot 07/15/2017  . Essential hypertension   . Headache(784.0)   . Hematuria 02/24/2016  . History of kidney stones   . Hyperlipidemia   . Irregular heart beats   . Microscopic hematuria 02/24/2016  . Neck pain on right side 01/23/2013  . Piles (hemorrhoids) 06/22/2011  . Renal calculus, right 05/31/2016  . Shoulder pain, right 01/25/2014  . SKIN TAG 06/13/2008   Qualifier: Diagnosis of  By: Moshe Cipro MD, Joycelyn Schmid    . TIA (transient ischemic attack) 06/19/2016  . Type 2 diabetes mellitus ([place])   . Vaginitis     Past Surgical History:  Procedure Laterality  Date  . COLONOSCOPY N/A 10/26/2018   Procedure: COLONOSCOPY;  Surgeon: Rogene Houston, MD;  Location: [facility];  Service: Endoscopy;  Laterality: N/A;  830-10:30am  . cyst removed Right 1998   cyst- removed from right wrist  . EXTRACORPOREAL SHOCK WAVE LITHOTRIPSY Right 05/29/2018   Procedure: EXTRACORPOREAL SHOCK WAVE LITHOTRIPSY (ESWL);  Surgeon: Kathie Rhodes, MD;  Location: [facility];  Service: Urology;  Laterality: Right;  . lithotrpsy     over 20 years  . NEPHROLITHOTOMY Right 05/31/2016   Procedure: NEPHROLITHOTOMY PERCUTANEOUS WITH SURGEON ACCESS;  Surgeon: Cleon Gustin, MD;  Location: [facility];  Service: Urology;  Laterality: Right;  . POLYPECTOMY  10/26/2018   Procedure: POLYPECTOMY;  Surgeon: Rogene Houston, MD;  Location: [facility];  Service: Endoscopy;;  colon  . TOTAL ABDOMINAL HYSTERECTOMY W/ BILATERAL SALPINGOOPHORECTOMY  1998  . TUBAL LIGATION      Family History  Problem Relation Age of Onset  . Alcohol abuse Mother   . Esophageal cancer Father   . Alcohol abuse Father   . Diabetes Sister   . Diabetes Sister   . Hypertension Sister   . Heart attack Sister        in 85's  . Seizures Brother     Social History   Socioeconomic History  . Marital status: Single    Spouse name: Not on file  . Number of children: 1  . Years of education: Not on file  . Highest education level: 12th grade  Occupational History  .  Occupation: employed   Tobacco Use  . Smoking status: Never Smoker  . Smokeless tobacco: Never Used  Vaping Use  . Vaping Use: Never used  Substance and Sexual Activity  . Alcohol use: No  . Drug use: No  . Sexual activity: Not Currently  Other Topics Concern  . Not on file  Social History Narrative  . Not on file   Social Determinants of Health   Financial Resource Strain: Medium Risk  . Difficulty of Paying Living Expenses: Somewhat hard  Food Insecurity: No Food Insecurity  . Worried About Charity fundraiser in the Last  Year: Never true  . Ran Out of Food in the Last Year: Never true  Transportation Needs: No Transportation Needs  . Lack of Transportation (Medical): No  . Lack of Transportation (Non-Medical): No  Physical Activity: Insufficiently Active  . Days of Exercise per Week: 3 days  . Minutes of Exercise per Session: 30 min  Stress: No Stress Concern Present  . Feeling of Stress : Only a little  Social Connections: Socially Isolated  . Frequency of Communication with Friends and Family: Three times a week  . Frequency of Social Gatherings with Friends and Family: Once a week  . Attends Religious Services: Never  . Active Member of Clubs or Organizations: No  . Attends Archivist Meetings: Never  . Marital Status: Separated  Intimate Partner Violence: Not At Risk  . Fear of Current or Ex-Partner: No  . Emotionally Abused: No  . Physically Abused: No  . Sexually Abused: No    Outpatient Medications Prior to Visit  Medication Sig Dispense Refill  . acetaminophen (TYLENOL) 500 MG tablet Take 1,000 mg by mouth every 6 (six) hours as needed (for headaches/pain.).    Marland Kitchen amLODipine (NORVASC) 5 MG tablet Take 1 tablet (5 mg total) by mouth daily. 90 tablet 1  . aspirin EC 81 MG tablet Take 1 tablet (81 mg total) by mouth daily.    Marland Kitchen atorvastatin (LIPITOR) 10 MG tablet TAKE HALF A TABLET BY MOUTH DAILY 45 tablet 1  . CALCIUM PO Take 1,000 mg by mouth daily.    . Cholecalciferol (VITAMIN D3) 50 MCG (2000 UT) TABS Take 2,000 Units by mouth daily.    . clotrimazole-betamethasone (LOTRISONE) cream Apply 1 application topically 2 (two) times daily. Apply to twice daily to rash under arms, breasts and in groin for 14 days, then as needed 45 g 1  . glucose blood (ONETOUCH ULTRA) test strip USE TO TEST BLOOD SUGAR 3 TIMES A DAY AS DIRECTED DX E11.65 100 strip 5  . insulin glargine (LANTUS SOLOSTAR) 100 UNIT/ML Solostar Pen INJECT 22 UNITS INTO THE SKIN DAILY AT 10 PM. 15 mL 5  . Insulin Pen Needle  (B-D ULTRAFINE III SHORT PEN) 31G X 8 MM MISC FOR USE WITH INSULIN ONCE DAILY DX E11.9 100 each 5  . metFORMIN (GLUCOPHAGE) 1000 MG tablet Take 1 tablet (1,000 mg total) by mouth 2 (two) times daily with a meal. 180 tablet 1  . Multiple Vitamin (MULTIVITAMIN WITH MINERALS) TABS tablet Take 1 tablet by mouth daily.    Marland Kitchen nystatin ointment (MYCOSTATIN) Apply 1 application topically 2 (two) times daily. (Patient taking differently: Apply 1 application topically 2 (two) times daily as needed (skin irritation). ) 30 g 0  . OneTouch Delica Lancets 07M MISC Once daily dx e11.9 (give lancects that go with pts meter) 100 each 5  . phenazopyridine (PYRIDIUM) 200 MG tablet Take 1 tablet (  200 mg total) by mouth 3 (three) times daily as needed for pain. 6 tablet 0  . Polyethyl Glycol-Propyl Glycol (LUBRICANT EYE DROPS) 0.4-0.3 % SOLN Place 1 drop into both eyes 3 (three) times daily as needed (dry/irritated eyes.).    Marland Kitchen spironolactone (ALDACTONE) 25 MG tablet Take 1 tablet (25 mg total) by mouth daily. 90 tablet 3  . nitrofurantoin, macrocrystal-monohydrate, (MACROBID) 100 MG capsule Take 1 capsule (100 mg total) by mouth 2 (two) times daily. (Patient not taking: Reported on 01/24/2020) 10 capsule 0   No facility-administered medications prior to visit.    Allergies  Allergen Reactions  . Onglyza [Saxagliptin Hydrochloride] Swelling    Tongue swell  . Pravastatin Other (See Comments)    Muscle aches  . Ace Inhibitors Cough    ROS Review of Systems  Constitutional: Negative for chills and fever.  HENT: Negative for congestion, sinus pressure, sinus pain and sore throat.   Eyes: Negative for pain and discharge.  Respiratory: Negative for cough and shortness of breath.   Cardiovascular: Negative for chest pain and palpitations.  Gastrointestinal: Negative for abdominal pain, constipation, diarrhea, nausea and vomiting.  Endocrine: Negative for polydipsia and polyuria.  Genitourinary: Positive for  dysuria, flank pain and frequency. Negative for hematuria, urgency, vaginal bleeding and vaginal discharge.  Musculoskeletal: Negative for neck pain and neck stiffness.  Skin: Negative for rash.  Neurological: Negative for dizziness and weakness.  Psychiatric/Behavioral: Negative for agitation and behavioral problems.      Objective:    Physical Exam Vitals reviewed.  Constitutional:      General: She is not in acute distress.    Appearance: She is not diaphoretic.  HENT:     Head: Normocephalic and atraumatic.     Nose: Nose normal. No congestion.     Mouth/Throat:     Mouth: Mucous membranes are moist.     Pharynx: No posterior oropharyngeal erythema.  Eyes:     General: No scleral icterus.    Extraocular Movements: Extraocular movements intact.     Pupils: Pupils are equal, round, and reactive to light.  Cardiovascular:     Rate and Rhythm: Normal rate and regular rhythm.     Heart sounds: No murmur heard.   Pulmonary:     Breath sounds: Normal breath sounds. No wheezing or rales.  Abdominal:     Palpations: Abdomen is soft.     Tenderness: There is abdominal tenderness (Suprapubic). There is right CVA tenderness (Mild). There is no left CVA tenderness.  Musculoskeletal:     Cervical back: Neck supple. No tenderness.     Right lower leg: No edema.     Left lower leg: No edema.  Skin:    General: Skin is warm.     Findings: No rash.  Neurological:     General: No focal deficit present.     Mental Status: She is alert and oriented to person, place, and time.     Sensory: No sensory deficit.     Motor: No weakness.  Psychiatric:        Mood and Affect: Mood normal.        Behavior: Behavior normal.     BP 137/77 (BP Location: Right Arm, Patient Position: Sitting, Cuff Size: Normal)   Pulse 96   Temp 98.4 F (36.9 C) (Oral)   Resp 18   Ht 5\' 4"  (1.626 m)   Wt 142 lb 1.9 oz (64.5 kg)   SpO2 96%   BMI 24.39 kg/m  Wt Readings from Last 3 Encounters:    01/24/20 142 lb 1.9 oz (64.5 kg)  12/31/19 143 lb (64.9 kg)  12/27/19 143 lb (64.9 kg)     There are no preventive care reminders to display for this patient.  There are no preventive care reminders to display for this patient.  Lab Results  Component Value Date   TSH 0.72 02/20/2018   Lab Results  Component Value Date   WBC 4.7 11/16/2018   HGB 12.3 11/16/2018   HCT 38.1 11/16/2018   MCV 88.6 11/16/2018   PLT 362 11/16/2018   Lab Results  Component Value Date   NA 142 06/29/2019   K 3.9 06/29/2019   CO2 28 06/29/2019   GLUCOSE 126 (H) 06/29/2019   BUN 14 06/29/2019   CREATININE 0.56 (L) 06/29/2019   BILITOT 0.5 06/29/2019   ALKPHOS 103 06/19/2016   AST 12 06/29/2019   ALT 13 06/29/2019   PROT 7.4 06/29/2019   ALBUMIN 4.5 06/19/2016   CALCIUM 9.9 06/29/2019   ANIONGAP 11 06/19/2016   Lab Results  Component Value Date   CHOL 217 (H) 06/29/2019   Lab Results  Component Value Date   HDL 86 06/29/2019   Lab Results  Component Value Date   LDLCALC 116 (H) 06/29/2019   Lab Results  Component Value Date   TRIG 59 06/29/2019   Lab Results  Component Value Date   CHOLHDL 2.5 06/29/2019   Lab Results  Component Value Date   HGBA1C 7.9 (A) 10/18/2019      Assessment & Plan:   Problem List Items Addressed This Visit      Genitourinary   Acute cystitis - Primary Could be pyelonephritis in the presence of CVA tenderness. UA shows LE and large blood. Previous culture showed E faecalis. Treated with Macrobid in the past. Persistent symptoms, will try Fosfomycin. F/u urine culture   Relevant Medications   fosfomycin (MONUROL) 3 g PACK   Other Relevant Orders   POCT URINALYSIS DIP (CLINITEK) (Completed)   Urine Culture   Microscopic hematuria Likely related to nephrolithiasis and/or UTI Increase water intake, f/u with Urology   Relevant Orders   Urine Culture    Other Visit Diagnoses    Nephrolithiasis US renal reviewed, shows 7-mm right sided  kidney stone and calciectasia on left side. Advised to increase water intake F/u with Urology for possible surgical management to prevent recurrent UTIs        Meds ordered this encounter  Medications  . fosfomycin (MONUROL) 3 g PACK    Sig: Take 3 g by mouth once for 1 dose.    Dispense:  3 g    Refill:  0    Follow-up: Return if symptoms worsen or fail to improve.    Lindell Spar, MD

## 2020-01-24 NOTE — Telephone Encounter (Signed)
Pt said you told her she could use a coupon and get the medication for 25$ which coupon would this be?

## 2020-01-24 NOTE — Telephone Encounter (Signed)
Called pt to let her know she said she already received it and used the good rx card

## 2020-01-29 LAB — URINE CULTURE

## 2020-02-03 ENCOUNTER — Other Ambulatory Visit: Payer: Self-pay | Admitting: Family Medicine

## 2020-02-04 DIAGNOSIS — E559 Vitamin D deficiency, unspecified: Secondary | ICD-10-CM | POA: Diagnosis not present

## 2020-02-04 DIAGNOSIS — Z794 Long term (current) use of insulin: Secondary | ICD-10-CM | POA: Diagnosis not present

## 2020-02-04 DIAGNOSIS — I10 Essential (primary) hypertension: Secondary | ICD-10-CM | POA: Diagnosis not present

## 2020-02-04 DIAGNOSIS — E1169 Type 2 diabetes mellitus with other specified complication: Secondary | ICD-10-CM | POA: Diagnosis not present

## 2020-02-04 DIAGNOSIS — E785 Hyperlipidemia, unspecified: Secondary | ICD-10-CM | POA: Diagnosis not present

## 2020-02-05 LAB — CMP14+EGFR
ALT: 8 IU/L (ref 0–32)
AST: 10 IU/L (ref 0–40)
Albumin/Globulin Ratio: 1.7 (ref 1.2–2.2)
Albumin: 4.9 g/dL — ABNORMAL HIGH (ref 3.8–4.8)
Alkaline Phosphatase: 133 IU/L — ABNORMAL HIGH (ref 44–121)
BUN/Creatinine Ratio: 20 (ref 12–28)
BUN: 16 mg/dL (ref 8–27)
Bilirubin Total: 0.6 mg/dL (ref 0.0–1.2)
CO2: 25 mmol/L (ref 20–29)
Calcium: 10.4 mg/dL — ABNORMAL HIGH (ref 8.7–10.3)
Chloride: 98 mmol/L (ref 96–106)
Creatinine, Ser: 0.82 mg/dL (ref 0.57–1.00)
GFR calc Af Amer: 84 mL/min/{1.73_m2} (ref >59)
GFR calc non Af Amer: 73 mL/min/{1.73_m2} (ref >59)
Globulin, Total: 2.9 g/dL (ref 1.5–4.5)
Glucose: 139 mg/dL — ABNORMAL HIGH (ref 65–99)
Potassium: 4.4 mmol/L (ref 3.5–5.2)
Sodium: 140 mmol/L (ref 134–144)
Total Protein: 7.8 g/dL (ref 6.0–8.5)

## 2020-02-05 LAB — CBC
Hematocrit: 41.8 % (ref 34.0–46.6)
Hemoglobin: 14 g/dL (ref 11.1–15.9)
MCH: 29.2 pg (ref 26.6–33.0)
MCHC: 33.5 g/dL (ref 31.5–35.7)
MCV: 87 fL (ref 79–97)
Platelets: 439 10*3/uL (ref 150–450)
RBC: 4.8 x10E6/uL (ref 3.77–5.28)
RDW: 12 % (ref 11.7–15.4)
WBC: 4 10*3/uL (ref 3.4–10.8)

## 2020-02-05 LAB — HEMOGLOBIN A1C
Est. average glucose Bld gHb Est-mCnc: 174 mg/dL
Hgb A1c MFr Bld: 7.7 % — ABNORMAL HIGH (ref 4.8–5.6)

## 2020-02-05 LAB — LIPID PANEL
Chol/HDL Ratio: 2.5 ratio (ref 0.0–4.4)
Cholesterol, Total: 203 mg/dL — ABNORMAL HIGH (ref 100–199)
HDL: 81 mg/dL (ref >39)
LDL Chol Calc (NIH): 108 mg/dL — ABNORMAL HIGH (ref 0–99)
Triglycerides: 78 mg/dL (ref 0–149)
VLDL Cholesterol Cal: 14 mg/dL (ref 5–40)

## 2020-02-05 LAB — VITAMIN D 25 HYDROXY (VIT D DEFICIENCY, FRACTURES): Vit D, 25-Hydroxy: 36.1 ng/mL (ref 30.0–100.0)

## 2020-02-08 ENCOUNTER — Other Ambulatory Visit: Payer: Self-pay | Admitting: Family Medicine

## 2020-02-11 DIAGNOSIS — N200 Calculus of kidney: Secondary | ICD-10-CM | POA: Diagnosis not present

## 2020-02-12 ENCOUNTER — Other Ambulatory Visit: Payer: Self-pay

## 2020-02-12 ENCOUNTER — Encounter: Payer: Self-pay | Admitting: Family Medicine

## 2020-02-12 ENCOUNTER — Ambulatory Visit (INDEPENDENT_AMBULATORY_CARE_PROVIDER_SITE_OTHER): Payer: Medicare Other | Admitting: Family Medicine

## 2020-02-12 VITALS — BP 133/81 | HR 100 | Resp 16 | Ht 64.0 in | Wt 136.0 lb

## 2020-02-12 DIAGNOSIS — R350 Frequency of micturition: Secondary | ICD-10-CM | POA: Diagnosis not present

## 2020-02-12 DIAGNOSIS — R300 Dysuria: Secondary | ICD-10-CM | POA: Diagnosis not present

## 2020-02-12 DIAGNOSIS — E7800 Pure hypercholesterolemia, unspecified: Secondary | ICD-10-CM

## 2020-02-12 DIAGNOSIS — Z794 Long term (current) use of insulin: Secondary | ICD-10-CM

## 2020-02-12 DIAGNOSIS — I10 Essential (primary) hypertension: Secondary | ICD-10-CM | POA: Diagnosis not present

## 2020-02-12 DIAGNOSIS — E1169 Type 2 diabetes mellitus with other specified complication: Secondary | ICD-10-CM

## 2020-02-12 MED ORDER — ATORVASTATIN CALCIUM 10 MG PO TABS: ORAL_TABLET | ORAL | 3 refills | Status: DC

## 2020-02-12 NOTE — Progress Notes (Signed)
[name]     MRN: [id number]      DOB: [birth year]   HPI [name] is here for follow up and re-evaluation of chronic medical conditions, medication management and review of any available recent lab and radiology data.  Preventive health is updated, specifically  Cancer screening and Immunization.   Concerned about recurrent uTI, and kidney function , has appt with  Both urology and nephrology, recent labs reviewed in detail, all questions answered as able. The PT denies any adverse reactions to current medications since the last visit.  ROS Denies recent fever or chills. Denies sinus pressure, nasal congestion, ear pain or sore throat. Denies chest congestion, productive cough or wheezing. Denies chest pains, palpitations and leg swelling Denies abdominal pain, nausea, vomiting,diarrhea or constipation.   C/o dysuria and frequency Denies joint pain, swelling and limitation in mobility. Denies headaches, seizures, numbness, or tingling. Denies depression, or insomnia. Denies skin break down or rash.   PE  BP 133/81   Pulse 100   Resp 16   Ht 5\' 4"  (1.626 m)   Wt 136 lb 0.6 oz (61.7 kg)   SpO2 100%   BMI 23.35 kg/m   Patient alert and oriented and in no cardiopulmonary distress.  HEENT: No facial asymmetry, EOMI,     Neck supple .  Chest: Clear to auscultation bilaterally.  CVS: S1, S2 no murmurs, no S3.Regular rate.  ABD: Soft non tender. No renal angle tenderness Ext: No edema  MS: Adequate ROM spine, shoulders, hips and knees.  Skin: Intact, no ulcerations or rash noted.  Psych: Good eye contact, normal affect. Memory intact not anxious or depressed appearing.  CNS: CN 2-12 intact, power,  normal throughout.no focal deficits noted.   Assessment & Plan  Essential hypertension Controlled, no change in medication DASH diet and commitment to daily physical activity for a minimum of 30 minutes discussed and encouraged, as a part of hypertension  management. The importance of attaining a healthy weight is also discussed.  BP/Weight 02/12/2020 01/24/2020 12/31/2019 12/28/2019 12/27/2019 12/04/2019 4/88/8916  Systolic BP 945 038 882 800 349 179 150  Diastolic BP 81 77 79 79 80 98 80  Wt. (Lbs) 136.04 142.12 143 - 143 144.12 145.04  BMI 23.35 24.39 24.55 - 24.55 24.74 24.9       Type 2 diabetes mellitus with other specified complication (HCC) Controlled, no change in medication [name] is reminded of the importance of commitment to daily physical activity for 30 minutes or more, as able and the need to limit carbohydrate intake to 30 to 60 grams per meal to help with blood sugar control.   The need to take medication as prescribed, test blood sugar as directed, and to call between visits if there is a concern that blood sugar is uncontrolled is also discussed.   [name] is reminded of the importance of daily foot exam, annual eye examination, and good blood sugar, blood pressure and cholesterol control.  Diabetic Labs Latest Ref Rng & Units 02/04/2020 12/04/2019 10/18/2019 06/29/2019 11/16/2018  HbA1c 4.8 - 5.6 % 7.7(H) - 7.9(A) 8.0(H) 7.7(H)  Microalbumin Not Estab. ug/mL - 304.0(H) - - 2.3  Micro/Creat Ratio <30 mcg/mg creat - - - - 37(H)  Chol 100 - 199 mg/dL 203(H) - - 217(H) 210(H)  HDL >39 mg/dL 81 - - 86 88  Calc LDL 0 - 99 mg/dL 108(H) - - 116(H) 106(H)  Triglycerides 0 - 149 mg/dL 78 - - 59 71  Creatinine 0.57 -  1.00 mg/dL 0.82 - - 0.56(L) 0.58   BP/Weight 02/12/2020 01/24/2020 12/31/2019 12/28/2019 12/27/2019 12/04/2019 7/67/3419  Systolic BP 379 024 097 353 299 242 683  Diastolic BP 81 77 79 79 80 98 80  Wt. (Lbs) 136.04 142.12 143 - 143 144.12 145.04  BMI 23.35 24.39 24.55 - 24.55 24.74 24.9   Foot/eye exam completion dates Latest Ref Rng & Units 10/16/2019 10/08/2019  Eye Exam No Retinopathy No Retinopathy No Retinopathy  Foot exam Order - - -  Foot Form Completion - - -         Hyperlipidemia Hyperlipidemia:Low fat diet discussed and encouraged.   Lipid Panel  Lab Results  Component Value Date   CHOL 203 (H) 02/04/2020   HDL 81 02/04/2020   LDLCALC 108 (H) 02/04/2020   TRIG 78 02/04/2020   CHOLHDL 2.5 02/04/2020     unControlled, reduce shrimp intake , inc atorvastatin to 10 mg one daily 3 times/ week

## 2020-02-12 NOTE — Assessment & Plan Note (Signed)
Hyperlipidemia:Low fat diet discussed and encouraged.   Lipid Panel  Lab Results  Component Value Date   CHOL 203 (H) 02/04/2020   HDL 81 02/04/2020   LDLCALC 108 (H) 02/04/2020   TRIG 78 02/04/2020   CHOLHDL 2.5 02/04/2020     unControlled, reduce shrimp intake , inc atorvastatin to 10 mg one daily 3 times/ week

## 2020-02-12 NOTE — Assessment & Plan Note (Signed)
Controlled, no change in medication [name] is reminded of the importance of commitment to daily physical activity for 30 minutes or more, as able and the need to limit carbohydrate intake to 30 to 60 grams per meal to help with blood sugar control.   The need to take medication as prescribed, test blood sugar as directed, and to call between visits if there is a concern that blood sugar is uncontrolled is also discussed.   [name] is reminded of the importance of daily foot exam, annual eye examination, and good blood sugar, blood pressure and cholesterol control.  Diabetic Labs Latest Ref Rng & Units 02/04/2020 12/04/2019 10/18/2019 06/29/2019 11/16/2018  HbA1c 4.8 - 5.6 % 7.7(H) - 7.9(A) 8.0(H) 7.7(H)  Microalbumin Not Estab. ug/mL - 304.0(H) - - 2.3  Micro/Creat Ratio <30 mcg/mg creat - - - - 37(H)  Chol 100 - 199 mg/dL 203(H) - - 217(H) 210(H)  HDL >39 mg/dL 81 - - 86 88  Calc LDL 0 - 99 mg/dL 108(H) - - 116(H) 106(H)  Triglycerides 0 - 149 mg/dL 78 - - 59 71  Creatinine 0.57 - 1.00 mg/dL 0.82 - - 0.56(L) 0.58   BP/Weight 02/12/2020 01/24/2020 12/31/2019 12/28/2019 12/27/2019 12/04/2019 9/50/7225  Systolic BP 750 518 335 825 189 842 103  Diastolic BP 81 77 79 79 80 98 80  Wt. (Lbs) 136.04 142.12 143 - 143 144.12 145.04  BMI 23.35 24.39 24.55 - 24.55 24.74 24.9   Foot/eye exam completion dates Latest Ref Rng & Units 10/16/2019 10/08/2019  Eye Exam No Retinopathy No Retinopathy No Retinopathy  Foot exam Order - - -  Foot Form Completion - - -

## 2020-02-12 NOTE — Assessment & Plan Note (Signed)
Controlled, no change in medication DASH diet and commitment to daily physical activity for a minimum of 30 minutes discussed and encouraged, as a part of hypertension management. The importance of attaining a healthy weight is also discussed.  BP/Weight 02/12/2020 01/24/2020 12/31/2019 12/28/2019 12/27/2019 12/04/2019 3/56/7014  Systolic BP 103 013 143 888 757 972 820  Diastolic BP 81 77 79 79 80 98 80  Wt. (Lbs) 136.04 142.12 143 - 143 144.12 145.04  BMI 23.35 24.39 24.55 - 24.55 24.74 24.9

## 2020-02-12 NOTE — Patient Instructions (Signed)
Annual exam in office with mD in April, if due, otherwise follow up visit, call if you need me before  Increase lipitor to 1 tablet every Monday, Wednesday and Friday, continue half tablet Tuesday, Thursday, Saturday and Sunday  Please send urine for  culture today, c/o frequency and burning  Keep appt got renal US and f/u with both nephrology and Urology  Cut back on shrimp  You are referred for diabetic education, talk with Dr Marylene Buerger about Renal diet   It is important that you exercise regularly at least 30 minutes 5 times a week. If you develop chest pain, have severe difficulty breathing, or feel very tired, stop exercising immediately and seek medical attention  Thanks for choosing [facility], we consider it a privelige to serve you.

## 2020-02-14 ENCOUNTER — Ambulatory Visit (HOSPITAL_COMMUNITY)
Admission: RE | Admit: 2020-02-14 | Discharge: 2020-02-14 | Disposition: A | Discharge status: Home / Self Care | Payer: Medicare Other | Source: Ambulatory Visit | Attending: Nephrology | Admitting: Nephrology

## 2020-02-14 ENCOUNTER — Other Ambulatory Visit: Payer: Self-pay

## 2020-02-14 DIAGNOSIS — N200 Calculus of kidney: Secondary | ICD-10-CM

## 2020-02-14 DIAGNOSIS — N182 Chronic kidney disease, stage 2 (mild): Secondary | ICD-10-CM | POA: Diagnosis not present

## 2020-02-14 DIAGNOSIS — N390 Urinary tract infection, site not specified: Secondary | ICD-10-CM | POA: Diagnosis not present

## 2020-02-17 ENCOUNTER — Other Ambulatory Visit: Payer: Self-pay | Admitting: Family Medicine

## 2020-02-17 LAB — URINE CULTURE

## 2020-02-17 MED ORDER — PENICILLIN V POTASSIUM 500 MG PO TABS: 500.0000 mg | ORAL_TABLET | Freq: Three times a day (TID) | ORAL | 0 refills | Status: DC

## 2020-02-18 DIAGNOSIS — N189 Chronic kidney disease, unspecified: Secondary | ICD-10-CM | POA: Diagnosis not present

## 2020-02-18 DIAGNOSIS — E1122 Type 2 diabetes mellitus with diabetic chronic kidney disease: Secondary | ICD-10-CM | POA: Diagnosis not present

## 2020-02-18 DIAGNOSIS — Z1159 Encounter for screening for other viral diseases: Secondary | ICD-10-CM | POA: Diagnosis not present

## 2020-02-18 DIAGNOSIS — I129 Hypertensive chronic kidney disease with stage 1 through stage 4 chronic kidney disease, or unspecified chronic kidney disease: Secondary | ICD-10-CM | POA: Diagnosis not present

## 2020-02-18 DIAGNOSIS — Z79899 Other long term (current) drug therapy: Secondary | ICD-10-CM | POA: Diagnosis not present

## 2020-02-18 DIAGNOSIS — E559 Vitamin D deficiency, unspecified: Secondary | ICD-10-CM | POA: Diagnosis not present

## 2020-02-18 DIAGNOSIS — R809 Proteinuria, unspecified: Secondary | ICD-10-CM | POA: Diagnosis not present

## 2020-02-20 ENCOUNTER — Other Ambulatory Visit: Payer: Self-pay | Admitting: Family Medicine

## 2020-02-21 DIAGNOSIS — I5032 Chronic diastolic (congestive) heart failure: Secondary | ICD-10-CM | POA: Diagnosis not present

## 2020-02-21 DIAGNOSIS — N200 Calculus of kidney: Secondary | ICD-10-CM | POA: Diagnosis not present

## 2020-02-21 DIAGNOSIS — N189 Chronic kidney disease, unspecified: Secondary | ICD-10-CM | POA: Diagnosis not present

## 2020-02-21 DIAGNOSIS — R809 Proteinuria, unspecified: Secondary | ICD-10-CM | POA: Diagnosis not present

## 2020-02-21 DIAGNOSIS — I129 Hypertensive chronic kidney disease with stage 1 through stage 4 chronic kidney disease, or unspecified chronic kidney disease: Secondary | ICD-10-CM | POA: Diagnosis not present

## 2020-03-10 ENCOUNTER — Telehealth: Payer: Self-pay

## 2020-03-11 NOTE — Telephone Encounter (Signed)
I dont have a posting for this patient? Will she need to come in to see you to discuss which option?

## 2020-03-13 ENCOUNTER — Encounter: Payer: Self-pay | Admitting: General Surgery

## 2020-03-13 ENCOUNTER — Other Ambulatory Visit: Payer: Self-pay

## 2020-03-13 ENCOUNTER — Ambulatory Visit (INDEPENDENT_AMBULATORY_CARE_PROVIDER_SITE_OTHER): Payer: Medicare Other | Admitting: General Surgery

## 2020-03-13 VITALS — BP 124/82 | HR 95 | Temp 98.2°F | Resp 12 | Ht 64.0 in | Wt 137.0 lb

## 2020-03-13 DIAGNOSIS — L723 Sebaceous cyst: Secondary | ICD-10-CM | POA: Diagnosis not present

## 2020-03-13 DIAGNOSIS — L089 Local infection of the skin and subcutaneous tissue, unspecified: Secondary | ICD-10-CM | POA: Diagnosis not present

## 2020-03-13 NOTE — Patient Instructions (Signed)
Keep dressing in place. Come tomorrow and Andrey Campanile will change and look at the area. Bandaid daily to area. Call and let us know when we can check on you next week. Let us know when you want to get the stomach cyst and other back cyst removed in clinic.

## 2020-03-13 NOTE — Progress Notes (Signed)
[facility] History and Physical  Reason for Referral: Sebaceous cyst  Referring Physician: Fayrene Helper, MD  Chief Complaint    New Patient (Initial Visit)      Nil Anstett is a 70 y.o. female.  HPI:  Ms. Linares is a very sweet 70 yo who reports having multiple cysts that swell and drain at times. She has had antibiotics for these in the past and says that the areas will come and go. The area on her mid back has been giving her issues for the last week or so and has started to drain. She cannot do dressing changes to this area and does not have anyone at home.    She says that when they are not inflamed or flaring they do not bother her.    Past Medical History:  Diagnosis Date  . BACK PAIN, THORACIC REGION, RIGHT 01/27/2010   Qualifier: Diagnosis of  By: Moshe Cipro MD, Joycelyn Schmid    . Callus of foot 07/15/2017  . Essential hypertension   . Headache(784.0)   . Hematuria 02/24/2016  . History of kidney stones   . Hyperlipidemia   . Irregular heart beats   . Microscopic hematuria 02/24/2016  . Neck pain on right side 01/23/2013  . Piles (hemorrhoids) 06/22/2011  . Renal calculus, right 05/31/2016  . Shoulder pain, right 01/25/2014  . SKIN TAG 06/13/2008   Qualifier: Diagnosis of  By: Moshe Cipro MD, Joycelyn Schmid    . TIA (transient ischemic attack) 06/19/2016  . Type 2 diabetes mellitus ([place])   . Vaginitis     Past Surgical History:  Procedure Laterality Date  . COLONOSCOPY N/A 10/26/2018   Procedure: COLONOSCOPY;  Surgeon: Rogene Houston, MD;  [facility];  Service: Endoscopy;  Laterality: N/A;  830-10:30am  . cyst removed Right 1998   cyst- removed from right wrist  . EXTRACORPOREAL SHOCK WAVE LITHOTRIPSY Right 05/29/2018   Procedure: EXTRACORPOREAL SHOCK WAVE LITHOTRIPSY (ESWL);  Surgeon: Kathie Rhodes, MD;  [facility]: [facility];  Service: Urology;  Laterality: Right;  . lithotrpsy     over 20 years  . NEPHROLITHOTOMY Right 05/31/2016    Procedure: NEPHROLITHOTOMY PERCUTANEOUS WITH SURGEON ACCESS;  Surgeon: Cleon Gustin, MD;  Location: [facility];  Service: Urology;  Laterality: Right;  . POLYPECTOMY  10/26/2018   Procedure: POLYPECTOMY;  Surgeon: Rogene Houston, MD;  [facility];  Service: Endoscopy;;  colon  . TOTAL ABDOMINAL HYSTERECTOMY W/ BILATERAL SALPINGOOPHORECTOMY  1998  . TUBAL LIGATION      Family History  Problem Relation Age of Onset  . Alcohol abuse Mother   . Esophageal cancer Father   . Alcohol abuse Father   . Diabetes Sister   . Diabetes Sister   . Hypertension Sister   . Heart attack Sister        in 56's  . Seizures Brother     Social History   Tobacco Use  . Smoking status: Never Smoker  . Smokeless tobacco: Never Used  Vaping Use  . Vaping Use: Never used  Substance Use Topics  . Alcohol use: No  . Drug use: No    Medications: I have reviewed the patient's current medications. Allergies as of 03/13/2020      Reactions   Onglyza [saxagliptin Hydrochloride] Swelling   Tongue swell   Pravastatin Other (See Comments)   Muscle aches   Ace Inhibitors Cough      Medication List       Accurate as of March 13, 2020 11:55 AM. If you have any questions, ask your nurse or doctor.        STOP taking these medications   amLODipine 5 MG tablet Commonly known as: NORVASC Stopped by: Lucretia Roers, MD   penicillin v potassium 500 MG tablet Commonly known as: VEETID Stopped by: Lucretia Roers, MD     TAKE these medications   acetaminophen 500 MG tablet Commonly known as: TYLENOL Take 1,000 mg by mouth every 6 (six) hours as needed (for headaches/pain.).   aspirin EC 81 MG tablet Take 1 tablet (81 mg total) by mouth daily.   atorvastatin 10 MG tablet Commonly known as: LIPITOR Take one tablet by mouth every Monday, Wednesday and Friday, and take half tablet , every Tuesday, Thursday, Saturday and Sunday   B-D ULTRAFINE III SHORT PEN 31G X 8 MM  Misc Generic drug: Insulin Pen Needle FOR USE WITH INSULIN ONCE DAILY DX E11.9   CALCIUM PO Take 1,000 mg by mouth daily.   clotrimazole-betamethasone cream Commonly known as: LOTRISONE Apply 1 application topically 2 (two) times daily. Apply to twice daily to rash under arms, breasts and in groin for 14 days, then as needed   glipiZIDE 10 MG tablet Commonly known as: GLUCOTROL TAKE 1 TABLET BY MOUTH TWICE A DAY BEFORE MEALS   Lantus SoloStar 100 UNIT/ML Solostar Pen Generic drug: insulin glargine INJECT 22 UNITS INTO THE SKIN DAILY AT 10 PM.   losartan 25 MG tablet Commonly known as: COZAAR Take 12.5 mg by mouth daily.   Lubricant Eye Drops 0.4-0.3 % Soln Generic drug: Polyethyl Glycol-Propyl Glycol Place 1 drop into both eyes 3 (three) times daily as needed (dry/irritated eyes.).   metFORMIN 1000 MG tablet Commonly known as: GLUCOPHAGE Take 1 tablet (1,000 mg total) by mouth 2 (two) times daily with a meal.   multivitamin with minerals Tabs tablet Take 1 tablet by mouth daily.   nystatin ointment Commonly known as: MYCOSTATIN Apply 1 application topically 2 (two) times daily. What changed:   when to take this  reasons to take this   OneTouch Delica Lancets 33G Misc Once daily dx e11.9 (give lancects that go with pts meter)   OneTouch Ultra test strip Generic drug: glucose blood USE TO TEST BLOOD SUGAR 3 TIMES A DAY AS DIRECTED DX E11.65   spironolactone 25 MG tablet Commonly known as: ALDACTONE Take 1 tablet (25 mg total) by mouth daily.   Vitamin D3 50 MCG (2000 UT) Tabs Take 2,000 Units by mouth daily.        ROS:  A comprehensive review of systems was negative except for: Ears, nose, mouth, throat, and face: positive for sinus issues Gastrointestinal: positive for reflux symptoms Genitourinary: positive for UTI issues Boils/ cyst on skin  Blood pressure 124/82, pulse 95, temperature 98.2 F (36.8 C), temperature source Oral, resp. rate 12,  height 5\' 4"  (1.626 m), weight 137 lb (62.1 kg), SpO2 96 %. Physical Exam Vitals reviewed.  Constitutional:      Appearance: She is normal weight.  HENT:     Head: Normocephalic.     Nose: Nose normal. No congestion.     Mouth/Throat:     Mouth: Mucous membranes are moist.  Eyes:     Extraocular Movements: Extraocular movements intact.  Cardiovascular:     Rate and Rhythm: Normal rate and regular rhythm.  Pulmonary:     Effort: Pulmonary effort is normal.     Breath sounds: Normal breath sounds.  Abdominal:  General: There is no distension.     Palpations: Abdomen is soft.     Tenderness: There is no abdominal tenderness.     Comments: Epigastric region with 2cm superficial cyst with central black pit, no erythema or drainage  Musculoskeletal:        General: Normal range of motion.     Cervical back: Normal range of motion.     Comments: Central mid back with swollen and erythematous 3cm area, tender to touch, ulcerated area with some drainage on bandage, inferior left flank with 1cm area with black pit, right lower back with 1cm area with black pit, no erythema or drainage  Skin:    General: Skin is warm.  Neurological:     General: No focal deficit present.     Mental Status: She is alert and oriented to person, place, and time.  Psychiatric:        Mood and Affect: Mood normal.        Behavior: Behavior normal.        Thought Content: Thought content normal.        Judgment: Judgment normal.     Results: None   Procedure: Incision and drainage of infected cyst Procedure diagnosis: Infected sebaceous cyst on back   Description: The area was prepped in the standard fashion with betadine. 1% Xylocaine was injected into the area. A 1cm open was made with an 11 blade and bloody drainage with keratin was expressed.  The areas was irrigated with saline. The wound was packed with gauze and covered with gauze and tape. Prior discussed with patient risk of bleeding,  infection, need for packing, and recurrent infection. Will ultimately need excised but too large and inflamed to excise now.    Assessment & Plan:  Axel Hackett is a 70 y.o. female with multiple sebaceous cyst and the back one is infected.  -The back cyst was I&D, she will come to clinic tomorrow to get RN to remove back and place dressing as she has no one to help her  -No need for antibiotics but if skin redness is worsening the office will let me know.  -Can likely remove some of these smaller areas in the office with local   Future Appointments  Date Time Provider [place]  04/01/2020 11:00 AM Virl Cagey, MD RS-RS None  04/16/2020  9:45 AM McKenzie, Candee Furbish, MD AUR-AUR None  04/21/2020  1:00 PM Arlana Lindau, RD NDM-NDMR None  06/17/2020  8:20 AM Fayrene Helper, MD RPC-RPC RPC    All questions were answered to the satisfaction of the patient.   Virl Cagey 03/13/2020, 11:55 AM

## 2020-03-14 NOTE — Telephone Encounter (Signed)
I spoke with Dr. Alyson Ingles concerning patient requesting surgery- pt will need to f/u in the office to discuss surgery . Left message for pt to return call.

## 2020-04-01 ENCOUNTER — Ambulatory Visit: Payer: Medicare Other | Admitting: General Surgery

## 2020-04-03 ENCOUNTER — Other Ambulatory Visit: Payer: Self-pay | Admitting: Family Medicine

## 2020-04-03 ENCOUNTER — Telehealth: Payer: Self-pay

## 2020-04-03 NOTE — Telephone Encounter (Signed)
Med refill metformin 1000 MG tablet  [facility]  Patient called she is completely out of this medication.

## 2020-04-03 NOTE — Telephone Encounter (Signed)
This medication has been sent to the pharmacy  ?

## 2020-04-04 ENCOUNTER — Ambulatory Visit: Payer: Medicare Other | Admitting: Dietician

## 2020-04-07 ENCOUNTER — Other Ambulatory Visit: Payer: Self-pay | Admitting: Family Medicine

## 2020-04-08 ENCOUNTER — Telehealth: Payer: Self-pay

## 2020-04-08 NOTE — Telephone Encounter (Signed)
This patient was billed in error 12/04/19 [id number] per Dr Moshe Cipro.  I have asked Graceann Congress to re-review this account since the MD is saying this is a error.  The patient was charged 3 visits in the same session.  Emailed Verdis Frederickson 04/08/20

## 2020-04-10 ENCOUNTER — Other Ambulatory Visit: Payer: Self-pay

## 2020-04-10 ENCOUNTER — Ambulatory Visit (HOSPITAL_COMMUNITY)
Admission: RE | Admit: 2020-04-10 | Discharge: 2020-04-10 | Disposition: A | Discharge status: Home / Self Care | Payer: Medicare Other | Source: Ambulatory Visit | Attending: Urology | Admitting: Urology

## 2020-04-10 ENCOUNTER — Ambulatory Visit (INDEPENDENT_AMBULATORY_CARE_PROVIDER_SITE_OTHER): Payer: Medicare Other | Admitting: Urology

## 2020-04-10 VITALS — BP 115/73 | HR 108 | Temp 98.4°F | Ht 64.0 in | Wt 134.2 lb

## 2020-04-10 DIAGNOSIS — N3001 Acute cystitis with hematuria: Secondary | ICD-10-CM

## 2020-04-10 DIAGNOSIS — N200 Calculus of kidney: Secondary | ICD-10-CM

## 2020-04-10 LAB — URINALYSIS, ROUTINE W REFLEX MICROSCOPIC
Bilirubin, UA: NEGATIVE
Nitrite, UA: NEGATIVE
Specific Gravity, UA: >1.03 — ABNORMAL HIGH (ref 1.005–1.030)
Urobilinogen, Ur: 0.2 mg/dL (ref 0.2–1.0)
pH, UA: 5 (ref 5.0–7.5)

## 2020-04-10 LAB — MICROSCOPIC EXAMINATION
Epithelial Cells (non renal): >10 /hpf — AB (ref 0–10)
RBC, Urine: >30 /hpf — AB (ref 0–2)
Renal Epithel, UA: NONE SEEN /hpf

## 2020-04-10 MED ORDER — NITROFURANTOIN MONOHYD MACRO 100 MG PO CAPS: 100.0000 mg | ORAL_CAPSULE | Freq: Two times a day (BID) | ORAL | 0 refills | Status: DC

## 2020-04-10 NOTE — Progress Notes (Signed)
Urological Symptom Review  Patient is experiencing the following symptoms: Frequent urination Hard to postpone urination Burning/pain with urination Get up at night to urinate Leakage of urine Blood in urine Urinary tract infection  kidney stones   Review of Systems  Gastrointestinal (upper)  : Indigestion/heartburn  Gastrointestinal (lower) : Negative for lower GI symptoms  Constitutional : Negative for symptoms  Skin: Negative for skin symptoms  Eyes: Blurred/double vision  Ear/Nose/Throat : Negative for Ear/Nose/Throat symptoms  Hematologic/Lymphatic: Negative for Hematologic/Lymphatic symptoms  Cardiovascular : Negative for cardiovascular symptoms  Respiratory : Negative for respiratory symptoms  Endocrine: Negative for endocrine symptoms  Musculoskeletal: Back pain Joint pain  Neurological: Negative for neurological symptoms  Psychologic: Negative for psychiatric symptoms

## 2020-04-10 NOTE — Progress Notes (Signed)
04/10/2020 2:24 PM   [name] March 13, 1950 [id number]  Referring provider: Fayrene Helper, MD [address], [place],  [place] [address]  Nephrolithiasis  HPI: [name] is a 70yo here for evaluation of nephrolithiasis. She was last seen in Jan 2020. She has been having intermittent right flank pain for over 6 months. KUB from today shows a 1.5cm right renal calculus and a 1cm left renal calculus. She has been having recurrent UTIs for the past 1 year. She has gotten 6 UTIs in the past year  Her records from [facility] are as follows: I have kidney stones.  HPI: [name] is a 70 year-old female established patient who is here for renal calculi.  The problem is on both sides. She first stated noticing pain on approximately 03/08/2016. This is not her first kidney stone. Her first stone was approximately 04/09/2011. She has had 1 stones prior to getting this one. She is not currently having flank pain, back pain, groin pain, nausea, vomiting, fever or chills. She has not caught a stone in her urine strainer since her symptoms began.   She has had ureteral stent, ureteroscopy, and percutaneous lithotripsy for treatment of her stones in the past.   05/05/2016: She underwent CT for gross hematuria which revelaed a 1.5cm right upj calculus and a 50mm right lower pole calculus   12/30/2016: Reanl US shows left mid pole calculus which is stable at 66mm. NO stone events since last visit.   06/30/2017: She passed a stone 3 weeks ago on her left side. she denies any flank pain currently. No worsening LUTS. She was treated for a UTI 2 weeks ago.   09/26/2017: No stone events since last visit. Renal US shows bilateral renal calculi. 35mm on right, 25mm on left. She stopped the sodium bicarb   03/28/2018: For over 2 weeks she has had bilateral flank pain. Renal US shows bilateral hydronephrosis and a 1.4cm right UPJ calculus. UA is concerning for infection. CT showed a right renal pelvis  calculus.     PMH: Past Medical History:  Diagnosis Date  . BACK PAIN, THORACIC REGION, RIGHT 01/27/2010   Qualifier: Diagnosis of  By: Moshe Cipro MD, Joycelyn Schmid    . Callus of foot 07/15/2017  . Essential hypertension   . Headache(784.0)   . Hematuria 02/24/2016  . History of kidney stones   . Hyperlipidemia   . Irregular heart beats   . Microscopic hematuria 02/24/2016  . Neck pain on right side 01/23/2013  . Piles (hemorrhoids) 06/22/2011  . Renal calculus, right 05/31/2016  . Shoulder pain, right 01/25/2014  . SKIN TAG 06/13/2008   Qualifier: Diagnosis of  By: Moshe Cipro MD, Joycelyn Schmid    . TIA (transient ischemic attack) 06/19/2016  . Type 2 diabetes mellitus ([place])   . Vaginitis     Surgical History: Past Surgical History:  Procedure Laterality Date  . COLONOSCOPY N/A 10/26/2018   Procedure: COLONOSCOPY;  Surgeon: Rogene Houston, MD;  [facility];  Service: Endoscopy;  Laterality: N/A;  830-10:30am  . cyst removed Right 1998   cyst- removed from right wrist  . EXTRACORPOREAL SHOCK WAVE LITHOTRIPSY Right 05/29/2018   Procedure: EXTRACORPOREAL SHOCK WAVE LITHOTRIPSY (ESWL);  Surgeon: Kathie Rhodes, MD;  [facility];  Service: Urology;  Laterality: Right;  . lithotrpsy     over 20 years  . NEPHROLITHOTOMY Right 05/31/2016   Procedure: NEPHROLITHOTOMY PERCUTANEOUS WITH SURGEON ACCESS;  Surgeon: Cleon Gustin, MD;  [facility];  Service: Urology;  Laterality: Right;  . POLYPECTOMY  10/26/2018   Procedure: POLYPECTOMY;  Surgeon: Rogene Houston, MD;  [facility];  Service: Endoscopy;;  colon  . TOTAL ABDOMINAL HYSTERECTOMY W/ BILATERAL SALPINGOOPHORECTOMY  1998  . TUBAL LIGATION      Home Medications:  Allergies as of 04/10/2020      Reactions   Onglyza [saxagliptin Hydrochloride] Swelling   Tongue swell   Pravastatin Other (See Comments)   Muscle aches   Ace Inhibitors Cough      Medication List       Accurate as of April 10, 2020   2:24 PM. If you have any questions, ask your nurse or doctor.        acetaminophen 500 MG tablet Commonly known as: TYLENOL Take 1,000 mg by mouth every 6 (six) hours as needed (for headaches/pain.).   aspirin EC 81 MG tablet Take 1 tablet (81 mg total) by mouth daily.   atorvastatin 10 MG tablet Commonly known as: LIPITOR Take one tablet by mouth every Monday, Wednesday and Friday, and take half tablet , every Tuesday, Thursday, Saturday and Sunday   B-D ULTRAFINE III SHORT PEN 31G X 8 MM Misc Generic drug: Insulin Pen Needle FOR USE WITH INSULIN ONCE DAILY DX E11.9   CALCIUM PO Take 1,000 mg by mouth daily.   clotrimazole-betamethasone cream Commonly known as: LOTRISONE Apply 1 application topically 2 (two) times daily. Apply to twice daily to rash under arms, breasts and in groin for 14 days, then as needed   glipiZIDE 10 MG tablet Commonly known as: GLUCOTROL TAKE 1 TABLET BY MOUTH TWICE A DAY BEFORE MEALS   Lantus SoloStar 100 UNIT/ML Solostar Pen Generic drug: insulin glargine INJECT 22 UNITS INTO THE SKIN DAILY AT 10 PM.   losartan 25 MG tablet Commonly known as: COZAAR Take 12.5 mg by mouth daily.   Lubricant Eye Drops 0.4-0.3 % Soln Generic drug: Polyethyl Glycol-Propyl Glycol Place 1 drop into both eyes 3 (three) times daily as needed (dry/irritated eyes.).   metFORMIN 1000 MG tablet Commonly known as: GLUCOPHAGE TAKE 1 TABLET (1,000 MG TOTAL) BY MOUTH 2 (TWO) TIMES DAILY WITH A MEAL.   multivitamin with minerals Tabs tablet Take 1 tablet by mouth daily.   nystatin ointment Commonly known as: MYCOSTATIN Apply 1 application topically 2 (two) times daily. What changed:   when to take this  reasons to take this   OneTouch Delica Lancets 72Z Misc Once daily dx e11.9 (give lancects that go with pts meter)   OneTouch Ultra test strip Generic drug: glucose blood USE TO TEST BLOOD SUGAR 3 TIMES A DAY AS DIRECTED DX E11.65   spironolactone 25 MG  tablet Commonly known as: ALDACTONE Take 1 tablet (25 mg total) by mouth daily.   Vitamin D3 50 MCG (2000 UT) Tabs Take 2,000 Units by mouth daily.       Allergies:  Allergies  Allergen Reactions  . Onglyza [Saxagliptin Hydrochloride] Swelling    Tongue swell  . Pravastatin Other (See Comments)    Muscle aches  . Ace Inhibitors Cough    Family History: Family History  Problem Relation Age of Onset  . Alcohol abuse Mother   . Esophageal cancer Father   . Alcohol abuse Father   . Diabetes Sister   . Diabetes Sister   . Hypertension Sister   . Heart attack Sister        in 31's  . Seizures Brother     Social  History:  reports that she has never smoked. She has never used smokeless tobacco. She reports that she does not drink alcohol and does not use drugs.  ROS: All other review of systems were reviewed and are negative except what is noted above in HPI  Physical Exam: BP 115/73   Pulse (!) 108   Temp 98.4 F (36.9 C)   Ht 5\' 4"  (1.626 m)   Wt 134 lb 3.2 oz (60.9 kg)   BMI 23.04 kg/m   Constitutional:  Alert and oriented, No acute distress. HEENT: [place] AT, moist mucus membranes.  Trachea midline, no masses. Cardiovascular: No clubbing, cyanosis, or edema. Respiratory: Normal respiratory effort, no increased work of breathing. GI: Abdomen is soft, nontender, nondistended, no abdominal masses GU: No CVA tenderness.  Lymph: No cervical or inguinal lymphadenopathy. Skin: No rashes, bruises or suspicious lesions. Neurologic: Grossly intact, no focal deficits, moving all 4 extremities. Psychiatric: Normal mood and affect.  Laboratory Data: Lab Results  Component Value Date   WBC 4.0 02/04/2020   HGB 14.0 02/04/2020   HCT 41.8 02/04/2020   MCV 87 02/04/2020   PLT 439 02/04/2020    Lab Results  Component Value Date   CREATININE 0.82 02/04/2020    No results found for: PSA  No results found for: TESTOSTERONE  Lab Results  Component Value Date   HGBA1C  7.7 (H) 02/04/2020    Urinalysis    Component Value Date/Time   COLORURINE YELLOW 12/28/2019 1909   APPEARANCEUR HAZY (A) 12/28/2019 1909   APPEARANCEUR Hazy (A) 10/15/2019 1106   LABSPEC 1.016 12/28/2019 1909   PHURINE 5.0 12/28/2019 1909   GLUCOSEU NEGATIVE 12/28/2019 1909   HGBUR LARGE (A) 12/28/2019 1909   BILIRUBINUR negative 01/24/2020 0921   BILIRUBINUR Negative 10/15/2019 1106   KETONESUR trace (5) (A) 01/24/2020 0921   KETONESUR NEGATIVE 12/28/2019 1909   PROTEINUR >=300 (A) 12/28/2019 1909   UROBILINOGEN 0.2 01/24/2020 0921   UROBILINOGEN 0.2 01/25/2014 0845   NITRITE Negative 01/24/2020 0921   NITRITE NEGATIVE 12/28/2019 1909   LEUKOCYTESUR Large (3+) (A) 01/24/2020 0921   LEUKOCYTESUR LARGE (A) 12/28/2019 1909    Lab Results  Component Value Date   LABMICR See below: 10/15/2019   WBCUA >30 (A) 10/15/2019   LABEPIT 0-10 10/15/2019   BACTERIA RARE (A) 12/28/2019    Pertinent Imaging: KUB today: Images reviewed and disucssed with the patient  Results for orders placed during the hospital encounter of 05/29/18  DG Abd 1 View  Narrative CLINICAL DATA:  Renal calculi.  EXAM: ABDOMEN - 1 VIEW  COMPARISON:  Radiograph of May 02, 2018. CT scan of March 28, 2018.  FINDINGS: The bowel gas pattern is normal. Stable large right renal calculus is noted.  IMPRESSION: Stable large right renal calculus is noted. No evidence of bowel obstruction or ileus.   Electronically Signed By: Marijo Conception, M.D. On: 05/29/2018 07:48  No results found for this or any previous visit.  No results found for this or any previous visit.  No results found for this or any previous visit.  Results for orders placed during the hospital encounter of 02/14/20  US RENAL  Narrative CLINICAL DATA:  Initial evaluation for stage 2 chronic kidney disease.  EXAM: RENAL / URINARY TRACT ULTRASOUND COMPLETE  COMPARISON:  Prior CT from 04/20/2016.  FINDINGS: Right  Kidney:  Renal measurements: 11.2 x 4.9 x 5.7 cm = volume: 162.3 mL. Renal echogenicity within normal limits. 1.5 cm stone seen positioned at the  renal pelvis. Mild caliectasis seen proximally at the mid and upper pole. No frank hydronephrosis. No focal renal mass.  Left Kidney:  Renal measurements: 11.7 x 6.4 x 5.1 cm = volume: 199.0 mL. Renal echogenicity within normal limits. 1.3 cm nonobstructive stone present at the lower pole. Minimal caliectasis without hydronephrosis. No focal renal mass.  Bladder:  Appears normal for degree of bladder distention.  Other:  None.  IMPRESSION: 1. 1.5 cm stone positioned at the right renal pelvis. Secondary mild caliectasis at the mid and upper right kidney without frank hydronephrosis. 2. 1.3 cm nonobstructive stone at the lower pole of the left kidney. 3. Renal echogenicity within normal limits.   Electronically Signed By: Jeannine Boga M.D. On: 02/14/2020 22:38  No results found for this or any previous visit.  No results found for this or any previous visit.  No results found for this or any previous visit.   Assessment & Plan:    1. Nephrolithiasis -We discussed the management of kidney stones. These options include observation, ureteroscopy, shockwave lithotripsy (ESWL) and percutaneous nephrolithotomy (PCNL). We discussed which options are relevant to the patient's stone(s). We discussed the natural history of kidney stones as well as the complications of untreated stones and the impact on quality of life without treatment as well as with each of the above listed treatments. We also discussed the efficacy of each treatment in its ability to clear the stone burden. With any of these management options I discussed the signs and symptoms of infection and the need for emergent treatment should these be experienced. For each option we discussed the ability of each procedure to clear the patient of their stone burden.   For  observation I described the risks which include but are not limited to silent renal damage, life-threatening infection, need for emergent surgery, failure to pass stone and pain.   For ureteroscopy I described the risks which include bleeding, infection, damage to contiguous structures, positioning injury, ureteral stricture, ureteral avulsion, ureteral injury, need for prolonged ureteral stent, inability to perform ureteroscopy, need for an interval procedure, inability to clear stone burden, stent discomfort/pain, heart attack, stroke, pulmonary embolus and the inherent risks with general anesthesia.   For shockwave lithotripsy I described the risks which include arrhythmia, kidney contusion, kidney hemorrhage, need for transfusion, pain, inability to adequately break up stone, inability to pass stone fragments, Steinstrasse, infection associated with obstructing stones, need for alternate surgical procedure, need for repeat shockwave lithotripsy, MI, CVA, PE and the inherent risks with anesthesia/conscious sedation.   For PCNL I described the risks including positioning injury, pneumothorax, hydrothorax, need for chest tube, inability to clear stone burden, renal laceration, arterial venous fistula or malformation, need for embolization of kidney, loss of kidney or renal function, need for repeat procedure, need for prolonged nephrostomy tube, ureteral avulsion, MI, CVA, PE and the inherent risks of general anesthesia.   - The patient would like to proceed with right ureteroscopic stone extraction - Urinalysis, Routine w reflex microscopic - Urinalysis, Routine w reflex microscopic  2. Acute cystitis: -macrobid 100mg  BID for 7 days then 50mg  daily until surgery -Urine for culture   No follow-ups on file.  [name], MD  [facility] [place]

## 2020-04-11 ENCOUNTER — Ambulatory Visit: Payer: Medicare Other | Admitting: Urology

## 2020-04-14 ENCOUNTER — Encounter: Payer: Self-pay | Admitting: Urology

## 2020-04-14 NOTE — Patient Instructions (Signed)

## 2020-04-15 LAB — URINE CULTURE

## 2020-04-16 ENCOUNTER — Other Ambulatory Visit: Payer: Self-pay

## 2020-04-16 ENCOUNTER — Ambulatory Visit: Payer: Medicare Other | Admitting: Urology

## 2020-04-16 ENCOUNTER — Telehealth: Payer: Self-pay

## 2020-04-16 DIAGNOSIS — N3001 Acute cystitis with hematuria: Secondary | ICD-10-CM

## 2020-04-16 MED ORDER — NITROFURANTOIN MONOHYD MACRO 100 MG PO CAPS: 100.0000 mg | ORAL_CAPSULE | Freq: Two times a day (BID) | ORAL | 0 refills | Status: DC

## 2020-04-16 NOTE — Telephone Encounter (Signed)
Pt called and she wanted a refill on antibiotic. Dr. Alyson Ingles oked. Rx sent. Pt also wanted to ask Amanda,RN if her sx could be rescheduled because it is day before birthday. Wanted it rescheduled for 1 wk. Explained there had been a lot of problems getting surgeries scheduled and she may want to keep it. Said if it would be more than a month she would just keep it.

## 2020-04-17 ENCOUNTER — Other Ambulatory Visit: Payer: Self-pay

## 2020-04-18 ENCOUNTER — Ambulatory Visit: Payer: Medicare Other | Admitting: Dietician

## 2020-04-21 ENCOUNTER — Encounter: Payer: Medicare Other | Attending: Family Medicine | Admitting: Nutrition

## 2020-04-21 ENCOUNTER — Telehealth: Payer: Self-pay | Admitting: Nutrition

## 2020-04-21 NOTE — Telephone Encounter (Signed)
TC to patient and left vm. She came to office instead of virtual appointment. Will call and schedule face to face visit.

## 2020-04-21 NOTE — Telephone Encounter (Signed)
TC. She would like to wait til after her procedure in March to reschedule a new appointment. Reassured her that wouldn't be a problem and I would be working in the office as far as I know. She will call back to reschedule appointment.

## 2020-04-22 ENCOUNTER — Telehealth: Payer: Self-pay

## 2020-04-22 ENCOUNTER — Other Ambulatory Visit: Payer: Self-pay

## 2020-04-22 ENCOUNTER — Encounter: Payer: Self-pay | Admitting: General Surgery

## 2020-04-22 ENCOUNTER — Ambulatory Visit: Payer: Medicare Other | Admitting: General Surgery

## 2020-04-22 VITALS — BP 121/77 | HR 91 | Temp 97.6°F | Resp 12 | Ht 64.0 in | Wt 136.0 lb

## 2020-04-22 DIAGNOSIS — D489 Neoplasm of uncertain behavior, unspecified: Secondary | ICD-10-CM

## 2020-04-22 NOTE — Patient Instructions (Signed)
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

## 2020-04-22 NOTE — Progress Notes (Signed)
[facility]  Patient wanting to talk to her insurance before proceeding with excision. She has a lesion on her abdominal wall that is growing and is over 2cm. It is likely a cyst because it does drain some and has required antibiotics.  Curlene Labrum, MD [facility] [address] [place], [place] [address] [phone] (office)

## 2020-04-22 NOTE — Telephone Encounter (Signed)
Called patient. Left message of new pre op and covid appt .

## 2020-05-01 ENCOUNTER — Telehealth: Payer: Self-pay

## 2020-05-01 NOTE — Telephone Encounter (Signed)
Received a message from front desk to return patient.  Called patient and patient needed to verify surgery time/date. Surgery date confirmed with patient for 05/19/2020

## 2020-05-06 ENCOUNTER — Other Ambulatory Visit (HOSPITAL_COMMUNITY): Payer: Medicare Other

## 2020-05-08 DIAGNOSIS — N200 Calculus of kidney: Secondary | ICD-10-CM

## 2020-05-12 ENCOUNTER — Other Ambulatory Visit: Payer: Self-pay

## 2020-05-12 ENCOUNTER — Telehealth: Payer: Self-pay | Admitting: Urology

## 2020-05-12 DIAGNOSIS — N3001 Acute cystitis with hematuria: Secondary | ICD-10-CM

## 2020-05-12 MED ORDER — NITROFURANTOIN MACROCRYSTAL 50 MG PO CAPS: 50.0000 mg | ORAL_CAPSULE | Freq: Two times a day (BID) | ORAL | 0 refills | Status: DC

## 2020-05-12 NOTE — Telephone Encounter (Signed)
Pt called needing antibiotics before her upcoming procedure.

## 2020-05-12 NOTE — Telephone Encounter (Signed)
Pt called and verified of all her future appointments and new prescription sent in for Macrobid 50 mg.

## 2020-05-13 ENCOUNTER — Other Ambulatory Visit: Payer: Self-pay

## 2020-05-13 ENCOUNTER — Ambulatory Visit: Payer: Medicare Other | Admitting: Nutrition

## 2020-05-13 DIAGNOSIS — N3001 Acute cystitis with hematuria: Secondary | ICD-10-CM

## 2020-05-13 NOTE — Telephone Encounter (Signed)
100mg  is too much. She needs to be on 50mg 

## 2020-05-13 NOTE — Telephone Encounter (Signed)
Left message for pt to come in and leave urine specimen per Dr. Alyson Ingles.

## 2020-05-14 ENCOUNTER — Telehealth: Payer: Self-pay

## 2020-05-14 ENCOUNTER — Other Ambulatory Visit: Payer: Medicare Other

## 2020-05-14 ENCOUNTER — Other Ambulatory Visit: Payer: Self-pay

## 2020-05-14 DIAGNOSIS — N390 Urinary tract infection, site not specified: Secondary | ICD-10-CM

## 2020-05-14 DIAGNOSIS — N3001 Acute cystitis with hematuria: Secondary | ICD-10-CM

## 2020-05-14 LAB — URINALYSIS, ROUTINE W REFLEX MICROSCOPIC
Bilirubin, UA: NEGATIVE
Ketones, UA: NEGATIVE
Nitrite, UA: NEGATIVE
Specific Gravity, UA: >1.03 — ABNORMAL HIGH (ref 1.005–1.030)
Urobilinogen, Ur: 0.2 mg/dL (ref 0.2–1.0)
pH, UA: 6 (ref 5.0–7.5)

## 2020-05-14 LAB — MICROSCOPIC EXAMINATION
Epithelial Cells (non renal): >10 /hpf — AB (ref 0–10)
RBC, Urine: >30 /hpf — AB (ref 0–2)
Renal Epithel, UA: NONE SEEN /hpf

## 2020-05-14 MED ORDER — SULFAMETHOXAZOLE-TRIMETHOPRIM 800-160 MG PO TABS: 1.0000 | ORAL_TABLET | Freq: Two times a day (BID) | ORAL | 0 refills | Status: DC

## 2020-05-14 NOTE — Telephone Encounter (Signed)
Pt returned call. She is coming to leave urine specimen for Ua and culture per Dr. Ruel Favors request.

## 2020-05-14 NOTE — Progress Notes (Signed)
Pt notified Bactrim sent in per a verbal from Dr. Alyson Ingles after urine analysis and pt to stop the Macrobid 50 mg until after finishing the Bactrim and then go back to [place]. Pt expressed understanding.

## 2020-05-14 NOTE — Patient Instructions (Signed)
[name]  05/14/2020     @PREFPERIOPPHARMACY @   Your procedure is scheduled on  05/19/2020.   Report to [facility] at  [phone]  A.M.  Call this number if you have problems the morning of surgery:  [phone]    Remember:  Do not eat or drink after midnight.                       Take these medicines the morning of surgery with A SIP OF WATER  None.  Take 11 units of Lantus the night before your procedure. DO NOT taken any medications for diabetes the morning of your procedure.  If your glucose is 70 or below the morning of your procedure, drink 1/2 cup of clear juice and recheck your glucose in 15 minutes. If it is still 70 or below call [phone] for instructions.   If your glucose is 300 or above the morning of your procedure, call [phone] for instructions.     Do not wear jewelry, make-up or nail polish.  Do not wear lotions, powders, or perfumes, or deodorant.  Do not shave 48 hours prior to surgery.  Men may shave face and neck.  Do not bring valuables to the hospital.  [facility] is not responsible for any belongings or valuables.   Contacts, dentures or bridgework may not be worn into surgery.  Leave your suitcase in the car.  After surgery it may be brought to your room.  For patients admitted to the hospital, discharge time will be determined by your treatment team.  Patients discharged the day of surgery will not be allowed to drive home and must have someone with them for 24 hours.   Place clean sheets on your bed the night before your procedure and DO NOT sleep with pets this night.  Shower with CHG the night before and the morning of your procedure. DO NOT use CHG on your face, hair or genitals.  After each shower, dry off with a clean towel, put on clean comfortable clothes and brush your teeth.    Special instructions:  DO NOT smoke tobacco or vape the morning of your procedure.   Please read over the following fact sheets  that you were given. Coughing and Deep Breathing, Surgical Site Infection Prevention, Anesthesia Post-op Instructions and Care and Recovery After Surgery       Ureteral Stent Implantation, Care After This sheet gives you information about how to care for yourself after your procedure. Your health care provider may also give you more specific instructions. If you have problems or questions, contact your health care provider. What can I expect after the procedure? After the procedure, it is common to have:  Nausea.  Mild pain when you urinate. You may feel this pain in your lower back or lower abdomen. The pain should stop within a few minutes after you urinate. This may last for up to 1 week.  A small amount of blood in your urine for several days. Follow these instructions at home: Medicines  Take over-the-counter and prescription medicines only as told by your health care provider.  If you were prescribed an antibiotic medicine, take it as told by your health care provider. Do not stop taking the antibiotic even if you start to feel better.  Do not drive for 24 hours if you were given a sedative during your procedure.  Ask your health care provider if the medicine prescribed  to you requires you to avoid driving or using heavy machinery. Activity  Rest as told by your health care provider.  Avoid sitting for a long time without moving. Get up to take short walks every 1-2 hours. This is important to improve blood flow and breathing. Ask for help if you feel weak or unsteady.  Return to your normal activities as told by your health care provider. Ask your health care provider what activities are safe for you. General instructions  Watch for any blood in your urine. Call your health care provider if the amount of blood in your urine increases.  If you have a catheter: ? Follow instructions from your health care provider about taking care of your catheter and collection bag. ? Do  not take baths, swim, or use a hot tub until your health care provider approves. Ask your health care provider if you may take showers. You may only be allowed to take sponge baths.  Drink enough fluid to keep your urine pale yellow.  Do not use any products that contain nicotine or tobacco, such as cigarettes, e-cigarettes, and chewing tobacco. These can delay healing after surgery. If you need help quitting, ask your health care provider.  Keep all follow-up visits as told by your health care provider. This is important.   Contact a health care provider if:  You have pain that gets worse or does not get better with medicine, especially pain when you urinate.  You have difficulty urinating.  You feel nauseous or you vomit repeatedly during a period of more than 2 days after the procedure. Get help right away if:  Your urine is dark red or has blood clots in it.  You are leaking urine (have incontinence).  The end of the stent comes out of your urethra.  You cannot urinate.  You have sudden, sharp, or severe pain in your abdomen or lower back.  You have a fever.  You have swelling or pain in your legs.  You have difficulty breathing. Summary  After the procedure, it is common to have mild pain when you urinate that goes away within a few minutes after you urinate. This may last for up to 1 week.  Watch for any blood in your urine. Call your health care provider if the amount of blood in your urine increases.  Take over-the-counter and prescription medicines only as told by your health care provider.  Drink enough fluid to keep your urine pale yellow. This information is not intended to replace advice given to you by your health care provider. Make sure you discuss any questions you have with your health care provider. Document Revised: 11/29/2017 Document Reviewed: 11/30/2017 Elsevier Patient Education  2021 [employer] Anesthesia, Adult, Care After This sheet  gives you information about how to care for yourself after your procedure. Your health care provider may also give you more specific instructions. If you have problems or questions, contact your health care provider. What can I expect after the procedure? After the procedure, the following side effects are common:  Pain or discomfort at the IV site.  Nausea.  Vomiting.  Sore throat.  Trouble concentrating.  Feeling cold or chills.  Feeling weak or tired.  Sleepiness and fatigue.  Soreness and body aches. These side effects can affect parts of the body that were not involved in surgery. Follow these instructions at home: For the time period you were told by your health care provider:  Rest.  Do not  participate in activities where you could fall or become injured.  Do not drive or use machinery.  Do not drink alcohol.  Do not take sleeping pills or medicines that cause drowsiness.  Do not make important decisions or sign legal documents.  Do not take care of children on your own.   Eating and drinking  Follow any instructions from your health care provider about eating or drinking restrictions.  When you feel hungry, start by eating small amounts of foods that are soft and easy to digest (bland), such as toast. Gradually return to your regular diet.  Drink enough fluid to keep your urine pale yellow.  If you vomit, rehydrate by drinking water, juice, or clear broth. General instructions  If you have sleep apnea, surgery and certain medicines can increase your risk for breathing problems. Follow instructions from your health care provider about wearing your sleep device: ? Anytime you are sleeping, including during daytime naps. ? While taking prescription pain medicines, sleeping medicines, or medicines that make you drowsy.  Have a responsible adult stay with you for the time you are told. It is important to have someone help care for you until you are awake and  alert.  Return to your normal activities as told by your health care provider. Ask your health care provider what activities are safe for you.  Take over-the-counter and prescription medicines only as told by your health care provider.  If you smoke, do not smoke without supervision.  Keep all follow-up visits as told by your health care provider. This is important. Contact a health care provider if:  You have nausea or vomiting that does not get better with medicine.  You cannot eat or drink without vomiting.  You have pain that does not get better with medicine.  You are unable to pass urine.  You develop a skin rash.  You have a fever.  You have redness around your IV site that gets worse. Get help right away if:  You have difficulty breathing.  You have chest pain.  You have blood in your urine or stool, or you vomit blood. Summary  After the procedure, it is common to have a sore throat or nausea. It is also common to feel tired.  Have a responsible adult stay with you for the time you are told. It is important to have someone help care for you until you are awake and alert.  When you feel hungry, start by eating small amounts of foods that are soft and easy to digest (bland), such as toast. Gradually return to your regular diet.  Drink enough fluid to keep your urine pale yellow.  Return to your normal activities as told by your health care provider. Ask your health care provider what activities are safe for you. This information is not intended to replace advice given to you by your health care provider. Make sure you discuss any questions you have with your health care provider. Document Revised: 11/08/2019 Document Reviewed: 06/07/2019 Elsevier Patient Education  2021 [employer].

## 2020-05-15 ENCOUNTER — Other Ambulatory Visit (HOSPITAL_COMMUNITY)
Admission: RE | Admit: 2020-05-15 | Discharge: 2020-05-15 | Disposition: A | Discharge status: Home / Self Care | Payer: Medicare Other | Source: Ambulatory Visit | Attending: Urology | Admitting: Urology

## 2020-05-15 ENCOUNTER — Encounter (HOSPITAL_COMMUNITY): Payer: Self-pay

## 2020-05-15 ENCOUNTER — Other Ambulatory Visit: Payer: Self-pay

## 2020-05-15 ENCOUNTER — Encounter (HOSPITAL_COMMUNITY)
Admission: RE | Admit: 2020-05-15 | Discharge: 2020-05-15 | Disposition: A | Discharge status: Home / Self Care | Payer: Medicare Other | Source: Ambulatory Visit | Attending: Urology | Admitting: Urology

## 2020-05-15 DIAGNOSIS — Z01818 Encounter for other preprocedural examination: Secondary | ICD-10-CM | POA: Insufficient documentation

## 2020-05-15 DIAGNOSIS — Z20822 Contact with and (suspected) exposure to covid-19: Secondary | ICD-10-CM | POA: Insufficient documentation

## 2020-05-15 LAB — CBC WITH DIFFERENTIAL/PLATELET
Abs Immature Granulocytes: 0.01 10*3/uL (ref 0.00–0.07)
Basophils Absolute: 0 10*3/uL (ref 0.0–0.1)
Basophils Relative: 1 %
Eosinophils Absolute: 0.1 10*3/uL (ref 0.0–0.5)
Eosinophils Relative: 1 %
HCT: 41 % (ref 36.0–46.0)
Hemoglobin: 12.3 g/dL (ref 12.0–15.0)
Immature Granulocytes: 0 %
Lymphocytes Relative: 16 %
Lymphs Abs: 0.8 10*3/uL (ref 0.7–4.0)
MCH: 28.3 pg (ref 26.0–34.0)
MCHC: 30 g/dL (ref 30.0–36.0)
MCV: 94.3 fL (ref 80.0–100.0)
Monocytes Absolute: 0.4 10*3/uL (ref 0.1–1.0)
Monocytes Relative: 7 %
Neutro Abs: 3.9 10*3/uL (ref 1.7–7.7)
Neutrophils Relative %: 75 %
Platelets: 423 10*3/uL — ABNORMAL HIGH (ref 150–400)
RBC: 4.35 MIL/uL (ref 3.87–5.11)
RDW: 13.3 % (ref 11.5–15.5)
WBC: 5.2 10*3/uL (ref 4.0–10.5)
nRBC: 0 % (ref 0.0–0.2)

## 2020-05-15 LAB — URINALYSIS, ROUTINE W REFLEX MICROSCOPIC
Bilirubin Urine: NEGATIVE
Glucose, UA: >=500 mg/dL — AB
Ketones, ur: 5 mg/dL — AB
Nitrite: NEGATIVE
Protein, ur: 100 mg/dL — AB
RBC / HPF: >50 RBC/hpf — ABNORMAL HIGH (ref 0–5)
Specific Gravity, Urine: 1.016 (ref 1.005–1.030)
WBC, UA: >50 WBC/hpf — ABNORMAL HIGH (ref 0–5)
pH: 6 (ref 5.0–8.0)

## 2020-05-15 LAB — BASIC METABOLIC PANEL
Anion gap: 13 (ref 5–15)
BUN: 18 mg/dL (ref 8–23)
CO2: 25 mmol/L (ref 22–32)
Calcium: 9.7 mg/dL (ref 8.9–10.3)
Chloride: 98 mmol/L (ref 98–111)
Creatinine, Ser: 0.85 mg/dL (ref 0.44–1.00)
GFR, Estimated: >60 mL/min (ref >60)
Glucose, Bld: 285 mg/dL — ABNORMAL HIGH (ref 70–99)
Potassium: 3.6 mmol/L (ref 3.5–5.1)
Sodium: 136 mmol/L (ref 135–145)

## 2020-05-15 LAB — SARS CORONAVIRUS 2 (TAT 6-24 HRS): SARS Coronavirus 2: NEGATIVE

## 2020-05-15 LAB — HEMOGLOBIN A1C
Hgb A1c MFr Bld: 7.1 % — ABNORMAL HIGH (ref 4.8–5.6)
Mean Plasma Glucose: 157.07 mg/dL

## 2020-05-16 ENCOUNTER — Other Ambulatory Visit: Payer: Self-pay

## 2020-05-16 ENCOUNTER — Other Ambulatory Visit: Payer: Medicare Other | Admitting: Urology

## 2020-05-16 DIAGNOSIS — N390 Urinary tract infection, site not specified: Secondary | ICD-10-CM

## 2020-05-16 LAB — URINE CULTURE

## 2020-05-16 MED ORDER — LEVOFLOXACIN 750 MG PO TABS: 750.0000 mg | ORAL_TABLET | Freq: Every day | ORAL | 0 refills | Status: DC

## 2020-05-16 NOTE — Progress Notes (Signed)
Received message from Day Surgery to review recent UA for pre-op.   UA reviewed with UA done in office this week. UA showed increase in leukocytes after starting new antibiotics  Reviewed with Dr. Alyson Ingles. Order received for Levaquin 750mg  daily until surgery (4 tablets)  I called patient and instructed patient to start levaquin and she would stop bactrim. Pt voiced understanding.  Pre op, Tracey, notified.

## 2020-05-16 NOTE — Progress Notes (Signed)
Urinalysis shows large leukocytes.  Dr.McKenzie's office notified.  He will start patient on Levaquin per Gibson Ramp.

## 2020-05-19 ENCOUNTER — Ambulatory Visit (HOSPITAL_COMMUNITY): Payer: Medicare Other | Admitting: Anesthesiology

## 2020-05-19 ENCOUNTER — Encounter (HOSPITAL_COMMUNITY)
Admission: RE | Disposition: A | Discharge status: Home / Self Care | Payer: Self-pay | Source: Home / Self Care | Attending: Urology

## 2020-05-19 ENCOUNTER — Ambulatory Visit (HOSPITAL_COMMUNITY)
Admission: RE | Admit: 2020-05-19 | Discharge: 2020-05-19 | Disposition: A | Discharge status: Home / Self Care | Payer: Medicare Other | Attending: Urology | Admitting: Urology

## 2020-05-19 ENCOUNTER — Ambulatory Visit (HOSPITAL_COMMUNITY): Payer: Medicare Other

## 2020-05-19 ENCOUNTER — Encounter (HOSPITAL_COMMUNITY): Payer: Self-pay | Admitting: Urology

## 2020-05-19 ENCOUNTER — Other Ambulatory Visit: Payer: Self-pay

## 2020-05-19 DIAGNOSIS — R8281 Pyuria: Secondary | ICD-10-CM | POA: Insufficient documentation

## 2020-05-19 DIAGNOSIS — N200 Calculus of kidney: Secondary | ICD-10-CM

## 2020-05-19 DIAGNOSIS — Z8249 Family history of ischemic heart disease and other diseases of the circulatory system: Secondary | ICD-10-CM | POA: Insufficient documentation

## 2020-05-19 DIAGNOSIS — Z794 Long term (current) use of insulin: Secondary | ICD-10-CM | POA: Insufficient documentation

## 2020-05-19 DIAGNOSIS — Z8673 Personal history of transient ischemic attack (TIA), and cerebral infarction without residual deficits: Secondary | ICD-10-CM | POA: Diagnosis not present

## 2020-05-19 DIAGNOSIS — Z87440 Personal history of urinary (tract) infections: Secondary | ICD-10-CM | POA: Diagnosis not present

## 2020-05-19 DIAGNOSIS — N132 Hydronephrosis with renal and ureteral calculous obstruction: Secondary | ICD-10-CM | POA: Diagnosis present

## 2020-05-19 DIAGNOSIS — Z79899 Other long term (current) drug therapy: Secondary | ICD-10-CM | POA: Insufficient documentation

## 2020-05-19 DIAGNOSIS — Z800 Family history of malignant neoplasm of digestive organs: Secondary | ICD-10-CM | POA: Insufficient documentation

## 2020-05-19 DIAGNOSIS — Z888 Allergy status to other drugs, medicaments and biological substances status: Secondary | ICD-10-CM | POA: Insufficient documentation

## 2020-05-19 DIAGNOSIS — Z87442 Personal history of urinary calculi: Secondary | ICD-10-CM | POA: Diagnosis not present

## 2020-05-19 DIAGNOSIS — Z7984 Long term (current) use of oral hypoglycemic drugs: Secondary | ICD-10-CM | POA: Insufficient documentation

## 2020-05-19 DIAGNOSIS — Z833 Family history of diabetes mellitus: Secondary | ICD-10-CM | POA: Insufficient documentation

## 2020-05-19 DIAGNOSIS — Z820 Family history of epilepsy and other diseases of the nervous system: Secondary | ICD-10-CM | POA: Diagnosis not present

## 2020-05-19 DIAGNOSIS — N202 Calculus of kidney with calculus of ureter: Secondary | ICD-10-CM | POA: Insufficient documentation

## 2020-05-19 DIAGNOSIS — Z811 Family history of alcohol abuse and dependence: Secondary | ICD-10-CM | POA: Diagnosis not present

## 2020-05-19 HISTORY — PX: CYSTOSCOPY WITH RETROGRADE PYELOGRAM, URETEROSCOPY AND STENT PLACEMENT: SHX5789

## 2020-05-19 LAB — GLUCOSE, CAPILLARY
Glucose-Capillary: 153 mg/dL — ABNORMAL HIGH (ref 70–99)
Glucose-Capillary: 152 mg/dL — ABNORMAL HIGH (ref 70–99)

## 2020-05-19 SURGERY — CYSTOURETEROSCOPY, WITH RETROGRADE PYELOGRAM AND STENT INSERTION
Anesthesia: General | Laterality: Right

## 2020-05-19 MED ORDER — LIDOCAINE HCL (CARDIAC) PF 100 MG/5ML IV SOSY
PREFILLED_SYRINGE | INTRAVENOUS | Status: DC | PRN
  Administered 2020-05-19: 100 mg via INTRATRACHEAL

## 2020-05-19 MED ORDER — TRIMETHOPRIM 100 MG PO TABS: 100.0000 mg | ORAL_TABLET | Freq: Every day | ORAL | 0 refills | Status: DC

## 2020-05-19 MED ORDER — CHLORHEXIDINE GLUCONATE 0.12 % MT SOLN
OROMUCOSAL | Status: AC
  Filled 2020-05-19: qty 30

## 2020-05-19 MED ORDER — OXYCODONE-ACETAMINOPHEN 5-325 MG PO TABS: 1.0000 | ORAL_TABLET | ORAL | 0 refills | Status: DC | PRN

## 2020-05-19 MED ORDER — FENTANYL CITRATE (PF) 100 MCG/2ML IJ SOLN
INTRAMUSCULAR | Status: AC
  Filled 2020-05-19: qty 2

## 2020-05-19 MED ORDER — DEXAMETHASONE SODIUM PHOSPHATE 10 MG/ML IJ SOLN
INTRAMUSCULAR | Status: AC
  Filled 2020-05-19: qty 1

## 2020-05-19 MED ORDER — LIDOCAINE HCL (PF) 2 % IJ SOLN
INTRAMUSCULAR | Status: AC
  Filled 2020-05-19: qty 5

## 2020-05-19 MED ORDER — ONDANSETRON HCL 4 MG/2ML IJ SOLN
INTRAMUSCULAR | Status: AC
  Filled 2020-05-19: qty 2

## 2020-05-19 MED ORDER — PROPOFOL 10 MG/ML IV BOLUS
INTRAVENOUS | Status: DC | PRN
  Administered 2020-05-19: 130 mg via INTRAVENOUS

## 2020-05-19 MED ORDER — ONDANSETRON HCL 4 MG PO TABS: 4.0000 mg | ORAL_TABLET | Freq: Every day | ORAL | 1 refills | Status: DC | PRN

## 2020-05-19 MED ORDER — DIATRIZOATE MEGLUMINE 30 % UR SOLN
URETHRAL | Status: AC
  Filled 2020-05-19: qty 100

## 2020-05-19 MED ORDER — LACTATED RINGERS IV SOLN
INTRAVENOUS | Status: DC
  Administered 2020-05-19: 1000 mL via INTRAVENOUS

## 2020-05-19 MED ORDER — SODIUM CHLORIDE 0.9 % IR SOLN
Status: DC | PRN
  Administered 2020-05-19 (×2): 3000 mL via INTRAVESICAL

## 2020-05-19 MED ORDER — DIATRIZOATE MEGLUMINE 30 % UR SOLN
URETHRAL | Status: DC | PRN
  Administered 2020-05-19: 100 mL via URETHRAL

## 2020-05-19 MED ORDER — HYDROMORPHONE HCL 1 MG/ML IJ SOLN: 0.2500 mg | INTRAMUSCULAR | Status: DC | PRN

## 2020-05-19 MED ORDER — ORAL CARE MOUTH RINSE: 15.0000 mL | Freq: Once | OROMUCOSAL | Status: AC

## 2020-05-19 MED ORDER — CHLORHEXIDINE GLUCONATE 0.12 % MT SOLN
15.0000 mL | Freq: Once | OROMUCOSAL | Status: AC
  Administered 2020-05-19: 15 mL via OROMUCOSAL

## 2020-05-19 MED ORDER — ONDANSETRON HCL 4 MG/2ML IJ SOLN: 4.0000 mg | Freq: Once | INTRAMUSCULAR | Status: DC | PRN

## 2020-05-19 MED ORDER — PROPOFOL 10 MG/ML IV BOLUS
INTRAVENOUS | Status: AC
  Filled 2020-05-19: qty 20

## 2020-05-19 MED ORDER — FENTANYL CITRATE (PF) 100 MCG/2ML IJ SOLN
INTRAMUSCULAR | Status: DC | PRN
  Administered 2020-05-19: 50 ug via INTRAVENOUS

## 2020-05-19 MED ORDER — ONDANSETRON HCL 4 MG/2ML IJ SOLN
INTRAMUSCULAR | Status: DC | PRN
  Administered 2020-05-19: 4 mg via INTRAVENOUS

## 2020-05-19 MED ORDER — CEFAZOLIN SODIUM-DEXTROSE 2-4 GM/100ML-% IV SOLN
2.0000 g | INTRAVENOUS | Status: AC
  Administered 2020-05-19: 2 g via INTRAVENOUS
  Filled 2020-05-19: qty 100

## 2020-05-19 SURGICAL SUPPLY — 25 items
BAG DRAIN URO TABLE W/ADPT NS (BAG) ×2 IMPLANT
BAG DRN 8 ADPR NS SKTRN CSTL (BAG) ×1
BAG HAMPER (MISCELLANEOUS) ×2 IMPLANT
CATH INTERMIT  6FR 70CM (CATHETERS) ×2 IMPLANT
CLOTH BEACON ORANGE TIMEOUT ST (SAFETY) ×2 IMPLANT
DECANTER SPIKE VIAL GLASS SM (MISCELLANEOUS) ×2 IMPLANT
EXTRACTOR STONE NITINOL NGAGE (UROLOGICAL SUPPLIES) IMPLANT
GLOVE BIO SURGEON STRL SZ8 (GLOVE) ×4 IMPLANT
GLOVE SURG UNDER POLY LF SZ7 (GLOVE) ×6 IMPLANT
GOWN STRL REUS W/TWL LRG LVL3 (GOWN DISPOSABLE) ×2 IMPLANT
GOWN STRL REUS W/TWL XL LVL3 (GOWN DISPOSABLE) ×2 IMPLANT
GUIDEWIRE STR DUAL SENSOR (WIRE) ×2 IMPLANT
GUIDEWIRE STR ZIPWIRE 035X150 (MISCELLANEOUS) ×2 IMPLANT
IV NS IRRIG 3000ML ARTHROMATIC (IV SOLUTION) ×4 IMPLANT
KIT TURNOVER CYSTO (KITS) ×2 IMPLANT
MANIFOLD NEPTUNE II (INSTRUMENTS) ×2 IMPLANT
PACK CYSTO (CUSTOM PROCEDURE TRAY) ×2 IMPLANT
PAD ARMBOARD 7.5X6 YLW CONV (MISCELLANEOUS) ×2 IMPLANT
STENT URET 6FRX26 CONTOUR (STENTS) ×2 IMPLANT
SYR 10ML LL (SYRINGE) ×2 IMPLANT
SYR CONTROL 10ML LL (SYRINGE) ×2 IMPLANT
TOWEL OR 17X26 4PK STRL BLUE (TOWEL DISPOSABLE) ×2 IMPLANT
TRACTIP FLEXIVA PULS ID 200XHI (Laser) ×1 IMPLANT
TRACTIP FLEXIVA PULSE ID 200 (Laser) ×2
WATER STERILE IRR 500ML POUR (IV SOLUTION) ×2 IMPLANT

## 2020-05-19 NOTE — Transfer of Care (Signed)
Immediate Anesthesia Transfer of Care Note  Patient: [name]  Procedure(s) Performed: CYSTOSCOPY WITH RETROGRADE PYELOGRAM, URETEROSCOPY AND STENT PLACEMENT (Right Bladder)  Patient Location: PACU  Anesthesia Type:General  Level of Consciousness: awake, alert , oriented and patient cooperative  Airway & Oxygen Therapy: Patient Spontanous Breathing  Post-op Assessment: Report given to RN and Post -op Vital signs reviewed and stable  Post vital signs: Reviewed and stable  Last Vitals:  Vitals Value Taken Time  BP 123/75 05/19/20 0817  Temp    Pulse 95 05/19/20 0818  Resp 19 05/19/20 0818  SpO2 96 % 05/19/20 0818  Vitals shown include unvalidated device data.  Last Pain:  Vitals:   05/19/20 0658  TempSrc: Oral  PainSc: 2       Patients Stated Pain Goal: 5 (12/09/47 6116)  Complications: No complications documented.

## 2020-05-19 NOTE — H&P (Signed)
Nephrolithiasis  HPI: Ms Eggleton is a 70yo here for evaluation of nephrolithiasis. She was last seen in Jan 2020. She has been having intermittent right flank pain for over 6 months. KUB from today shows a 1.5cm right renal calculus and a 1cm left renal calculus. She has been having recurrent UTIs for the past 1 year. She has gotten 6 UTIs in the past year  Her records from [facility] are as follows: I have kidney stones.  HPI: Lela Murfin is a 70 year-old female established patient who is here for renal calculi.  The problem is on both sides. She first stated noticing pain on approximately 03/08/2016. This is not her first kidney stone. Her first stone was approximately 04/09/2011. She has had 1 stones prior to getting this one. She is not currently having flank pain, back pain, groin pain, nausea, vomiting, fever or chills. She has not caught a stone in her urine strainer since her symptoms began.   She has had ureteral stent, ureteroscopy, and percutaneous lithotripsy for treatment of her stones in the past.   05/05/2016: She underwent CT for gross hematuria which revelaed a 1.5cm right upj calculus and a 86mm right lower pole calculus   12/30/2016: Reanl US shows left mid pole calculus which is stable at 13mm. NO stone events since last visit.   06/30/2017: She passed a stone 3 weeks ago on her left side. she denies any flank pain currently. No worsening LUTS. She was treated for a UTI 2 weeks ago.   09/26/2017: No stone events since last visit. Renal US shows bilateral renal calculi. 65mm on right, 6mm on left. She stopped the sodium bicarb   03/28/2018: For over 2 weeks she has had bilateral flank pain. Renal US shows bilateral hydronephrosis and a 1.4cm right UPJ calculus. UA is concerning for infection. CT showed a right renal pelvis calculus.     PMH:     Past Medical History:  Diagnosis Date  . BACK PAIN, THORACIC REGION, RIGHT 01/27/2010   Qualifier: Diagnosis of  By: Moshe Cipro  MD, Joycelyn Schmid    . Callus of foot 07/15/2017  . Essential hypertension   . Headache(784.0)   . Hematuria 02/24/2016  . History of kidney stones   . Hyperlipidemia   . Irregular heart beats   . Microscopic hematuria 02/24/2016  . Neck pain on right side 01/23/2013  . Piles (hemorrhoids) 06/22/2011  . Renal calculus, right 05/31/2016  . Shoulder pain, right 01/25/2014  . SKIN TAG 06/13/2008   Qualifier: Diagnosis of  By: Moshe Cipro MD, Joycelyn Schmid    . TIA (transient ischemic attack) 06/19/2016  . Type 2 diabetes mellitus ([place])   . Vaginitis     Surgical History:      Past Surgical History:  Procedure Laterality Date  . COLONOSCOPY N/A 10/26/2018   Procedure: COLONOSCOPY;  Surgeon: Rogene Houston, MD;  [facility]: [facility];  Service: Endoscopy;  Laterality: N/A;  830-10:30am  . cyst removed Right 1998   cyst- removed from right wrist  . EXTRACORPOREAL SHOCK WAVE LITHOTRIPSY Right 05/29/2018   Procedure: EXTRACORPOREAL SHOCK WAVE LITHOTRIPSY (ESWL);  Surgeon: Kathie Rhodes, MD;  Location: [facility];  Service: Urology;  Laterality: Right;  . lithotrpsy     over 20 years  . NEPHROLITHOTOMY Right 05/31/2016   Procedure: NEPHROLITHOTOMY PERCUTANEOUS WITH SURGEON ACCESS;  Surgeon: Cleon Gustin, MD;  Location: [facility];  Service: Urology;  Laterality: Right;  . POLYPECTOMY  10/26/2018   Procedure: POLYPECTOMY;  Surgeon: Hildred Laser  U, MD;  Location: [facility];  Service: Endoscopy;;  colon  . TOTAL ABDOMINAL HYSTERECTOMY W/ BILATERAL SALPINGOOPHORECTOMY  1998  . TUBAL LIGATION      Home Medications:       Allergies as of 04/10/2020      Reactions   Onglyza [saxagliptin Hydrochloride] Swelling   Tongue swell   Pravastatin Other (See Comments)   Muscle aches   Ace Inhibitors Cough         Medication List       Accurate as of April 10, 2020  2:24 PM. If you have any questions, ask your nurse or doctor.        acetaminophen 500 MG  tablet Commonly known as: TYLENOL Take 1,000 mg by mouth every 6 (six) hours as needed (for headaches/pain.).   aspirin EC 81 MG tablet Take 1 tablet (81 mg total) by mouth daily.   atorvastatin 10 MG tablet Commonly known as: LIPITOR Take one tablet by mouth every Monday, Wednesday and Friday, and take half tablet , every Tuesday, Thursday, Saturday and Sunday   B-D ULTRAFINE III SHORT PEN 31G X 8 MM Misc Generic drug: Insulin Pen Needle FOR USE WITH INSULIN ONCE DAILY DX E11.9   CALCIUM PO Take 1,000 mg by mouth daily.   clotrimazole-betamethasone cream Commonly known as: LOTRISONE Apply 1 application topically 2 (two) times daily. Apply to twice daily to rash under arms, breasts and in groin for 14 days, then as needed   glipiZIDE 10 MG tablet Commonly known as: GLUCOTROL TAKE 1 TABLET BY MOUTH TWICE A DAY BEFORE MEALS   Lantus SoloStar 100 UNIT/ML Solostar Pen Generic drug: insulin glargine INJECT 22 UNITS INTO THE SKIN DAILY AT 10 PM.   losartan 25 MG tablet Commonly known as: COZAAR Take 12.5 mg by mouth daily.   Lubricant Eye Drops 0.4-0.3 % Soln Generic drug: Polyethyl Glycol-Propyl Glycol Place 1 drop into both eyes 3 (three) times daily as needed (dry/irritated eyes.).   metFORMIN 1000 MG tablet Commonly known as: GLUCOPHAGE TAKE 1 TABLET (1,000 MG TOTAL) BY MOUTH 2 (TWO) TIMES DAILY WITH A MEAL.   multivitamin with minerals Tabs tablet Take 1 tablet by mouth daily.   nystatin ointment Commonly known as: MYCOSTATIN Apply 1 application topically 2 (two) times daily. What changed:   when to take this  reasons to take this   OneTouch Delica Lancets 14G Misc Once daily dx e11.9 (give lancects that go with pts meter)   OneTouch Ultra test strip Generic drug: glucose blood USE TO TEST BLOOD SUGAR 3 TIMES A DAY AS DIRECTED DX E11.65   spironolactone 25 MG tablet Commonly known as: ALDACTONE Take 1 tablet (25 mg total) by mouth daily.    Vitamin D3 50 MCG (2000 UT) Tabs Take 2,000 Units by mouth daily.       Allergies:       Allergies  Allergen Reactions  . Onglyza [Saxagliptin Hydrochloride] Swelling    Tongue swell  . Pravastatin Other (See Comments)    Muscle aches  . Ace Inhibitors Cough    Family History:      Family History  Problem Relation Age of Onset  . Alcohol abuse Mother   . Esophageal cancer Father   . Alcohol abuse Father   . Diabetes Sister   . Diabetes Sister   . Hypertension Sister   . Heart attack Sister        in 13's  . Seizures Brother     Social  History:  reports that she has never smoked. She has never used smokeless tobacco. She reports that she does not drink alcohol and does not use drugs.  ROS: All other review of systems were reviewed and are negative except what is noted above in HPI  Physical Exam: BP 115/73   Pulse (!) 108   Temp 98.4 F (36.9 C)   Ht 5\' 4"  (1.626 m)   Wt 134 lb 3.2 oz (60.9 kg)   BMI 23.04 kg/m   Constitutional:  Alert and oriented, No acute distress. HEENT: [place] AT, moist mucus membranes.  Trachea midline, no masses. Cardiovascular: No clubbing, cyanosis, or edema. Respiratory: Normal respiratory effort, no increased work of breathing. GI: Abdomen is soft, nontender, nondistended, no abdominal masses GU: No CVA tenderness.  Lymph: No cervical or inguinal lymphadenopathy. Skin: No rashes, bruises or suspicious lesions. Neurologic: Grossly intact, no focal deficits, moving all 4 extremities. Psychiatric: Normal mood and affect.  Laboratory Data: Recent Labs       Lab Results  Component Value Date   WBC 4.0 02/04/2020   HGB 14.0 02/04/2020   HCT 41.8 02/04/2020   MCV 87 02/04/2020   PLT 439 02/04/2020      Recent Labs       Lab Results  Component Value Date   CREATININE 0.82 02/04/2020      Recent Labs  No results found for: PSA    Recent Labs  No results found for: TESTOSTERONE     Recent Labs  Lab Results  Component Value Date   HGBA1C 7.7 (H) 02/04/2020      Urinalysis Labs (Brief)          Component Value Date/Time   COLORURINE YELLOW 12/28/2019 1909   APPEARANCEUR HAZY (A) 12/28/2019 1909   APPEARANCEUR Hazy (A) 10/15/2019 1106   LABSPEC 1.016 12/28/2019 1909   PHURINE 5.0 12/28/2019 1909   GLUCOSEU NEGATIVE 12/28/2019 1909   HGBUR LARGE (A) 12/28/2019 1909   BILIRUBINUR negative 01/24/2020 0921   BILIRUBINUR Negative 10/15/2019 1106   KETONESUR trace (5) (A) 01/24/2020 0921   KETONESUR NEGATIVE 12/28/2019 1909   PROTEINUR >=300 (A) 12/28/2019 1909   UROBILINOGEN 0.2 01/24/2020 0921   UROBILINOGEN 0.2 01/25/2014 0845   NITRITE Negative 01/24/2020 0921   NITRITE NEGATIVE 12/28/2019 1909   LEUKOCYTESUR Large (3+) (A) 01/24/2020 0921   LEUKOCYTESUR LARGE (A) 12/28/2019 1909      Recent Labs       Lab Results  Component Value Date   LABMICR See below: 10/15/2019   WBCUA >30 (A) 10/15/2019   LABEPIT 0-10 10/15/2019   BACTERIA RARE (A) 12/28/2019      Pertinent Imaging: KUB today: Images reviewed and disucssed with the patient  Results for orders placed during the hospital encounter of 05/29/18  DG Abd 1 View  Narrative CLINICAL DATA:  Renal calculi.  EXAM: ABDOMEN - 1 VIEW  COMPARISON:  Radiograph of May 02, 2018. CT scan of March 28, 2018.  FINDINGS: The bowel gas pattern is normal. Stable large right renal calculus is noted.  IMPRESSION: Stable large right renal calculus is noted. No evidence of bowel obstruction or ileus.   Electronically Signed By: Marijo Conception, M.D. On: 05/29/2018 07:48  No results found for this or any previous visit.  No results found for this or any previous visit.  No results found for this or any previous visit.  Results for orders placed during the hospital encounter of 02/14/20  US RENAL  Narrative CLINICAL  DATA:  Initial  evaluation for stage 2 chronic kidney disease.  EXAM: RENAL / URINARY TRACT ULTRASOUND COMPLETE  COMPARISON:  Prior CT from 04/20/2016.  FINDINGS: Right Kidney:  Renal measurements: 11.2 x 4.9 x 5.7 cm = volume: 162.3 mL. Renal echogenicity within normal limits. 1.5 cm stone seen positioned at the renal pelvis. Mild caliectasis seen proximally at the mid and upper pole. No frank hydronephrosis. No focal renal mass.  Left Kidney:  Renal measurements: 11.7 x 6.4 x 5.1 cm = volume: 199.0 mL. Renal echogenicity within normal limits. 1.3 cm nonobstructive stone present at the lower pole. Minimal caliectasis without hydronephrosis. No focal renal mass.  Bladder:  Appears normal for degree of bladder distention.  Other:  None.  IMPRESSION: 1. 1.5 cm stone positioned at the right renal pelvis. Secondary mild caliectasis at the mid and upper right kidney without frank hydronephrosis. 2. 1.3 cm nonobstructive stone at the lower pole of the left kidney. 3. Renal echogenicity within normal limits.   Electronically Signed By: Jeannine Boga M.D. On: 02/14/2020 22:38  No results found for this or any previous visit.  No results found for this or any previous visit.  No results found for this or any previous visit.   Assessment & Plan:    1. Nephrolithiasis -We discussed the management of kidney stones. These options include observation, ureteroscopy, shockwave lithotripsy (ESWL) and percutaneous nephrolithotomy (PCNL). We discussed which options are relevant to the patient's stone(s). We discussed the natural history of kidney stones as well as the complications of untreated stones and the impact on quality of life without treatment as well as with each of the above listed treatments. We also discussed the efficacy of each treatment in its ability to clear the stone burden. With any of these management options I discussed the signs and symptoms of  infection and the need for emergent treatment should these be experienced. For each option we discussed the ability of each procedure to clear the patient of their stone burden.   For observation I described the risks which include but are not limited to silent renal damage, life-threatening infection, need for emergent surgery, failure to pass stone and pain.   For ureteroscopy I described the risks which include bleeding, infection, damage to contiguous structures, positioning injury, ureteral stricture, ureteral avulsion, ureteral injury, need for prolonged ureteral stent, inability to perform ureteroscopy, need for an interval procedure, inability to clear stone burden, stent discomfort/pain, heart attack, stroke, pulmonary embolus and the inherent risks with general anesthesia.   For shockwave lithotripsy I described the risks which include arrhythmia, kidney contusion, kidney hemorrhage, need for transfusion, pain, inability to adequately break up stone, inability to pass stone fragments, Steinstrasse, infection associated with obstructing stones, need for alternate surgical procedure, need for repeat shockwave lithotripsy, MI, CVA, PE and the inherent risks with anesthesia/conscious sedation.   For PCNL I described the risks including positioning injury, pneumothorax, hydrothorax, need for chest tube, inability to clear stone burden, renal laceration, arterial venous fistula or malformation, need for embolization of kidney, loss of kidney or renal function, need for repeat procedure, need for prolonged nephrostomy tube, ureteral avulsion, MI, CVA, PE and the inherent risks of general anesthesia.   - The patient would like to proceed with right ureteroscopic stone extraction - Urinalysis, Routine w reflex microscopic - Urinalysis, Routine w reflex microscopic    No follow-ups on file.  [name], MD  [facility] [place]

## 2020-05-19 NOTE — Discharge Instructions (Signed)
Ureteral Stent Implantation, Care After This sheet gives you information about how to care for yourself after your procedure. Your health care provider may also give you more specific instructions. If you have problems or questions, contact your health care provider. What can I expect after the procedure? After the procedure, it is common to have:  Nausea.  Mild pain when you urinate. You may feel this pain in your lower back or lower abdomen. The pain should stop within a few minutes after you urinate. This may last for up to 1 week.  A small amount of blood in your urine for several days. Follow these instructions at home: Medicines  Take over-the-counter and prescription medicines only as told by your health care provider.  If you were prescribed an antibiotic medicine, take it as told by your health care provider. Do not stop taking the antibiotic even if you start to feel better.  Do not drive for 24 hours if you were given a sedative during your procedure.  Ask your health care provider if the medicine prescribed to you requires you to avoid driving or using heavy machinery. Activity  Rest as told by your health care provider.  Avoid sitting for a long time without moving. Get up to take short walks every 1-2 hours. This is important to improve blood flow and breathing. Ask for help if you feel weak or unsteady.  Return to your normal activities as told by your health care provider. Ask your health care provider what activities are safe for you. General instructions  Watch for any blood in your urine. Call your health care provider if the amount of blood in your urine increases.  If you have a catheter: ? Follow instructions from your health care provider about taking care of your catheter and collection bag. ? Do not take baths, swim, or use a hot tub until your health care provider approves. Ask your health care provider if you may take showers. You may only be allowed to  take sponge baths.  Drink enough fluid to keep your urine pale yellow.  Do not use any products that contain nicotine or tobacco, such as cigarettes, e-cigarettes, and chewing tobacco. These can delay healing after surgery. If you need help quitting, ask your health care provider.  Keep all follow-up visits as told by your health care provider. This is important.   Contact a health care provider if:  You have pain that gets worse or does not get better with medicine, especially pain when you urinate.  You have difficulty urinating.  You feel nauseous or you vomit repeatedly during a period of more than 2 days after the procedure. Get help right away if:  Your urine is dark red or has blood clots in it.  You are leaking urine (have incontinence).  The end of the stent comes out of your urethra.  You cannot urinate.  You have sudden, sharp, or severe pain in your abdomen or lower back.  You have a fever.  You have swelling or pain in your legs.  You have difficulty breathing. Summary  After the procedure, it is common to have mild pain when you urinate that goes away within a few minutes after you urinate. This may last for up to 1 week.  Watch for any blood in your urine. Call your health care provider if the amount of blood in your urine increases.  Take over-the-counter and prescription medicines only as told by your health care provider.  Drink enough fluid to keep your urine pale yellow. This information is not intended to replace advice given to you by your health care provider. Make sure you discuss any questions you have with your health care provider. Document Revised: 11/29/2017 Document Reviewed: 11/30/2017 Elsevier Patient Education  2021 Elsevier Inc.  

## 2020-05-19 NOTE — Anesthesia Postprocedure Evaluation (Signed)
Anesthesia Post Note  Patient: [name]  Procedure(s) Performed: CYSTOSCOPY WITH RETROGRADE PYELOGRAM, URETEROSCOPY AND STENT PLACEMENT (Right Bladder)  Patient location during evaluation: PACU Anesthesia Type: General Level of consciousness: awake and alert, oriented and patient cooperative Pain management: pain level controlled Vital Signs Assessment: post-procedure vital signs reviewed and stable Respiratory status: spontaneous breathing, nonlabored ventilation and respiratory function stable Cardiovascular status: blood pressure returned to baseline and stable Postop Assessment: no headache, no backache, no apparent nausea or vomiting and adequate PO intake Anesthetic complications: no   No complications documented.   Last Vitals:  Vitals:   05/19/20 0658  BP: (!) 147/78  Pulse: (!) 106  Resp: 20  Temp: 37 C  SpO2: 99%    Last Pain:  Vitals:   05/19/20 0658  TempSrc: Oral  PainSc: 2                  Mailin Coglianese

## 2020-05-19 NOTE — Op Note (Signed)
Preoperative diagnosis: Right renal calculus  Postoperative diagnosis: right UPJ calculus, pyruia  Procedure: 1 cystoscopy 2  Bilateral retrograde pyelography 3.  Intraoperative fluoroscopy, under one hour, with interpretation 4.  Right 6 x 26 JJ stent placement  Attending: Rosie Fate  Anesthesia: General  Estimated blood loss: None  Drains: Right 6 x 26 JJ ureteral stent without tether  Specimens: none  Antibiotics: ancef  Findings: Right UPJ calculus with mild proximal hydronephrosis. Purulent drainage from righh collecting system with wire placement and then stent placement. No hydronephrosis int he left collecting system. Left lower pole calculus.  Indications: Patient is a 70 year old female with a history of right renal stone and which has been growing in size.  After discussing treatment options, she decided proceed with right ureteroscopic stone manipulation.  Procedure her in detail: The patient was brought to the operating room and a brief timeout was done to ensure correct patient, correct procedure, correct site.  General anesthesia was administered patient was placed in dorsal lithotomy position.  Her genitalia was then prepped and draped in usual sterile fashion.  A rigid 41 French cystoscope was passed in the urethra and the bladder.  Bladder was inspected free masses or lesions.  the right ureteral orifices were in the normal orthotopic locations.  a 6 french ureteral catheter was then instilled into the right ureter orifice.  a gentle retrograde was obtained and findings noted above.  we then placed a zip wire through the ureteral catheter and advanced up to the renal pelvis.  At that time purulent drainage was noted from the right collecting system and at that time we elected to place a stent we then placed a 6 x 26 double-j ureteral stent over the original zip wire. The wire was removed and good coil was noted in the upper pole under fluoroscopy and the bladder  under direct vision. We then turned out attention to the left side. a 6 french ureteral catheter was then instilled into the right ureter orifice.  a gentle retrograde was obtained and findings noted above.    the bladder was then drained and this concluded the procedure which was well tolerated by patient.  Complications: None  Condition: Stable, extubated, transferred to PACU  Plan: Patient is to be discharged home as to follow-up in 2-3 week for right ureteroscopic stone extraction

## 2020-05-19 NOTE — Anesthesia Preprocedure Evaluation (Signed)
Anesthesia Evaluation  Patient identified by MRN, date of birth, ID band Patient awake    Reviewed: Allergy & Precautions, NPO status , Patient's Chart, lab work & pertinent test results  History of Anesthesia Complications Negative for: history of anesthetic complications  Airway Mallampati: II  TM Distance: >3 FB Neck ROM: Full    Dental  (+) Dental Advisory Given, Loose,    Pulmonary    Pulmonary exam normal breath sounds clear to auscultation       Cardiovascular Exercise Tolerance: Good hypertension, Pt. on medications Normal cardiovascular exam Rhythm:Regular Rate:Normal     Neuro/Psych  Headaches, TIA   GI/Hepatic negative GI ROS, Neg liver ROS,   Endo/Other  diabetes, Well Controlled, Type 2, Insulin Dependent, Oral Hypoglycemic Agents  Renal/GU Renal disease     Musculoskeletal negative musculoskeletal ROS (+)   Abdominal   Peds  Hematology   Anesthesia Other Findings   Reproductive/Obstetrics negative OB ROS                            Anesthesia Physical Anesthesia Plan  ASA: III  Anesthesia Plan: General   Post-op Pain Management:    Induction: Intravenous  PONV Risk Score and Plan: Ondansetron  Airway Management Planned: LMA and Oral ETT  Additional Equipment:   Intra-op Plan:   Post-operative Plan: Extubation in OR  Informed Consent: I have reviewed the patients History and Physical, chart, labs and discussed the procedure including the risks, benefits and alternatives for the proposed anesthesia with the patient or authorized representative who has indicated his/her understanding and acceptance.     Dental advisory given  Plan Discussed with: CRNA and Surgeon  Anesthesia Plan Comments:         Anesthesia Quick Evaluation

## 2020-05-20 ENCOUNTER — Encounter (HOSPITAL_COMMUNITY): Payer: Self-pay | Admitting: Urology

## 2020-05-26 ENCOUNTER — Telehealth: Payer: Self-pay

## 2020-05-26 NOTE — Telephone Encounter (Signed)
This patient is still being billed for the [phone] and is now in collections.  Dr Moshe Cipro said this was billed in error and the patient should not be billed [phone].  Please advise.  Can you correct it with a NC1 ?  From: [email or web address] [email or web address]  Sent: Wednesday, January 16, 2020 1:59 PM To: Rhae [email or web address] Subject: RE: "secure" Khaniyah Bezek MRN [id number]  [facility] Rosaria Ferries,  You have a little more going on with this one than that preventive visit.  I am not going to change the preventive to another office visit.  You already have two office visits billed.   [id number] new charge posted 11/09 [phone] we got payment on  [phone] was documented at time of service- note was changed after billing occurred cannot change code due to updated documentation occurring after billing.   Sorry I couldn't help on this one.     Graceann Congress MBA, [facility], [place], [place]  SW-Professional Counsellor  From: Rhae [email or web address]  Sent: Tuesday, January 15, 2020 3:30 PM To: [email or web address] Subject: "secure" Kaysi Ourada MRN [id number]  This patient was charged a Physical in error per Dr Moshe Cipro.  Please rebill the OV 12/04/19 as [id number].  If I have sent this to you in error please let me know who I should send it to.  Thanks for your help.   Fayrene Helper, MD  You 1 hour ago (1:21 PM)     Expand AllCollapse All   Rebilled as 20 minute visit [phone]            Documentation     You routed conversation to Fayrene Helper, MD Yesterday (10:07 AM)    Dennis Bast Yesterday (10:07 AM)             Expand AllCollapse All   Patient called and said she was billed for a Physical on 9/28 and the insurance denied it since she had already had a physical this year on 07/03/19.  Please advise.         Delmar Attaway Administrator [phone]

## 2020-05-27 ENCOUNTER — Ambulatory Visit: Payer: Medicare Other | Admitting: General Surgery

## 2020-05-27 ENCOUNTER — Telehealth: Payer: Self-pay | Admitting: Urology

## 2020-05-27 NOTE — Telephone Encounter (Signed)
Patient called back discussed surgery date as being first available. Patient agreed to surgery date of April 21st.

## 2020-05-27 NOTE — Telephone Encounter (Signed)
Patient requesting a call back to discuss next steps.  She does not want to keep the stent in until 06/26/20.  She is requesting a call back ASAP.

## 2020-05-28 ENCOUNTER — Other Ambulatory Visit: Payer: Medicare Other | Admitting: Urology

## 2020-06-05 ENCOUNTER — Other Ambulatory Visit: Payer: Self-pay

## 2020-06-05 ENCOUNTER — Encounter: Payer: Self-pay | Admitting: Nutrition

## 2020-06-05 ENCOUNTER — Encounter: Payer: Medicare Other | Attending: Family Medicine | Admitting: Nutrition

## 2020-06-05 VITALS — Ht 64.0 in | Wt 130.0 lb

## 2020-06-05 DIAGNOSIS — E782 Mixed hyperlipidemia: Secondary | ICD-10-CM | POA: Insufficient documentation

## 2020-06-05 DIAGNOSIS — I10 Essential (primary) hypertension: Secondary | ICD-10-CM | POA: Insufficient documentation

## 2020-06-05 DIAGNOSIS — Z794 Long term (current) use of insulin: Secondary | ICD-10-CM | POA: Diagnosis present

## 2020-06-05 DIAGNOSIS — E1169 Type 2 diabetes mellitus with other specified complication: Secondary | ICD-10-CM | POA: Diagnosis present

## 2020-06-05 NOTE — Patient Instructions (Signed)
Goals Established by Pt . Follow My Plate . Eat meals on time . Eat 30-45 g CHO at meals. . Be cautious of high phosphorus foods. . Drink 100 oz of water per day . Don't skip meals . Take Lantus at 9 pm at night. . Test blood sugars before breakfast and before taking Lantus at 9 pm. .

## 2020-06-05 NOTE — Progress Notes (Signed)
Medical Nutrition Therapy  Appointment Start time:  0277  Appointment End time:  4128  Primary concerns today: Diabetes Type 2 DM  Recently had kidney stones. Referral diagnosis: E11.8, CKD Preferred learning style:  no preference indicated Learning readiness:  change in progress A1C 7.1%. Meds: 22 units of Lantus, Metformin 1000 mg BID.   NUTRITION ASSESSMENT   Anthropometrics  Wt Readings from Last 3 Encounters:  06/05/20 130 lb (59 kg)  05/15/20 140 lb (63.5 kg)  04/22/20 136 lb (61.7 kg)   Ht Readings from Last 3 Encounters:  06/05/20 5\' 4"  (1.626 m)  05/15/20 5\' 4"  (1.626 m)  04/22/20 5\' 4"  (1.626 m)   Body mass index is 22.31 kg/m. @BMIFA @ Facility age limit for growth percentiles is 20 years. Facility age limit for growth percentiles is 20 years.  Lab Results  Component Value Date   HGBA1C 7.1 (H) 05/15/2020   Recently had some surgery for kidney stones and will get another procedure done next month.  Clinical Medical Hx: Dm Type 2. Medications: See Chart Lantus 22 units, Metformin 1000 mg BID. Labs:   BS right now are in the 200's post her procedures and inconsistent eating habits not knowing what to eat. Lost 4-10 lbs. Lab Results  Component Value Date   HGBA1C 7.1 (H) 05/15/2020   CMP Latest Ref Rng & Units 05/15/2020 02/04/2020 06/29/2019  Glucose 70 - 99 mg/dL 285(H) 139(H) 126(H)  BUN 8 - 23 mg/dL 18 16 14   Creatinine 0.44 - 1.00 mg/dL 0.85 0.82 0.56(L)  Sodium 135 - 145 mmol/L 136 140 142  Potassium 3.5 - 5.1 mmol/L 3.6 4.4 3.9  Chloride 98 - 111 mmol/L 98 98 102  CO2 22 - 32 mmol/L 25 25 28   Calcium 8.9 - 10.3 mg/dL 9.7 10.4(H) 9.9  Total Protein 6.0 - 8.5 g/dL - 7.8 7.4  Total Bilirubin 0.0 - 1.2 mg/dL - 0.6 0.5  Alkaline Phos 44 - 121 IU/L - 133(H) -  AST 0 - 40 IU/L - 10 12  ALT 0 - 32 IU/L - 8 13   Lipid Panel     Component Value Date/Time   CHOL 203 (H) 02/04/2020 0806   TRIG 78 02/04/2020 0806   HDL 81 02/04/2020 0806   CHOLHDL 2.5  02/04/2020 0806   CHOLHDL 2.5 06/29/2019 1303   VLDL 10 09/30/2016 0917   LDLCALC 108 (H) 02/04/2020 0806   LDLCALC 116 (H) 06/29/2019 1303   LABVLDL 14 02/04/2020 0806    Notable Signs/Symptoms: Lost 6 lbs. Had kidney stones and is having lithotripsy    Lifestyle & Dietary Hx Lives by herself.   Estimated daily fluid intake: 24-32 oz Supplements:  Sleep: 8 hrs Stress / self-care: none Current average weekly physical activity: ADL   24-Hr Dietary Recall Has been eating 1- 2 times per day. Has been afraid to eat because she didn't know what she should eat.   Estimated Energy Needs Calories: 1600 Carbohydrate: 180g Protein: 120g Fat: 44g   NUTRITION DIAGNOSIS  NB-1.1 Food and nutrition-related knowledge deficit As related to DIabetes Type 2.  As evidenced by A1C 7.7%.   NUTRITION INTERVENTION  Nutrition education (E-1) on the following topics:  . Nutrition and Diabetes education provided on My Plate, CHO counting, meal planning, portion sizes, timing of meals, avoiding snacks between meals unless having a low blood sugar, target ranges for A1C and blood sugars, signs/symptoms and treatment of hyper/hypoglycemia, monitoring blood sugars, taking medications as prescribed, benefits of exercising 30 minutes  per day and prevention of complications of DM. Marland Kitchen   Handouts Provided Include   The Plate Method  Meal Plan Card  Diabetes Instructions.   Learning Style & Readiness for Change Teaching method utilized: Visual & Auditory  Demonstrated degree of understanding via: Teach Back  Barriers to learning/adherence to lifestyle change: none Goals Established by Pt . Follow My Plate . Eat meals on time . Eat 30-45 g CHO at meals. . Be cautious of high phosphorus foods. . Drink 100 oz of water per day . Don't skip meals . Take Lantus at 9 pm at night. . Test blood sugars before breakfast and before taking Lantus at 9 pm. .    MONITORING & EVALUATION Dietary intake,  weekly physical activity, and blood sugar readings  in 2 months.  Next Steps  Patient is to start eating meals on time and testing blood sugars twice a day.

## 2020-06-10 ENCOUNTER — Telehealth: Payer: Self-pay

## 2020-06-10 NOTE — Telephone Encounter (Signed)
Pt called asking if anything else needs to be done before her surgery. Spoke with Orinda Kenner. RN. Told pt if she feels she has an infection before surgery to call and will put on schedule for urine drop off.

## 2020-06-13 ENCOUNTER — Other Ambulatory Visit: Payer: Self-pay

## 2020-06-13 DIAGNOSIS — N200 Calculus of kidney: Secondary | ICD-10-CM

## 2020-06-13 MED ORDER — TRIMETHOPRIM 100 MG PO TABS: 100.0000 mg | ORAL_TABLET | Freq: Every day | ORAL | 0 refills | Status: DC

## 2020-06-13 NOTE — Progress Notes (Signed)
Patient called and notified of trimethoprim refill to [facility].

## 2020-06-16 NOTE — Patient Instructions (Signed)
[name]  06/16/2020     @PREFPERIOPPHARMACY @   Your procedure is scheduled on  06/26/2020.   Report to [facility] at  0800  A.M.   Call this number if you have problems the morning of surgery:  [phone]   Remember:  Do not eat or drink after midnight.                       Take these medicines the morning of surgery with A SIP OF WATER  zofran (if needed), pxycodone (if needed).  Take 11 units of your glargine insulin the night before your procedure.  DO NOT take any medications for diabetes the morning of your procedure.   If your glucose is 70 or below the morning of your procedure, drink 1/2 cup of clear juice and recheck your glucose in 15 minutes. If your glucose is still 70 or below, call [phone] for instructions.  If your glucose is 300 or above the morning of your procedure, call [phone] for instructions.      Do not wear jewelry, make-up or nail polish.  Do not wear lotions, powders, or perfumes, or deodorant.  Do not shave 48 hours prior to surgery.  Men may shave face and neck.  Do not bring valuables to the hospital.  [facility] is not responsible for any belongings or valuables.   Place clean sheets on your bed the night before your procedure and DO NOT sleep with pets this night.  Shower with CHG the night before and the morning of your procedure. DO NOT use CHG on your face, hair or genitals.  After each shower, dry off with a clean towel, put on clean, comfortable clothes and brush your teeth.     Contacts, dentures or bridgework may not be worn into surgery.  Leave your suitcase in the car.  After surgery it may be brought to your room.  For patients admitted to the hospital, discharge time will be determined by your treatment team.  Patients discharged the day of surgery will not be allowed to drive home and must have someone with them for 24 hours.   Special instructions:  DO NOT smoke tobacco or vape for 24  hours before your procedure.    Please read over the following fact sheets that you were given. Coughing and Deep Breathing, Surgical Site Infection Prevention, Anesthesia Post-op Instructions and Care and Recovery After Surgery       Ureteral Stent Implantation, Care After This sheet gives you information about how to care for yourself after your procedure. Your health care provider may also give you more specific instructions. If you have problems or questions, contact your health care provider. What can I expect after the procedure? After the procedure, it is common to have:  Nausea.  Mild pain when you urinate. You may feel this pain in your lower back or lower abdomen. The pain should stop within a few minutes after you urinate. This may last for up to 1 week.  A small amount of blood in your urine for several days. Follow these instructions at home: Medicines  Take over-the-counter and prescription medicines only as told by your health care provider.  If you were prescribed an antibiotic medicine, take it as told by your health care provider. Do not stop taking the antibiotic even if you start to feel better.  Do not drive for 24 hours if you were given a sedative  during your procedure.  Ask your health care provider if the medicine prescribed to you requires you to avoid driving or using heavy machinery. Activity  Rest as told by your health care provider.  Avoid sitting for a long time without moving. Get up to take short walks every 1-2 hours. This is important to improve blood flow and breathing. Ask for help if you feel weak or unsteady.  Return to your normal activities as told by your health care provider. Ask your health care provider what activities are safe for you. General instructions  Watch for any blood in your urine. Call your health care provider if the amount of blood in your urine increases.  If you have a catheter: ? Follow instructions from your  health care provider about taking care of your catheter and collection bag. ? Do not take baths, swim, or use a hot tub until your health care provider approves. Ask your health care provider if you may take showers. You may only be allowed to take sponge baths.  Drink enough fluid to keep your urine pale yellow.  Do not use any products that contain nicotine or tobacco, such as cigarettes, e-cigarettes, and chewing tobacco. These can delay healing after surgery. If you need help quitting, ask your health care provider.  Keep all follow-up visits as told by your health care provider. This is important.   Contact a health care provider if:  You have pain that gets worse or does not get better with medicine, especially pain when you urinate.  You have difficulty urinating.  You feel nauseous or you vomit repeatedly during a period of more than 2 days after the procedure. Get help right away if:  Your urine is dark red or has blood clots in it.  You are leaking urine (have incontinence).  The end of the stent comes out of your urethra.  You cannot urinate.  You have sudden, sharp, or severe pain in your abdomen or lower back.  You have a fever.  You have swelling or pain in your legs.  You have difficulty breathing. Summary  After the procedure, it is common to have mild pain when you urinate that goes away within a few minutes after you urinate. This may last for up to 1 week.  Watch for any blood in your urine. Call your health care provider if the amount of blood in your urine increases.  Take over-the-counter and prescription medicines only as told by your health care provider.  Drink enough fluid to keep your urine pale yellow. This information is not intended to replace advice given to you by your health care provider. Make sure you discuss any questions you have with your health care provider. Document Revised: 11/29/2017 Document Reviewed: 11/30/2017 Elsevier Patient  Education  2021 [place] Anesthesia, Adult, Care After This sheet gives you information about how to care for yourself after your procedure. Your health care provider may also give you more specific instructions. If you have problems or questions, contact your health care provider. What can I expect after the procedure? After the procedure, the following side effects are common:  Pain or discomfort at the IV site.  Nausea.  Vomiting.  Sore throat.  Trouble concentrating.  Feeling cold or chills.  Feeling weak or tired.  Sleepiness and fatigue.  Soreness and body aches. These side effects can affect parts of the body that were not involved in surgery. Follow these instructions at home: For the time period  you were told by your health care provider:  Rest.  Do not participate in activities where you could fall or become injured.  Do not drive or use machinery.  Do not drink alcohol.  Do not take sleeping pills or medicines that cause drowsiness.  Do not make important decisions or sign legal documents.  Do not take care of children on your own.   Eating and drinking  Follow any instructions from your health care provider about eating or drinking restrictions.  When you feel hungry, start by eating small amounts of foods that are soft and easy to digest (bland), such as toast. Gradually return to your regular diet.  Drink enough fluid to keep your urine pale yellow.  If you vomit, rehydrate by drinking water, juice, or clear broth. General instructions  If you have sleep apnea, surgery and certain medicines can increase your risk for breathing problems. Follow instructions from your health care provider about wearing your sleep device: ? Anytime you are sleeping, including during daytime naps. ? While taking prescription pain medicines, sleeping medicines, or medicines that make you drowsy.  Have a responsible adult stay with you for the time you are  told. It is important to have someone help care for you until you are awake and alert.  Return to your normal activities as told by your health care provider. Ask your health care provider what activities are safe for you.  Take over-the-counter and prescription medicines only as told by your health care provider.  If you smoke, do not smoke without supervision.  Keep all follow-up visits as told by your health care provider. This is important. Contact a health care provider if:  You have nausea or vomiting that does not get better with medicine.  You cannot eat or drink without vomiting.  You have pain that does not get better with medicine.  You are unable to pass urine.  You develop a skin rash.  You have a fever.  You have redness around your IV site that gets worse. Get help right away if:  You have difficulty breathing.  You have chest pain.  You have blood in your urine or stool, or you vomit blood. Summary  After the procedure, it is common to have a sore throat or nausea. It is also common to feel tired.  Have a responsible adult stay with you for the time you are told. It is important to have someone help care for you until you are awake and alert.  When you feel hungry, start by eating small amounts of foods that are soft and easy to digest (bland), such as toast. Gradually return to your regular diet.  Drink enough fluid to keep your urine pale yellow.  Return to your normal activities as told by your health care provider. Ask your health care provider what activities are safe for you. This information is not intended to replace advice given to you by your health care provider. Make sure you discuss any questions you have with your health care provider. Document Revised: 11/08/2019 Document Reviewed: 06/07/2019 Elsevier Patient Education  2021 [employer].

## 2020-06-17 ENCOUNTER — Encounter: Payer: Self-pay | Admitting: Family Medicine

## 2020-06-17 ENCOUNTER — Other Ambulatory Visit: Payer: Self-pay

## 2020-06-17 ENCOUNTER — Ambulatory Visit (INDEPENDENT_AMBULATORY_CARE_PROVIDER_SITE_OTHER): Payer: Medicare Other | Admitting: Family Medicine

## 2020-06-17 VITALS — BP 120/70 | HR 107 | Resp 15 | Ht 64.0 in | Wt 129.4 lb

## 2020-06-17 DIAGNOSIS — I10 Essential (primary) hypertension: Secondary | ICD-10-CM

## 2020-06-17 DIAGNOSIS — Z0000 Encounter for general adult medical examination without abnormal findings: Secondary | ICD-10-CM

## 2020-06-17 DIAGNOSIS — B369 Superficial mycosis, unspecified: Secondary | ICD-10-CM

## 2020-06-17 DIAGNOSIS — Z794 Long term (current) use of insulin: Secondary | ICD-10-CM | POA: Diagnosis not present

## 2020-06-17 DIAGNOSIS — Z1231 Encounter for screening mammogram for malignant neoplasm of breast: Secondary | ICD-10-CM

## 2020-06-17 DIAGNOSIS — E782 Mixed hyperlipidemia: Secondary | ICD-10-CM

## 2020-06-17 DIAGNOSIS — L819 Disorder of pigmentation, unspecified: Secondary | ICD-10-CM | POA: Diagnosis not present

## 2020-06-17 DIAGNOSIS — E1169 Type 2 diabetes mellitus with other specified complication: Secondary | ICD-10-CM | POA: Diagnosis not present

## 2020-06-17 MED ORDER — HYDROQUINONE 4 % EX CREA: TOPICAL_CREAM | Freq: Two times a day (BID) | CUTANEOUS | 0 refills | Status: DC

## 2020-06-17 MED ORDER — CLOTRIMAZOLE-BETAMETHASONE 1-0.05 % EX CREA: 1.0000 "application " | TOPICAL_CREAM | Freq: Two times a day (BID) | CUTANEOUS | 1 refills | Status: DC

## 2020-06-17 NOTE — Assessment & Plan Note (Signed)
Hydroquinone topicall to back as needed

## 2020-06-17 NOTE — Assessment & Plan Note (Signed)
Clotrimazole/ betameth topically as needed

## 2020-06-17 NOTE — Patient Instructions (Addendum)
F?u in office with MD in end August, call if you need me before  Please schedule mammogram at checkout, past due, [facility]  Fasting lipid, cmp and EGFr, last week in May  Non fasting HBA1C and chem 7 and EGFR 5 days before August apppointment  [facility])  Glipizide discontinued  Bleaching cream is [facility] ([facility])  Good foot exam  Please schedule your eye exam  It is important that you exercise regularly at least 30 minutes 5 times a week. If you develop chest pain, have severe difficulty breathing, or feel very tired, stop exercising immediately and seek medical attention  Think about what you will eat, plan ahead. Choose " clean, green, fresh or frozen" over canned, processed or packaged foods which are more sugary, salty and fatty. 70 to 75% of food eaten should be vegetables and fruit. Three meals at set times with snacks allowed between meals, but they must be fruit or vegetables. Aim to eat over a 12 hour period , example 7 am to 7 pm, and STOP after  your last meal of the day. Drink water,generally about 64 ounces per day, no other drink is as healthy. Fruit juice is best enjoyed in a healthy way, by EATING the fruit. Thanks for choosing [facility], we consider it a privelige to serve you.

## 2020-06-17 NOTE — Progress Notes (Signed)
[name]     MRN: [id number]      DOB: 1951/02/22  HPI: Patient is in for annual physical exam. STILL HAS RIGHT KIDNEY STONES WITH STENT IN PLACE UNCOMFORTABLE AND DISTRESSED. C/o darkening of scan over scar on back where cyst removed, wantsmedication, also for recurrent fungal infection under left breast Recent labs,  are reviewed. Immunization is reviewed , and  updated if needed.   PE: BP 120/70   Pulse (!) 107   Resp 15   Ht 5\' 4"  (1.626 m)   Wt 129 lb 6.4 oz (58.7 kg)   SpO2 97%   BMI 22.21 kg/m   Pleasant  female, alert and oriented x 3, in no cardio-pulmonary distress. Afebrile. HEENT No facial trauma or asymetry. Sinuses non tender.  Extra occullar muscles intact.. External ears normal, . Neck: supple, no adenopathy,JVD or thyromegaly.No bruits.  Chest: Clear to ascultation bilaterally.No crackles or wheezes. Non tender to palpation  Breast: NOT EXAMINED, ASYMPTOMARTIC, MAMMOGRAM SCHEDULED, PAST DUE  Cardiovascular system; Heart sounds normal,  S1 and  S2 ,no S3.  No murmur, or thrill.  Peripheral pulses normal.  Abdomen: Soft, lower abdominal tenderness Bowel sounds normal. No guarding, or rebound.     Musculoskeletal exam: Full ROM of spine, hips , shoulders and knees. No deformity ,swelling or crepitus noted. No muscle wasting or atrophy.   Neurologic: Cranial nerves 2 to 12 intact. Power, tone ,sensation  normal throughout. No disturbance in gait. No tremor.  Skin: Intact, no ulceration, erythema , scaling or rash noted. Hyperpigmentation in area of scar, intermittent fungal infection under left breast  Psych; Normal mood and affect. Judgement and concentration normal   Assessment & Plan:  Annual physical exam Annual exam as documented. Counseling done  re healthy lifestyle involving commitment to 150 minutes exercise per week, heart healthy diet, and attaining healthy weight.The importance of adequate sleep also  discussed. Regular seat belt use and home safety, is also discussed. Changes in health habits are decided on by the patient with goals and time frames  set for achieving them. Immunization and cancer screening needs are specifically addressed at this visit.   Type 2 diabetes mellitus with other specified complication (HCC) Controlled, no change in medication [name] is reminded of the importance of commitment to daily physical activity for 30 minutes or more, as able and the need to limit carbohydrate intake to 30 to 60 grams per meal to help with blood sugar control.   The need to take medication as prescribed, test blood sugar as directed, and to call between visits if there is a concern that blood sugar is uncontrolled is also discussed.   [name] is reminded of the importance of daily foot exam, annual eye examination, and good blood sugar, blood pressure and cholesterol control.  Diabetic Labs Latest Ref Rng & Units 05/15/2020 02/04/2020 12/04/2019 10/18/2019 06/29/2019  HbA1c 4.8 - 5.6 % 7.1(H) 7.7(H) - 7.9(A) 8.0(H)  Microalbumin Not Estab. ug/mL - - 304.0(H) - -  Micro/Creat Ratio <30 mcg/mg creat - - - - -  Chol 100 - 199 mg/dL - 203(H) - - 217(H)  HDL >39 mg/dL - 81 - - 86  Calc LDL 0 - 99 mg/dL - 108(H) - - 116(H)  Triglycerides 0 - 149 mg/dL - 78 - - 59  Creatinine 0.44 - 1.00 mg/dL 0.85 0.82 - - 0.56(L)   BP/Weight 06/17/2020 06/05/2020 05/19/2020 05/15/2020 04/22/2020 03/11/863 09/13/4694  Systolic BP 295 - 284 132 121 115  852  Diastolic BP 70 - 77 96 77 73 82  Wt. (Lbs) 129.4 130 - 140 136 134.2 137  BMI 22.21 22.31 - 24.03 23.34 23.04 23.52   Foot/eye exam completion dates Latest Ref Rng & Units 06/17/2020 10/16/2019  Eye Exam No Retinopathy - No Retinopathy  Foot exam Order - - -  Foot Form Completion - Done -        Hyperpigmentation Hydroquinone topicall to back as needed  Dermatomycosis Clotrimazole/ betameth topically as needed

## 2020-06-17 NOTE — Assessment & Plan Note (Signed)

## 2020-06-17 NOTE — Assessment & Plan Note (Signed)
Controlled, no change in medication [name] is reminded of the importance of commitment to daily physical activity for 30 minutes or more, as able and the need to limit carbohydrate intake to 30 to 60 grams per meal to help with blood sugar control.   The need to take medication as prescribed, test blood sugar as directed, and to call between visits if there is a concern that blood sugar is uncontrolled is also discussed.   [name] is reminded of the importance of daily foot exam, annual eye examination, and good blood sugar, blood pressure and cholesterol control.  Diabetic Labs Latest Ref Rng & Units 05/15/2020 02/04/2020 12/04/2019 10/18/2019 06/29/2019  HbA1c 4.8 - 5.6 % 7.1(H) 7.7(H) - 7.9(A) 8.0(H)  Microalbumin Not Estab. ug/mL - - 304.0(H) - -  Micro/Creat Ratio <30 mcg/mg creat - - - - -  Chol 100 - 199 mg/dL - 203(H) - - 217(H)  HDL >39 mg/dL - 81 - - 86  Calc LDL 0 - 99 mg/dL - 108(H) - - 116(H)  Triglycerides 0 - 149 mg/dL - 78 - - 59  Creatinine 0.44 - 1.00 mg/dL 0.85 0.82 - - 0.56(L)   BP/Weight 06/17/2020 06/05/2020 05/19/2020 05/15/2020 04/22/2020 05/10/3566 08/07/6835  Systolic BP 290 - 211 155 208 022 336  Diastolic BP 70 - 77 96 77 73 82  Wt. (Lbs) 129.4 130 - 140 136 134.2 137  BMI 22.21 22.31 - 24.03 23.34 23.04 23.52   Foot/eye exam completion dates Latest Ref Rng & Units 06/17/2020 10/16/2019  Eye Exam No Retinopathy - No Retinopathy  Foot exam Order - - -  Foot Form Completion - Done -

## 2020-06-23 ENCOUNTER — Encounter (HOSPITAL_COMMUNITY)
Admission: RE | Admit: 2020-06-23 | Discharge: 2020-06-23 | Disposition: A | Discharge status: Home / Self Care | Payer: Medicare Other | Source: Ambulatory Visit | Attending: Urology | Admitting: Urology

## 2020-06-23 ENCOUNTER — Other Ambulatory Visit: Payer: Self-pay

## 2020-06-23 ENCOUNTER — Telehealth: Payer: Self-pay

## 2020-06-23 NOTE — Telephone Encounter (Signed)
Pt called saying she is supposed to have surgery on the 21st. She said she has not heard from the hospital about Preop. I gave her numbers to surgical scheduler and central scheduling because she has an appointment to go today.

## 2020-06-24 ENCOUNTER — Encounter (HOSPITAL_COMMUNITY): Payer: Self-pay

## 2020-06-24 ENCOUNTER — Other Ambulatory Visit (HOSPITAL_COMMUNITY)
Admission: RE | Admit: 2020-06-24 | Discharge: 2020-06-24 | Disposition: A | Discharge status: Home / Self Care | Payer: Medicare Other | Source: Ambulatory Visit | Attending: Urology | Admitting: Urology

## 2020-06-24 ENCOUNTER — Other Ambulatory Visit: Payer: Self-pay

## 2020-06-24 DIAGNOSIS — Z20822 Contact with and (suspected) exposure to covid-19: Secondary | ICD-10-CM | POA: Insufficient documentation

## 2020-06-24 DIAGNOSIS — Z01812 Encounter for preprocedural laboratory examination: Secondary | ICD-10-CM | POA: Diagnosis present

## 2020-06-24 LAB — SARS CORONAVIRUS 2 (TAT 6-24 HRS): SARS Coronavirus 2: NEGATIVE

## 2020-06-26 ENCOUNTER — Ambulatory Visit (HOSPITAL_COMMUNITY): Payer: Medicare Other | Admitting: Certified Registered Nurse Anesthetist

## 2020-06-26 ENCOUNTER — Other Ambulatory Visit: Payer: Self-pay

## 2020-06-26 ENCOUNTER — Encounter (HOSPITAL_COMMUNITY): Payer: Self-pay | Admitting: Urology

## 2020-06-26 ENCOUNTER — Encounter (HOSPITAL_COMMUNITY)
Admission: RE | Disposition: A | Discharge status: Home / Self Care | Payer: Self-pay | Source: Home / Self Care | Attending: Urology

## 2020-06-26 ENCOUNTER — Ambulatory Visit (HOSPITAL_COMMUNITY): Payer: Medicare Other

## 2020-06-26 ENCOUNTER — Ambulatory Visit (HOSPITAL_COMMUNITY)
Admission: RE | Admit: 2020-06-26 | Discharge: 2020-06-26 | Disposition: A | Discharge status: Home / Self Care | Payer: Medicare Other | Attending: Urology | Admitting: Urology

## 2020-06-26 DIAGNOSIS — Z833 Family history of diabetes mellitus: Secondary | ICD-10-CM | POA: Diagnosis not present

## 2020-06-26 DIAGNOSIS — Z8249 Family history of ischemic heart disease and other diseases of the circulatory system: Secondary | ICD-10-CM | POA: Insufficient documentation

## 2020-06-26 DIAGNOSIS — N201 Calculus of ureter: Secondary | ICD-10-CM | POA: Diagnosis not present

## 2020-06-26 DIAGNOSIS — E119 Type 2 diabetes mellitus without complications: Secondary | ICD-10-CM | POA: Diagnosis not present

## 2020-06-26 DIAGNOSIS — Z8673 Personal history of transient ischemic attack (TIA), and cerebral infarction without residual deficits: Secondary | ICD-10-CM | POA: Diagnosis not present

## 2020-06-26 DIAGNOSIS — Z800 Family history of malignant neoplasm of digestive organs: Secondary | ICD-10-CM | POA: Diagnosis not present

## 2020-06-26 DIAGNOSIS — N200 Calculus of kidney: Secondary | ICD-10-CM | POA: Diagnosis not present

## 2020-06-26 DIAGNOSIS — Z820 Family history of epilepsy and other diseases of the nervous system: Secondary | ICD-10-CM | POA: Diagnosis not present

## 2020-06-26 DIAGNOSIS — Z888 Allergy status to other drugs, medicaments and biological substances status: Secondary | ICD-10-CM | POA: Diagnosis not present

## 2020-06-26 DIAGNOSIS — I10 Essential (primary) hypertension: Secondary | ICD-10-CM | POA: Diagnosis not present

## 2020-06-26 DIAGNOSIS — Z87442 Personal history of urinary calculi: Secondary | ICD-10-CM | POA: Insufficient documentation

## 2020-06-26 HISTORY — PX: CYSTOSCOPY WITH RETROGRADE PYELOGRAM, URETEROSCOPY AND STENT PLACEMENT: SHX5789

## 2020-06-26 HISTORY — PX: HOLMIUM LASER APPLICATION: SHX5852

## 2020-06-26 LAB — GLUCOSE, CAPILLARY
Glucose-Capillary: 126 mg/dL — ABNORMAL HIGH (ref 70–99)
Glucose-Capillary: 96 mg/dL (ref 70–99)

## 2020-06-26 SURGERY — CYSTOURETEROSCOPY, WITH RETROGRADE PYELOGRAM AND STENT INSERTION
Anesthesia: General | Site: Bladder | Laterality: Right

## 2020-06-26 MED ORDER — ONDANSETRON HCL 4 MG/2ML IJ SOLN
INTRAMUSCULAR | Status: AC
  Filled 2020-06-26: qty 2

## 2020-06-26 MED ORDER — FENTANYL CITRATE (PF) 100 MCG/2ML IJ SOLN
50.0000 ug | INTRAMUSCULAR | Status: DC | PRN
  Administered 2020-06-26: 50 ug via INTRAVENOUS

## 2020-06-26 MED ORDER — CHLORHEXIDINE GLUCONATE 0.12 % MT SOLN
OROMUCOSAL | Status: AC
  Administered 2020-06-26: 15 mL via OROMUCOSAL
  Filled 2020-06-26: qty 15

## 2020-06-26 MED ORDER — FENTANYL CITRATE (PF) 100 MCG/2ML IJ SOLN
INTRAMUSCULAR | Status: AC
  Filled 2020-06-26: qty 2

## 2020-06-26 MED ORDER — FENTANYL CITRATE (PF) 250 MCG/5ML IJ SOLN
INTRAMUSCULAR | Status: DC | PRN
  Administered 2020-06-26 (×2): 25 ug via INTRAVENOUS
  Administered 2020-06-26: 50 ug via INTRAVENOUS
  Administered 2020-06-26: 25 ug via INTRAVENOUS

## 2020-06-26 MED ORDER — SODIUM CHLORIDE 0.9 % IR SOLN
Status: DC | PRN
  Administered 2020-06-26 (×2): 3000 mL via INTRAVESICAL

## 2020-06-26 MED ORDER — ONDANSETRON HCL 4 MG PO TABS: 4.0000 mg | ORAL_TABLET | Freq: Every day | ORAL | 1 refills | Status: DC | PRN

## 2020-06-26 MED ORDER — CEFAZOLIN SODIUM-DEXTROSE 2-4 GM/100ML-% IV SOLN
2.0000 g | INTRAVENOUS | Status: AC
  Administered 2020-06-26: 2 g via INTRAVENOUS

## 2020-06-26 MED ORDER — ORAL CARE MOUTH RINSE: 15.0000 mL | Freq: Once | OROMUCOSAL | Status: AC

## 2020-06-26 MED ORDER — LACTATED RINGERS IV SOLN: INTRAVENOUS | Status: DC

## 2020-06-26 MED ORDER — CEFAZOLIN SODIUM-DEXTROSE 2-4 GM/100ML-% IV SOLN
INTRAVENOUS | Status: AC
  Filled 2020-06-26: qty 100

## 2020-06-26 MED ORDER — PHENYLEPHRINE HCL (PRESSORS) 10 MG/ML IV SOLN
INTRAVENOUS | Status: DC | PRN
  Administered 2020-06-26 (×5): 80 ug via INTRAVENOUS

## 2020-06-26 MED ORDER — OXYCODONE-ACETAMINOPHEN 5-325 MG PO TABS: 1.0000 | ORAL_TABLET | ORAL | 0 refills | Status: DC | PRN

## 2020-06-26 MED ORDER — DIATRIZOATE MEGLUMINE 30 % UR SOLN
URETHRAL | Status: DC | PRN
  Administered 2020-06-26: 7 mL via URETHRAL

## 2020-06-26 MED ORDER — LIDOCAINE HCL (CARDIAC) PF 100 MG/5ML IV SOSY
PREFILLED_SYRINGE | INTRAVENOUS | Status: DC | PRN
  Administered 2020-06-26: 40 mg via INTRAVENOUS

## 2020-06-26 MED ORDER — ONDANSETRON HCL 4 MG/2ML IJ SOLN
INTRAMUSCULAR | Status: DC | PRN
  Administered 2020-06-26: 4 mg via INTRAVENOUS

## 2020-06-26 MED ORDER — PHENYLEPHRINE 40 MCG/ML (10ML) SYRINGE FOR IV PUSH (FOR BLOOD PRESSURE SUPPORT)
PREFILLED_SYRINGE | INTRAVENOUS | Status: AC
  Filled 2020-06-26: qty 20

## 2020-06-26 MED ORDER — WATER FOR IRRIGATION, STERILE IR SOLN
Status: DC | PRN
  Administered 2020-06-26: 500 mL

## 2020-06-26 MED ORDER — PROPOFOL 10 MG/ML IV BOLUS
INTRAVENOUS | Status: DC | PRN
  Administered 2020-06-26: 140 mg via INTRAVENOUS

## 2020-06-26 MED ORDER — LIDOCAINE HCL (PF) 2 % IJ SOLN
INTRAMUSCULAR | Status: AC
  Filled 2020-06-26: qty 5

## 2020-06-26 MED ORDER — CHLORHEXIDINE GLUCONATE 0.12 % MT SOLN: 15.0000 mL | Freq: Once | OROMUCOSAL | Status: AC

## 2020-06-26 MED ORDER — DIATRIZOATE MEGLUMINE 30 % UR SOLN
URETHRAL | Status: AC
  Filled 2020-06-26: qty 100

## 2020-06-26 SURGICAL SUPPLY — 26 items
BAG DRAIN URO TABLE W/ADPT NS (BAG) ×2 IMPLANT
BAG DRN 8 ADPR NS SKTRN CSTL (BAG) ×1
BAG HAMPER (MISCELLANEOUS) ×2 IMPLANT
CATH INTERMIT  6FR 70CM (CATHETERS) ×2 IMPLANT
CLOTH BEACON ORANGE TIMEOUT ST (SAFETY) ×2 IMPLANT
DECANTER SPIKE VIAL GLASS SM (MISCELLANEOUS) ×2 IMPLANT
EXTRACTOR STONE NITINOL NGAGE (UROLOGICAL SUPPLIES) ×2 IMPLANT
GLOVE BIO SURGEON STRL SZ8 (GLOVE) ×2 IMPLANT
GLOVE SURG UNDER POLY LF SZ7 (GLOVE) ×4 IMPLANT
GOWN STRL REUS W/TWL LRG LVL3 (GOWN DISPOSABLE) ×2 IMPLANT
GOWN STRL REUS W/TWL XL LVL3 (GOWN DISPOSABLE) ×2 IMPLANT
GUIDEWIRE STR DUAL SENSOR (WIRE) ×2 IMPLANT
GUIDEWIRE STR ZIPWIRE 035X150 (MISCELLANEOUS) ×2 IMPLANT
IV NS IRRIG 3000ML ARTHROMATIC (IV SOLUTION) ×4 IMPLANT
KIT TURNOVER CYSTO (KITS) ×2 IMPLANT
MANIFOLD NEPTUNE II (INSTRUMENTS) ×2 IMPLANT
PACK CYSTO (CUSTOM PROCEDURE TRAY) ×2 IMPLANT
PAD ARMBOARD 7.5X6 YLW CONV (MISCELLANEOUS) ×3 IMPLANT
SHEATH URETERAL 12FRX35CM (MISCELLANEOUS) ×1 IMPLANT
STENT URET 6FRX26 CONTOUR (STENTS) ×2 IMPLANT
SYR 10ML LL (SYRINGE) ×2 IMPLANT
SYR CONTROL 10ML LL (SYRINGE) ×2 IMPLANT
TOWEL OR 17X26 4PK STRL BLUE (TOWEL DISPOSABLE) ×2 IMPLANT
TRACTIP FLEXIVA PULS ID 200XHI (Laser) ×1 IMPLANT
TRACTIP FLEXIVA PULSE ID 200 (Laser) ×2
WATER STERILE IRR 500ML POUR (IV SOLUTION) ×2 IMPLANT

## 2020-06-26 NOTE — Anesthesia Procedure Notes (Signed)
Procedure Name: LMA Insertion Date/Time: 06/26/2020 10:00 AM Performed by: Karna Dupes, CRNA Pre-anesthesia Checklist: Patient identified, Emergency Drugs available, Suction available and Patient being monitored Patient Re-evaluated:Patient Re-evaluated prior to induction Oxygen Delivery Method: Circle system utilized Preoxygenation: Pre-oxygenation with 100% oxygen Induction Type: IV induction LMA: LMA inserted LMA Size: 3.0 Tube secured with: Tape Dental Injury: Teeth and Oropharynx as per pre-operative assessment

## 2020-06-26 NOTE — Anesthesia Preprocedure Evaluation (Signed)
Anesthesia Evaluation  Patient identified by MRN, date of birth, ID band Patient awake    Reviewed: Allergy & Precautions, H&P , NPO status , Patient's Chart, lab work & pertinent test results, reviewed documented beta blocker date and time   Airway Mallampati: II  TM Distance: >3 FB Neck ROM: full    Dental no notable dental hx.    Pulmonary neg pulmonary ROS,    Pulmonary exam normal breath sounds clear to auscultation       Cardiovascular Exercise Tolerance: Good hypertension, negative cardio ROS   Rhythm:regular Rate:Normal     Neuro/Psych  Headaches, TIAnegative psych ROS   GI/Hepatic negative GI ROS, Neg liver ROS,   Endo/Other  negative endocrine ROSdiabetes  Renal/GU Renal disease  negative genitourinary   Musculoskeletal   Abdominal   Peds  Hematology negative hematology ROS (+)   Anesthesia Other Findings   Reproductive/Obstetrics negative OB ROS                             Anesthesia Physical Anesthesia Plan  ASA: III  Anesthesia Plan: General   Post-op Pain Management:    Induction:   PONV Risk Score and Plan: Propofol infusion  Airway Management Planned:   Additional Equipment:   Intra-op Plan:   Post-operative Plan:   Informed Consent: I have reviewed the patients History and Physical, chart, labs and discussed the procedure including the risks, benefits and alternatives for the proposed anesthesia with the patient or authorized representative who has indicated his/her understanding and acceptance.     Dental Advisory Given  Plan Discussed with: CRNA  Anesthesia Plan Comments:         Anesthesia Quick Evaluation

## 2020-06-26 NOTE — H&P (Addendum)
Urology Admission H&P  Chief Complaint: right flank pain  History of Present Illness: [name] is a 70yo here for right ureteroscopic stone extraction. She underwent right ureteral stent placement 5 weeks ago for an infected right ureteral calculus. She has severe LUTS with the stent in place. No fevers  Past Medical History:  Diagnosis Date  . BACK PAIN, THORACIC REGION, RIGHT 01/27/2010   Qualifier: Diagnosis of  By: Moshe Cipro MD, Joycelyn Schmid    . Callus of foot 07/15/2017  . Essential hypertension   . Headache(784.0)   . Hematuria 02/24/2016  . History of kidney stones   . Hyperlipidemia   . Irregular heart beats   . Microscopic hematuria 02/24/2016  . Neck pain on right side 01/23/2013  . Piles (hemorrhoids) 06/22/2011  . Renal calculus, right 05/31/2016  . Shoulder pain, right 01/25/2014  . SKIN TAG 06/13/2008   Qualifier: Diagnosis of  By: Moshe Cipro MD, Joycelyn Schmid    . TIA (transient ischemic attack) 06/19/2016  . Type 2 diabetes mellitus ([place])   . Vaginitis    Past Surgical History:  Procedure Laterality Date  . COLONOSCOPY N/A 10/26/2018   Procedure: COLONOSCOPY;  Surgeon: Rogene Houston, MD;  Location: [facility];  Service: Endoscopy;  Laterality: N/A;  830-10:30am  . cyst removed Right 1998   cyst- removed from right wrist  . CYSTOSCOPY WITH RETROGRADE PYELOGRAM, URETEROSCOPY AND STENT PLACEMENT Right 05/19/2020   Procedure: CYSTOSCOPY WITH RETROGRADE PYELOGRAM, URETEROSCOPY AND STENT PLACEMENT;  Surgeon: Cleon Gustin, MD;  Location: [facility];  Service: Urology;  Laterality: Right;  . EXTRACORPOREAL SHOCK WAVE LITHOTRIPSY Right 05/29/2018   Procedure: EXTRACORPOREAL SHOCK WAVE LITHOTRIPSY (ESWL);  Surgeon: Kathie Rhodes, MD;  Location: [facility];  Service: Urology;  Laterality: Right;  . lithotrpsy     over 20 years  . NEPHROLITHOTOMY Right 05/31/2016   Procedure: NEPHROLITHOTOMY PERCUTANEOUS WITH SURGEON ACCESS;  Surgeon: Cleon Gustin, MD;  Location: [facility];  Service:  Urology;  Laterality: Right;  . POLYPECTOMY  10/26/2018   Procedure: POLYPECTOMY;  Surgeon: Rogene Houston, MD;  Location[facility];  Service: Endoscopy;;  colon  . TOTAL ABDOMINAL HYSTERECTOMY W/ BILATERAL SALPINGOOPHORECTOMY  1998  . TUBAL LIGATION      Home Medications:  Current Facility-Administered Medications  Medication Dose Route Frequency Provider Last Rate Last Admin  . ceFAZolin (ANCEF) 2-4 GM/100ML-% IVPB           . ceFAZolin (ANCEF) IVPB 2g/100 mL premix  2 g Intravenous 30 min Pre-Op Pruitt Taboada, Candee Furbish, MD      . chlorhexidine (PERIDEX) 0.12 % solution            Allergies:  Allergies  Allergen Reactions  . Onglyza [Saxagliptin Hydrochloride] Swelling    Tongue swell  . Pravastatin Other (See Comments)    Muscle aches  . Ace Inhibitors Cough    Family History  Problem Relation Age of Onset  . Alcohol abuse Mother   . Esophageal cancer Father   . Alcohol abuse Father   . Diabetes Sister   . Diabetes Sister   . Hypertension Sister   . Heart attack Sister        in 3's  . Seizures Brother    Social History:  reports that she has never smoked. She has never used smokeless tobacco. She reports that she does not drink alcohol and does not use drugs.  Review of Systems  Genitourinary: Positive for flank pain.  All other systems reviewed and are  negative.   Physical Exam:  Vital signs in last 24 hours: Temp:  [98.3 F (36.8 C)] 98.3 F (36.8 C) (04/21 0818) Pulse Rate:  [91] 91 (04/21 0818) BP: (122)/(75) 122/75 (04/21 0818) SpO2:  [99 %] 99 % (04/21 0818) Physical Exam Vitals reviewed.  Constitutional:      Appearance: Normal appearance.  HENT:     Head: Normocephalic and atraumatic.     Nose: Nose normal.     Mouth/Throat:     Mouth: Mucous membranes are dry.  Eyes:     Extraocular Movements: Extraocular movements intact.     Pupils: Pupils are equal, round, and reactive to light.  Cardiovascular:     Rate and Rhythm: Normal rate and  regular rhythm.  Pulmonary:     Effort: Pulmonary effort is normal. No respiratory distress.  Abdominal:     General: Abdomen is flat. There is no distension.  Musculoskeletal:        General: No swelling. Normal range of motion.     Cervical back: Normal range of motion and neck supple.  Skin:    General: Skin is warm and dry.  Neurological:     General: No focal deficit present.     Mental Status: She is alert and oriented to person, place, and time.  Psychiatric:        Mood and Affect: Mood normal.        Behavior: Behavior normal.        Thought Content: Thought content normal.        Judgment: Judgment normal.     Laboratory Data:  Results for orders placed or performed during the hospital encounter of 06/26/20 (from the past 24 hour(s))  Glucose, capillary     Status: Abnormal   Collection Time: 06/26/20  8:01 AM  Result Value Ref Range   Glucose-Capillary 126 (H) 70 - 99 mg/dL   Recent Results (from the past 240 hour(s))  SARS CORONAVIRUS 2 (TAT 6-24 HRS) Nasopharyngeal Nasopharyngeal Swab     Status: None   Collection Time: 06/24/20  8:48 AM   Specimen: Nasopharyngeal Swab  Result Value Ref Range Status   SARS Coronavirus 2 NEGATIVE NEGATIVE Final    Comment: (NOTE) SARS-CoV-2 target nucleic acids are NOT DETECTED.  The SARS-CoV-2 RNA is generally detectable in upper and lower respiratory specimens during the acute phase of infection. Negative results do not preclude SARS-CoV-2 infection, do not rule out co-infections with other pathogens, and should not be used as the sole basis for treatment or other patient management decisions. Negative results must be combined with clinical observations, patient history, and epidemiological information. The expected result is Negative.  Fact Sheet for Patients: SugarRoll.be  Fact Sheet for Healthcare Providers: https://www.woods-mathews.com/  This test is not yet approved or  cleared by the [place] FDA and  has been authorized for detection and/or diagnosis of SARS-CoV-2 by FDA under an Emergency Use Authorization (EUA). This EUA will remain  in effect (meaning this test can be used) for the duration of the COVID-19 declaration under Se ction 564(b)(1) of the Act, 21 U.S.C. section 360bbb-3(b)(1), unless the authorization is terminated or revoked sooner.  Performed at [facility], [place] [address], [place], [place] [address]    Creatinine: No results for input(s): CREATININE in the last 168 hours. Baseline Creatinine: unknwon  Impression/Assessment:  70yo with right renal calculus  Plan:  -We discussed the management of kidney stones. These options include observation, ureteroscopy, shockwave lithotripsy (ESWL) and  percutaneous nephrolithotomy (PCNL). We discussed which options are relevant to the patient's stone(s). We discussed the natural history of kidney stones as well as the complications of untreated stones and the impact on quality of life without treatment as well as with each of the above listed treatments. We also discussed the efficacy of each treatment in its ability to clear the stone burden. With any of these management options I discussed the signs and symptoms of infection and the need for emergent treatment should these be experienced. For each option we discussed the ability of each procedure to clear the patient of their stone burden.   For observation I described the risks which include but are not limited to silent renal damage, life-threatening infection, need for emergent surgery, failure to pass stone and pain.   For ureteroscopy I described the risks which include bleeding, infection, damage to contiguous structures, positioning injury, ureteral stricture, ureteral avulsion, ureteral injury, need for prolonged ureteral stent, inability to perform ureteroscopy, need for an interval procedure, inability to clear stone burden,  stent discomfort/pain, heart attack, stroke, pulmonary embolus and the inherent risks with general anesthesia.   For shockwave lithotripsy I described the risks which include arrhythmia, kidney contusion, kidney hemorrhage, need for transfusion, pain, inability to adequately break up stone, inability to pass stone fragments, Steinstrasse, infection associated with obstructing stones, need for alternate surgical procedure, need for repeat shockwave lithotripsy, MI, CVA, PE and the inherent risks with anesthesia/conscious sedation.   For PCNL I described the risks including positioning injury, pneumothorax, hydrothorax, need for chest tube, inability to clear stone burden, renal laceration, arterial venous fistula or malformation, need for embolization of kidney, loss of kidney or renal function, need for repeat procedure, need for prolonged nephrostomy tube, ureteral avulsion, MI, CVA, PE and the inherent risks of general anesthesia.   - The patient would like to proceed with right ureteroscopic stone extraction  [name] 06/26/2020, 9:18 AM

## 2020-06-26 NOTE — Op Note (Signed)
.  Preoperative diagnosis: right renal stone  Postoperative diagnosis: Same  Procedure: 1 cystoscopy 2.  right retrograde pyelography 3.  Intraoperative fluoroscopy, under one hour, with interpretation 4.  Right ureteroscopic stone manipulation with laser lithotripsy 5.  Right 6 x 26 JJ stent placement  Attending: Rosie Fate  Anesthesia: General  Estimated blood loss: None  Drains: right 6 x 26 JJ ureteral stent with tether  Specimens: stone for analysis  Antibiotics: ancef  Findings: right UPJ stone. No hydronephrosis. No masses/lesions in the bladder. Ureteral orifices in normal anatomic location.  Indications: Patient is a 70 year old emale with a history of right renal stone and who underwent stent placement 5 weeks ago for pyuria.  After discussing treatment options, she decided proceed with right ureteroscopic stone manipulation.  Procedure her in detail: The patient was brought to the operating room and a brief timeout was done to ensure correct patient, correct procedure, correct site.  General anesthesia was administered patient was placed in dorsal lithotomy position.  Her genitalia was then prepped and draped in usual sterile fashion.  A rigid 51 French cystoscope was passed in the urethra and the bladder.  Bladder was inspected free masses or lesions.  the ureteral orifices were in the normal orthotopic locations.  a 6 french ureteral catheter was then instilled into the right ureteral orifice.  a gentle retrograde was obtained and findings noted above. We then advanced a sensor wire though the ureteral catheter and up to the renal pelvis. Using a grasper the right ureteral stent was brought to the urethral meatus. we then placed a zip wire through the ureteral stent and advanced up to the renal pelvis.  We then advanced a 12/14 x 35cm access sheath up to the renal pelvis. We then used the flexible ureteroscope to perform nephroscopy. We encountered the stone at the UPJ.  Using a 242nm laser fiber eh stone was dusted. The larger fragments were then removed with a Ngage basket.    once all stone fragments were removed we then removed the access sheath under direct vision and noted no injury to the ureter. We then placed a 6 x 26 double-j ureteral stent over the original zip wire.  We then removed the wire and good coil was noted in the the renal pelvis under fluoroscopy and the bladder under direct vision. the bladder was then drained and this concluded the procedure which was well tolerated by patient.  Complications: None  Condition: Stable, extubated, transferred to PACU  Plan: Patient is to be discharged home as to follow-up in one week. She is to remove her stent in 72 hours by pulling the tether

## 2020-06-26 NOTE — Discharge Instructions (Signed)
Ureteral Stent Implantation, Care After This sheet gives you information about how to care for yourself after your procedure. Your health care provider may also give you more specific instructions. If you have problems or questions, contact your health care provider. What can I expect after the procedure? After the procedure, it is common to have:  Nausea.  Mild pain when you urinate. You may feel this pain in your lower back or lower abdomen. The pain should stop within a few minutes after you urinate. This may last for up to 1 week.  A small amount of blood in your urine for several days. Follow these instructions at home: Medicines  Take over-the-counter and prescription medicines only as told by your health care provider.  If you were prescribed an antibiotic medicine, take it as told by your health care provider. Do not stop taking the antibiotic even if you start to feel better.  Do not drive for 24 hours if you were given a sedative during your procedure.  Ask your health care provider if the medicine prescribed to you requires you to avoid driving or using heavy machinery. Activity  Rest as told by your health care provider.  Avoid sitting for a long time without moving. Get up to take short walks every 1-2 hours. This is important to improve blood flow and breathing. Ask for help if you feel weak or unsteady.  Return to your normal activities as told by your health care provider. Ask your health care provider what activities are safe for you. General instructions  Watch for any blood in your urine. Call your health care provider if the amount of blood in your urine increases.  If you have a catheter: ? Follow instructions from your health care provider about taking care of your catheter and collection bag. ? Do not take baths, swim, or use a hot tub until your health care provider approves. Ask your health care provider if you may take showers. You may only be allowed to  take sponge baths.  Drink enough fluid to keep your urine pale yellow.  Do not use any products that contain nicotine or tobacco, such as cigarettes, e-cigarettes, and chewing tobacco. These can delay healing after surgery. If you need help quitting, ask your health care provider.  Keep all follow-up visits as told by your health care provider. This is important.   Contact a health care provider if:  You have pain that gets worse or does not get better with medicine, especially pain when you urinate.  You have difficulty urinating.  You feel nauseous or you vomit repeatedly during a period of more than 2 days after the procedure. Get help right away if:  Your urine is dark red or has blood clots in it.  You are leaking urine (have incontinence).  The end of the stent comes out of your urethra.  You cannot urinate.  You have sudden, sharp, or severe pain in your abdomen or lower back.  You have a fever.  You have swelling or pain in your legs.  You have difficulty breathing. Summary  After the procedure, it is common to have mild pain when you urinate that goes away within a few minutes after you urinate. This may last for up to 1 week.  Watch for any blood in your urine. Call your health care provider if the amount of blood in your urine increases.  Take over-the-counter and prescription medicines only as told by your health care provider.  Drink enough fluid to keep your urine pale yellow. This information is not intended to replace advice given to you by your health care provider. Make sure you discuss any questions you have with your health care provider. Document Revised: 11/29/2017 Document Reviewed: 11/30/2017 Elsevier Patient Education  2021 [employer] STENT IN 42 HOURS BY GENTLY PULLING THE STRING     General Anesthesia, Adult, Care After This sheet gives you information about how to care for yourself after your procedure. Your  health care provider may also give you more specific instructions. If you have problems or questions, contact your health care provider. What can I expect after the procedure? After the procedure, the following side effects are common:  Pain or discomfort at the IV site.  Nausea.  Vomiting.  Sore throat.  Trouble concentrating.  Feeling cold or chills.  Feeling weak or tired.  Sleepiness and fatigue.  Soreness and body aches. These side effects can affect parts of the body that were not involved in surgery. Follow these instructions at home: For the time period you were told by your health care provider:  Rest.  Do not participate in activities where you could fall or become injured.  Do not drive or use machinery.  Do not drink alcohol.  Do not take sleeping pills or medicines that cause drowsiness.  Do not make important decisions or sign legal documents.  Do not take care of children on your own.   Eating and drinking  Follow any instructions from your health care provider about eating or drinking restrictions.  When you feel hungry, start by eating small amounts of foods that are soft and easy to digest (bland), such as toast. Gradually return to your regular diet.  Drink enough fluid to keep your urine pale yellow.  If you vomit, rehydrate by drinking water, juice, or clear broth. General instructions  If you have sleep apnea, surgery and certain medicines can increase your risk for breathing problems. Follow instructions from your health care provider about wearing your sleep device: ? Anytime you are sleeping, including during daytime naps. ? While taking prescription pain medicines, sleeping medicines, or medicines that make you drowsy.  Have a responsible adult stay with you for the time you are told. It is important to have someone help care for you until you are awake and alert.  Return to your normal activities as told by your health care provider.  Ask your health care provider what activities are safe for you.  Take over-the-counter and prescription medicines only as told by your health care provider.  If you smoke, do not smoke without supervision.  Keep all follow-up visits as told by your health care provider. This is important. Contact a health care provider if:  You have nausea or vomiting that does not get better with medicine.  You cannot eat or drink without vomiting.  You have pain that does not get better with medicine.  You are unable to pass urine.  You develop a skin rash.  You have a fever.  You have redness around your IV site that gets worse. Get help right away if:  You have difficulty breathing.  You have chest pain.  You have blood in your urine or stool, or you vomit blood. Summary  After the procedure, it is common to have a sore throat or nausea. It is also common to feel tired.  Have a responsible adult stay with you for the time  you are told. It is important to have someone help care for you until you are awake and alert.  When you feel hungry, start by eating small amounts of foods that are soft and easy to digest (bland), such as toast. Gradually return to your regular diet.  Drink enough fluid to keep your urine pale yellow.  Return to your normal activities as told by your health care provider. Ask your health care provider what activities are safe for you. This information is not intended to replace advice given to you by your health care provider. Make sure you discuss any questions you have with your health care provider. Document Revised: 11/08/2019 Document Reviewed: 06/07/2019 Elsevier Patient Education  2021 [employer].

## 2020-06-26 NOTE — Transfer of Care (Signed)
Immediate Anesthesia Transfer of Care Note  Patient: [name]  Procedure(s) Performed: CYSTOSCOPY WITH RETROGRADE PYELOGRAM, URETEROSCOPY AND STENT EXCHANGE (Right Bladder) HOLMIUM LASER APPLICATION (Right Bladder)  Patient Location: PACU  Anesthesia Type:General  Level of Consciousness: awake and alert   Airway & Oxygen Therapy: Patient Spontanous Breathing and Patient connected to nasal cannula oxygen  Post-op Assessment: Report given to RN and Post -op Vital signs reviewed and stable  Post vital signs: Reviewed and stable  Last Vitals:  Vitals Value Taken Time  BP    Temp    Pulse 88 06/26/20 1103  Resp 9 06/26/20 1103  SpO2 100 % 06/26/20 1103  Vitals shown include unvalidated device data.  Last Pain:  Vitals:   06/26/20 0818  TempSrc: Oral  PainSc: 6          Complications: No complications documented.

## 2020-06-26 NOTE — Anesthesia Postprocedure Evaluation (Signed)
Anesthesia Post Note  Patient: [name]  Procedure(s) Performed: CYSTOSCOPY WITH RETROGRADE PYELOGRAM, URETEROSCOPY AND STENT EXCHANGE (Right Bladder) HOLMIUM LASER APPLICATION (Right Bladder)  Patient location during evaluation: Phase II Anesthesia Type: General Level of consciousness: awake Pain management: pain level controlled Vital Signs Assessment: post-procedure vital signs reviewed and stable Respiratory status: spontaneous breathing and respiratory function stable Cardiovascular status: blood pressure returned to baseline and stable Postop Assessment: no headache and no apparent nausea or vomiting Anesthetic complications: no Comments: Late entry   No complications documented.   Last Vitals:  Vitals:   06/26/20 1200 06/26/20 1208  BP: 118/67 121/64  Pulse: 88 78  Resp: 20 18  Temp:  36.5 C  SpO2: 93% 93%    Last Pain:  Vitals:   06/26/20 1208  TempSrc: Oral  PainSc: [place]

## 2020-06-27 ENCOUNTER — Encounter (HOSPITAL_COMMUNITY): Payer: Self-pay | Admitting: Urology

## 2020-06-28 DIAGNOSIS — K838 Other specified diseases of biliary tract: Secondary | ICD-10-CM | POA: Insufficient documentation

## 2020-06-30 ENCOUNTER — Telehealth: Payer: Self-pay

## 2020-06-30 LAB — CALCULI, WITH PHOTOGRAPH (CLINICAL LAB)
Calcium Oxalate Dihydrate: 90 %
Calcium Oxalate Monohydrate: 10 %
Weight Calculi: 4 mg

## 2020-06-30 NOTE — Telephone Encounter (Signed)
Called pt at sons home to follow up after triage call. Spoke with Daughter in Sports coach. Said pt was lethargic and blood sugar was low on 4/22. Pt has fell earlier. They took pt to [facility] in [place] and pt is still there. Per daughter in law pt had an infection. Said she will call whether pt can come to next appointment scheduled this week.

## 2020-07-02 ENCOUNTER — Other Ambulatory Visit: Payer: Self-pay

## 2020-07-02 ENCOUNTER — Ambulatory Visit (INDEPENDENT_AMBULATORY_CARE_PROVIDER_SITE_OTHER): Payer: Medicare Other | Admitting: Urology

## 2020-07-02 ENCOUNTER — Ambulatory Visit (HOSPITAL_COMMUNITY)
Admission: RE | Admit: 2020-07-02 | Discharge: 2020-07-02 | Disposition: A | Discharge status: Home / Self Care | Payer: Medicare Other | Source: Ambulatory Visit | Attending: Family Medicine | Admitting: Family Medicine

## 2020-07-02 ENCOUNTER — Encounter: Payer: Self-pay | Admitting: Urology

## 2020-07-02 DIAGNOSIS — Z1231 Encounter for screening mammogram for malignant neoplasm of breast: Secondary | ICD-10-CM | POA: Diagnosis present

## 2020-07-02 DIAGNOSIS — N200 Calculus of kidney: Secondary | ICD-10-CM | POA: Diagnosis not present

## 2020-07-02 NOTE — Progress Notes (Signed)
07/02/2020 1:10 PM   [name] Jul 10, 1950 [id number]  Referring provider: Fayrene Helper, MD [address], [place],  [place] [address]   Patient location: home Physician location: office I connected with  [name] on 07/15/20 by a video enabled telemedicine application and verified that I am speaking with the correct person using two identifiers.   I discussed the limitations of evaluation and management by telemedicine. The patient expressed understanding and agreed to proceed.    nephrolithiasis  HPI: [name] is 70yo seen today for nephrolithiasis. She underwent right ureteroscopic stone extraction last week and then developed fever after the surgery. She was admitted to [facility] from 4/22-4/25. She was discharged home on antibiotics and is currently doing well. She denies any flank pain. She denies nay LUTS.    PMH: Past Medical History:  Diagnosis Date  . BACK PAIN, THORACIC REGION, RIGHT 01/27/2010   Qualifier: Diagnosis of  By: Moshe Cipro MD, Joycelyn Schmid    . Callus of foot 07/15/2017  . Essential hypertension   . Headache(784.0)   . Hematuria 02/24/2016  . History of kidney stones   . Hyperlipidemia   . Irregular heart beats   . Microscopic hematuria 02/24/2016  . Neck pain on right side 01/23/2013  . Piles (hemorrhoids) 06/22/2011  . Renal calculus, right 05/31/2016  . Shoulder pain, right 01/25/2014  . SKIN TAG 06/13/2008   Qualifier: Diagnosis of  By: Moshe Cipro MD, Joycelyn Schmid    . TIA (transient ischemic attack) 06/19/2016  . Type 2 diabetes mellitus ([place])   . Vaginitis     Surgical History: Past Surgical History:  Procedure Laterality Date  . COLONOSCOPY N/A 10/26/2018   Procedure: COLONOSCOPY;  Surgeon: Rogene Houston, MD;  [facility]: [facility];  Service: Endoscopy;  Laterality: N/A;  830-10:30am  . cyst removed Right 1998   cyst- removed from right wrist  . CYSTOSCOPY WITH RETROGRADE PYELOGRAM, URETEROSCOPY AND STENT PLACEMENT  Right 05/19/2020   Procedure: CYSTOSCOPY WITH RETROGRADE PYELOGRAM, URETEROSCOPY AND STENT PLACEMENT;  Surgeon: Cleon Gustin, MD;  Location: [facility];  Service: Urology;  Laterality: Right;  . CYSTOSCOPY WITH RETROGRADE PYELOGRAM, URETEROSCOPY AND STENT PLACEMENT Right 06/26/2020   Procedure: CYSTOSCOPY WITH RETROGRADE PYELOGRAM, URETEROSCOPY AND STENT EXCHANGE;  Surgeon: Cleon Gustin, MD;  Location: [facility];  Service: Urology;  Laterality: Right;  . EXTRACORPOREAL SHOCK WAVE LITHOTRIPSY Right 05/29/2018   Procedure: EXTRACORPOREAL SHOCK WAVE LITHOTRIPSY (ESWL);  Surgeon: Kathie Rhodes, MD;  Location: [facility];  Service: Urology;  Laterality: Right;  . HOLMIUM LASER APPLICATION Right 5/42/7062   Procedure: HOLMIUM LASER APPLICATION;  Surgeon: Cleon Gustin, MD;  Location: [facility];  Service: Urology;  Laterality: Right;  . lithotrpsy     over 20 years  . NEPHROLITHOTOMY Right 05/31/2016   Procedure: NEPHROLITHOTOMY PERCUTANEOUS WITH SURGEON ACCESS;  Surgeon: Cleon Gustin, MD;  Location: [facility];  Service: Urology;  Laterality: Right;  . POLYPECTOMY  10/26/2018   Procedure: POLYPECTOMY;  Surgeon: Rogene Houston, MD;  Location: [facility];  Service: Endoscopy;;  colon  . TOTAL ABDOMINAL HYSTERECTOMY W/ BILATERAL SALPINGOOPHORECTOMY  1998  . TUBAL LIGATION      Home Medications:  Allergies as of 07/02/2020      Reactions   Onglyza [saxagliptin Hydrochloride] Swelling   Tongue swell   Pravastatin Other (See Comments)   Muscle aches   Ace Inhibitors Cough      Medication List       Accurate as of  July 02, 2020  1:10 PM. If you have any questions, ask your nurse or doctor.        acetaminophen 500 MG tablet Commonly known as: TYLENOL Take 1,000 mg by mouth every 6 (six) hours as needed for headache or moderate pain.   aspirin EC 81 MG tablet Take 1 tablet (81 mg total) by mouth daily.   atorvastatin 10 MG tablet Commonly known as: LIPITOR Take one tablet by  mouth every Monday, Wednesday and Friday, and take half tablet , every Tuesday, Thursday, Saturday and Sunday What changed:   how much to take  how to take this  when to take this  additional instructions   B-D ULTRAFINE III SHORT PEN 31G X 8 MM Misc Generic drug: Insulin Pen Needle FOR USE WITH INSULIN ONCE DAILY DX E11.9   clotrimazole-betamethasone cream Commonly known as: LOTRISONE Apply 1 application topically 2 (two) times daily.   ferrous sulfate 325 (65 FE) MG tablet Take 325 mg by mouth 2 (two) times daily with a meal.   hydroquinone 4 % cream Apply topically 2 (two) times daily.   Lantus SoloStar 100 UNIT/ML Solostar Pen Generic drug: insulin glargine INJECT 22 UNITS INTO THE SKIN DAILY AT 10 PM. What changed:   how much to take  how to take this  when to take this   losartan 25 MG tablet Commonly known as: COZAAR Take 12.5 mg by mouth daily.   Lubricant Eye Drops 0.4-0.3 % Soln Generic drug: Polyethyl Glycol-Propyl Glycol Place 1 drop into both eyes 3 (three) times daily as needed (dry/irritated eyes.).   metFORMIN 1000 MG tablet Commonly known as: GLUCOPHAGE TAKE 1 TABLET (1,000 MG TOTAL) BY MOUTH 2 (TWO) TIMES DAILY WITH A MEAL.   ondansetron 4 MG tablet Commonly known as: Zofran Take 1 tablet (4 mg total) by mouth daily as needed for nausea or vomiting.   OneTouch Delica Lancets 08M Misc Once daily dx e11.9 (give lancects that go with pts meter)   OneTouch Ultra test strip Generic drug: glucose blood USE TO TEST BLOOD SUGAR 3 TIMES A DAY AS DIRECTED DX E11.65   oxyCODONE-acetaminophen 5-325 MG tablet Commonly known as: Percocet Take 1 tablet by mouth every 4 (four) hours as needed.   trimethoprim 100 MG tablet Commonly known as: TRIMPEX Take 1 tablet (100 mg total) by mouth daily. To take nightly until you have surgery.   Vitamin D3 50 MCG (2000 UT) Tabs Take 2,000 Units by mouth daily.       Allergies:  Allergies  Allergen  Reactions  . Onglyza [Saxagliptin Hydrochloride] Swelling    Tongue swell  . Pravastatin Other (See Comments)    Muscle aches  . Ace Inhibitors Cough    Family History: Family History  Problem Relation Age of Onset  . Alcohol abuse Mother   . Esophageal cancer Father   . Alcohol abuse Father   . Diabetes Sister   . Diabetes Sister   . Hypertension Sister   . Heart attack Sister        in 30's  . Seizures Brother     Social History:  reports that she has never smoked. She has never used smokeless tobacco. She reports that she does not drink alcohol and does not use drugs.  ROS: All other review of systems were reviewed and are negative except what is noted above in HPI  Physical Exam: There were no vitals taken for this visit.  Constitutional:  Alert and oriented, No acute  distress. HEENT: [place] AT, moist mucus membranes.  Trachea midline, no masses. Cardiovascular: No clubbing, cyanosis, or edema. Respiratory: Normal respiratory effort, no increased work of breathing. GI: Abdomen is soft, nontender, nondistended, no abdominal masses GU: No CVA tenderness.  Lymph: No cervical or inguinal lymphadenopathy. Skin: No rashes, bruises or suspicious lesions. Neurologic: Grossly intact, no focal deficits, moving all 4 extremities. Psychiatric: Normal mood and affect.  Laboratory Data: Lab Results  Component Value Date   WBC 5.2 05/15/2020   HGB 12.3 05/15/2020   HCT 41.0 05/15/2020   MCV 94.3 05/15/2020   PLT 423 (H) 05/15/2020    Lab Results  Component Value Date   CREATININE 0.85 05/15/2020    No results found for: PSA  No results found for: TESTOSTERONE  Lab Results  Component Value Date   HGBA1C 7.1 (H) 05/15/2020    Urinalysis    Component Value Date/Time   COLORURINE YELLOW 05/15/2020 1243   APPEARANCEUR CLOUDY (A) 05/15/2020 1243   APPEARANCEUR Cloudy (A) 05/14/2020 1150   LABSPEC 1.016 05/15/2020 1243   PHURINE 6.0 05/15/2020 1243   GLUCOSEU >=500  (A) 05/15/2020 1243   HGBUR LARGE (A) 05/15/2020 1243   BILIRUBINUR NEGATIVE 05/15/2020 1243   BILIRUBINUR Negative 05/14/2020 1150   KETONESUR 5 (A) 05/15/2020 1243   PROTEINUR 100 (A) 05/15/2020 1243   UROBILINOGEN 0.2 01/24/2020 0921   UROBILINOGEN 0.2 01/25/2014 0845   NITRITE NEGATIVE 05/15/2020 1243   LEUKOCYTESUR LARGE (A) 05/15/2020 1243    Lab Results  Component Value Date   LABMICR See below: 05/14/2020   WBCUA 11-30 (A) 05/14/2020   LABEPIT >10 (A) 05/14/2020   MUCUS Present 05/14/2020   BACTERIA RARE (A) 05/15/2020    Pertinent Imaging:  Results for orders placed during the hospital encounter of 04/10/20  Abdomen 1 view (KUB)  Narrative CLINICAL DATA:  History of nephrolithiasis  EXAM: ABDOMEN - 1 VIEW  COMPARISON:  Ultrasound from 02/14/2020  FINDINGS: Scattered large and small bowel gas is noted. Bilateral renal calculi are noted largest of these measures approximately 9.4 cm on the left and 1.7 cm on the right. No definitive ureteral stones are seen. No bony abnormality is noted.  IMPRESSION: Bilateral renal calculi as described.   Electronically Signed By: Inez Catalina M.D. On: 04/11/2020 02:04  No results found for this or any previous visit.  No results found for this or any previous visit.  No results found for this or any previous visit.  Results for orders placed during the hospital encounter of 02/14/20  US RENAL  Narrative CLINICAL DATA:  Initial evaluation for stage 2 chronic kidney disease.  EXAM: RENAL / URINARY TRACT ULTRASOUND COMPLETE  COMPARISON:  Prior CT from 04/20/2016.  FINDINGS: Right Kidney:  Renal measurements: 11.2 x 4.9 x 5.7 cm = volume: 162.3 mL. Renal echogenicity within normal limits. 1.5 cm stone seen positioned at the renal pelvis. Mild caliectasis seen proximally at the mid and upper pole. No frank hydronephrosis. No focal renal mass.  Left Kidney:  Renal measurements: 11.7 x 6.4 x 5.1 cm =  volume: 199.0 mL. Renal echogenicity within normal limits. 1.3 cm nonobstructive stone present at the lower pole. Minimal caliectasis without hydronephrosis. No focal renal mass.  Bladder:  Appears normal for degree of bladder distention.  Other:  None.  IMPRESSION: 1. 1.5 cm stone positioned at the right renal pelvis. Secondary mild caliectasis at the mid and upper right kidney without frank hydronephrosis. 2. 1.3 cm nonobstructive stone at the lower pole  of the left kidney. 3. Renal echogenicity within normal limits.   Electronically Signed By: Jeannine Boga M.D. On: 02/14/2020 22:38  No results found for this or any previous visit.  No results found for this or any previous visit.  No results found for this or any previous visit.   Assessment & Plan:    1. Nephrolithiasis -RTC 1 month for renal US  No follow-ups on file.  [name], MD  [facility]

## 2020-07-02 NOTE — Patient Instructions (Signed)

## 2020-07-21 ENCOUNTER — Other Ambulatory Visit: Payer: Self-pay | Admitting: Family Medicine

## 2020-07-21 ENCOUNTER — Telehealth: Payer: Self-pay

## 2020-07-21 NOTE — Telephone Encounter (Signed)
Refill sent.

## 2020-07-21 NOTE — Telephone Encounter (Signed)
Patient called need refill   bduf short pen needles BD Ultra fine short needs to be sent to [facility]

## 2020-07-28 ENCOUNTER — Telehealth: Payer: Self-pay

## 2020-07-28 NOTE — Telephone Encounter (Signed)
I have sent e-mail to Graceann Congress in billing today    07/28/20  Patient has called again and requesting the Physical charged on 12/04/19 be removed from her account.  Dr Moshe Cipro said this was billed in error and should have already been taken off the account.   04/08/20 This patient was billed in error 12/04/19 [id number] per Dr Moshe Cipro.  I have asked Graceann Congress to re-review this account since the MD is saying this is an error.  The patient was charged 3 visits in the same session.  Emailed Verdis Frederickson

## 2020-07-28 NOTE — Telephone Encounter (Signed)
Patient called asked if you would check to see if her billing was corrected she said it was turn over to collections from our office. She said it goes back to a year ago.  I looked but I did not not see where she owed for our office. She would like for our office to give her a call back before she calls the [facility] billing phone number.

## 2020-07-29 ENCOUNTER — Ambulatory Visit: Payer: Medicare Other | Admitting: Nutrition

## 2020-07-31 NOTE — Telephone Encounter (Signed)
Called Ms Barclift and let her know Verdis Frederickson from Billing is working on this.    Email from Verdis Frederickson 07/29/20 Sorry about that. I was thinking this one had been taken care of, but I have my Lattie Haw on it now.   Graceann Congress MBA, [facility], Jalyiah Shelley, [place]  SW-Professional Counsellor

## 2020-08-15 ENCOUNTER — Ambulatory Visit (HOSPITAL_COMMUNITY): Payer: Medicare Other

## 2020-08-22 ENCOUNTER — Other Ambulatory Visit: Payer: Self-pay

## 2020-08-22 ENCOUNTER — Ambulatory Visit (INDEPENDENT_AMBULATORY_CARE_PROVIDER_SITE_OTHER): Payer: Medicare Other | Admitting: Urology

## 2020-08-22 DIAGNOSIS — N200 Calculus of kidney: Secondary | ICD-10-CM

## 2020-08-22 LAB — MICROSCOPIC EXAMINATION: WBC, UA: >30 /hpf — AB (ref 0–5)

## 2020-08-22 LAB — URINALYSIS, ROUTINE W REFLEX MICROSCOPIC
Bilirubin, UA: NEGATIVE
Ketones, UA: NEGATIVE
Nitrite, UA: NEGATIVE
Specific Gravity, UA: 1.025 (ref 1.005–1.030)
Urobilinogen, Ur: 0.2 mg/dL (ref 0.2–1.0)
pH, UA: 6.5 (ref 5.0–7.5)

## 2020-08-22 NOTE — Progress Notes (Signed)
Urological Symptom Review  Patient is experiencing the following symptoms: Frequent urination Hard to postpone urination Burning/pain with urination Get up at night to urinate Leakage of urine Stream starts and stops Urinary tract infection Kidney stones  Review of Systems  Gastrointestinal (upper)  : Indigestion/heartburn  Gastrointestinal (lower) : Negative for lower GI symptoms  Constitutional : Weight loss  Skin: Negative for skin symptoms  Eyes: Blurred vision Double vision  Ear/Nose/Throat : Negative for Ear/Nose/Throat symptoms  Hematologic/Lymphatic: Negative for Hematologic/Lymphatic symptoms  Cardiovascular : Negative for cardiovascular symptoms  Respiratory : Negative for respiratory symptoms  Endocrine: Negative for endocrine symptoms  Musculoskeletal: Back pain Joint pain  Neurological: Dizziness  Psychologic: Negative for psychiatric symptoms

## 2020-08-26 ENCOUNTER — Telehealth: Payer: Self-pay

## 2020-08-26 ENCOUNTER — Other Ambulatory Visit: Payer: Self-pay

## 2020-08-26 DIAGNOSIS — N390 Urinary tract infection, site not specified: Secondary | ICD-10-CM

## 2020-08-26 NOTE — Telephone Encounter (Signed)
Returned call no answer. Orders placd. Left message okay to come leave specimen

## 2020-08-26 NOTE — Telephone Encounter (Signed)
Patient is wanting a call back regarding her urine specimen. If she cannot answer please leave a leave a message.  Thanks, Helene Kelp

## 2020-08-27 ENCOUNTER — Ambulatory Visit (HOSPITAL_COMMUNITY): Payer: Medicare Other

## 2020-08-28 ENCOUNTER — Other Ambulatory Visit: Payer: Self-pay

## 2020-08-28 ENCOUNTER — Other Ambulatory Visit: Payer: Medicare Other

## 2020-08-28 DIAGNOSIS — N390 Urinary tract infection, site not specified: Secondary | ICD-10-CM

## 2020-08-28 DIAGNOSIS — N200 Calculus of kidney: Secondary | ICD-10-CM

## 2020-08-28 LAB — URINALYSIS, ROUTINE W REFLEX MICROSCOPIC
Bilirubin, UA: NEGATIVE
Glucose, UA: NEGATIVE
Ketones, UA: NEGATIVE
Nitrite, UA: NEGATIVE
Specific Gravity, UA: 1.025 (ref 1.005–1.030)
Urobilinogen, Ur: 0.2 mg/dL (ref 0.2–1.0)
pH, UA: 6 (ref 5.0–7.5)

## 2020-08-28 LAB — MICROSCOPIC EXAMINATION
RBC, Urine: >30 /hpf — AB (ref 0–2)
Renal Epithel, UA: NONE SEEN /hpf

## 2020-08-28 MED ORDER — NITROFURANTOIN MONOHYD MACRO 100 MG PO CAPS: 100.0000 mg | ORAL_CAPSULE | Freq: Two times a day (BID) | ORAL | 0 refills | Status: DC

## 2020-08-28 NOTE — Progress Notes (Unsigned)
Started on Macrobid

## 2020-08-30 LAB — URINE CULTURE

## 2020-09-01 NOTE — Progress Notes (Signed)
Sent via mail 

## 2020-09-02 ENCOUNTER — Other Ambulatory Visit: Payer: Self-pay

## 2020-09-02 ENCOUNTER — Ambulatory Visit (HOSPITAL_COMMUNITY)
Admission: RE | Admit: 2020-09-02 | Discharge: 2020-09-02 | Disposition: A | Discharge status: Home / Self Care | Payer: Medicare Other | Source: Ambulatory Visit | Attending: Urology | Admitting: Urology

## 2020-09-02 ENCOUNTER — Encounter: Payer: Self-pay | Admitting: Urology

## 2020-09-02 ENCOUNTER — Telehealth (INDEPENDENT_AMBULATORY_CARE_PROVIDER_SITE_OTHER): Payer: Medicare Other | Admitting: Urology

## 2020-09-02 DIAGNOSIS — N200 Calculus of kidney: Secondary | ICD-10-CM | POA: Insufficient documentation

## 2020-09-02 DIAGNOSIS — N390 Urinary tract infection, site not specified: Secondary | ICD-10-CM

## 2020-09-02 NOTE — Progress Notes (Signed)
Patient rescheduled

## 2020-09-02 NOTE — Patient Instructions (Signed)
Textbook of Natural Medicine (5th ed., pp. 1518-1527.e3). [place], [place]: Elsevier.">  Dietary Guidelines to Help Prevent Kidney Stones Kidney stones are deposits of minerals and salts that form inside your kidneys. Your risk of developing kidney stones may be greater depending on your diet, your lifestyle, the medicines you take, and whether you have certain medical conditions. Most people can lower their chances of developing kidney stones by following the instructions below. Your dietitian may give you more specific instructions depending on your overall health and the type of kidney stones youtend to develop. What are tips for following this plan? Reading food labels  Choose foods with "no salt added" or "low-salt" labels. Limit your salt (sodium) intake to less than 1,500 mg a day. Choose foods with calcium for each meal and snack. Try to eat about 300 mg of calcium at each meal. Foods that contain 200-500 mg of calcium a serving include: 8 oz (237 mL) of milk, calcium-fortifiednon-dairy milk, and calcium-fortifiedfruit juice. Calcium-fortified means that calcium has been added to these drinks. 8 oz (237 mL) of kefir, yogurt, and soy yogurt. 4 oz (114 g) of tofu. 1 oz (28 g) of cheese. 1 cup (150 g) of dried figs. 1 cup (91 g) of cooked broccoli. One 3 oz (85 g) can of sardines or mackerel. Most people need 1,000-1,500 mg of calcium a day. Talk to your dietitian abouthow much calcium is recommended for you. Shopping Buy plenty of fresh fruits and vegetables. Most people do not need to avoid fruits and vegetables, even if these foods contain nutrients that may contribute to kidney stones. When shopping for convenience foods, choose: Whole pieces of fruit. Pre-made salads with dressing on the side. Low-fat fruit and yogurt smoothies. Avoid buying frozen meals or prepared deli foods. These can be high in sodium. Look for foods with live cultures, such as yogurt and kefir. Choose high-fiber  grains, such as whole-wheat breads, oat bran, and wheat cereals. Cooking Do not add salt to food when cooking. Place a salt shaker on the table and allow each person to add his or her own salt to taste. Use vegetable protein, such as beans, textured vegetable protein (TVP), or tofu, instead of meat in pasta, casseroles, and soups. Meal planning Eat less salt, if told by your dietitian. To do this: Avoid eating processed or pre-made food. Avoid eating fast food. Eat less animal protein, including cheese, meat, poultry, or fish, if told by your dietitian. To do this: Limit the number of times you have meat, poultry, fish, or cheese each week. Eat a diet free of meat at least 2 days a week. Eat only one serving each day of meat, poultry, fish, or seafood. When you prepare animal protein, cut pieces into small portion sizes. For most meat and fish, one serving is about the size of the palm of your hand. Eat at least five servings of fresh fruits and vegetables each day. To do this: Keep fruits and vegetables on hand for snacks. Eat one piece of fruit or a handful of berries with breakfast. Have a salad and fruit at lunch. Have two kinds of vegetables at dinner. Limit foods that are high in a substance called oxalate. These include: Spinach (cooked), rhubarb, beets, sweet potatoes, and Swiss chard. Peanuts. Potato chips, french fries, and baked potatoes with skin on. Nuts and nut products. Chocolate. If you regularly take a diuretic medicine, make sure to eat at least 1 or 2 servings of fruits or vegetables that are   high in potassium each day. These include: Avocado. Banana. Orange, prune, carrot, or tomato juice. Baked potato. Cabbage. Beans and split peas. Lifestyle  Drink enough fluid to keep your urine pale yellow. This is the most important thing you can do. Spread your fluid intake throughout the day. If you drink alcohol: Limit how much you use to: 0-1 drink a day for women who  are not pregnant. 0-2 drinks a day for men. Be aware of how much alcohol is in your drink. In the [place] one drink equals one 12 oz bottle of beer (355 mL), one 5 oz glass of wine (148 mL), or one 1 oz glass of hard liquor (44 mL). Lose weight if told by your health care provider. Work with your dietitian to find an eating plan and weight loss strategies that work best for you.  General information Talk to your health care provider and dietitian about taking daily supplements. You may be told the following depending on your health and the cause of your kidney stones: Not to take supplements with vitamin C. To take a calcium supplement. To take a daily probiotic supplement. To take other supplements such as magnesium, fish oil, or vitamin B6. Take over-the-counter and prescription medicines only as told by your health care provider. These include supplements. What foods should I limit? Limit your intake of the following foods, or eat them as told by your dietitian. Vegetables Spinach. Rhubarb. Beets. Canned vegetables. Pickles. Olives. Baked potatoeswith skin. Grains Wheat bran. Baked goods. Salted crackers. Cereals high in sugar. Meats and other proteins Nuts. Nut butters. Large portions of meat, poultry, or fish. Salted, precooked,or cured meats, such as sausages, meat loaves, and hot dogs. Dairy Cheese. Beverages Regular soft drinks. Regular vegetable juice. Seasonings and condiments Seasoning blends with salt. Salad dressings. Soy sauce. Ketchup. Barbecue sauce. Other foods Canned soups. Canned pasta sauce. Casseroles. Pizza. Lasagna. Frozen meals.Potato chips. French fries. The items listed above may not be a complete list of foods and beverages you should limit. Contact a dietitian for more information. What foods should I avoid? Talk to your dietitian about specific foods you should avoid based on the typeof kidney stones you have and your overall health. Fruits Grapefruit. The  item listed above may not be a complete list of foods and beverages you should avoid. Contact a dietitian for more information. Summary Kidney stones are deposits of minerals and salts that form inside your kidneys. You can lower your risk of kidney stones by making changes to your diet. The most important thing you can do is drink enough fluid. Drink enough fluid to keep your urine pale yellow. Talk to your dietitian about how much calcium you should have each day, and eat less salt and animal protein as told by your dietitian. This information is not intended to replace advice given to you by your health care provider. Make sure you discuss any questions you have with your healthcare provider. Document Revised: 02/15/2019 Document Reviewed: 02/15/2019 Elsevier Patient Education  2022 Elsevier Inc.  

## 2020-09-02 NOTE — Progress Notes (Signed)
09/02/2020 2:50 PM   [name] 1950-05-08 [id number]  Referring provider: Fayrene Helper, MD [address] [place],  [place] [address]  Patient location: home Physician location: office I connected with  [name] on 09/02/20 by a video enabled telemedicine application and verified that I am speaking with the correct person using two identifiers.   I discussed the limitations of evaluation and management by telemedicine. The patient expressed understanding and agreed to proceed.    Nephrolithiasis  HPI: Ms Formoso is a 70yo here for followup for nephrolithiasis. She has intermittent left flank pain. She underwent US today which shows a left 1.8cm calculus and a lower pole right calculus. No hematuria or dysuria. She gets intermittent bladder spasms which last several minutes.    PMH: Past Medical History:  Diagnosis Date   BACK PAIN, THORACIC REGION, RIGHT 01/27/2010   Qualifier: Diagnosis of  By: Moshe Cipro MD, Jerrel Ivory of foot 07/15/2017   Essential hypertension    Headache(784.0)    Hematuria 02/24/2016   History of kidney stones    Hyperlipidemia    Irregular heart beats    Microscopic hematuria 02/24/2016   Neck pain on right side 01/23/2013   Piles (hemorrhoids) 06/22/2011   Renal calculus, right 05/31/2016   Shoulder pain, right 01/25/2014   SKIN TAG 06/13/2008   Qualifier: Diagnosis of  By: Moshe Cipro MD, Margaret     TIA (transient ischemic attack) 06/19/2016   Type 2 diabetes mellitus ([place])    Vaginitis     Surgical History: Past Surgical History:  Procedure Laterality Date   COLONOSCOPY N/A 10/26/2018   Procedure: COLONOSCOPY;  Surgeon: Rogene Houston, MD;  [facility];  Service: Endoscopy;  Laterality: N/A;  830-10:30am   cyst removed Right 1998   cyst- removed from right wrist   CYSTOSCOPY WITH RETROGRADE PYELOGRAM, URETEROSCOPY AND STENT PLACEMENT Right 05/19/2020   Procedure: CYSTOSCOPY WITH RETROGRADE  PYELOGRAM, URETEROSCOPY AND STENT PLACEMENT;  Surgeon: Cleon Gustin, MD;  Location: [facility];  Service: Urology;  Laterality: Right;   CYSTOSCOPY WITH RETROGRADE PYELOGRAM, URETEROSCOPY AND STENT PLACEMENT Right 06/26/2020   Procedure: CYSTOSCOPY WITH RETROGRADE PYELOGRAM, URETEROSCOPY AND STENT EXCHANGE;  Surgeon: Cleon Gustin, MD;  Location: [facility];  Service: Urology;  Laterality: Right;   EXTRACORPOREAL SHOCK WAVE LITHOTRIPSY Right 05/29/2018   Procedure: EXTRACORPOREAL SHOCK WAVE LITHOTRIPSY (ESWL);  Surgeon: Kathie Rhodes, MD;  [facility];  Service: Urology;  Laterality: Right;   HOLMIUM LASER APPLICATION Right 6/56/8127   Procedure: HOLMIUM LASER APPLICATION;  Surgeon: Cleon Gustin, MD;  Location: [facility];  Service: Urology;  Laterality: Right;   lithotrpsy     over 20 years   NEPHROLITHOTOMY Right 05/31/2016   Procedure: NEPHROLITHOTOMY PERCUTANEOUS WITH SURGEON ACCESS;  Surgeon: Cleon Gustin, MD;  [facility];  Service: Urology;  Laterality: Right;   POLYPECTOMY  10/26/2018   Procedure: POLYPECTOMY;  Surgeon: Rogene Houston, MD;  [facility];  Service: Endoscopy;;  colon   TOTAL ABDOMINAL HYSTERECTOMY W/ BILATERAL SALPINGOOPHORECTOMY  1998   TUBAL LIGATION      Home Medications:  Allergies as of 09/02/2020       Reactions   Onglyza [saxagliptin Hydrochloride] Swelling   Tongue swell   Pravastatin Other (See Comments)   Muscle aches   Ace Inhibitors Cough        Medication List        Accurate as of September 02, 2020  2:50 PM. If you have any questions, ask your nurse or doctor.          acetaminophen 500 MG tablet Commonly known as: TYLENOL Take 1,000 mg by mouth every 6 (six) hours as needed for headache or moderate pain.   aspirin EC 81 MG tablet Take 1 tablet (81 mg total) by mouth daily.   atorvastatin 10 MG tablet Commonly known as: LIPITOR Take one tablet by mouth every Monday, Wednesday and Friday, and take half  tablet , every Tuesday, Thursday, Saturday and Sunday What changed:  how much to take how to take this when to take this additional instructions   B-D ULTRAFINE III SHORT PEN 31G X 8 MM Misc Generic drug: Insulin Pen Needle FOR USE WITH INSULIN ONCE DAILY DX E11.9   clotrimazole-betamethasone cream Commonly known as: LOTRISONE Apply 1 application topically 2 (two) times daily.   ferrous sulfate 325 (65 FE) MG tablet Take 325 mg by mouth 2 (two) times daily with a meal.   hydroquinone 4 % cream Apply topically 2 (two) times daily.   Lantus SoloStar 100 UNIT/ML Solostar Pen Generic drug: insulin glargine INJECT 22 UNITS INTO THE SKIN DAILY AT 10 PM.   losartan 25 MG tablet Commonly known as: COZAAR Take 12.5 mg by mouth daily.   Lubricant Eye Drops 0.4-0.3 % Soln Generic drug: Polyethyl Glycol-Propyl Glycol Place 1 drop into both eyes 3 (three) times daily as needed (dry/irritated eyes.).   metFORMIN 1000 MG tablet Commonly known as: GLUCOPHAGE TAKE 1 TABLET (1,000 MG TOTAL) BY MOUTH 2 (TWO) TIMES DAILY WITH A MEAL.   nitrofurantoin (macrocrystal-monohydrate) 100 MG capsule Commonly known as: MACROBID Take 1 capsule (100 mg total) by mouth 2 (two) times daily.   ondansetron 4 MG tablet Commonly known as: Zofran Take 1 tablet (4 mg total) by mouth daily as needed for nausea or vomiting.   OneTouch Delica Lancets 66Z Misc Once daily dx e11.9 (give lancects that go with pts meter)   OneTouch Ultra test strip Generic drug: glucose blood USE TO TEST BLOOD SUGAR 3 TIMES A DAY AS DIRECTED DX E11.65   oxyCODONE-acetaminophen 5-325 MG tablet Commonly known as: Percocet Take 1 tablet by mouth every 4 (four) hours as needed.   trimethoprim 100 MG tablet Commonly known as: TRIMPEX Take 1 tablet (100 mg total) by mouth daily. To take nightly until you have surgery.   Vitamin D3 50 MCG (2000 UT) Tabs Take 2,000 Units by mouth daily.        Allergies:  Allergies   Allergen Reactions   Onglyza [Saxagliptin Hydrochloride] Swelling    Tongue swell   Pravastatin Other (See Comments)    Muscle aches   Ace Inhibitors Cough    Family History: Family History  Problem Relation Age of Onset   Alcohol abuse Mother    Esophageal cancer Father    Alcohol abuse Father    Diabetes Sister    Diabetes Sister    Hypertension Sister    Heart attack Sister        in 34's   Seizures Brother     Social History:  reports that she has never smoked. She has never used smokeless tobacco. She reports that she does not drink alcohol and does not use drugs.  ROS: All other review of systems were reviewed and are negative except what is noted above in HPI   Laboratory Data: Lab Results  Component Value Date   WBC 5.2 05/15/2020   HGB 12.3  05/15/2020   HCT 41.0 05/15/2020   MCV 94.3 05/15/2020   PLT 423 (H) 05/15/2020    Lab Results  Component Value Date   CREATININE 0.85 05/15/2020    No results found for: PSA  No results found for: TESTOSTERONE  Lab Results  Component Value Date   HGBA1C 7.1 (H) 05/15/2020    Urinalysis    Component Value Date/Time   COLORURINE YELLOW 05/15/2020 1243   APPEARANCEUR Cloudy (A) 08/28/2020 1512   LABSPEC 1.016 05/15/2020 1243   PHURINE 6.0 05/15/2020 1243   GLUCOSEU Negative 08/28/2020 1512   HGBUR LARGE (A) 05/15/2020 1243   BILIRUBINUR Negative 08/28/2020 1512   KETONESUR 5 (A) 05/15/2020 1243   PROTEINUR 3+ (A) 08/28/2020 1512   PROTEINUR 100 (A) 05/15/2020 1243   UROBILINOGEN 0.2 01/24/2020 0921   UROBILINOGEN 0.2 01/25/2014 0845   NITRITE Negative 08/28/2020 1512   NITRITE NEGATIVE 05/15/2020 1243   LEUKOCYTESUR 2+ (A) 08/28/2020 1512   LEUKOCYTESUR LARGE (A) 05/15/2020 1243    Lab Results  Component Value Date   LABMICR See below: 08/28/2020   WBCUA 11-30 (A) 08/28/2020   LABEPIT 0-10 08/28/2020   MUCUS Present 08/28/2020   BACTERIA Moderate (A) 08/28/2020    Pertinent Imaging: Rernal  [place] 09/02/2020: Images reviewed and discussed with the patient Results for orders placed during the hospital encounter of 04/10/20  Abdomen 1 view (KUB)  Narrative CLINICAL DATA:  History of nephrolithiasis  EXAM: ABDOMEN - 1 VIEW  COMPARISON:  Ultrasound from 02/14/2020  FINDINGS: Scattered large and small bowel gas is noted. Bilateral renal calculi are noted largest of these measures approximately 9.4 cm on the left and 1.7 cm on the right. No definitive ureteral stones are seen. No bony abnormality is noted.  IMPRESSION: Bilateral renal calculi as described.   Electronically Signed By: Inez Catalina M.D. On: 04/11/2020 02:04  No results found for this or any previous visit.  No results found for this or any previous visit.  No results found for this or any previous visit.  Results for orders placed during the hospital encounter of 02/14/20  US RENAL  Narrative CLINICAL DATA:  Initial evaluation for stage 2 chronic kidney disease.  EXAM: RENAL / URINARY TRACT ULTRASOUND COMPLETE  COMPARISON:  Prior CT from 04/20/2016.  FINDINGS: Right Kidney:  Renal measurements: 11.2 x 4.9 x 5.7 cm = volume: 162.3 mL. Renal echogenicity within normal limits. 1.5 cm stone seen positioned at the renal pelvis. Mild caliectasis seen proximally at the mid and upper pole. No frank hydronephrosis. No focal renal mass.  Left Kidney:  Renal measurements: 11.7 x 6.4 x 5.1 cm = volume: 199.0 mL. Renal echogenicity within normal limits. 1.3 cm nonobstructive stone present at the lower pole. Minimal caliectasis without hydronephrosis. No focal renal mass.  Bladder:  Appears normal for degree of bladder distention.  Other:  None.  IMPRESSION: 1. 1.5 cm stone positioned at the right renal pelvis. Secondary mild caliectasis at the mid and upper right kidney without frank hydronephrosis. 2. 1.3 cm nonobstructive stone at the lower pole of the left kidney. 3. Renal echogenicity  within normal limits.   Electronically Signed By: Jeannine Boga M.D. On: 02/14/2020 22:38  No results found for this or any previous visit.  No results found for this or any previous visit.  No results found for this or any previous visit.   Assessment & Plan:    1. Nephrolithiasis -We discussed the management of kidney stones. These options include observation,  ureteroscopy, shockwave lithotripsy (ESWL) and percutaneous nephrolithotomy (PCNL). We discussed which options are relevant to the patient's stone(s). We discussed the natural history of kidney stones as well as the complications of untreated stones and the impact on quality of life without treatment as well as with each of the above listed treatments. We also discussed the efficacy of each treatment in its ability to clear the stone burden. With any of these management options I discussed the signs and symptoms of infection and the need for emergent treatment should these be experienced. For each option we discussed the ability of each procedure to clear the patient of their stone burden.   For observation I described the risks which include but are not limited to silent renal damage, life-threatening infection, need for emergent surgery, failure to pass stone and pain.   For ureteroscopy I described the risks which include bleeding, infection, damage to contiguous structures, positioning injury, ureteral stricture, ureteral avulsion, ureteral injury, need for prolonged ureteral stent, inability to perform ureteroscopy, need for an interval procedure, inability to clear stone burden, stent discomfort/pain, heart attack, stroke, pulmonary embolus and the inherent risks with general anesthesia.   For shockwave lithotripsy I described the risks which include arrhythmia, kidney contusion, kidney hemorrhage, need for transfusion, pain, inability to adequately break up stone, inability to pass stone fragments, Steinstrasse, infection  associated with obstructing stones, need for alternate surgical procedure, need for repeat shockwave lithotripsy, MI, CVA, PE and the inherent risks with anesthesia/conscious sedation.   For PCNL I described the risks including positioning injury, pneumothorax, hydrothorax, need for chest tube, inability to clear stone burden, renal laceration, arterial venous fistula or malformation, need for embolization of kidney, loss of kidney or renal function, need for repeat procedure, need for prolonged nephrostomy tube, ureteral avulsion, MI, CVA, PE and the inherent risks of general anesthesia.   - The patient would like to proceed with observation. RTC 3 months with KUB  2. Recurrent UTI -Continue observation since she has had multiple negative urine cultures.   No follow-ups on file.  [name], MD  [facility] [place]

## 2020-10-28 ENCOUNTER — Ambulatory Visit (INDEPENDENT_AMBULATORY_CARE_PROVIDER_SITE_OTHER): Payer: Medicare Other | Admitting: Nurse Practitioner

## 2020-10-28 ENCOUNTER — Encounter: Payer: Self-pay | Admitting: Nurse Practitioner

## 2020-10-28 ENCOUNTER — Other Ambulatory Visit: Payer: Self-pay

## 2020-10-28 DIAGNOSIS — R300 Dysuria: Secondary | ICD-10-CM

## 2020-10-28 DIAGNOSIS — R319 Hematuria, unspecified: Secondary | ICD-10-CM

## 2020-10-28 DIAGNOSIS — N390 Urinary tract infection, site not specified: Secondary | ICD-10-CM | POA: Insufficient documentation

## 2020-10-28 LAB — POCT URINALYSIS DIPSTICK
Bilirubin, UA: NEGATIVE
Glucose, UA: NEGATIVE
Ketones, UA: NEGATIVE
Nitrite, UA: NEGATIVE
Protein, UA: POSITIVE — AB
Spec Grav, UA: 1.02 (ref 1.010–1.025)
Urobilinogen, UA: 0.2 E.U./dL
pH, UA: 6.5 (ref 5.0–8.0)

## 2020-10-28 MED ORDER — SULFAMETHOXAZOLE-TRIMETHOPRIM 800-160 MG PO TABS
1.0000 | ORAL_TABLET | Freq: Two times a day (BID) | ORAL | 0 refills | Status: DC
Start: 2020-10-28 — End: 2020-11-04

## 2020-10-28 MED ORDER — PHENAZOPYRIDINE HCL 97.2 MG PO TABS: 97.0000 mg | ORAL_TABLET | Freq: Three times a day (TID) | ORAL | 0 refills | Status: DC | PRN

## 2020-10-28 NOTE — Assessment & Plan Note (Addendum)
-  U/A today positive for blood and leuks; will send for culture -Rx. Bactrim and Azo

## 2020-10-28 NOTE — Progress Notes (Signed)
Acute Office Visit  Subjective:    Patient ID: [name], female    DOB: 1950/11/23, 70 y.o.   MRN: [id number]  Chief Complaint  Patient presents with   Dysuria    Onoging x2-3 weeks    Dysuria   Patient is in today for dysuria x 2-3 weeks. Symptoms per ROS.  Past Medical History:  Diagnosis Date   BACK PAIN, THORACIC REGION, RIGHT 01/27/2010   Qualifier: Diagnosis of  By: Moshe Cipro MD, Jerrel Ivory of foot 07/15/2017   Essential hypertension    Headache(784.0)    Hematuria 02/24/2016   History of kidney stones    Hyperlipidemia    Irregular heart beats    Microscopic hematuria 02/24/2016   Neck pain on right side 01/23/2013   Piles (hemorrhoids) 06/22/2011   Renal calculus, right 05/31/2016   Shoulder pain, right 01/25/2014   SKIN TAG 06/13/2008   Qualifier: Diagnosis of  By: Moshe Cipro MD, Margaret     TIA (transient ischemic attack) 06/19/2016   Type 2 diabetes mellitus ([place])    Vaginitis     Past Surgical History:  Procedure Laterality Date   COLONOSCOPY N/A 10/26/2018   Procedure: COLONOSCOPY;  Surgeon: Rogene Houston, MD;  [facility];  Service: Endoscopy;  Laterality: N/A;  830-10:30am   cyst removed Right 1998   cyst- removed from right wrist   CYSTOSCOPY WITH RETROGRADE PYELOGRAM, URETEROSCOPY AND STENT PLACEMENT Right 05/19/2020   Procedure: CYSTOSCOPY WITH RETROGRADE PYELOGRAM, URETEROSCOPY AND STENT PLACEMENT;  Surgeon: Cleon Gustin, MD;  Location: [facility];  Service: Urology;  Laterality: Right;   CYSTOSCOPY WITH RETROGRADE PYELOGRAM, URETEROSCOPY AND STENT PLACEMENT Right 06/26/2020   Procedure: CYSTOSCOPY WITH RETROGRADE PYELOGRAM, URETEROSCOPY AND STENT EXCHANGE;  Surgeon: Cleon Gustin, MD;  Location: [facility];  Service: Urology;  Laterality: Right;   EXTRACORPOREAL SHOCK WAVE LITHOTRIPSY Right 05/29/2018   Procedure: EXTRACORPOREAL SHOCK WAVE LITHOTRIPSY (ESWL);  Surgeon: Kathie Rhodes, MD;  [facility];  Service:  Urology;  Laterality: Right;   HOLMIUM LASER APPLICATION Right [id number]   Procedure: HOLMIUM LASER APPLICATION;  Surgeon: Cleon Gustin, MD;  Location: [facility];  Service: Urology;  Laterality: Right;   lithotrpsy     over 20 years   NEPHROLITHOTOMY Right 05/31/2016   Procedure: NEPHROLITHOTOMY PERCUTANEOUS WITH SURGEON ACCESS;  Surgeon: Cleon Gustin, MD;  Location[facility];  Service: Urology;  Laterality: Right;   POLYPECTOMY  10/26/2018   Procedure: POLYPECTOMY;  Surgeon: Rogene Houston, MD;  [facility];  Service: Endoscopy;;  colon   TOTAL ABDOMINAL HYSTERECTOMY W/ BILATERAL SALPINGOOPHORECTOMY  1998   TUBAL LIGATION      Family History  Problem Relation Age of Onset   Alcohol abuse Mother    Esophageal cancer Father    Alcohol abuse Father    Diabetes Sister    Diabetes Sister    Hypertension Sister    Heart attack Sister        in 89's   Seizures Brother     Social History   Socioeconomic History   Marital status: Single    Spouse name: Not on file   Number of children: 1   Years of education: Not on file   Highest education level: 12th grade  Occupational History   Occupation: employed   Tobacco Use   Smoking status: Never   Smokeless tobacco: Never  Vaping Use   Vaping Use: Never used  Substance and Sexual Activity  Alcohol use: No   Drug use: No   Sexual activity: Not Currently  Other Topics Concern   Not on file  Social History Narrative   Not on file   Social Determinants of Health   Financial Resource Strain: Medium Risk   Difficulty of Paying Living Expenses: Somewhat hard  Food Insecurity: No Food Insecurity   Worried About Running Out of Food in the Last Year: Never true   Ran Out of Food in the Last Year: Never true  Transportation Needs: No Transportation Needs   Lack of Transportation (Medical): No   Lack of Transportation (Non-Medical): No  Physical Activity: Insufficiently Active   Days of Exercise per Week: 3  days   Minutes of Exercise per Session: 30 min  Stress: No Stress Concern Present   Feeling of Stress : Only a little  Social Connections: Socially Isolated   Frequency of Communication with Friends and Family: Three times a week   Frequency of Social Gatherings with Friends and Family: Once a week   Attends Religious Services: Never   Marine scientist or Organizations: No   Attends Music therapist: Never   Marital Status: Separated  Intimate Partner Violence: Not At Risk   Fear of Current or Ex-Partner: No   Emotionally Abused: No   Physically Abused: No   Sexually Abused: No    Outpatient Medications Prior to Visit  Medication Sig Dispense Refill   acetaminophen (TYLENOL) 500 MG tablet Take 1,000 mg by mouth every 6 (six) hours as needed for headache or moderate pain.     aspirin EC 81 MG tablet Take 1 tablet (81 mg total) by mouth daily.     atorvastatin (LIPITOR) 10 MG tablet Take one tablet by mouth every Monday, Wednesday and Friday, and take half tablet , every Tuesday, Thursday, Saturday and Sunday (Patient taking differently: Take 5-10 mg by mouth See admin instructions. Take 10 mg by mouth every Monday, Wednesday and Friday, and take 5 mg every Tuesday, Thursday, Saturday and Sunday) 60 tablet 3   B-D ULTRAFINE III SHORT PEN 31G X 8 MM MISC FOR USE WITH INSULIN ONCE DAILY DX E11.9 100 each 5   Cholecalciferol (VITAMIN D3) 50 MCG (2000 UT) TABS Take 2,000 Units by mouth daily.     clotrimazole-betamethasone (LOTRISONE) cream Apply 1 application topically 2 (two) times daily. 45 g 1   ferrous sulfate 325 (65 FE) MG tablet Take 325 mg by mouth 2 (two) times daily with a meal.     hydroquinone 4 % cream Apply topically 2 (two) times daily. 28.35 g 0   LANTUS SOLOSTAR 100 UNIT/ML Solostar Pen INJECT 22 UNITS INTO THE SKIN DAILY AT 10 PM. 3 mL 5   losartan (COZAAR) 25 MG tablet Take 12.5 mg by mouth daily.     metFORMIN (GLUCOPHAGE) 1000 MG tablet TAKE 1 TABLET  (1,000 MG TOTAL) BY MOUTH 2 (TWO) TIMES DAILY WITH A MEAL. 180 tablet 1   ondansetron (ZOFRAN) 4 MG tablet Take 1 tablet (4 mg total) by mouth daily as needed for nausea or vomiting. 30 tablet 1   OneTouch Delica Lancets 99991111 MISC Once daily dx e11.9 (give lancects that go with pts meter) 100 each 5   ONETOUCH ULTRA test strip USE TO TEST BLOOD SUGAR 3 TIMES A DAY AS DIRECTED DX E11.65 100 strip 5   Polyethyl Glycol-Propyl Glycol (LUBRICANT EYE DROPS) 0.4-0.3 % SOLN Place 1 drop into both eyes 3 (three) times daily as needed (  dry/irritated eyes.).     nitrofurantoin, macrocrystal-monohydrate, (MACROBID) 100 MG capsule Take 1 capsule (100 mg total) by mouth 2 (two) times daily. (Patient not taking: Reported on 10/28/2020) 14 capsule 0   oxyCODONE-acetaminophen (PERCOCET) 5-325 MG tablet Take 1 tablet by mouth every 4 (four) hours as needed. (Patient not taking: Reported on 10/28/2020) 30 tablet 0   trimethoprim (TRIMPEX) 100 MG tablet Take 1 tablet (100 mg total) by mouth daily. To take nightly until you have surgery. (Patient not taking: Reported on 10/28/2020) 30 tablet 0   No facility-administered medications prior to visit.    Allergies  Allergen Reactions   Onglyza [Saxagliptin Hydrochloride] Swelling    Tongue swell   Pravastatin Other (See Comments)    Muscle aches   Ace Inhibitors Cough    Review of Systems  Genitourinary:  Positive for dysuria.      Objective:    Physical Exam  There were no vitals taken for this visit. Wt Readings from Last 3 Encounters:  06/17/20 129 lb 6.4 oz (58.7 kg)  06/05/20 130 lb (59 kg)  05/15/20 140 lb (63.5 kg)    Health Maintenance Due  Topic Date Due   Zoster Vaccines- Shingrix (1 of 2) Never done   COVID-19 Vaccine (4 - Booster for Pfizer series) 03/21/2020   INFLUENZA VACCINE  10/06/2020   OPHTHALMOLOGY EXAM  10/15/2020    There are no preventive care reminders to display for this patient.   Lab Results  Component Value Date   TSH  0.72 02/20/2018   Lab Results  Component Value Date   WBC 5.2 05/15/2020   HGB 12.3 05/15/2020   HCT 41.0 05/15/2020   MCV 94.3 05/15/2020   PLT 423 (H) 05/15/2020   Lab Results  Component Value Date   NA 136 05/15/2020   K 3.6 05/15/2020   CO2 25 05/15/2020   GLUCOSE 285 (H) 05/15/2020   BUN 18 05/15/2020   CREATININE 0.85 05/15/2020   BILITOT 0.6 02/04/2020   ALKPHOS 133 (H) 02/04/2020   AST 10 02/04/2020   ALT 8 02/04/2020   PROT 7.8 02/04/2020   ALBUMIN 4.9 (H) 02/04/2020   CALCIUM 9.7 05/15/2020   ANIONGAP 13 05/15/2020   Lab Results  Component Value Date   CHOL 203 (H) 02/04/2020   Lab Results  Component Value Date   HDL 81 02/04/2020   Lab Results  Component Value Date   LDLCALC 108 (H) 02/04/2020   Lab Results  Component Value Date   TRIG 78 02/04/2020   Lab Results  Component Value Date   CHOLHDL 2.5 02/04/2020   Lab Results  Component Value Date   HGBA1C 7.1 (H) 05/15/2020       Assessment & Plan:   Problem List Items Addressed This Visit   None Visit Diagnoses     Burning with urination    -  Primary   Relevant Medications   phenazopyridine (PYRIDIUM) 97 MG tablet   Dysuria       Relevant Medications   sulfamethoxazole-trimethoprim (BACTRIM DS) 800-160 MG tablet   phenazopyridine (PYRIDIUM) 97 MG tablet   Other Relevant Orders   POCT Urinalysis Dipstick   Urine Culture   Urinary tract infection with hematuria, site unspecified       Relevant Medications   sulfamethoxazole-trimethoprim (BACTRIM DS) 800-160 MG tablet   phenazopyridine (PYRIDIUM) 97 MG tablet        Meds ordered this encounter  Medications   sulfamethoxazole-trimethoprim (BACTRIM DS) 800-160 MG tablet  Sig: Take 1 tablet by mouth 2 (two) times daily.    Dispense:  10 tablet    Refill:  0   phenazopyridine (PYRIDIUM) 97 MG tablet    Sig: Take 1 tablet (97 mg total) by mouth 3 (three) times daily as needed for pain.    Dispense:  10 tablet    Refill:  0    Date:  10/28/2020   Location of Patient: Home Location of Provider: Office Consent was obtain for visit to be over via telehealth. I verified that I am speaking with the correct person using two identifiers.  I connected with  [name] on 10/28/20 via telephone and verified that I am speaking with the correct person using two identifiers.   I discussed the limitations of evaluation and management by telemedicine. The patient expressed understanding and agreed to proceed.  Time spent:6 minutes   Noreene Larsson, NP

## 2020-10-29 LAB — HEMOGLOBIN A1C
Est. average glucose Bld gHb Est-mCnc: 169 mg/dL
Hgb A1c MFr Bld: 7.5 % — ABNORMAL HIGH (ref 4.8–5.6)

## 2020-10-29 LAB — BMP8+EGFR
BUN/Creatinine Ratio: 20 (ref 12–28)
BUN: 14 mg/dL (ref 8–27)
CO2: 25 mmol/L (ref 20–29)
Calcium: 9.8 mg/dL (ref 8.7–10.3)
Chloride: 100 mmol/L (ref 96–106)
Creatinine, Ser: 0.69 mg/dL (ref 0.57–1.00)
Glucose: 126 mg/dL — ABNORMAL HIGH (ref 65–99)
Potassium: 4.2 mmol/L (ref 3.5–5.2)
Sodium: 144 mmol/L (ref 134–144)
eGFR: 93 mL/min/{1.73_m2} (ref >59)

## 2020-10-30 ENCOUNTER — Other Ambulatory Visit: Payer: Self-pay | Admitting: Nurse Practitioner

## 2020-10-30 DIAGNOSIS — N390 Urinary tract infection, site not specified: Secondary | ICD-10-CM

## 2020-10-30 LAB — URINE CULTURE

## 2020-10-30 NOTE — Progress Notes (Signed)
Urine culture didn't grow anything specific. Is she feeling better?

## 2020-10-30 NOTE — Progress Notes (Signed)
Since symptoms didn't resolve with abx, and there isn't anything to go off of from the urine culture, I am referring to urology to determine the source of your dysuria.

## 2020-10-31 NOTE — Progress Notes (Signed)
If she still has symptoms next week, we can get a repeat U/A and urine culture.

## 2020-11-04 ENCOUNTER — Ambulatory Visit (INDEPENDENT_AMBULATORY_CARE_PROVIDER_SITE_OTHER): Payer: Medicare Other | Admitting: Family Medicine

## 2020-11-04 ENCOUNTER — Encounter: Payer: Self-pay | Admitting: Family Medicine

## 2020-11-04 ENCOUNTER — Other Ambulatory Visit: Payer: Self-pay

## 2020-11-04 VITALS — BP 122/78 | HR 96 | Resp 16 | Ht 64.0 in | Wt 127.4 lb

## 2020-11-04 DIAGNOSIS — I10 Essential (primary) hypertension: Secondary | ICD-10-CM | POA: Diagnosis not present

## 2020-11-04 DIAGNOSIS — E559 Vitamin D deficiency, unspecified: Secondary | ICD-10-CM | POA: Diagnosis not present

## 2020-11-04 DIAGNOSIS — E1169 Type 2 diabetes mellitus with other specified complication: Secondary | ICD-10-CM

## 2020-11-04 DIAGNOSIS — E782 Mixed hyperlipidemia: Secondary | ICD-10-CM

## 2020-11-04 DIAGNOSIS — Z794 Long term (current) use of insulin: Secondary | ICD-10-CM

## 2020-11-04 NOTE — Patient Instructions (Addendum)
F/U Nov 23 or shortly after, glycoHb in office and flu vaccine, call if you need me sooner  Please get covid booster today if possible , or asap  Please get  both shingrix vaccines as soon as possible, starting in September  Fasting lipid, hepatic, TSH and vit D this week   Please schedule eye exam  Work on  improving blood sugar  Goal for fasting blood sugar ranges from 80 to 120 and 2 hours after any meal or at bedtime should be between 130 to 170. It is important that you exercise regularly at least 30 minutes 5 times a week. If you develop chest pain, have severe difficulty breathing, or feel very tired, stop exercising immediately and seek medical attention   Thanks for choosing [facility], we consider it a privelige to serve you.

## 2020-11-04 NOTE — Progress Notes (Signed)
[name]     MRN: [id number]      DOB: [birth year]   HPI Ms. Holder is here for follow up and re-evaluation of chronic medical conditions, medication management and review of any available recent lab and radiology data.  Preventive health is updated, specifically  Cancer screening and Immunization.   Questions or concerns regarding consultations or procedures which the PT has had in the interim are  addressed.Needs to return to both nephrology and to urology and dietitian for continued management, also of questions re  diet and management, she is advised to keep all appts  The PT denies any adverse reactions to current medications since the last visit.  There are no new concerns.  There are no specific complaints   ROS Denies recent fever or chills. Denies sinus pressure, nasal congestion, ear pain or sore throat. Denies chest congestion, productive cough or wheezing. Denies chest pains, palpitations and leg swelling Denies abdominal pain, nausea, vomiting,diarrhea or constipation.   Denies dysuria, frequency, hesitancy or incontinence. Denies joint pain, swelling and limitation in mobility. Denies headaches, seizures, numbness, or tingling. Denies depression, anxiety or insomnia. Denies skin break down or rash.   PE  BP 136/78   Pulse 96   Resp 16   Ht '5\' 4"'$  (1.626 m)   Wt 127 lb 6.4 oz (57.8 kg)   SpO2 96%   BMI 21.87 kg/m   Patient alert and oriented and in no cardiopulmonary distress.  HEENT: No facial asymmetry, EOMI,     Neck supple .  Chest: Clear to auscultation bilaterally.  CVS: S1, S2 no murmurs, no S3.Regular rate.  ABD: Soft non tender.   Ext: No edema  MS: Adequate ROM spine, shoulders, hips and knees.  Skin: Intact, no ulcerations or rash noted.  Psych: Good eye contact, normal affect. Memory intact not anxious or depressed appearing.  CNS: CN 2-12 intact, power,  normal throughout.no focal deficits noted.   Assessment &  Plan  Essential hypertension Controlled, no change in medication DASH diet and commitment to daily physical activity for a minimum of 30 minutes discussed and encouraged, as a part of hypertension management. The importance of attaining a healthy weight is also discussed.  BP/Weight 11/04/2020 06/26/2020 06/17/2020 06/05/2020 05/19/2020 05/15/2020 [id number]  Systolic BP 123XX123 123XX123 123456 - Q000111Q A999333 123XX123  Diastolic BP 78 64 70 - 77 96 77  Wt. (Lbs) 127.4 - 129.4 130 - 140 136  BMI 21.87 - 22.21 22.31 - 24.03 23.34       Hyperlipidemia Hyperlipidemia:Low fat diet discussed and encouraged.   Lipid Panel  Lab Results  Component Value Date   CHOL 203 (H) 02/04/2020   HDL 81 02/04/2020   [place] 108 (H) 02/04/2020   TRIG 78 02/04/2020   CHOLHDL 2.5 02/04/2020     Not at goal needs updated lab asap  Type 2 diabetes mellitus with other specified complication ([place]) Controlled but not as well as earlier this year, needs diabetic education states struggles wih food choice Ms. Bossie is reminded of the importance of commitment to daily physical activity for 30 minutes or more, as able and the need to limit carbohydrate intake to 30 to 60 grams per meal to help with blood sugar control.   The need to take medication as prescribed, test blood sugar as directed, and to call between visits if there is a concern that blood sugar is uncontrolled is also discussed.   Ms. Ahmann is reminded of the importance of daily foot  exam, annual eye examination, and good blood sugar, blood pressure and cholesterol control.  Diabetic Labs Latest Ref Rng & Units 10/28/2020 05/15/2020 02/04/2020 12/04/2019 10/18/2019  HbA1c 4.8 - 5.6 % 7.5(H) 7.1(H) 7.7(H) - 7.9(A)  Microalbumin Not Estab. ug/mL - - - 304.0(H) -  Micro/Creat Ratio <30 mcg/mg creat - - - - -  Chol 100 - 199 mg/dL - - 203(H) - -  HDL >39 mg/dL - - 81 - -  Calc LDL 0 - 99 mg/dL - - 108(H) - -  Triglycerides 0 - 149 mg/dL - - 78 - -  Creatinine 0.57 -  1.00 mg/dL 0.69 0.85 0.82 - -   BP/Weight 11/04/2020 06/26/2020 06/17/2020 06/05/2020 05/19/2020 05/15/2020 [id number]  Systolic BP 123XX123 123XX123 123456 - Q000111Q A999333 123XX123  Diastolic BP 78 64 70 - 77 96 77  Wt. (Lbs) 127.4 - 129.4 130 - 140 136  BMI 21.87 - 22.21 22.31 - 24.03 23.34   Foot/eye exam completion dates Latest Ref Rng & Units 06/17/2020 10/16/2019  Eye Exam No Retinopathy - No Retinopathy  Foot exam Order - - -  Foot Form Completion - Done -

## 2020-11-04 NOTE — Progress Notes (Signed)
[name]     MRN: [id number]      DOB: [birth year]   HPI [name] is here for follow up and re-evaluation of chronic medical conditions, medication management and review of any available recent lab and radiology data.  Preventive health is updated, specifically  Cancer screening and Immunization.   Questions or concerns regarding consultations or procedures which the PT has had in the interim are  addressed. The PT denies any adverse reactions to current medications since the last visit.  There are no new concerns.  There are no specific complaints   ROS Denies recent fever or chills. Denies sinus pressure, nasal congestion, ear pain or sore throat. Denies chest congestion, productive cough or wheezing. Denies chest pains, palpitations and leg swelling Denies abdominal pain, nausea, vomiting,diarrhea or constipation.   Denies dysuria, frequency, hesitancy or incontinence. Denies joint pain, swelling and limitation in mobility. Denies headaches, seizures, numbness, or tingling. Denies depression, anxiety or insomnia. Denies skin break down or rash.   PE  BP 136/78   Pulse 96   Resp 16   Ht '5\' 4"'$  (1.626 m)   Wt 127 lb 6.4 oz (57.8 kg)   SpO2 96%   BMI 21.87 kg/m   Patient alert and oriented and in no cardiopulmonary distress.  HEENT: No facial asymmetry, EOMI,     Neck supple .  Chest: Clear to auscultation bilaterally.  CVS: S1, S2 no murmurs, no S3.Regular rate.  ABD: Soft non tender.   Ext: No edema  MS: Adequate ROM spine, shoulders, hips and knees.  Skin: Intact, no ulcerations or rash noted.  Psych: Good eye contact, normal affect. Memory intact not anxious or depressed appearing.  CNS: CN 2-12 intact, power,  normal throughout.no focal deficits noted.   Assessment & Plan  Essential hypertension Controlled, no change in medication DASH diet and commitment to daily physical activity for a minimum of 30 minutes discussed and encouraged, as a  part of hypertension management. The importance of attaining a healthy weight is also discussed.  BP/Weight 11/04/2020 06/26/2020 06/17/2020 06/05/2020 05/19/2020 05/15/2020 [id number]  Systolic BP 123XX123 123XX123 123456 - Q000111Q A999333 123XX123  Diastolic BP 78 64 70 - 77 96 77  Wt. (Lbs) 127.4 - 129.4 130 - 140 136  BMI 21.87 - 22.21 22.31 - 24.03 23.34       Hyperlipidemia Hyperlipidemia:Low fat diet discussed and encouraged.   Lipid Panel  Lab Results  Component Value Date   CHOL 203 (H) 02/04/2020   HDL 81 02/04/2020   [place] 108 (H) 02/04/2020   TRIG 78 02/04/2020   CHOLHDL 2.5 02/04/2020     Not at goal needs updated lab asap  Type 2 diabetes mellitus with other specified complication ([place]) Controlled but not as well as earlier this year, needs diabetic education states struggles wih food choice [name] is reminded of the importance of commitment to daily physical activity for 30 minutes or more, as able and the need to limit carbohydrate intake to 30 to 60 grams per meal to help with blood sugar control.   The need to take medication as prescribed, test blood sugar as directed, and to call between visits if there is a concern that blood sugar is uncontrolled is also discussed.   [name] is reminded of the importance of daily foot exam, annual eye examination, and good blood sugar, blood pressure and cholesterol control.  Diabetic Labs Latest Ref Rng & Units 10/28/2020 05/15/2020 02/04/2020 12/04/2019 10/18/2019  HbA1c 4.8 -  5.6 % 7.5(H) 7.1(H) 7.7(H) - 7.9(A)  Microalbumin Not Estab. ug/mL - - - 304.0(H) -  Micro/Creat Ratio <30 mcg/mg creat - - - - -  Chol 100 - 199 mg/dL - - 203(H) - -  HDL >39 mg/dL - - 81 - -  Calc LDL 0 - 99 mg/dL - - 108(H) - -  Triglycerides 0 - 149 mg/dL - - 78 - -  Creatinine 0.57 - 1.00 mg/dL 0.69 0.85 0.82 - -   BP/Weight 11/04/2020 06/26/2020 06/17/2020 06/05/2020 05/19/2020 05/15/2020 [id number]  Systolic BP 123XX123 123XX123 123456 - Q000111Q A999333 123XX123  Diastolic BP 78 64 70 - 77  96 77  Wt. (Lbs) 127.4 - 129.4 130 - 140 136  BMI 21.87 - 22.21 22.31 - 24.03 23.34   Foot/eye exam completion dates Latest Ref Rng & Units 06/17/2020 10/16/2019  Eye Exam No Retinopathy - No Retinopathy  Foot exam Order - - -  Foot Form Completion - Done -

## 2020-11-04 NOTE — Assessment & Plan Note (Signed)
Hyperlipidemia:Low fat diet discussed and encouraged.   Lipid Panel  Lab Results  Component Value Date   CHOL 203 (H) 02/04/2020   HDL 81 02/04/2020   [place]C 108 (H) 02/04/2020   TRIG 78 02/04/2020   CHOLHDL 2.5 02/04/2020     Not at goal needs updated lab asap

## 2020-11-04 NOTE — Assessment & Plan Note (Signed)
Controlled, no change in medication DASH diet and commitment to daily physical activity for a minimum of 30 minutes discussed and encouraged, as a part of hypertension management. The importance of attaining a healthy weight is also discussed.  BP/Weight 11/04/2020 06/26/2020 06/17/2020 06/05/2020 05/19/2020 05/15/2020 [id number]  Systolic BP 123XX123 123XX123 123456 - Q000111Q A999333 123XX123  Diastolic BP 78 64 70 - 77 96 77  Wt. (Lbs) 127.4 - 129.4 130 - 140 136  BMI 21.87 - 22.21 22.31 - 24.03 23.34

## 2020-11-04 NOTE — Assessment & Plan Note (Signed)
Controlled but not as well as earlier this year, needs diabetic education states struggles wih food choice [name] is reminded of the importance of commitment to daily physical activity for 30 minutes or more, as able and the need to limit carbohydrate intake to 30 to 60 grams per meal to help with blood sugar control.   The need to take medication as prescribed, test blood sugar as directed, and to call between visits if there is a concern that blood sugar is uncontrolled is also discussed.   [name] is reminded of the importance of daily foot exam, annual eye examination, and good blood sugar, blood pressure and cholesterol control.  Diabetic Labs Latest Ref Rng & Units 10/28/2020 05/15/2020 02/04/2020 12/04/2019 10/18/2019  HbA1c 4.8 - 5.6 % 7.5(H) 7.1(H) 7.7(H) - 7.9(A)  Microalbumin Not Estab. ug/mL - - - 304.0(H) -  Micro/Creat Ratio <30 mcg/mg creat - - - - -  Chol 100 - 199 mg/dL - - 203(H) - -  HDL >39 mg/dL - - 81 - -  Calc LDL 0 - 99 mg/dL - - 108(H) - -  Triglycerides 0 - 149 mg/dL - - 78 - -  Creatinine 0.57 - 1.00 mg/dL 0.69 0.85 0.82 - -   BP/Weight 11/04/2020 06/26/2020 06/17/2020 06/05/2020 05/19/2020 05/15/2020 [id number]  Systolic BP 123XX123 123XX123 123456 - Q000111Q A999333 123XX123  Diastolic BP 78 64 70 - 77 96 77  Wt. (Lbs) 127.4 - 129.4 130 - 140 136  BMI 21.87 - 22.21 22.31 - 24.03 23.34   Foot/eye exam completion dates Latest Ref Rng & Units 06/17/2020 10/16/2019  Eye Exam No Retinopathy - No Retinopathy  Foot exam Order - - -  Foot Form Completion - Done -

## 2020-11-11 ENCOUNTER — Other Ambulatory Visit: Payer: Self-pay

## 2020-11-11 ENCOUNTER — Ambulatory Visit (INDEPENDENT_AMBULATORY_CARE_PROVIDER_SITE_OTHER): Payer: Medicare Other

## 2020-11-11 DIAGNOSIS — N3001 Acute cystitis with hematuria: Secondary | ICD-10-CM | POA: Diagnosis not present

## 2020-11-11 LAB — POCT URINALYSIS DIP (CLINITEK)
Bilirubin, UA: NEGATIVE
Glucose, UA: 100 mg/dL — AB
Ketones, POC UA: NEGATIVE mg/dL
Nitrite, UA: NEGATIVE
POC PROTEIN,UA: 100 — AB
Spec Grav, UA: 1.02 (ref 1.010–1.025)
Urobilinogen, UA: 0.2 E.U./dL
pH, UA: 5.5 (ref 5.0–8.0)

## 2020-11-11 NOTE — Progress Notes (Signed)
Would you please put her down for a phone visit for tomorrow, and order a urine culture.

## 2020-11-13 ENCOUNTER — Ambulatory Visit (INDEPENDENT_AMBULATORY_CARE_PROVIDER_SITE_OTHER): Payer: Medicare Other | Admitting: Family Medicine

## 2020-11-13 ENCOUNTER — Other Ambulatory Visit: Payer: Self-pay

## 2020-11-13 DIAGNOSIS — N3090 Cystitis, unspecified without hematuria: Secondary | ICD-10-CM | POA: Diagnosis not present

## 2020-11-13 DIAGNOSIS — I10 Essential (primary) hypertension: Secondary | ICD-10-CM | POA: Diagnosis not present

## 2020-11-13 LAB — URINE CULTURE

## 2020-11-13 MED ORDER — AMPICILLIN 500 MG PO CAPS: 500.0000 mg | ORAL_CAPSULE | Freq: Three times a day (TID) | ORAL | 0 refills | Status: DC

## 2020-11-13 MED ORDER — LOSARTAN POTASSIUM 25 MG PO TABS: ORAL_TABLET | ORAL | 3 refills | Status: DC

## 2020-11-13 NOTE — Progress Notes (Signed)
She sees urology for recurrent UTIs. Can you set her up for a work-in tele-appointment today?

## 2020-11-13 NOTE — Patient Instructions (Signed)
Follow-up as before call if you need me sooner.  1 week of ampicillin as prescribed take as directed 3 times daily.  Resubmit urine for culture on Friday, September 16 please.    Ensure you drink 64 ounces of water daily and empty your bladder often.  Losartan is prescribed as we discussed.  Thanks for choosing [facility], we consider it a privelige to serve you.

## 2020-11-16 ENCOUNTER — Encounter: Payer: Self-pay | Admitting: Family Medicine

## 2020-11-16 NOTE — Assessment & Plan Note (Addendum)
Controlled,losartan added for renal protection,

## 2020-11-16 NOTE — Assessment & Plan Note (Signed)
Symptomatic, recent culture reveals no specific organism, ampicillin empirically and re culture in 1 week. Encouraged to push fluids

## 2020-11-16 NOTE — Progress Notes (Signed)
   Marjon Ritacco     MRN: [id number]      DOB: 1950-06-20   HPI Ms. Dew is here with c/o dysuria and frequency  for past 2 weeks, recent c/s from 11/11/2020 shows mixed flora between 50 to 100,000 CU she denies flank pain , fever, chills , nausea or vomiting  ROS . Denies sinus pressure, nasal congestion, ear pain or sore throat. Denies chest congestion, productive cough or wheezing. Denies chest pains, palpitations and leg swelling Denies abdominal pain, nausea, vomiting,diarrhea or constipation.    Denies joint pain, swelling and limitation in mobility. Denies headaches, seizures, numbness, or tingling. Denies depression, anxiety or insomnia. Denies skin break down or rash.   PE  BP 128/82   Pulse 76   Resp 14   Ht '5\' 4"'$  (1.626 m)   Wt 128 lb (58.1 kg)   BMI 21.97 kg/m    Patient alert and oriented and in no cardiopulmonary distress.  HEENT: No facial asymmetry, EOMI,     Neck supple .  Chest: Clear to auscultation bilaterally.  CVS: S1, S2 no murmurs, no S3.Regular rate.  ABD: Soft no renal angle tenderness  Ext: No edema  MS: Adequate ROM spine, shoulders, hips and knees.  Skin: Intact, no ulcerations or rash noted.  Psych: Good eye contact, normal affect. Memory intact not anxious or depressed appearing.  CNS: CN 2-12 intact, power,  normal throughout.no focal deficits noted.   Assessment & Plan  Cystitis Symptomatic, recent culture reveals no specific organism, ampicillin empirically and re culture in 1 week. Encouraged to push fluids  Essential hypertension Controlled, no change in medication

## 2020-11-17 ENCOUNTER — Other Ambulatory Visit: Payer: Self-pay

## 2020-11-17 MED ORDER — METFORMIN HCL 1000 MG PO TABS: 1000.0000 mg | ORAL_TABLET | Freq: Two times a day (BID) | ORAL | 1 refills | Status: DC

## 2020-12-01 ENCOUNTER — Ambulatory Visit: Payer: Medicare Other | Admitting: Urology

## 2020-12-04 ENCOUNTER — Ambulatory Visit (HOSPITAL_COMMUNITY)
Admission: RE | Admit: 2020-12-04 | Discharge: 2020-12-04 | Disposition: A | Discharge status: Home / Self Care | Payer: Medicare Other | Source: Ambulatory Visit | Attending: Urology | Admitting: Urology

## 2020-12-04 ENCOUNTER — Other Ambulatory Visit: Payer: Self-pay

## 2020-12-04 DIAGNOSIS — N200 Calculus of kidney: Secondary | ICD-10-CM | POA: Insufficient documentation

## 2020-12-09 ENCOUNTER — Ambulatory Visit (INDEPENDENT_AMBULATORY_CARE_PROVIDER_SITE_OTHER): Payer: Medicare Other | Admitting: Urology

## 2020-12-09 ENCOUNTER — Other Ambulatory Visit: Payer: Self-pay

## 2020-12-09 ENCOUNTER — Encounter: Payer: Self-pay | Admitting: Urology

## 2020-12-09 DIAGNOSIS — N200 Calculus of kidney: Secondary | ICD-10-CM | POA: Diagnosis not present

## 2020-12-09 DIAGNOSIS — N390 Urinary tract infection, site not specified: Secondary | ICD-10-CM | POA: Diagnosis not present

## 2020-12-09 MED ORDER — NITROFURANTOIN MONOHYD MACRO 100 MG PO CAPS: 100.0000 mg | ORAL_CAPSULE | Freq: Two times a day (BID) | ORAL | 0 refills | Status: DC

## 2020-12-09 NOTE — Progress Notes (Signed)
12/09/2020 3:57 PM   [name] September 30, 1950 [id number]  Referring provider: Fayrene Helper, MD [address] [place],  [place] [address]  Patient location: home Physician location: office I connected with  [name] on 12/09/20 by a video enabled telemedicine application and verified that I am speaking with the correct person using two identifiers.   I discussed the limitations of evaluation and management by telemedicine. The patient expressed understanding and agreed to proceed.    Followup nephrolithiasis   HPI: [name] is a 70yo here for followup for nephrolithiasis and recurrent UTI. She underwent KUB from 9/30 shows a 1cm left UPJ calculus. She denies any flank pain currently. She has urinary frequency, urgency and occasional dysuria. No recent UTI   PMH: Past Medical History:  Diagnosis Date   BACK PAIN, THORACIC REGION, RIGHT 01/27/2010   Qualifier: Diagnosis of  By: Moshe Cipro MD, Jerrel Ivory of foot 07/15/2017   Essential hypertension    Headache(784.0)    Hematuria 02/24/2016   History of kidney stones    Hyperlipidemia    Irregular heart beats    Microscopic hematuria 02/24/2016   Neck pain on right side 01/23/2013   Piles (hemorrhoids) 06/22/2011   Renal calculus, right 05/31/2016   Shoulder pain, right 01/25/2014   SKIN TAG 06/13/2008   Qualifier: Diagnosis of  By: Moshe Cipro MD, Margaret     TIA (transient ischemic attack) 06/19/2016   Type 2 diabetes mellitus ([place])    Vaginitis     Surgical History: Past Surgical History:  Procedure Laterality Date   COLONOSCOPY N/A 10/26/2018   Procedure: COLONOSCOPY;  Surgeon: Rogene Houston, MD;  [facility];  Service: Endoscopy;  Laterality: N/A;  830-10:30am   cyst removed Right 1998   cyst- removed from right wrist   CYSTOSCOPY WITH RETROGRADE PYELOGRAM, URETEROSCOPY AND STENT PLACEMENT Right 05/19/2020   Procedure: CYSTOSCOPY WITH RETROGRADE PYELOGRAM, URETEROSCOPY AND  STENT PLACEMENT;  Surgeon: Cleon Gustin, MD;  Location: [facility];  Service: Urology;  Laterality: Right;   CYSTOSCOPY WITH RETROGRADE PYELOGRAM, URETEROSCOPY AND STENT PLACEMENT Right 06/26/2020   Procedure: CYSTOSCOPY WITH RETROGRADE PYELOGRAM, URETEROSCOPY AND STENT EXCHANGE;  Surgeon: Cleon Gustin, MD;  Location: [facility];  Service: Urology;  Laterality: Right;   EXTRACORPOREAL SHOCK WAVE LITHOTRIPSY Right 05/29/2018   Procedure: EXTRACORPOREAL SHOCK WAVE LITHOTRIPSY (ESWL);  Surgeon: Kathie Rhodes, MD;  Location: [facility];  Service: Urology;  Laterality: Right;   HOLMIUM LASER APPLICATION Right 7/84/6962   Procedure: HOLMIUM LASER APPLICATION;  Surgeon: Cleon Gustin, MD;  Location: [facility];  Service: Urology;  Laterality: Right;   lithotrpsy     over 20 years   NEPHROLITHOTOMY Right 05/31/2016   Procedure: NEPHROLITHOTOMY PERCUTANEOUS WITH SURGEON ACCESS;  Surgeon: Cleon Gustin, MD;  Location: [facility];  Service: Urology;  Laterality: Right;   POLYPECTOMY  10/26/2018   Procedure: POLYPECTOMY;  Surgeon: Rogene Houston, MD;  [facility];  Service: Endoscopy;;  colon   TOTAL ABDOMINAL HYSTERECTOMY W/ BILATERAL SALPINGOOPHORECTOMY  1998   TUBAL LIGATION      Home Medications:  Allergies as of 12/09/2020       Reactions   Onglyza [saxagliptin Hydrochloride] Swelling   Tongue swell   Pravastatin Other (See Comments)   Muscle aches   Ace Inhibitors Cough        Medication List        Accurate as of December 09, 2020  3:57 PM.  If you have any questions, ask your nurse or doctor.          acetaminophen 500 MG tablet Commonly known as: TYLENOL Take 1,000 mg by mouth every 6 (six) hours as needed for headache or moderate pain.   ampicillin 500 MG capsule Commonly known as: PRINCIPEN Take 1 capsule (500 mg total) by mouth 3 (three) times daily.   aspirin EC 81 MG tablet Take 1 tablet (81 mg total) by mouth daily.   atorvastatin 10 MG  tablet Commonly known as: LIPITOR Take one tablet by mouth every Monday, Wednesday and Friday, and take half tablet , every Tuesday, Thursday, Saturday and Sunday What changed:  how much to take how to take this when to take this additional instructions   B-D ULTRAFINE III SHORT PEN 31G X 8 MM Misc Generic drug: Insulin Pen Needle FOR USE WITH INSULIN ONCE DAILY DX E11.9   clotrimazole-betamethasone cream Commonly known as: LOTRISONE Apply 1 application topically 2 (two) times daily.   ferrous sulfate 325 (65 FE) MG tablet Take 325 mg by mouth 2 (two) times daily with a meal.   hydroquinone 4 % cream Apply topically 2 (two) times daily.   Lantus SoloStar 100 UNIT/ML Solostar Pen Generic drug: insulin glargine INJECT 22 UNITS INTO THE SKIN DAILY AT 10 PM.   losartan 25 MG tablet Commonly known as: COZAAR Take half tablet once daily by mouth   Lubricant Eye Drops 0.4-0.3 % Soln Generic drug: Polyethyl Glycol-Propyl Glycol Place 1 drop into both eyes 3 (three) times daily as needed (dry/irritated eyes.).   metFORMIN 1000 MG tablet Commonly known as: GLUCOPHAGE Take 1 tablet (1,000 mg total) by mouth 2 (two) times daily with a meal.   OneTouch Delica Lancets 03J Misc Once daily dx e11.9 (give lancects that go with pts meter)   OneTouch Ultra test strip Generic drug: glucose blood USE TO TEST BLOOD SUGAR 3 TIMES A DAY AS DIRECTED DX E11.65   Vitamin D3 50 MCG (2000 UT) Tabs Take 2,000 Units by mouth daily.        Allergies:  Allergies  Allergen Reactions   Onglyza [Saxagliptin Hydrochloride] Swelling    Tongue swell   Pravastatin Other (See Comments)    Muscle aches   Ace Inhibitors Cough    Family History: Family History  Problem Relation Age of Onset   Alcohol abuse Mother    Esophageal cancer Father    Alcohol abuse Father    Diabetes Sister    Diabetes Sister    Hypertension Sister    Heart attack Sister        in 83's   Seizures Brother      Social History:  reports that she has never smoked. She has never used smokeless tobacco. She reports that she does not drink alcohol and does not use drugs.  ROS: All other review of systems were reviewed and are negative except what is noted above in HPI   Laboratory Data: Lab Results  Component Value Date   WBC 5.2 05/15/2020   HGB 12.3 05/15/2020   HCT 41.0 05/15/2020   MCV 94.3 05/15/2020   PLT 423 (H) 05/15/2020    Lab Results  Component Value Date   CREATININE 0.69 10/28/2020    No results found for: PSA  No results found for: TESTOSTERONE  Lab Results  Component Value Date   HGBA1C 7.5 (H) 10/28/2020    Urinalysis    Component Value Date/Time   COLORURINE YELLOW 05/15/2020 1243  APPEARANCEUR Cloudy (A) 08/28/2020 1512   LABSPEC 1.016 05/15/2020 1243   PHURINE 6.0 05/15/2020 1243   GLUCOSEU Negative 08/28/2020 1512   HGBUR LARGE (A) 05/15/2020 1243   BILIRUBINUR negative 11/11/2020 1551   BILIRUBINUR negative 10/28/2020 1134   BILIRUBINUR Negative 08/28/2020 1512   KETONESUR negative 11/11/2020 1551   KETONESUR 5 (A) 05/15/2020 1243   PROTEINUR Positive (A) 10/28/2020 1134   PROTEINUR 3+ (A) 08/28/2020 1512   PROTEINUR 100 (A) 05/15/2020 1243   UROBILINOGEN 0.2 11/11/2020 1551   UROBILINOGEN 0.2 01/25/2014 0845   NITRITE Negative 11/11/2020 1551   NITRITE negative 10/28/2020 1134   NITRITE Negative 08/28/2020 1512   NITRITE NEGATIVE 05/15/2020 1243   LEUKOCYTESUR Large (3+) (A) 11/11/2020 1551   LEUKOCYTESUR 2+ (A) 08/28/2020 1512   LEUKOCYTESUR LARGE (A) 05/15/2020 1243    Lab Results  Component Value Date   LABMICR See below: 08/28/2020   WBCUA 11-30 (A) 08/28/2020   LABEPIT 0-10 08/28/2020   MUCUS Present 08/28/2020   BACTERIA Moderate (A) 08/28/2020    Pertinent Imaging: KUB 9/30: Images reviewed and discussed with the patient  Results for orders placed during the hospital encounter of 12/04/20  Abdomen 1 view  (KUB)  Narrative CLINICAL DATA:  History of nephrolithiasis  EXAM: ABDOMEN - 1 VIEW  COMPARISON:  Ultrasound from 09/02/2020  FINDINGS: Scattered large and small bowel gas is noted. The kidneys are somewhat obscured by overlying fecal material. 6 mm calcification is noted over the lower pole of the left kidney relatively stable from the prior exam. There is a triangular shaped calcification measuring 18 mm in the expected region of the proximal left ureter. The right kidney is obscured.  IMPRESSION: Left lower pole and possible left renal pelvic/proximal ureteral stone. CT urography may be helpful for further evaluation.   Electronically Signed By: Inez Catalina M.D. On: 12/06/2020 03:44  No results found for this or any previous visit.  No results found for this or any previous visit.  No results found for this or any previous visit.  Results for orders placed during the hospital encounter of 09/02/20  Ultrasound renal complete  Narrative CLINICAL DATA:  Nephrolithiasis  EXAM: RENAL / URINARY TRACT ULTRASOUND COMPLETE  COMPARISON:  July 05, 2019 February 14, 2020  FINDINGS: Right Kidney:  Renal measurements: 10.5 x 5.0 x 6.2 cm = volume: 168 mL. Echogenicity within normal limits. There is decreased pelviectasis in comparison to prior. No hydronephrosis. Mild prominence of the proximal ureter, similar in comparison prior. Previously described nephrolithiasis within the renal pelvis is no longer visualized within the pelvis. There is a 1.4 cm nephrolithiasis within the inferior pole of the RIGHT kidney.  Left Kidney:  Renal measurements: 11.4 x 7.5 x 5.2 cm = volume: 231 mL. Echogenicity within normal limits. No hydronephrosis. There is a 19 mm nephrolithiasis within the inferior pole of the LEFT kidney.  Bladder:  Appears normal for degree of bladder distention.  Other:  None.  IMPRESSION: 1. Decreased pelviectasis of the RIGHT kidney in  comparison to prior. No frank hydronephrosis. 2. There are bilateral nephrolithiasis.   Electronically Signed By: Valentino Saxon MD On: 09/03/2020 10:30  No results found for this or any previous visit.  No results found for this or any previous visit.  No results found for this or any previous visit.   Assessment & Plan:    1. Nephrolithiasis -We discussed the management of kidney stones. These options include observation, ureteroscopy, shockwave lithotripsy (ESWL) and percutaneous nephrolithotomy (  PCNL). We discussed which options are relevant to the patient's stone(s). We discussed the natural history of kidney stones as well as the complications of untreated stones and the impact on quality of life without treatment as well as with each of the above listed treatments. We also discussed the efficacy of each treatment in its ability to clear the stone burden. With any of these management options I discussed the signs and symptoms of infection and the need for emergent treatment should these be experienced. For each option we discussed the ability of each procedure to clear the patient of their stone burden.   For observation I described the risks which include but are not limited to silent renal damage, life-threatening infection, need for emergent surgery, failure to pass stone and pain.   For ureteroscopy I described the risks which include bleeding, infection, damage to contiguous structures, positioning injury, ureteral stricture, ureteral avulsion, ureteral injury, need for prolonged ureteral stent, inability to perform ureteroscopy, need for an interval procedure, inability to clear stone burden, stent discomfort/pain, heart attack, stroke, pulmonary embolus and the inherent risks with general anesthesia.   For shockwave lithotripsy I described the risks which include arrhythmia, kidney contusion, kidney hemorrhage, need for transfusion, pain, inability to adequately break up  stone, inability to pass stone fragments, Steinstrasse, infection associated with obstructing stones, need for alternate surgical procedure, need for repeat shockwave lithotripsy, MI, CVA, PE and the inherent risks with anesthesia/conscious sedation.   For PCNL I described the risks including positioning injury, pneumothorax, hydrothorax, need for chest tube, inability to clear stone burden, renal laceration, arterial venous fistula or malformation, need for embolization of kidney, loss of kidney or renal function, need for repeat procedure, need for prolonged nephrostomy tube, ureteral avulsion, MI, CVA, PE and the inherent risks of general anesthesia.   - The patient would like to proceed with ESWL versus Ureteroscopy. She will call with her decision  2. Recurrent UTI -self start macrobid 100mg     No follow-ups on file.  [name], MD  [facility] [place]

## 2020-12-09 NOTE — Patient Instructions (Signed)
Dietary Guidelines to Help Prevent Kidney Stones Kidney stones are deposits of minerals and salts that form inside your kidneys. Your risk of developing kidney stones may be greater depending on your diet, your lifestyle, the medicines you take, and whether you have certain medical conditions. Most people can lower their chances of developing kidney stones by following the instructions below. Your dietitian may give you more specific instructions depending on your overall health and the type of kidney stones you tend to develop. What are tips for following this plan? Reading food labels  Choose foods with "no salt added" or "low-salt" labels. Limit your salt (sodium) intake to less than 1,500 mg a day. Choose foods with calcium for each meal and snack. Try to eat about 300 mg of calcium at each meal. Foods that contain 200-500 mg of calcium a serving include: 8 oz (237 mL) of milk, calcium-fortifiednon-dairy milk, and calcium-fortifiedfruit juice. Calcium-fortified means that calcium has been added to these drinks. 8 oz (237 mL) of kefir, yogurt, and soy yogurt. 4 oz (114 g) of tofu. 1 oz (28 g) of cheese. 1 cup (150 g) of dried figs. 1 cup (91 g) of cooked broccoli. One 3 oz (85 g) can of sardines or mackerel. Most people need 1,000-1,500 mg of calcium a day. Talk to your dietitian about how much calcium is recommended for you. Shopping Buy plenty of fresh fruits and vegetables. Most people do not need to avoid fruits and vegetables, even if these foods contain nutrients that may contribute to kidney stones. When shopping for convenience foods, choose: Whole pieces of fruit. Pre-made salads with dressing on the side. Low-fat fruit and yogurt smoothies. Avoid buying frozen meals or prepared deli foods. These can be high in sodium. Look for foods with live cultures, such as yogurt and kefir. Choose high-fiber grains, such as whole-wheat breads, oat bran, and wheat cereals. Cooking Do not add  salt to food when cooking. Place a salt shaker on the table and allow each person to add his or her own salt to taste. Use vegetable protein, such as beans, textured vegetable protein (TVP), or tofu, instead of meat in pasta, casseroles, and soups. Meal planning Eat less salt, if told by your dietitian. To do this: Avoid eating processed or pre-made food. Avoid eating fast food. Eat less animal protein, including cheese, meat, poultry, or fish, if told by your dietitian. To do this: Limit the number of times you have meat, poultry, fish, or cheese each week. Eat a diet free of meat at least 2 days a week. Eat only one serving each day of meat, poultry, fish, or seafood. When you prepare animal protein, cut pieces into small portion sizes. For most meat and fish, one serving is about the size of the palm of your hand. Eat at least five servings of fresh fruits and vegetables each day. To do this: Keep fruits and vegetables on hand for snacks. Eat one piece of fruit or a handful of berries with breakfast. Have a salad and fruit at lunch. Have two kinds of vegetables at dinner. Limit foods that are high in a substance called oxalate. These include: Spinach (cooked), rhubarb, beets, sweet potatoes, and Swiss chard. Peanuts. Potato chips, french fries, and baked potatoes with skin on. Nuts and nut products. Chocolate. If you regularly take a diuretic medicine, make sure to eat at least 1 or 2 servings of fruits or vegetables that are high in potassium each day. These include: Avocado. Banana. Orange, prune,   carrot, or tomato juice. Baked potato. Cabbage. Beans and split peas. Lifestyle  Drink enough fluid to keep your urine pale yellow. This is the most important thing you can do. Spread your fluid intake throughout the day. If you drink alcohol: Limit how much you use to: 0-1 drink a day for women who are not pregnant. 0-2 drinks a day for men. Be aware of how much alcohol is in your  drink. In the [place] one drink equals one 12 oz bottle of beer (355 mL), one 5 oz glass of wine (148 mL), or one 1 oz glass of hard liquor (44 mL). Lose weight if told by your health care provider. Work with your dietitian to find an eating plan and weight loss strategies that work best for you. General information Talk to your health care provider and dietitian about taking daily supplements. You may be told the following depending on your health and the cause of your kidney stones: Not to take supplements with vitamin C. To take a calcium supplement. To take a daily probiotic supplement. To take other supplements such as magnesium, fish oil, or vitamin B6. Take over-the-counter and prescription medicines only as told by your health care provider. These include supplements. What foods should I limit? Limit your intake of the following foods, or eat them as told by your dietitian. Vegetables Spinach. Rhubarb. Beets. Canned vegetables. Pickles. Olives. Baked potatoes with skin. Grains Wheat bran. Baked goods. Salted crackers. Cereals high in sugar. Meats and other proteins Nuts. Nut butters. Large portions of meat, poultry, or fish. Salted, precooked, or cured meats, such as sausages, meat loaves, and hot dogs. Dairy Cheese. Beverages Regular soft drinks. Regular vegetable juice. Seasonings and condiments Seasoning blends with salt. Salad dressings. Soy sauce. Ketchup. Barbecue sauce. Other foods Canned soups. Canned pasta sauce. Casseroles. Pizza. Lasagna. Frozen meals. Potato chips. French fries. The items listed above may not be a complete list of foods and beverages you should limit. Contact a dietitian for more information. What foods should I avoid? Talk to your dietitian about specific foods you should avoid based on the type of kidney stones you have and your overall health. Fruits Grapefruit. The item listed above may not be a complete list of foods and beverages you should  avoid. Contact a dietitian for more information. Summary Kidney stones are deposits of minerals and salts that form inside your kidneys. You can lower your risk of kidney stones by making changes to your diet. The most important thing you can do is drink enough fluid. Drink enough fluid to keep your urine pale yellow. Talk to your dietitian about how much calcium you should have each day, and eat less salt and animal protein as told by your dietitian. This information is not intended to replace advice given to you by your health care provider. Make sure you discuss any questions you have with your health care provider. Document Revised: 02/15/2019 Document Reviewed: 02/15/2019 Elsevier Patient Education  2022 Elsevier Inc.  

## 2021-01-08 ENCOUNTER — Telehealth: Payer: Self-pay

## 2021-01-08 ENCOUNTER — Other Ambulatory Visit: Payer: Self-pay

## 2021-01-08 ENCOUNTER — Other Ambulatory Visit: Payer: Medicare Other

## 2021-01-08 DIAGNOSIS — N390 Urinary tract infection, site not specified: Secondary | ICD-10-CM

## 2021-01-08 LAB — MICROSCOPIC EXAMINATION
Renal Epithel, UA: NONE SEEN /hpf
WBC, UA: >30 /hpf — AB (ref 0–5)

## 2021-01-08 LAB — URINALYSIS, ROUTINE W REFLEX MICROSCOPIC
Bilirubin, UA: NEGATIVE
Ketones, UA: NEGATIVE
Nitrite, UA: NEGATIVE
Specific Gravity, UA: 1.02 (ref 1.005–1.030)
Urobilinogen, Ur: 0.2 mg/dL (ref 0.2–1.0)
pH, UA: 6 (ref 5.0–7.5)

## 2021-01-08 NOTE — Telephone Encounter (Signed)
Patient called and wanted a refill on medication for her urinary tract infection. She advised she was still having a burning sensation when urinating.   Pharmacy [facility] [address], [place],  [address]

## 2021-01-08 NOTE — Telephone Encounter (Signed)
Patient placed on lab schedule for urine drop off.

## 2021-01-11 LAB — URINE CULTURE: Organism ID, Bacteria: NO GROWTH

## 2021-01-12 NOTE — Telephone Encounter (Signed)
Patient called back after dropping off urine late last week & not hearing back from the office or pharmacy about a refill on her medication for UTI.  The pt is still suffering & asks for the rx to be sent today, or notify her of next steps for treatment.  She is asking for the pharmacy to be changed to [facility] [address] of [address], please.  Thank you

## 2021-01-13 NOTE — Telephone Encounter (Signed)
Culture was negative. Patient made aware.

## 2021-01-20 ENCOUNTER — Telehealth: Payer: Self-pay

## 2021-01-20 MED ORDER — ESTRADIOL 0.1 MG/GM VA CREA: TOPICAL_CREAM | VAGINAL | 12 refills | Status: DC

## 2021-01-20 NOTE — Telephone Encounter (Signed)
Patient returned call to office. Notified of urine culture result. Patient reports she has had a chronic headache. Advised patient to reach out to her PCP regarding her headaches. Voiced understanding.

## 2021-01-20 NOTE — Telephone Encounter (Signed)
Called patient at [phone] Primary number not working   Left message for patient to return call to office.

## 2021-01-20 NOTE — Telephone Encounter (Signed)
-----   Message from Cleon Gustin, MD sent at 01/19/2021  8:43 AM EST ----- negative ----- Message ----- From: Dorisann Frames, RN Sent: 01/12/2021   8:43 AM EST To: Cleon Gustin, MD  Please review

## 2021-01-20 NOTE — Telephone Encounter (Signed)
Patient called requesting  to try estrace cream to help with burning and itching.  Per Dr. Alyson Ingles ok to send rx. Rx sent and patient made aware.

## 2021-02-03 ENCOUNTER — Encounter: Payer: Self-pay | Admitting: Family Medicine

## 2021-02-03 ENCOUNTER — Ambulatory Visit (INDEPENDENT_AMBULATORY_CARE_PROVIDER_SITE_OTHER): Payer: Medicare Other | Admitting: Family Medicine

## 2021-02-03 ENCOUNTER — Other Ambulatory Visit: Payer: Self-pay

## 2021-02-03 VITALS — BP 150/83 | HR 102 | Ht 64.0 in | Wt 126.1 lb

## 2021-02-03 DIAGNOSIS — N200 Calculus of kidney: Secondary | ICD-10-CM

## 2021-02-03 DIAGNOSIS — G4489 Other headache syndrome: Secondary | ICD-10-CM | POA: Diagnosis not present

## 2021-02-03 DIAGNOSIS — E782 Mixed hyperlipidemia: Secondary | ICD-10-CM

## 2021-02-03 DIAGNOSIS — E1169 Type 2 diabetes mellitus with other specified complication: Secondary | ICD-10-CM

## 2021-02-03 DIAGNOSIS — R2681 Unsteadiness on feet: Secondary | ICD-10-CM

## 2021-02-03 DIAGNOSIS — I10 Essential (primary) hypertension: Secondary | ICD-10-CM

## 2021-02-03 DIAGNOSIS — Z794 Long term (current) use of insulin: Secondary | ICD-10-CM

## 2021-02-03 DIAGNOSIS — R519 Headache, unspecified: Secondary | ICD-10-CM | POA: Insufficient documentation

## 2021-02-03 DIAGNOSIS — N3090 Cystitis, unspecified without hematuria: Secondary | ICD-10-CM

## 2021-02-03 NOTE — Assessment & Plan Note (Signed)
3 month /o abn gait espescially on first awakening refer Neurology for E/M

## 2021-02-03 NOTE — Assessment & Plan Note (Signed)
Bilateral nephrolithiasis, has upcoming procedure

## 2021-02-03 NOTE — Assessment & Plan Note (Signed)
[name] is reminded of the importance of commitment to daily physical activity for 30 minutes or more, as able and the need to limit carbohydrate intake to 30 to 60 grams per meal to help with blood sugar control.   The need to take medication as prescribed, test blood sugar as directed, and to call between visits if there is a concern that blood sugar is uncontrolled is also discussed.   [name] is reminded of the importance of daily foot exam, annual eye examination, and good blood sugar, blood pressure and cholesterol control.  Diabetic Labs Latest Ref Rng & Units 10/28/2020 05/15/2020 02/04/2020 12/04/2019 10/18/2019  HbA1c 4.8 - 5.6 % 7.5(H) 7.1(H) 7.7(H) - 7.9(A)  Microalbumin Not Estab. ug/mL - - - 304.0(H) -  Micro/Creat Ratio <30 mcg/mg creat - - - - -  Chol 100 - 199 mg/dL - - 203(H) - -  HDL >39 mg/dL - - 81 - -  Calc LDL 0 - 99 mg/dL - - 108(H) - -  Triglycerides 0 - 149 mg/dL - - 78 - -  Creatinine 0.57 - 1.00 mg/dL 0.69 0.85 0.82 - -   BP/Weight 02/03/2021 11/13/2020 11/04/2020 06/26/2020 06/17/2020 06/05/2020 04/05/2079  Systolic BP 388 719 597 471 855 - 015  Diastolic BP 83 82 78 64 70 - 77  Wt. (Lbs) 126.12 128 127.4 - 129.4 130 -  BMI 21.65 21.97 21.87 - 22.21 22.31 -   Foot/eye exam completion dates Latest Ref Rng & Units 06/17/2020 10/16/2019  Eye Exam No Retinopathy - No Retinopathy  Foot exam Order - - -  Foot Form Completion - Done -      Updated lab today, needs totest blood sugar daily once/ twice  At least

## 2021-02-03 NOTE — Assessment & Plan Note (Signed)
Recurrent symptoms , no infection, recommended that she use estrogen cream as prescribed and continue to follow closely with Urology

## 2021-02-03 NOTE — Patient Instructions (Addendum)
F/U in mid March, call if you need me before  Please schedule in house diabetic eye screen this year at checkout  Labs today, lipid, cmp and EGFr, hBa1C  Blood pressure is high today, please put medication in a pill box  You are referred to Neurology, Dr Merlene Laughter to evaluate frequent headaches and unsteady gait which has been present for the past approx 4  months  Keep working on bladder symptoms with Urology, yes use the cream  Take cozaar in the morning or early afternoon, and drink most of your water by 7 pm  Please resume testing blood sugar regularly  Thanks for choosing [facility], we consider it a privelige to serve you.

## 2021-02-03 NOTE — Progress Notes (Signed)
[name]     MRN: [id number]      DOB: 12-Mar-1950   HPI Ms. Dohner is here for follow up and re-evaluation of chronic medical conditions, medication management and review of any available recent lab and radiology data.  Preventive health is updated, specifically  Cancer screening and Immunization.   Questions or concerns regarding consultations or procedures which the PT has had in the interim are  addressed.Urology prescribed  estradiol for burning and she wants to know if safe, states she has recurrent cystitis symptoms tho no infection Has upcoming procedure for bilateral renal stones.  The PT denies any adverse reactions to current medications since the last visit.  C/o headaches approx 4 times / week, and unsteady  gait x 3 months Denies polyuria, polydipsia, blurred vision , or hypoglycemic episodes. Not testing regularly ROS Denies recent fever or chills. Denies sinus pressure, nasal congestion, ear pain or sore throat. Denies chest congestion, productive cough or wheezing. Denies chest pains, palpitations and leg swelling Denies abdominal pain, nausea, vomiting,diarrhea or constipation.   . Denies joint pain, swelling and limitation in mobility.  Denies depression, anxiety or insomnia. Denies skin break down or rash.   PE  BP (!) 150/83   Pulse (!) 102   Ht 5\' 4"  (1.626 m)   Wt 126 lb 1.9 oz (57.2 kg)   SpO2 96%   BMI 21.65 kg/m   Patient alert and oriented and in no cardiopulmonary distress.  HEENT: No facial asymmetry, EOMI,     Neck supple .  Chest: Clear to auscultation bilaterally.  CVS: S1, S2 no murmurs, no S3.Regular rate.  ABD: Soft non tender.   Ext: No edema  MS: Adequate ROM spine, shoulders, hips and knees.  Skin: Intact, no ulcerations or rash noted.  Psych: Good eye contact, normal affect. Memory intact not anxious or depressed appearing.  CNS: CN 2-12 intact, power,  normal throughout.no focal deficits noted.   Assessment &  Plan  Type 2 diabetes mellitus with other specified complication [facility]) Ms. Schiro is reminded of the importance of commitment to daily physical activity for 30 minutes or more, as able and the need to limit carbohydrate intake to 30 to 60 grams per meal to help with blood sugar control.   The need to take medication as prescribed, test blood sugar as directed, and to call between visits if there is a concern that blood sugar is uncontrolled is also discussed.   Ms. Keisler is reminded of the importance of daily foot exam, annual eye examination, and good blood sugar, blood pressure and cholesterol control.  Diabetic Labs Latest Ref Rng & Units 10/28/2020 05/15/2020 02/04/2020 12/04/2019 10/18/2019  HbA1c 4.8 - 5.6 % 7.5(H) 7.1(H) 7.7(H) - 7.9(A)  Microalbumin Not Estab. ug/mL - - - 304.0(H) -  Micro/Creat Ratio <30 mcg/mg creat - - - - -  Chol 100 - 199 mg/dL - - 203(H) - -  HDL >39 mg/dL - - 81 - -  Calc LDL 0 - 99 mg/dL - - 108(H) - -  Triglycerides 0 - 149 mg/dL - - 78 - -  Creatinine 0.57 - 1.00 mg/dL 0.69 0.85 0.82 - -   BP/Weight 02/03/2021 11/13/2020 11/04/2020 06/26/2020 06/17/2020 06/05/2020 06/22/3843  Systolic BP 364 680 321 224 825 - 003  Diastolic BP 83 82 78 64 70 - 77  Wt. (Lbs) 126.12 128 127.4 - 129.4 130 -  BMI 21.65 21.97 21.87 - 22.21 22.31 -   Foot/eye exam completion dates  Latest Ref Rng & Units 06/17/2020 10/16/2019  Eye Exam No Retinopathy - No Retinopathy  Foot exam Order - - -  Foot Form Completion - Done -      Updated lab today, needs totest blood sugar daily once/ twice  At least  Unsteady gait 3 month /o abn gait espescially on first awakening refer Neurology for E/M  Essential hypertension Uncontrolled, pt reports uncertain whether she has taken med, advised her to start pill packing  Headache disorder Headaches on avg  Days / week x 3 month, Neurology to E/M  Cystitis Recurrent symptoms , no infection, recommended that she use estrogen cream as  prescribed and continue to follow closely with Urology  Nephrolithiasis Bilateral nephrolithiasis, has upcoming procedure

## 2021-02-03 NOTE — Assessment & Plan Note (Signed)
Uncontrolled, pt reports uncertain whether she has taken med, advised her to start pill packing

## 2021-02-03 NOTE — Assessment & Plan Note (Signed)
Headaches on avg  Days / week x 3 month, Neurology to E/M

## 2021-02-04 ENCOUNTER — Other Ambulatory Visit: Payer: Self-pay | Admitting: *Deleted

## 2021-02-04 ENCOUNTER — Ambulatory Visit (INDEPENDENT_AMBULATORY_CARE_PROVIDER_SITE_OTHER): Payer: Medicare Other

## 2021-02-04 DIAGNOSIS — Z0000 Encounter for general adult medical examination without abnormal findings: Secondary | ICD-10-CM | POA: Diagnosis not present

## 2021-02-04 DIAGNOSIS — E1169 Type 2 diabetes mellitus with other specified complication: Secondary | ICD-10-CM

## 2021-02-04 DIAGNOSIS — Z794 Long term (current) use of insulin: Secondary | ICD-10-CM

## 2021-02-04 DIAGNOSIS — I10 Essential (primary) hypertension: Secondary | ICD-10-CM

## 2021-02-04 DIAGNOSIS — E782 Mixed hyperlipidemia: Secondary | ICD-10-CM

## 2021-02-04 LAB — HEMOGLOBIN A1C
Est. average glucose Bld gHb Est-mCnc: 166 mg/dL
Hgb A1c MFr Bld: 7.4 % — ABNORMAL HIGH (ref 4.8–5.6)

## 2021-02-04 LAB — CMP14+EGFR
ALT: 11 IU/L (ref 0–32)
AST: 12 IU/L (ref 0–40)
Albumin/Globulin Ratio: 1.7 (ref 1.2–2.2)
Albumin: 4.5 g/dL (ref 3.7–4.7)
Alkaline Phosphatase: 92 IU/L (ref 44–121)
BUN/Creatinine Ratio: 25 (ref 12–28)
BUN: 17 mg/dL (ref 8–27)
Bilirubin Total: 0.5 mg/dL (ref 0.0–1.2)
CO2: 26 mmol/L (ref 20–29)
Calcium: 9.5 mg/dL (ref 8.7–10.3)
Chloride: 101 mmol/L (ref 96–106)
Creatinine, Ser: 0.69 mg/dL (ref 0.57–1.00)
Globulin, Total: 2.7 g/dL (ref 1.5–4.5)
Glucose: 159 mg/dL — ABNORMAL HIGH (ref 70–99)
Potassium: 3.4 mmol/L — ABNORMAL LOW (ref 3.5–5.2)
Sodium: 144 mmol/L (ref 134–144)
Total Protein: 7.2 g/dL (ref 6.0–8.5)
eGFR: 93 mL/min/{1.73_m2} (ref >59)

## 2021-02-04 LAB — LIPID PANEL
Chol/HDL Ratio: 2.5 ratio (ref 0.0–4.4)
Cholesterol, Total: 198 mg/dL (ref 100–199)
HDL: 78 mg/dL (ref >39)
LDL Chol Calc (NIH): 108 mg/dL — ABNORMAL HIGH (ref 0–99)
Triglycerides: 65 mg/dL (ref 0–149)
VLDL Cholesterol Cal: 12 mg/dL (ref 5–40)

## 2021-02-04 NOTE — Progress Notes (Signed)
Subjective:   [name] is a 70 y.o. female who presents for Medicare Annual (Subsequent) preventive examination.  I connected with  [name] on 02/04/21 by a audio enabled telemedicine application and verified that I am speaking with the correct person using two identifiers.  Patient Location: Home  Provider Location: Office/Clinic  I discussed the limitations of evaluation and management by telemedicine. The patient expressed understanding and agreed to proceed.   Review of Systems     [name] , Thank you for taking time to come for your Medicare Wellness Visit. I appreciate your ongoing commitment to your health goals. Please review the following plan we discussed and let me know if I can assist you in the future.   These are the goals we discussed:  Goals      DIET - INCREASE WATER INTAKE     Eat more fruits and vegetables     Exercise 3x per week (30 min per time)     Glycemic Management Optimized     Evidence-based guidance:  Anticipate A1C testing (point-of-care) every 3 to 6 months based on goal attainment.  Review mutually-set A1C goal or target range.  Anticipate screening for thyroid dysfunction, adrenal dysfunction and celiac disease based on presenting signs/symptoms.  Anticipate use of insulin, multidose or continuous subcutaneous infusion with periodic adjustments; consider active involvement of pharmacist.  Explore child and caregiver fear of hypoglycemic episodes that may impact adherence to treatment plan, such as withholding insulin and allowing blood sugars to be ?oa little? high.  Provide medical nutrition therapy and development of individualized eating plan.  Compare child or caregiver reported symptoms of hypo or hyperglycemia to blood glucose levels, diet and fluid intake, current medications, psychosocial and physiologic stressors, change in activity and barriers to care adherence.  Promote self-monitoring of blood glucose levels,  interval or continuous.  Assess and address barriers regarding adherence to treatment and self-management plan, including patient factors, such as family cohesion, age, developmental ability, depression, anxiety, fear of hypoglycemia or weight gain, as   well as medication cost, side effects and complicated regimen.  Encourage developmentally-appropriate balance between dependence and independence in care, especially when intensifying treatment, such as continuous blood glucose monitoring or the use of an insulin pump.  Consider referral to community-based diabetes education program, visiting nurse, community health worker or health coach.  Initiate or review diabetes medical management plan for school that may include storage of insulin and clean, private location for insulin administration or blood glucose monitoring.   Notes: pt wants to regulate sugars.      Increase physical activity     Walking for 30 mins for 3 days a week      Patient Stated     More active         This is a list of the screening recommended for you and due dates:  Health Maintenance  Topic Date Due   Zoster (Shingles) Vaccine (1 of 2) Never done   Eye exam for diabetics  10/15/2020   Complete foot exam   06/17/2021   Hemoglobin A1C  08/03/2021   Colon Cancer Screening  10/25/2021   Tetanus Vaccine  04/24/2022   Mammogram  07/03/2022   Pneumonia Vaccine  Completed   Flu Shot  Completed   DEXA scan (bone density measurement)  Completed   COVID-19 Vaccine  Completed   Hepatitis C Screening: USPSTF Recommendation to screen - Ages 50-79 yo.  Completed   HPV Vaccine  Aged Out  Cardiac Risk Factors include: none     Objective:    Today's Vitals   02/04/21 1434 02/04/21 1435  PainSc: 0-No pain 0-No pain   There is no height or weight on file to calculate BMI.  Advanced Directives 02/04/2021 06/24/2020 05/19/2020 05/15/2020 12/31/2019 12/28/2019 10/26/2018  Does Patient Have a Medical Advance Directive? No  No No No No No No  Would patient like information on creating a medical advance directive? No - Patient declined No - Patient declined - No - Patient declined No - Patient declined - No - Patient declined    Current Medications (verified) Outpatient Encounter Medications as of 02/04/2021  Medication Sig   acetaminophen (TYLENOL) 500 MG tablet Take 1,000 mg by mouth every 6 (six) hours as needed for headache or moderate pain.   aspirin EC 81 MG tablet Take 1 tablet (81 mg total) by mouth daily.   atorvastatin (LIPITOR) 10 MG tablet Take one tablet by mouth every Monday, Wednesday and Friday, and take half tablet , every Tuesday, Thursday, Saturday and Sunday (Patient taking differently: Take 5-10 mg by mouth See admin instructions. Take 10 mg by mouth every Monday, Wednesday and Friday, and take 5 mg every Tuesday, Thursday, Saturday and Sunday)   B-D ULTRAFINE III SHORT PEN 31G X 8 MM MISC FOR USE WITH INSULIN ONCE DAILY DX E11.9   Cholecalciferol (VITAMIN D3) 50 MCG (2000 UT) TABS Take 2,000 Units by mouth daily.   clotrimazole-betamethasone (LOTRISONE) cream Apply 1 application topically 2 (two) times daily.   estradiol (ESTRACE) 0.1 MG/GM vaginal cream 1 Gm nightly vaginally for 2 weeks and  then 2-3 x's weekly   ferrous sulfate 325 (65 FE) MG tablet Take 325 mg by mouth 2 (two) times daily with a meal.   hydroquinone 4 % cream Apply topically 2 (two) times daily.   LANTUS SOLOSTAR 100 UNIT/ML Solostar Pen INJECT 22 UNITS INTO THE SKIN DAILY AT 10 PM.   losartan (COZAAR) 25 MG tablet Take half tablet once daily by mouth   metFORMIN (GLUCOPHAGE) 1000 MG tablet Take 1 tablet (1,000 mg total) by mouth 2 (two) times daily with a meal.   OneTouch Delica Lancets 94R MISC Once daily dx e11.9 (give lancects that go with pts meter)   ONETOUCH ULTRA test strip USE TO TEST BLOOD SUGAR 3 TIMES A DAY AS DIRECTED DX E11.65   Polyethyl Glycol-Propyl Glycol (LUBRICANT EYE DROPS) 0.4-0.3 % SOLN Place 1 drop  into both eyes 3 (three) times daily as needed (dry/irritated eyes.).   No facility-administered encounter medications on file as of 02/04/2021.    Allergies (verified) Onglyza [saxagliptin hydrochloride], Pravastatin, and Ace inhibitors   History: Past Medical History:  Diagnosis Date   BACK PAIN, THORACIC REGION, RIGHT 01/27/2010   Qualifier: Diagnosis of  By: Moshe Cipro MD, Jerrel Ivory of foot 07/15/2017   Essential hypertension    Headache(784.0)    Hematuria 02/24/2016   History of kidney stones    Hyperlipidemia    Irregular heart beats    Microscopic hematuria 02/24/2016   Neck pain on right side 01/23/2013   Piles (hemorrhoids) 06/22/2011   Renal calculus, right 05/31/2016   Shoulder pain, right 01/25/2014   SKIN TAG 06/13/2008   Qualifier: Diagnosis of  By: Moshe Cipro MD, Margaret     TIA (transient ischemic attack) 06/19/2016   Type 2 diabetes mellitus ([place])    Vaginitis    Past Surgical History:  Procedure Laterality Date   COLONOSCOPY N/A 10/26/2018  Procedure: COLONOSCOPY;  Surgeon: Rogene Houston, MD;  Location: [facility];  Service: Endoscopy;  Laterality: N/A;  830-10:30am   cyst removed Right 1998   cyst- removed from right wrist   CYSTOSCOPY WITH RETROGRADE PYELOGRAM, URETEROSCOPY AND STENT PLACEMENT Right 05/19/2020   Procedure: CYSTOSCOPY WITH RETROGRADE PYELOGRAM, URETEROSCOPY AND STENT PLACEMENT;  Surgeon: Cleon Gustin, MD;  Location: [facility];  Service: Urology;  Laterality: Right;   CYSTOSCOPY WITH RETROGRADE PYELOGRAM, URETEROSCOPY AND STENT PLACEMENT Right 06/26/2020   Procedure: CYSTOSCOPY WITH RETROGRADE PYELOGRAM, URETEROSCOPY AND STENT EXCHANGE;  Surgeon: Cleon Gustin, MD;  Location: [facility];  Service: Urology;  Laterality: Right;   EXTRACORPOREAL SHOCK WAVE LITHOTRIPSY Right 05/29/2018   Procedure: EXTRACORPOREAL SHOCK WAVE LITHOTRIPSY (ESWL);  Surgeon: Kathie Rhodes, MD;  Location: [facility];  Service: Urology;  Laterality: Right;    HOLMIUM LASER APPLICATION Right 3/61/4431   Procedure: HOLMIUM LASER APPLICATION;  Surgeon: Cleon Gustin, MD;  Location: [facility];  Service: Urology;  Laterality: Right;   lithotrpsy     over 20 years   NEPHROLITHOTOMY Right 05/31/2016   Procedure: NEPHROLITHOTOMY PERCUTANEOUS WITH SURGEON ACCESS;  Surgeon: Cleon Gustin, MD;  Location: [facility];  Service: Urology;  Laterality: Right;   POLYPECTOMY  10/26/2018   Procedure: POLYPECTOMY;  Surgeon: Rogene Houston, MD;  [facility];  Service: Endoscopy;;  colon   TOTAL ABDOMINAL HYSTERECTOMY W/ BILATERAL SALPINGOOPHORECTOMY  1998   TUBAL LIGATION     Family History  Problem Relation Age of Onset   Alcohol abuse Mother    Esophageal cancer Father    Alcohol abuse Father    Diabetes Sister    Diabetes Sister    Hypertension Sister    Heart attack Sister        in 32's   Seizures Brother    Social History   Socioeconomic History   Marital status: Single    Spouse name: Not on file   Number of children: 1   Years of education: Not on file   Highest education level: 12th grade  Occupational History   Occupation: employed   Tobacco Use   Smoking status: Never   Smokeless tobacco: Never  Vaping Use   Vaping Use: Never used  Substance and Sexual Activity   Alcohol use: No   Drug use: No   Sexual activity: Not Currently  Other Topics Concern   Not on file  Social History Narrative   Not on file   Social Determinants of Health   Financial Resource Strain: Low Risk    Difficulty of Paying Living Expenses: Not hard at all  Food Insecurity: No Food Insecurity   Worried About Charity fundraiser in the Last Year: Never true   [place] in the Last Year: Never true  Transportation Needs: No Transportation Needs   Lack of Transportation (Medical): No   Lack of Transportation (Non-Medical): No  Physical Activity: Inactive   Days of Exercise per Week: 0 days   Minutes of Exercise per Session: 0 min   Stress: No Stress Concern Present   Feeling of Stress : Only a little  Social Connections: Moderately Isolated   Frequency of Communication with Friends and Family: Twice a week   Frequency of Social Gatherings with Friends and Family: Three times a week   Attends Religious Services: 1 to 4 times per year   Active Member of Clubs or Organizations: No   Attends Archivist Meetings: Never  Marital Status: Separated    Tobacco Counseling Counseling given: Not Answered   Clinical Intake:  Pre-visit preparation completed: Yes  Pain : No/denies pain Pain Score: 0-No pain     BMI - recorded: 21.64 Nutritional Status: BMI of 19-24  Normal Nutritional Risks: None Diabetes: Yes CBG done?: No Did pt. bring in CBG monitor from home?: No  How often do you need to have someone help you when you read instructions, pamphlets, or other written materials from your doctor or pharmacy?: 1 - Never What is the last grade level you completed in school?: 12  Diabetic? Nutrition Risk Assessment:  Has the patient had any N/V/D within the last 2 months?  No  Does the patient have any non-healing wounds?  No  Has the patient had any unintentional weight loss or weight gain?  No   Diabetes:  Is the patient diabetic?  Yes  If diabetic, was a CBG obtained today?  No  Did the patient bring in their glucometer from home?  No  How often do you monitor your CBG's? Twice Daily .   Financial Strains and Diabetes Management:  Are you having any financial strains with the device, your supplies or your medication? No .  Does the patient want to be seen by Chronic Care Management for management of their diabetes?  No  Would the patient like to be referred to a Nutritionist or for Diabetic Management?  No   Diabetic Exams:  Diabetic Eye Exam: Overdue for diabetic eye exam. Pt has been advised about the importance in completing this exam. Patient advised to call and schedule an eye  exam. Diabetic Foot Exam: Completed 06/17/20    Interpreter Needed?: No      Activities of Daily Living In your present state of health, do you have any difficulty performing the following activities: 02/04/2021 06/24/2020  Hearing? N N  Vision? Y N  Difficulty concentrating or making decisions? Y N  Walking or climbing stairs? N N  Dressing or bathing? N N  Doing errands, shopping? N N  Preparing Food and eating ? N -  Using the Toilet? N -  In the past six months, have you accidently leaked urine? N -  Do you have problems with loss of bowel control? N -  Managing your Medications? N -  Managing your Finances? N -  Housekeeping or managing your Housekeeping? N -  Some recent data might be hidden    Patient Care Team: Fayrene Helper, MD as PCP - General Williams Che, MD (Inactive) (Ophthalmology)  Indicate any recent Medical Services you may have received from other than [facility] providers in the past year (date may be approximate).     Assessment:   This is a routine wellness examination for [name].  Hearing/Vision screen No results found.  Dietary issues and exercise activities discussed: Current Exercise Habits: The patient does not participate in regular exercise at present, Exercise limited by: None identified   Goals Addressed             This Visit's Progress    Patient Stated       More active       Depression Screen PHQ 2/9 Scores 02/04/2021 02/04/2021 02/03/2021 11/13/2020 11/04/2020 10/28/2020 02/12/2020  PHQ - 2 Score 0 0 0 0 0 0 0  PHQ- 9 Score - - - - - - -    Fall Risk Fall Risk  02/04/2021 02/03/2021 11/13/2020 11/04/2020 10/28/2020  Falls in the past year?  0 0 0 0 0  Number falls in past yr: 0 0 0 0 0  Injury with Fall? 0 0 0 0 0  Risk for fall due to : No Fall Risks No Fall Risks No Fall Risks - No Fall Risks  Follow up Falls evaluation completed Falls evaluation completed Falls evaluation completed - Falls evaluation completed     [place]:  Any stairs in or around the home? Yes  If so, are there any without handrails? Yes  Home free of loose throw rugs in walkways, pet beds, electrical cords, etc? No  Adequate lighting in your home to reduce risk of falls? No   ASSISTIVE DEVICES UTILIZED TO PREVENT FALLS:  Life alert? No  Use of a cane, walker or w/c? No  Grab bars in the bathroom? No  Shower chair or bench in shower? No  Elevated toilet seat or a handicapped toilet? Yes   MMSE - Mini Mental State Exam 02/04/2021  Not completed: Unable to complete     6CIT Screen 02/04/2021 12/31/2019 12/28/2018 12/26/2017 12/27/2016  What Year? 0 points 0 points 0 points 0 points 0 points  What month? 0 points 0 points 0 points 0 points 0 points  What time? 0 points 0 points 0 points 0 points 0 points  Count back from 20 0 points 0 points 0 points 0 points 0 points  Months in reverse 0 points 0 points 0 points 0 points 0 points  Repeat phrase 0 points 0 points 0 points 4 points 0 points  Total Score 0 0 0 4 0    Immunizations Immunization History  Administered Date(s) Administered   Fluad Quad(high Dose 65+) 12/25/2020   Influenza Split 04/24/2012   Influenza,inj,Quad PF,6+ Mos 01/25/2014, 03/14/2015, 05/24/2016, 12/27/2016, 11/15/2017, 12/12/2018, 12/04/2019   PFIZER(Purple Top)SARS-COV-2 Vaccination 04/18/2019, 05/09/2019, 12/20/2019   Pfizer Covid-19 Vaccine Bivalent Booster 35yrs & up 12/08/2020   Pneumococcal Conjugate-13 05/24/2016   Pneumococcal Polysaccharide-23 12/26/2017   Tdap 04/24/2012   Zoster, Live 09/13/2013    TDAP status: Up to date  Flu Vaccine status: Up to date  Pneumococcal vaccine status: Up to date  Covid-19 vaccine status: Completed vaccines  Qualifies for Shingles Vaccine? Yes   Zostavax completed No   Shingrix Completed?: No.    Education has been provided regarding the importance of this vaccine. Patient has been advised to call  insurance company to determine out of pocket expense if they have not yet received this vaccine. Advised may also receive vaccine at local pharmacy or Health Dept. Verbalized acceptance and understanding.  Screening Tests Health Maintenance  Topic Date Due   Zoster Vaccines- Shingrix (1 of 2) Never done   OPHTHALMOLOGY EXAM  10/15/2020   FOOT EXAM  06/17/2021   HEMOGLOBIN A1C  08/03/2021   COLONOSCOPY (Pts 45-55yrs Insurance coverage will need to be confirmed)  10/25/2021   TETANUS/TDAP  04/24/2022   MAMMOGRAM  07/03/2022   Pneumonia Vaccine 56+ Years old  Completed   INFLUENZA VACCINE  Completed   DEXA SCAN  Completed   COVID-19 Vaccine  Completed   Hepatitis C Screening  Completed   HPV VACCINES  Aged Out    Health Maintenance  Health Maintenance Due  Topic Date Due   Zoster Vaccines- Shingrix (1 of 2) Never done   OPHTHALMOLOGY EXAM  10/15/2020    Colorectal cancer screening: Type of screening: Colonoscopy. Completed 10/26/2018. Repeat every 3 years  Mammogram status: Completed 07/02/20. Repeat every year  Bone Density status: Completed 02/06/2013. Results reflect: Bone density results: NORMAL. Repeat every 2 years.  Lung Cancer Screening: (Low Dose CT Chest recommended if Age 74-80 years, 30 pack-year currently smoking OR have quit w/in 15years.) does not qualify.   Lung Cancer Screening Referral: n/a  Additional Screening:  Hepatitis C Screening: does qualify; Completed 01/20/2016  Vision Screening: Recommended annual ophthalmology exams for early detection of glaucoma and other disorders of the eye. Is the patient up to date with their annual eye exam?  No  Who is the provider or what is the name of the office in which the patient attends annual eye exams? Dr Jorja Loa If pt is not established with a provider, would they like to be referred to a provider to establish care? No .   Dental Screening: Recommended annual dental exams for proper oral hygiene  Community  Resource Referral / Chronic Care Management: CRR required this visit?  No   CCM required this visit?  No      Plan:     I have personally reviewed and noted the following in the patient's chart:   Medical and social history Use of alcohol, tobacco or illicit drugs  Current medications and supplements including opioid prescriptions.  Functional ability and status Nutritional status Physical activity Advanced directives List of other physicians Hospitalizations, surgeries, and ER visits in previous 12 months Vitals Screenings to include cognitive, depression, and falls Referrals and appointments  In addition, I have reviewed and discussed with patient certain preventive protocols, quality metrics, and best practice recommendations. A written personalized care plan for preventive services as well as general preventive health recommendations were provided to patient.  [name] , Thank you for taking time to come for your Medicare Wellness Visit. I appreciate your ongoing commitment to your health goals. Please review the following plan we discussed and let me know if I can assist you in the future.   These are the goals we discussed:  Goals      DIET - INCREASE WATER INTAKE     Eat more fruits and vegetables     Exercise 3x per week (30 min per time)     Glycemic Management Optimized     Evidence-based guidance:  Anticipate A1C testing (point-of-care) every 3 to 6 months based on goal attainment.  Review mutually-set A1C goal or target range.  Anticipate screening for thyroid dysfunction, adrenal dysfunction and celiac disease based on presenting signs/symptoms.  Anticipate use of insulin, multidose or continuous subcutaneous infusion with periodic adjustments; consider active involvement of pharmacist.  Explore child and caregiver fear of hypoglycemic episodes that may impact adherence to treatment plan, such as withholding insulin and allowing blood sugars to be ?oa  little? high.  Provide medical nutrition therapy and development of individualized eating plan.  Compare child or caregiver reported symptoms of hypo or hyperglycemia to blood glucose levels, diet and fluid intake, current medications, psychosocial and physiologic stressors, change in activity and barriers to care adherence.  Promote self-monitoring of blood glucose levels, interval or continuous.  Assess and address barriers regarding adherence to treatment and self-management plan, including patient factors, such as family cohesion, age, developmental ability, depression, anxiety, fear of hypoglycemia or weight gain, as   well as medication cost, side effects and complicated regimen.  Encourage developmentally-appropriate balance between dependence and independence in care, especially when intensifying treatment, such as continuous blood glucose monitoring or the use of an insulin pump.  Consider referral to community-based diabetes education program, visiting  nurse, community Economist or Engineer, maintenance.  Initiate or review diabetes medical management plan for school that may include storage of insulin and clean, private location for insulin administration or blood glucose monitoring.   Notes: pt wants to regulate sugars.      Increase physical activity     Walking for 30 mins for 3 days a week      Patient Stated     More active         This is a list of the screening recommended for you and due dates:  Health Maintenance  Topic Date Due   Zoster (Shingles) Vaccine (1 of 2) Never done   Eye exam for diabetics  10/15/2020   Complete foot exam   06/17/2021   Hemoglobin A1C  08/03/2021   Colon Cancer Screening  10/25/2021   Tetanus Vaccine  04/24/2022   Mammogram  07/03/2022   Pneumonia Vaccine  Completed   Flu Shot  Completed   DEXA scan (bone density measurement)  Completed   COVID-19 Vaccine  Completed   Hepatitis C Screening: USPSTF Recommendation to screen - Ages 5-79 yo.   Completed   HPV Vaccine  Aged [address] [place]   02/04/2021   Nurse Notes:

## 2021-02-04 NOTE — Patient Instructions (Signed)
[name] , Thank you for taking time to come for your Medicare Wellness Visit. I appreciate your ongoing commitment to your health goals. Please review the following plan we discussed and let me know if I can assist you in the future.   These are the goals we discussed:  Goals      DIET - INCREASE WATER INTAKE     Eat more fruits and vegetables     Exercise 3x per week (30 min per time)     Glycemic Management Optimized     Evidence-based guidance:  Anticipate A1C testing (point-of-care) every 3 to 6 months based on goal attainment.  Review mutually-set A1C goal or target range.  Anticipate screening for thyroid dysfunction, adrenal dysfunction and celiac disease based on presenting signs/symptoms.  Anticipate use of insulin, multidose or continuous subcutaneous infusion with periodic adjustments; consider active involvement of pharmacist.  Explore child and caregiver fear of hypoglycemic episodes that may impact adherence to treatment plan, such as withholding insulin and allowing blood sugars to be ?oa little? high.  Provide medical nutrition therapy and development of individualized eating plan.  Compare child or caregiver reported symptoms of hypo or hyperglycemia to blood glucose levels, diet and fluid intake, current medications, psychosocial and physiologic stressors, change in activity and barriers to care adherence.  Promote self-monitoring of blood glucose levels, interval or continuous.  Assess and address barriers regarding adherence to treatment and self-management plan, including patient factors, such as family cohesion, age, developmental ability, depression, anxiety, fear of hypoglycemia or weight gain, as   well as medication cost, side effects and complicated regimen.  Encourage developmentally-appropriate balance between dependence and independence in care, especially when intensifying treatment, such as continuous blood glucose monitoring or the use of an insulin pump.   Consider referral to community-based diabetes education program, visiting nurse, community health worker or health coach.  Initiate or review diabetes medical management plan for school that may include storage of insulin and clean, private location for insulin administration or blood glucose monitoring.   Notes: pt wants to regulate sugars.      Increase physical activity     Walking for 30 mins for 3 days a week      Patient Stated     More active         This is a list of the screening recommended for you and due dates:  Health Maintenance  Topic Date Due   Zoster (Shingles) Vaccine (1 of 2) Never done   Eye exam for diabetics  10/15/2020   Complete foot exam   06/17/2021   Hemoglobin A1C  08/03/2021   Colon Cancer Screening  10/25/2021   Tetanus Vaccine  04/24/2022   Mammogram  07/03/2022   Pneumonia Vaccine  Completed   Flu Shot  Completed   DEXA scan (bone density measurement)  Completed   COVID-19 Vaccine  Completed   Hepatitis C Screening: USPSTF Recommendation to screen - Ages 2-79 yo.  Completed   HPV Vaccine  Aged Out    [name] , Thank you for taking time to come for your Medicare Wellness Visit. I appreciate your ongoing commitment to your health goals. Please review the following plan we discussed and let me know if I can assist you in the future.   Screening recommendations/referrals: Colonoscopy: Complete Mammogram: Complete Bone Density: Complete Recommended yearly ophthalmology/optometry visit for glaucoma screening and checkup Recommended yearly dental visit for hygiene and checkup  Vaccinations: Influenza vaccine: Complete Pneumococcal vaccine: Complete  Tdap vaccine: Complete Shingles vaccine: Due now     Advanced directives: No patient has information packet   Conditions/risks identified: Hypertension, Diabetes  Next appointment: 1 YEAR    Preventive Care 12 Years and Older, Female Preventive care refers to lifestyle choices and visits  with your health care provider that can promote health and wellness. What does preventive care include? A yearly physical exam. This is also called an annual well check. Dental exams once or twice a year. Routine eye exams. Ask your health care provider how often you should have your eyes checked. Personal lifestyle choices, including: Daily care of your teeth and gums. Regular physical activity. Eating a healthy diet. Avoiding tobacco and drug use. Limiting alcohol use. Practicing safe sex. Taking low-dose aspirin every day. Taking vitamin and mineral supplements as recommended by your health care provider. What happens during an annual well check? The services and screenings done by your health care provider during your annual well check will depend on your age, overall health, lifestyle risk factors, and family history of disease. Counseling  Your health care provider may ask you questions about your: Alcohol use. Tobacco use. Drug use. Emotional well-being. Home and relationship well-being. Sexual activity. Eating habits. History of falls. Memory and ability to understand (cognition). Work and work Statistician. Reproductive health. Screening  You may have the following tests or measurements: Height, weight, and BMI. Blood pressure. Lipid and cholesterol levels. These may be checked every 5 years, or more frequently if you are over 40 years old. Skin check. Lung cancer screening. You may have this screening every year starting at age 84 if you have a 30-pack-year history of smoking and currently smoke or have quit within the past 15 years. Fecal occult blood test (FOBT) of the stool. You may have this test every year starting at age 67. Flexible sigmoidoscopy or colonoscopy. You may have a sigmoidoscopy every 5 years or a colonoscopy every 10 years starting at age 38. Hepatitis C blood test. Hepatitis B blood test. Sexually transmitted disease (STD) testing. Diabetes  screening. This is done by checking your blood sugar (glucose) after you have not eaten for a while (fasting). You may have this done every 1-3 years. Bone density scan. This is done to screen for osteoporosis. You may have this done starting at age 62. Mammogram. This may be done every 1-2 years. Talk to your health care provider about how often you should have regular mammograms. Talk with your health care provider about your test results, treatment options, and if necessary, the need for more tests. Vaccines  Your health care provider may recommend certain vaccines, such as: Influenza vaccine. This is recommended every year. Tetanus, diphtheria, and acellular pertussis (Tdap, Td) vaccine. You may need a Td booster every 10 years. Zoster vaccine. You may need this after age 36. Pneumococcal 13-valent conjugate (PCV13) vaccine. One dose is recommended after age 73. Pneumococcal polysaccharide (PPSV23) vaccine. One dose is recommended after age 6. Talk to your health care provider about which screenings and vaccines you need and how often you need them. This information is not intended to replace advice given to you by your health care provider. Make sure you discuss any questions you have with your health care provider. Document Released: 03/21/2015 Document Revised: 11/12/2015 Document Reviewed: 12/24/2014 Elsevier Interactive Patient Education  2017 [employer] Prevention in the Home Falls can cause injuries. They can happen to people of all ages. There are many things you can do to  make your home safe and to help prevent falls. What can I do on the outside of my home? Regularly fix the edges of walkways and driveways and fix any cracks. Remove anything that might make you trip as you walk through a door, such as a raised step or threshold. Trim any bushes or trees on the path to your home. Use bright outdoor lighting. Clear any walking paths of anything that might make someone  trip, such as rocks or tools. Regularly check to see if handrails are loose or broken. Make sure that both sides of any steps have handrails. Any raised decks and porches should have guardrails on the edges. Have any leaves, snow, or ice cleared regularly. Use sand or salt on walking paths during winter. Clean up any spills in your garage right away. This includes oil or grease spills. What can I do in the bathroom? Use night lights. Install grab bars by the toilet and in the tub and shower. Do not use towel bars as grab bars. Use non-skid mats or decals in the tub or shower. If you need to sit down in the shower, use a plastic, non-slip stool. Keep the floor dry. Clean up any water that spills on the floor as soon as it happens. Remove soap buildup in the tub or shower regularly. Attach bath mats securely with double-sided non-slip rug tape. Do not have throw rugs and other things on the floor that can make you trip. What can I do in the bedroom? Use night lights. Make sure that you have a light by your bed that is easy to reach. Do not use any sheets or blankets that are too big for your bed. They should not hang down onto the floor. Have a firm chair that has side arms. You can use this for support while you get dressed. Do not have throw rugs and other things on the floor that can make you trip. What can I do in the kitchen? Clean up any spills right away. Avoid walking on wet floors. Keep items that you use a lot in easy-to-reach places. If you need to reach something above you, use a strong step stool that has a grab bar. Keep electrical cords out of the way. Do not use floor polish or wax that makes floors slippery. If you must use wax, use non-skid floor wax. Do not have throw rugs and other things on the floor that can make you trip. What can I do with my stairs? Do not leave any items on the stairs. Make sure that there are handrails on both sides of the stairs and use them.  Fix handrails that are broken or loose. Make sure that handrails are as long as the stairways. Check any carpeting to make sure that it is firmly attached to the stairs. Fix any carpet that is loose or worn. Avoid having throw rugs at the top or bottom of the stairs. If you do have throw rugs, attach them to the floor with carpet tape. Make sure that you have a light switch at the top of the stairs and the bottom of the stairs. If you do not have them, ask someone to add them for you. What else can I do to help prevent falls? Wear shoes that: Do not have high heels. Have rubber bottoms. Are comfortable and fit you well. Are closed at the toe. Do not wear sandals. If you use a stepladder: Make sure that it is fully opened. Do not  climb a closed stepladder. Make sure that both sides of the stepladder are locked into place. Ask someone to hold it for you, if possible. Clearly mark and make sure that you can see: Any grab bars or handrails. First and last steps. Where the edge of each step is. Use tools that help you move around (mobility aids) if they are needed. These include: Canes. Walkers. Scooters. Crutches. Turn on the lights when you go into a dark area. Replace any light bulbs as soon as they burn out. Set up your furniture so you have a clear path. Avoid moving your furniture around. If any of your floors are uneven, fix them. If there are any pets around you, be aware of where they are. Review your medicines with your doctor. Some medicines can make you feel dizzy. This can increase your chance of falling. Ask your doctor what other things that you can do to help prevent falls. This information is not intended to replace advice given to you by your health care provider. Make sure you discuss any questions you have with your health care provider. Document Released: 12/19/2008 Document Revised: 07/31/2015 Document Reviewed: 03/29/2014 Elsevier Interactive Patient Education  2017  [employer].

## 2021-02-06 ENCOUNTER — Ambulatory Visit: Payer: Medicare Other | Admitting: Urology

## 2021-02-06 ENCOUNTER — Encounter: Payer: Self-pay | Admitting: Urology

## 2021-02-06 VITALS — BP 139/80 | HR 98

## 2021-02-06 DIAGNOSIS — N390 Urinary tract infection, site not specified: Secondary | ICD-10-CM

## 2021-02-06 DIAGNOSIS — N3001 Acute cystitis with hematuria: Secondary | ICD-10-CM

## 2021-02-06 DIAGNOSIS — R3129 Other microscopic hematuria: Secondary | ICD-10-CM

## 2021-02-06 DIAGNOSIS — N200 Calculus of kidney: Secondary | ICD-10-CM | POA: Diagnosis not present

## 2021-02-06 LAB — URINALYSIS, ROUTINE W REFLEX MICROSCOPIC
Bilirubin, UA: NEGATIVE
Glucose, UA: NEGATIVE
Ketones, UA: NEGATIVE
Nitrite, UA: NEGATIVE
Specific Gravity, UA: 1.015 (ref 1.005–1.030)
Urobilinogen, Ur: 0.2 mg/dL (ref 0.2–1.0)
pH, UA: 5.5 (ref 5.0–7.5)

## 2021-02-06 LAB — MICROSCOPIC EXAMINATION
Epithelial Cells (non renal): >10 /hpf — AB (ref 0–10)
Renal Epithel, UA: NONE SEEN /hpf
WBC, UA: >30 /hpf — AB (ref 0–5)

## 2021-02-06 MED ORDER — SULFAMETHOXAZOLE-TRIMETHOPRIM 800-160 MG PO TABS: 1.0000 | ORAL_TABLET | Freq: Two times a day (BID) | ORAL | 0 refills | Status: DC

## 2021-02-06 MED ORDER — MIRABEGRON ER 25 MG PO TB24: 25.0000 mg | ORAL_TABLET | Freq: Every day | ORAL | 0 refills | Status: DC

## 2021-02-06 NOTE — Progress Notes (Signed)
02/06/2021 10:58 AM   [name] 03/31/1950 [id number]  Referring provider: Fayrene Helper, MD [address] [place],  [place] [address]  Followup nephrolithiasis and UTI   HPI: [name] is a 70yo here for followup for recurrent UTI and nephrolithiasis. She has no recent renal imaging and did not get her KUB. She has been treated 4 times since last visit for a UTI. UA today is concerning for infection. She has chronic bilateral flank pain and known bilateral lower pole renal calculi.    PMH: Past Medical History:  Diagnosis Date   BACK PAIN, THORACIC REGION, RIGHT 01/27/2010   Qualifier: Diagnosis of  By: Moshe Cipro MD, Jerrel Ivory of foot 07/15/2017   Essential hypertension    Headache(784.0)    Hematuria 02/24/2016   History of kidney stones    Hyperlipidemia    Irregular heart beats    Microscopic hematuria 02/24/2016   Neck pain on right side 01/23/2013   Piles (hemorrhoids) 06/22/2011   Renal calculus, right 05/31/2016   Shoulder pain, right 01/25/2014   SKIN TAG 06/13/2008   Qualifier: Diagnosis of  By: Moshe Cipro MD, Margaret     TIA (transient ischemic attack) 06/19/2016   Type 2 diabetes mellitus ([place])    Vaginitis     Surgical History: Past Surgical History:  Procedure Laterality Date   COLONOSCOPY N/A 10/26/2018   Procedure: COLONOSCOPY;  Surgeon: Rogene Houston, MD;  [facility];  Service: Endoscopy;  Laterality: N/A;  830-10:30am   cyst removed Right 1998   cyst- removed from right wrist   CYSTOSCOPY WITH RETROGRADE PYELOGRAM, URETEROSCOPY AND STENT PLACEMENT Right 05/19/2020   Procedure: CYSTOSCOPY WITH RETROGRADE PYELOGRAM, URETEROSCOPY AND STENT PLACEMENT;  Surgeon: Cleon Gustin, MD;  Location: [facility];  Service: Urology;  Laterality: Right;   CYSTOSCOPY WITH RETROGRADE PYELOGRAM, URETEROSCOPY AND STENT PLACEMENT Right 06/26/2020   Procedure: CYSTOSCOPY WITH RETROGRADE PYELOGRAM, URETEROSCOPY AND STENT EXCHANGE;   Surgeon: Cleon Gustin, MD;  Location: [facility];  Service: Urology;  Laterality: Right;   EXTRACORPOREAL SHOCK WAVE LITHOTRIPSY Right 05/29/2018   Procedure: EXTRACORPOREAL SHOCK WAVE LITHOTRIPSY (ESWL);  Surgeon: Kathie Rhodes, MD;  Location: [facility];  Service: Urology;  Laterality: Right;   HOLMIUM LASER APPLICATION Right 7/74/1287   Procedure: HOLMIUM LASER APPLICATION;  Surgeon: Cleon Gustin, MD;  Location: [facility];  Service: Urology;  Laterality: Right;   lithotrpsy     over 20 years   NEPHROLITHOTOMY Right 05/31/2016   Procedure: NEPHROLITHOTOMY PERCUTANEOUS WITH SURGEON ACCESS;  Surgeon: Cleon Gustin, MD;  Location: [facility];  Service: Urology;  Laterality: Right;   POLYPECTOMY  10/26/2018   Procedure: POLYPECTOMY;  Surgeon: Rogene Houston, MD;  [facility];  Service: Endoscopy;;  colon   TOTAL ABDOMINAL HYSTERECTOMY W/ BILATERAL SALPINGOOPHORECTOMY  1998   TUBAL LIGATION      Home Medications:  Allergies as of 02/06/2021       Reactions   Onglyza [saxagliptin Hydrochloride] Swelling   Tongue swell   Pravastatin Other (See Comments)   Muscle aches   Ace Inhibitors Cough        Medication List        Accurate as of February 06, 2021 10:58 AM. If you have any questions, ask your nurse or doctor.          acetaminophen 500 MG tablet Commonly known as: TYLENOL Take 1,000 mg by mouth every 6 (six) hours as needed for  headache or moderate pain.   aspirin EC 81 MG tablet Take 1 tablet (81 mg total) by mouth daily.   atorvastatin 10 MG tablet Commonly known as: LIPITOR Take one tablet by mouth every Monday, Wednesday and Friday, and take half tablet , every Tuesday, Thursday, Saturday and Sunday What changed:  how much to take how to take this when to take this additional instructions   B-D ULTRAFINE III SHORT PEN 31G X 8 MM Misc Generic drug: Insulin Pen Needle FOR USE WITH INSULIN ONCE DAILY DX E11.9   clotrimazole-betamethasone  cream Commonly known as: LOTRISONE Apply 1 application topically 2 (two) times daily.   estradiol 0.1 MG/GM vaginal cream Commonly known as: ESTRACE 1 Gm nightly vaginally for 2 weeks and  then 2-3 x's weekly   ferrous sulfate 325 (65 FE) MG tablet Take 325 mg by mouth 2 (two) times daily with a meal.   hydroquinone 4 % cream Apply topically 2 (two) times daily.   Lantus SoloStar 100 UNIT/ML Solostar Pen Generic drug: insulin glargine INJECT 22 UNITS INTO THE SKIN DAILY AT 10 PM.   losartan 25 MG tablet Commonly known as: COZAAR Take half tablet once daily by mouth   Lubricant Eye Drops 0.4-0.3 % Soln Generic drug: Polyethyl Glycol-Propyl Glycol Place 1 drop into both eyes 3 (three) times daily as needed (dry/irritated eyes.).   metFORMIN 1000 MG tablet Commonly known as: GLUCOPHAGE Take 1 tablet (1,000 mg total) by mouth 2 (two) times daily with a meal.   OneTouch Delica Lancets 16X Misc Once daily dx e11.9 (give lancects that go with pts meter)   OneTouch Ultra test strip Generic drug: glucose blood USE TO TEST BLOOD SUGAR 3 TIMES A DAY AS DIRECTED DX E11.65   Vitamin D3 50 MCG (2000 UT) Tabs Take 2,000 Units by mouth daily.        Allergies:  Allergies  Allergen Reactions   Onglyza [Saxagliptin Hydrochloride] Swelling    Tongue swell   Pravastatin Other (See Comments)    Muscle aches   Ace Inhibitors Cough    Family History: Family History  Problem Relation Age of Onset   Alcohol abuse Mother    Esophageal cancer Father    Alcohol abuse Father    Diabetes Sister    Diabetes Sister    Hypertension Sister    Heart attack Sister        in 55's   Seizures Brother     Social History:  reports that she has never smoked. She has never used smokeless tobacco. She reports that she does not drink alcohol and does not use drugs.  ROS: All other review of systems were reviewed and are negative except what is noted above in HPI  Physical Exam: BP 139/80    Pulse 98   Constitutional:  Alert and oriented, No acute distress. HEENT: [place] AT, moist mucus membranes.  Trachea midline, no masses. Cardiovascular: No clubbing, cyanosis, or edema. Respiratory: Normal respiratory effort, no increased work of breathing. GI: Abdomen is soft, nontender, nondistended, no abdominal masses GU: No CVA tenderness.  Lymph: No cervical or inguinal lymphadenopathy. Skin: No rashes, bruises or suspicious lesions. Neurologic: Grossly intact, no focal deficits, moving all 4 extremities. Psychiatric: Normal mood and affect.  Laboratory Data: Lab Results  Component Value Date   WBC 5.2 05/15/2020   HGB 12.3 05/15/2020   HCT 41.0 05/15/2020   MCV 94.3 05/15/2020   PLT 423 (H) 05/15/2020    Lab Results  Component Value  Date   CREATININE 0.69 02/03/2021    No results found for: PSA  No results found for: TESTOSTERONE  Lab Results  Component Value Date   HGBA1C 7.4 (H) 02/03/2021    Urinalysis    Component Value Date/Time   COLORURINE YELLOW 05/15/2020 1243   APPEARANCEUR Cloudy (A) 01/08/2021 1647   LABSPEC 1.016 05/15/2020 1243   PHURINE 6.0 05/15/2020 1243   GLUCOSEU 2+ (A) 01/08/2021 1647   HGBUR LARGE (A) 05/15/2020 1243   BILIRUBINUR Negative 01/08/2021 1647   KETONESUR negative 11/11/2020 1551   KETONESUR 5 (A) 05/15/2020 1243   PROTEINUR 1+ (A) 01/08/2021 1647   PROTEINUR 100 (A) 05/15/2020 1243   UROBILINOGEN 0.2 11/11/2020 1551   UROBILINOGEN 0.2 01/25/2014 0845   NITRITE Negative 01/08/2021 1647   NITRITE NEGATIVE 05/15/2020 1243   LEUKOCYTESUR 3+ (A) 01/08/2021 1647   LEUKOCYTESUR LARGE (A) 05/15/2020 1243    Lab Results  Component Value Date   LABMICR See below: 01/08/2021   WBCUA >30 (A) 01/08/2021   LABEPIT 0-10 01/08/2021   MUCUS Present 08/28/2020   BACTERIA Moderate (A) 01/08/2021    Pertinent Imaging:  Results for orders placed during the hospital encounter of 12/04/20  Abdomen 1 view  (KUB)  Narrative CLINICAL DATA:  History of nephrolithiasis  EXAM: ABDOMEN - 1 VIEW  COMPARISON:  Ultrasound from 09/02/2020  FINDINGS: Scattered large and small bowel gas is noted. The kidneys are somewhat obscured by overlying fecal material. 6 mm calcification is noted over the lower pole of the left kidney relatively stable from the prior exam. There is a triangular shaped calcification measuring 18 mm in the expected region of the proximal left ureter. The right kidney is obscured.  IMPRESSION: Left lower pole and possible left renal pelvic/proximal ureteral stone. CT urography may be helpful for further evaluation.   Electronically Signed By: Inez Catalina M.D. On: 12/06/2020 03:44  No results found for this or any previous visit.  No results found for this or any previous visit.  No results found for this or any previous visit.  Results for orders placed during the hospital encounter of 09/02/20  Ultrasound renal complete  Narrative CLINICAL DATA:  Nephrolithiasis  EXAM: RENAL / URINARY TRACT ULTRASOUND COMPLETE  COMPARISON:  July 05, 2019 February 14, 2020  FINDINGS: Right Kidney:  Renal measurements: 10.5 x 5.0 x 6.2 cm = volume: 168 mL. Echogenicity within normal limits. There is decreased pelviectasis in comparison to prior. No hydronephrosis. Mild prominence of the proximal ureter, similar in comparison prior. Previously described nephrolithiasis within the renal pelvis is no longer visualized within the pelvis. There is a 1.4 cm nephrolithiasis within the inferior pole of the RIGHT kidney.  Left Kidney:  Renal measurements: 11.4 x 7.5 x 5.2 cm = volume: 231 mL. Echogenicity within normal limits. No hydronephrosis. There is a 19 mm nephrolithiasis within the inferior pole of the LEFT kidney.  Bladder:  Appears normal for degree of bladder distention.  Other:  None.  IMPRESSION: 1. Decreased pelviectasis of the RIGHT kidney in  comparison to prior. No frank hydronephrosis. 2. There are bilateral nephrolithiasis.   Electronically Signed By: Valentino Saxon MD On: 09/03/2020 10:30  No results found for this or any previous visit.  No results found for this or any previous visit.  No results found for this or any previous visit.   Assessment & Plan:    1. Recurrent UTI -urine for culture, will call with results -Bactrim DS BID for 14 days.  We will likely keep her on bactrim until her surgery - Urinalysis, Routine w reflex microscopic  2. Nephrolithiasis -KUB today, will call with results. We will scheduled for left ureteroscopy based on KUB   No follow-ups on file.  [name], MD  [facility] Urology [place]

## 2021-02-06 NOTE — Progress Notes (Signed)

## 2021-02-06 NOTE — Patient Instructions (Signed)
Dietary Guidelines to Help Prevent Kidney Stones Kidney stones are deposits of minerals and salts that form inside your kidneys. Your risk of developing kidney stones may be greater depending on your diet, your lifestyle, the medicines you take, and whether you have certain medical conditions. Most people can lower their chances of developing kidney stones by following the instructions below. Your dietitian may give you more specific instructions depending on your overall health and the type of kidney stones you tend to develop. What are tips for following this plan? Reading food labels  Choose foods with "no salt added" or "low-salt" labels. Limit your salt (sodium) intake to less than 1,500 mg a day. Choose foods with calcium for each meal and snack. Try to eat about 300 mg of calcium at each meal. Foods that contain 200-500 mg of calcium a serving include: 8 oz (237 mL) of milk, calcium-fortifiednon-dairy milk, and calcium-fortifiedfruit juice. Calcium-fortified means that calcium has been added to these drinks. 8 oz (237 mL) of kefir, yogurt, and soy yogurt. 4 oz (114 g) of tofu. 1 oz (28 g) of cheese. 1 cup (150 g) of dried figs. 1 cup (91 g) of cooked broccoli. One 3 oz (85 g) can of sardines or mackerel. Most people need 1,000-1,500 mg of calcium a day. Talk to your dietitian about how much calcium is recommended for you. Shopping Buy plenty of fresh fruits and vegetables. Most people do not need to avoid fruits and vegetables, even if these foods contain nutrients that may contribute to kidney stones. When shopping for convenience foods, choose: Whole pieces of fruit. Pre-made salads with dressing on the side. Low-fat fruit and yogurt smoothies. Avoid buying frozen meals or prepared deli foods. These can be high in sodium. Look for foods with live cultures, such as yogurt and kefir. Choose high-fiber grains, such as whole-wheat breads, oat bran, and wheat cereals. Cooking Do not add  salt to food when cooking. Place a salt shaker on the table and allow each person to add his or her own salt to taste. Use vegetable protein, such as beans, textured vegetable protein (TVP), or tofu, instead of meat in pasta, casseroles, and soups. Meal planning Eat less salt, if told by your dietitian. To do this: Avoid eating processed or pre-made food. Avoid eating fast food. Eat less animal protein, including cheese, meat, poultry, or fish, if told by your dietitian. To do this: Limit the number of times you have meat, poultry, fish, or cheese each week. Eat a diet free of meat at least 2 days a week. Eat only one serving each day of meat, poultry, fish, or seafood. When you prepare animal protein, cut pieces into small portion sizes. For most meat and fish, one serving is about the size of the palm of your hand. Eat at least five servings of fresh fruits and vegetables each day. To do this: Keep fruits and vegetables on hand for snacks. Eat one piece of fruit or a handful of berries with breakfast. Have a salad and fruit at lunch. Have two kinds of vegetables at dinner. Limit foods that are high in a substance called oxalate. These include: Spinach (cooked), rhubarb, beets, sweet potatoes, and Swiss chard. Peanuts. Potato chips, french fries, and baked potatoes with skin on. Nuts and nut products. Chocolate. If you regularly take a diuretic medicine, make sure to eat at least 1 or 2 servings of fruits or vegetables that are high in potassium each day. These include: Avocado. Banana. Orange, prune,   carrot, or tomato juice. Baked potato. Cabbage. Beans and split peas. Lifestyle  Drink enough fluid to keep your urine pale yellow. This is the most important thing you can do. Spread your fluid intake throughout the day. If you drink alcohol: Limit how much you use to: 0-1 drink a day for women who are not pregnant. 0-2 drinks a day for men. Be aware of how much alcohol is in your  drink. In the [place] one drink equals one 12 oz bottle of beer (355 mL), one 5 oz glass of wine (148 mL), or one 1 oz glass of hard liquor (44 mL). Lose weight if told by your health care provider. Work with your dietitian to find an eating plan and weight loss strategies that work best for you. General information Talk to your health care provider and dietitian about taking daily supplements. You may be told the following depending on your health and the cause of your kidney stones: Not to take supplements with vitamin C. To take a calcium supplement. To take a daily probiotic supplement. To take other supplements such as magnesium, fish oil, or vitamin B6. Take over-the-counter and prescription medicines only as told by your health care provider. These include supplements. What foods should I limit? Limit your intake of the following foods, or eat them as told by your dietitian. Vegetables Spinach. Rhubarb. Beets. Canned vegetables. Pickles. Olives. Baked potatoes with skin. Grains Wheat bran. Baked goods. Salted crackers. Cereals high in sugar. Meats and other proteins Nuts. Nut butters. Large portions of meat, poultry, or fish. Salted, precooked, or cured meats, such as sausages, meat loaves, and hot dogs. Dairy Cheese. Beverages Regular soft drinks. Regular vegetable juice. Seasonings and condiments Seasoning blends with salt. Salad dressings. Soy sauce. Ketchup. Barbecue sauce. Other foods Canned soups. Canned pasta sauce. Casseroles. Pizza. Lasagna. Frozen meals. Potato chips. French fries. The items listed above may not be a complete list of foods and beverages you should limit. Contact a dietitian for more information. What foods should I avoid? Talk to your dietitian about specific foods you should avoid based on the type of kidney stones you have and your overall health. Fruits Grapefruit. The item listed above may not be a complete list of foods and beverages you should  avoid. Contact a dietitian for more information. Summary Kidney stones are deposits of minerals and salts that form inside your kidneys. You can lower your risk of kidney stones by making changes to your diet. The most important thing you can do is drink enough fluid. Drink enough fluid to keep your urine pale yellow. Talk to your dietitian about how much calcium you should have each day, and eat less salt and animal protein as told by your dietitian. This information is not intended to replace advice given to you by your health care provider. Make sure you discuss any questions you have with your health care provider. Document Revised: 02/15/2019 Document Reviewed: 02/15/2019 Elsevier Patient Education  2022 Elsevier Inc.  

## 2021-02-11 LAB — URINE CULTURE

## 2021-02-16 ENCOUNTER — Telehealth: Payer: Self-pay

## 2021-02-16 DIAGNOSIS — N390 Urinary tract infection, site not specified: Secondary | ICD-10-CM

## 2021-02-16 MED ORDER — CIPROFLOXACIN HCL 500 MG PO TABS: 500.0000 mg | ORAL_TABLET | Freq: Two times a day (BID) | ORAL | 0 refills | Status: DC

## 2021-02-16 NOTE — Telephone Encounter (Signed)
-----   Message from Cleon Gustin, MD sent at 02/13/2021  7:50 AM EST ----- Cipro 500mg  BID for 7 days. And why did she not go get her KUB? ----- Message ----- From: Dorisann Frames, RN Sent: 02/11/2021   4:41 PM EST To: Cleon Gustin, MD  Placed on bactrim 02/06/2021

## 2021-02-16 NOTE — Telephone Encounter (Signed)
Patient called and notified of medication sent [facility] in [place].  Patient reminded of KUB order. Patient will go today to have completed.

## 2021-02-17 ENCOUNTER — Other Ambulatory Visit: Payer: Self-pay

## 2021-02-17 ENCOUNTER — Ambulatory Visit (HOSPITAL_COMMUNITY)
Admission: RE | Admit: 2021-02-17 | Discharge: 2021-02-17 | Disposition: A | Discharge status: Home / Self Care | Payer: Medicare Other | Source: Ambulatory Visit | Attending: Urology | Admitting: Urology

## 2021-02-17 DIAGNOSIS — N390 Urinary tract infection, site not specified: Secondary | ICD-10-CM | POA: Diagnosis present

## 2021-02-17 DIAGNOSIS — N200 Calculus of kidney: Secondary | ICD-10-CM | POA: Insufficient documentation

## 2021-03-05 ENCOUNTER — Other Ambulatory Visit: Payer: Self-pay

## 2021-03-05 ENCOUNTER — Ambulatory Visit: Payer: Medicare Other

## 2021-03-05 LAB — HM DIABETES EYE EXAM

## 2021-04-13 ENCOUNTER — Other Ambulatory Visit: Payer: Self-pay

## 2021-04-13 MED ORDER — ONETOUCH ULTRA VI STRP: ORAL_STRIP | 5 refills | Status: DC

## 2021-04-28 ENCOUNTER — Other Ambulatory Visit: Payer: Self-pay | Admitting: Family Medicine

## 2021-05-07 ENCOUNTER — Other Ambulatory Visit: Payer: Self-pay | Admitting: Family Medicine

## 2021-05-11 ENCOUNTER — Telehealth: Payer: Self-pay

## 2021-05-11 NOTE — Telephone Encounter (Signed)
Patient came by the office said no one has contacted her about her eye exam. Please contact [phone]. ?

## 2021-05-11 NOTE — Telephone Encounter (Signed)
Called pt no answer and vm full- unable to leave message  ?

## 2021-05-13 ENCOUNTER — Ambulatory Visit: Payer: Medicare Other | Admitting: Family Medicine

## 2021-05-14 NOTE — Telephone Encounter (Signed)
Pt aware it was normal  ?

## 2021-05-20 ENCOUNTER — Encounter: Payer: Self-pay | Admitting: Family Medicine

## 2021-05-20 ENCOUNTER — Ambulatory Visit (INDEPENDENT_AMBULATORY_CARE_PROVIDER_SITE_OTHER): Payer: Medicare Other | Admitting: Family Medicine

## 2021-05-20 ENCOUNTER — Other Ambulatory Visit: Payer: Self-pay

## 2021-05-20 VITALS — BP 132/82 | HR 65 | Resp 16 | Ht 64.0 in | Wt 125.1 lb

## 2021-05-20 DIAGNOSIS — I10 Essential (primary) hypertension: Secondary | ICD-10-CM | POA: Diagnosis not present

## 2021-05-20 DIAGNOSIS — E1169 Type 2 diabetes mellitus with other specified complication: Secondary | ICD-10-CM

## 2021-05-20 DIAGNOSIS — N3090 Cystitis, unspecified without hematuria: Secondary | ICD-10-CM

## 2021-05-20 DIAGNOSIS — Z794 Long term (current) use of insulin: Secondary | ICD-10-CM

## 2021-05-20 DIAGNOSIS — E782 Mixed hyperlipidemia: Secondary | ICD-10-CM | POA: Diagnosis not present

## 2021-05-20 LAB — POCT URINALYSIS DIP (CLINITEK)
Bilirubin, UA: NEGATIVE
Glucose, UA: NEGATIVE mg/dL
Ketones, POC UA: NEGATIVE mg/dL
Nitrite, UA: NEGATIVE
POC PROTEIN,UA: 100 — AB
Spec Grav, UA: 1.015 (ref 1.010–1.025)
Urobilinogen, UA: 0.2 E.U./dL
pH, UA: 5.5 (ref 5.0–8.0)

## 2021-05-20 MED ORDER — FERROUS SULFATE 325 (65 FE) MG PO TABS: 325.0000 mg | ORAL_TABLET | Freq: Two times a day (BID) | ORAL | 0 refills | Status: DC

## 2021-05-20 MED ORDER — MIRABEGRON ER 25 MG PO TB24: 25.0000 mg | ORAL_TABLET | Freq: Every day | ORAL | 5 refills | Status: DC

## 2021-05-20 NOTE — Patient Instructions (Addendum)
Annual exam with foot exam in 3 months, call if you need me sooner ? ?CCUA and microalb today along withl labs already ordered ? ?Pls schedule April mammogram at checkout ? ?Pls give pt diabetic log book for her to use at home ? ?It is important that you exercise regularly at least 30 minutes 5 times a week. If you develop chest pain, have severe difficulty breathing, or feel very tired, stop exercising immediately and seek medical attention  ?Think about what you will eat, plan ahead. ?Choose " clean, green, fresh or frozen" over canned, processed or packaged foods which are more sugary, salty and fatty. ?70 to 75% of food eaten should be vegetables and fruit. ?Three meals at set times with snacks allowed between meals, but they must be fruit or vegetables. ?Aim to eat over a 12 hour period , example 7 am to 7 pm, and STOP after  your last meal of the day. ?Drink water,generally about 64 ounces per day, no other drink is as healthy. Fruit juice is best enjoyed in a healthy way, by EATING the fruit. ? ?Thanks for choosing [facility], we consider it a privelige to serve you. ? ? ? ?

## 2021-05-21 LAB — CMP14+EGFR
ALT: 11 IU/L (ref 0–32)
AST: 15 IU/L (ref 0–40)
Albumin/Globulin Ratio: 2 (ref 1.2–2.2)
Albumin: 4.8 g/dL — ABNORMAL HIGH (ref 3.7–4.7)
Alkaline Phosphatase: 92 IU/L (ref 44–121)
BUN/Creatinine Ratio: 25 (ref 12–28)
BUN: 19 mg/dL (ref 8–27)
Bilirubin Total: 0.5 mg/dL (ref 0.0–1.2)
CO2: 30 mmol/L — ABNORMAL HIGH (ref 20–29)
Calcium: 9.7 mg/dL (ref 8.7–10.3)
Chloride: 101 mmol/L (ref 96–106)
Creatinine, Ser: 0.75 mg/dL (ref 0.57–1.00)
Globulin, Total: 2.4 g/dL (ref 1.5–4.5)
Glucose: 60 mg/dL — ABNORMAL LOW (ref 70–99)
Potassium: 3.7 mmol/L (ref 3.5–5.2)
Sodium: 145 mmol/L — ABNORMAL HIGH (ref 134–144)
Total Protein: 7.2 g/dL (ref 6.0–8.5)
eGFR: 85 mL/min/{1.73_m2} (ref >59)

## 2021-05-21 LAB — LIPID PANEL
Chol/HDL Ratio: 2 ratio (ref 0.0–4.4)
Cholesterol, Total: 176 mg/dL (ref 100–199)
HDL: 86 mg/dL (ref >39)
LDL Chol Calc (NIH): 81 mg/dL (ref 0–99)
Triglycerides: 43 mg/dL (ref 0–149)
VLDL Cholesterol Cal: 9 mg/dL (ref 5–40)

## 2021-05-21 LAB — HEMOGLOBIN A1C
Est. average glucose Bld gHb Est-mCnc: 169 mg/dL
Hgb A1c MFr Bld: 7.5 % — ABNORMAL HIGH (ref 4.8–5.6)

## 2021-05-22 ENCOUNTER — Encounter: Payer: Self-pay | Admitting: Family Medicine

## 2021-05-22 LAB — URINE CULTURE

## 2021-05-22 LAB — MICROALBUMIN, URINE: Microalbumin, Urine: 204.2 ug/mL

## 2021-05-22 MED ORDER — INSULIN GLARGINE 100 UNITS/ML SOLOSTAR PEN: 30.0000 [IU] | PEN_INJECTOR | Freq: Every day | SUBCUTANEOUS | 11 refills | Status: DC

## 2021-05-22 NOTE — Progress Notes (Signed)
Called pt no VM.

## 2021-05-22 NOTE — Assessment & Plan Note (Signed)
Abn CCUA, send for c/s, no antibiotic until report available ?

## 2021-05-22 NOTE — Assessment & Plan Note (Signed)
Hyperlipidemia:Low fat diet discussed and encouraged. ? ? ?Lipid Panel  ?Lab Results  ?Component Value Date  ? CHOL 176 05/20/2021  ? HDL 86 05/20/2021  ? [place] 81 05/20/2021  ? TRIG 43 05/20/2021  ? CHOLHDL 2.0 05/20/2021  ? ?Controlled, no change in medication ? ? ? ?

## 2021-05-22 NOTE — Assessment & Plan Note (Signed)
Ms. Apperson is reminded of the importance of commitment to daily physical activity for 30 minutes or more, as able and the need to limit carbohydrate intake to 30 to 60 grams per meal to help with blood sugar control.  ? ?The need to take medication as prescribed, test blood sugar as directed, and to call between visits if there is a concern that blood sugar is uncontrolled is also discussed.  ? ?Ms. Plaut is reminded of the importance of daily foot exam, annual eye examination, and good blood sugar, blood pressure and cholesterol control. ? ?Diabetic Labs Latest Ref Rng & Units 05/20/2021 02/03/2021 10/28/2020 05/15/2020 02/04/2020  ?HbA1c 4.8 - 5.6 % 7.5(H) 7.4(H) 7.5(H) 7.1(H) 7.7(H)  ?Microalbumin Not Estab. ug/mL - - - - -  ?Micro/Creat Ratio <30 mcg/mg creat - - - - -  ?Chol 100 - 199 mg/dL 176 198 - - 203(H)  ?HDL >39 mg/dL 86 78 - - 81  ?Calc LDL 0 - 99 mg/dL 81 108(H) - - 108(H)  ?Triglycerides 0 - 149 mg/dL 43 65 - - 78  ?Creatinine 0.57 - 1.00 mg/dL 0.75 0.69 0.69 0.85 0.82  ? ?BP/Weight 05/20/2021 02/06/2021 02/03/2021 11/13/2020 11/04/2020 06/26/2020 06/17/2020  ?Systolic BP 774 128 786 767 122 121 120  ?Diastolic BP 82 80 83 82 78 64 70  ?Wt. (Lbs) 125.12 - 126.12 128 127.4 - 129.4  ?BMI 21.48 - 21.65 21.97 21.87 - 22.21  ? ?Foot/eye exam completion dates Latest Ref Rng & Units 03/05/2021 06/17/2020  ?Eye Exam No Retinopathy No Retinopathy -  ?Foot exam Order - - -  ?Foot Form Completion - - Done  ? ? ? ? ?Not at goal increase lantus to 25 units daily ?

## 2021-05-22 NOTE — Assessment & Plan Note (Signed)
DASH diet and commitment to daily physical activity for a minimum of 30 minutes discussed and encouraged, as a part of hypertension management. ?The importance of attaining a healthy weight is also discussed. ? ?BP/Weight 05/20/2021 02/06/2021 02/03/2021 11/13/2020 11/04/2020 06/26/2020 06/17/2020  ?Systolic BP 757 322 567 209 122 121 120  ?Diastolic BP 82 80 83 82 78 64 70  ?Wt. (Lbs) 125.12 - 126.12 128 127.4 - 129.4  ?BMI 21.48 - 21.65 21.97 21.87 - 22.21  ? ? ? ?Controlled, no change in medication ? ?

## 2021-05-22 NOTE — Progress Notes (Signed)
? [name]     MRN: [id number]      DOB: 1950/04/08 ? ? ?HPI ?[name] is here for follow up and re-evaluation of chronic medical conditions, medication management and review of any available recent lab and radiology data.  ?Preventive health is updated, specifically  Cancer screening and Immunization.   ?Questions or concerns regarding consultations or procedures which the PT has had in the interim are  addressed. ?The PT denies any adverse reactions to current medications since the last visit.  ?There are no new concerns.  ?There are no specific complaints  ?Denies polyuria, polydipsia, blurred vision , or hypoglycemic episodes. ? ? ?ROS ?Denies recent fever or chills. ?Denies sinus pressure, nasal congestion, ear pain or sore throat. ?Denies chest congestion, productive cough or wheezing. ?Denies chest pains, palpitations and leg swelling ?Denies abdominal pain, nausea, vomiting,diarrhea or constipation.   ?Denies dysuria, , hesitancy or incontinence.c/o malodorous urine and frequency ?Denies joint pain, swelling and limitation in mobility. ?Denies headaches, seizures, numbness, or tingling. ?Denies depression, anxiety or insomnia. ?Denies skin break down or rash. ? ? ?PE ? ?BP 132/82   Pulse 65   Resp 16   Ht '5\' 4"'$  (1.626 m)   Wt 125 lb 1.9 oz (56.8 kg)   SpO2 94%   BMI 21.48 kg/m?  ? ?Patient alert and oriented and in no cardiopulmonary distress. ? ?HEENT: No facial asymmetry, EOMI,     Neck supple . ? ?Chest: Clear to auscultation bilaterally. ? ?CVS: S1, S2 no murmurs, no S3.Regular rate. ? ?ABD: Soft non tender.  ? ?Ext: No edema ? ?MS: Adequate ROM spine, shoulders, hips and knees. ? ?Skin: Intact, no ulcerations or rash noted. ? ?Psych: Good eye contact, normal affect. Memory intact not anxious or depressed appearing. ? ?CNS: CN 2-12 intact, power,  normal throughout.no focal deficits noted. ? ? ?Assessment & Plan ? ?Type 2 diabetes mellitus with other specified complication ([place]) ?Ms.  Craig is reminded of the importance of commitment to daily physical activity for 30 minutes or more, as able and the need to limit carbohydrate intake to 30 to 60 grams per meal to help with blood sugar control.  ? ?The need to take medication as prescribed, test blood sugar as directed, and to call between visits if there is a concern that blood sugar is uncontrolled is also discussed.  ? ?[name] is reminded of the importance of daily foot exam, annual eye examination, and good blood sugar, blood pressure and cholesterol control. ? ?Diabetic Labs Latest Ref Rng & Units 05/20/2021 02/03/2021 10/28/2020 05/15/2020 02/04/2020  ?HbA1c 4.8 - 5.6 % 7.5(H) 7.4(H) 7.5(H) 7.1(H) 7.7(H)  ?Microalbumin Not Estab. ug/mL - - - - -  ?Micro/Creat Ratio <30 mcg/mg creat - - - - -  ?Chol 100 - 199 mg/dL 176 198 - - 203(H)  ?HDL >39 mg/dL 86 78 - - 81  ?Calc LDL 0 - 99 mg/dL 81 108(H) - - 108(H)  ?Triglycerides 0 - 149 mg/dL 43 65 - - 78  ?Creatinine 0.57 - 1.00 mg/dL 0.75 0.69 0.69 0.85 0.82  ? ?BP/Weight 05/20/2021 02/06/2021 02/03/2021 11/13/2020 11/04/2020 06/26/2020 06/17/2020  ?Systolic BP 338 250 539 767 122 121 120  ?Diastolic BP 82 80 83 82 78 64 70  ?Wt. (Lbs) 125.12 - 126.12 128 127.4 - 129.4  ?BMI 21.48 - 21.65 21.97 21.87 - 22.21  ? ?Foot/eye exam completion dates Latest Ref Rng & Units 03/05/2021 06/17/2020  ?Eye Exam No Retinopathy No Retinopathy -  ?  Foot exam Order - - -  ?Foot Form Completion - - Done  ? ? ? ? ?Not at goal increase lantus to 25 units daily ? ?Essential hypertension ?DASH diet and commitment to daily physical activity for a minimum of 30 minutes discussed and encouraged, as a part of hypertension management. ?The importance of attaining a healthy weight is also discussed. ? ?BP/Weight 05/20/2021 02/06/2021 02/03/2021 11/13/2020 11/04/2020 06/26/2020 06/17/2020  ?Systolic BP 443 154 008 676 122 121 120  ?Diastolic BP 82 80 83 82 78 64 70  ?Wt. (Lbs) 125.12 - 126.12 128 127.4 - 129.4  ?BMI 21.48 - 21.65 21.97 21.87  - 22.21  ? ? ? ?Controlled, no change in medication ? ? ?Cystitis ?Abn CCUA, send for c/s, no antibiotic until report available ? ?Hyperlipidemia ?Hyperlipidemia:Low fat diet discussed and encouraged. ? ? ?Lipid Panel  ?Lab Results  ?Component Value Date  ? CHOL 176 05/20/2021  ? HDL 86 05/20/2021  ? [place] 81 05/20/2021  ? TRIG 43 05/20/2021  ? CHOLHDL 2.0 05/20/2021  ? ?Controlled, no change in medication ? ? ? ? ?

## 2021-05-31 ENCOUNTER — Other Ambulatory Visit: Payer: Self-pay | Admitting: Family Medicine

## 2021-06-25 ENCOUNTER — Encounter: Payer: Self-pay | Admitting: Internal Medicine

## 2021-06-25 ENCOUNTER — Ambulatory Visit (INDEPENDENT_AMBULATORY_CARE_PROVIDER_SITE_OTHER): Payer: Medicare Other | Admitting: Internal Medicine

## 2021-06-25 DIAGNOSIS — N3000 Acute cystitis without hematuria: Secondary | ICD-10-CM

## 2021-06-25 LAB — POCT URINALYSIS DIP (CLINITEK)
Bilirubin, UA: NEGATIVE
Glucose, UA: NEGATIVE mg/dL
Nitrite, UA: NEGATIVE
POC PROTEIN,UA: 100 — AB
Spec Grav, UA: 1.025 (ref 1.010–1.025)
Urobilinogen, UA: 0.2 E.U./dL
pH, UA: 5.5 (ref 5.0–8.0)

## 2021-06-25 MED ORDER — SULFAMETHOXAZOLE-TRIMETHOPRIM 800-160 MG PO TABS: 1.0000 | ORAL_TABLET | Freq: Two times a day (BID) | ORAL | 0 refills | Status: DC

## 2021-06-25 NOTE — Progress Notes (Signed)
?  ? ?Virtual Visit via Telephone Note  ? ?This visit type was conducted due to national recommendations for restrictions regarding the COVID-19 Pandemic (e.g. social distancing) in an effort to limit this patient's exposure and mitigate transmission in our community.  Due to her co-morbid illnesses, this patient is at least at moderate risk for complications without adequate follow up.  This format is felt to be most appropriate for this patient at this time.  The patient did not have access to video technology/had technical difficulties with video requiring transitioning to audio format only (telephone).  All issues noted in this document were discussed and addressed.  No physical exam could be performed with this format. ? ?Evaluation Performed:  Follow-up visit ? ?Date:  06/25/2021  ? ?ID:  [name], DOB August 20, 1950, MRN [id number] ? ?Patient Location: Home ?Provider Location: Office/Clinic ? ?Participants: Patient ?Location of Patient: Home ?Location of Provider: Telehealth ?Consent was obtain for visit to be over via telehealth. ?I verified that I am speaking with the correct person using two identifiers. ? ?PCP:  Fayrene Helper, MD  ? ?Chief Complaint: Dysuria ? ?History of Present Illness:   ? ?[name] is a 71 y.o. female who has a televisit for complaint of dysuria, lower abdominal pressure and fever with malodorous urine X 1 week.  She also reports lack of appetite.  She denies any flank pain, hematuria, nausea or vomiting currently. ? ?The patient does not have symptoms concerning for COVID-19 infection (fever, chills, cough, or new shortness of breath).  ? ?Past Medical, Surgical, Social History, Allergies, and Medications have been Reviewed. ? ?Past Medical History:  ?Diagnosis Date  ? BACK PAIN, THORACIC REGION, RIGHT 01/27/2010  ? Qualifier: Diagnosis of  By: Moshe Cipro MD, Joycelyn Schmid    ? Callus of foot 07/15/2017  ? Essential hypertension   ? Headache(784.0)   ? Hematuria 02/24/2016  ?  History of kidney stones   ? Hyperlipidemia   ? Irregular heart beats   ? Microscopic hematuria 02/24/2016  ? Neck pain on right side 01/23/2013  ? Piles (hemorrhoids) 06/22/2011  ? Renal calculus, right 05/31/2016  ? Shoulder pain, right 01/25/2014  ? SKIN TAG 06/13/2008  ? Qualifier: Diagnosis of  By: Moshe Cipro MD, Joycelyn Schmid    ? TIA (transient ischemic attack) 06/19/2016  ? Type 2 diabetes mellitus ([place])   ? Vaginitis   ? ?Past Surgical History:  ?Procedure Laterality Date  ? COLONOSCOPY N/A 10/26/2018  ? Procedure: COLONOSCOPY;  Surgeon: Rogene Houston, MD;  [facility];  Service: Endoscopy;  Laterality: N/A;  830-10:30am  ? cyst removed Right 1998  ? cyst- removed from right wrist  ? CYSTOSCOPY WITH RETROGRADE PYELOGRAM, URETEROSCOPY AND STENT PLACEMENT Right 05/19/2020  ? Procedure: CYSTOSCOPY WITH RETROGRADE PYELOGRAM, URETEROSCOPY AND STENT PLACEMENT;  Surgeon: Cleon Gustin, MD;  Location: [facility];  Service: Urology;  Laterality: Right;  ? CYSTOSCOPY WITH RETROGRADE PYELOGRAM, URETEROSCOPY AND STENT PLACEMENT Right 06/26/2020  ? Procedure: CYSTOSCOPY WITH RETROGRADE PYELOGRAM, URETEROSCOPY AND STENT EXCHANGE;  Surgeon: Cleon Gustin, MD;  Location: [facility];  Service: Urology;  Laterality: Right;  ? EXTRACORPOREAL SHOCK WAVE LITHOTRIPSY Right 05/29/2018  ? Procedure: EXTRACORPOREAL SHOCK WAVE LITHOTRIPSY (ESWL);  Surgeon: Kathie Rhodes, MD;  [facility];  Service: Urology;  Laterality: Right;  ? HOLMIUM LASER APPLICATION Right 03/13/2692  ? Procedure: HOLMIUM LASER APPLICATION;  Surgeon: Cleon Gustin, MD;  Location: [facility];  Service: Urology;  Laterality: Right;  ? lithotrpsy    ?  over 20 years  ? NEPHROLITHOTOMY Right 05/31/2016  ? Procedure: NEPHROLITHOTOMY PERCUTANEOUS WITH SURGEON ACCESS;  Surgeon: Cleon Gustin, MD;  [facility];  Service: Urology;  Laterality: Right;  ? POLYPECTOMY  10/26/2018  ? Procedure: POLYPECTOMY;  Surgeon: Rogene Houston, MD;  [facility];  Service: Endoscopy;;  colon  ? TOTAL ABDOMINAL HYSTERECTOMY W/ BILATERAL SALPINGOOPHORECTOMY  1998  ? TUBAL LIGATION    ?  ? ?Current Meds  ?Medication Sig  ? acetaminophen (TYLENOL) 500 MG tablet Take 1,000 mg by mouth every 6 (six) hours as needed for headache or moderate pain.  ? aspirin EC 81 MG tablet Take 1 tablet (81 mg total) by mouth daily.  ? atorvastatin (LIPITOR) 10 MG tablet TAKE ONE TABLET BY MOUTH EVERY MON, WED, AND FRI, AND HALF A TABLET TUES, THURS, SAT, AND SUN  ? B-D ULTRAFINE III SHORT PEN 31G X 8 MM MISC FOR USE WITH INSULIN ONCE DAILY DX E11.9  ? Cholecalciferol (VITAMIN D3) 50 MCG (2000 UT) TABS Take 2,000 Units by mouth daily.  ? clotrimazole-betamethasone (LOTRISONE) cream Apply 1 application topically 2 (two) times daily.  ? ferrous sulfate 325 (65 FE) MG tablet TAKE 1 TABLET BY MOUTH 2 TIMES DAILY WITH A MEAL.  ? glucose blood (ONETOUCH ULTRA) test strip Use as instructed  ? insulin glargine (LANTUS) 100 unit/mL SOPN Inject 30 Units into the skin daily.  ? losartan (COZAAR) 25 MG tablet Take half tablet once daily by mouth  ? metFORMIN (GLUCOPHAGE) 1000 MG tablet TAKE 1 TABLET (1,000 MG TOTAL) BY MOUTH 2 (TWO) TIMES DAILY WITH A MEAL.  ? mirabegron ER (MYRBETRIQ) 25 MG TB24 tablet Take 1 tablet (25 mg total) by mouth daily.  ? OneTouch Delica Lancets 30Q MISC Once daily dx e11.9 (give lancects that go with pts meter)  ? Polyethyl Glycol-Propyl Glycol (LUBRICANT EYE DROPS) 0.4-0.3 % SOLN Place 1 drop into both eyes 3 (three) times daily as needed (dry/irritated eyes.).  ? sulfamethoxazole-trimethoprim (BACTRIM DS) 800-160 MG tablet Take 1 tablet by mouth 2 (two) times daily.  ?  ? ?Allergies:   Onglyza [saxagliptin hydrochloride], Pravastatin, and Ace inhibitors  ? ?ROS:   ?Please see the history of present illness.    ? ?All other systems reviewed and are negative. ? ? ?Labs/Other Tests and Data Reviewed:   ? ?Recent Labs: ?05/20/2021: ALT 11; BUN 19; Creatinine, Ser 0.75;  Potassium 3.7; Sodium 145  ? ?Recent Lipid Panel ?Lab Results  ?Component Value Date/Time  ? CHOL 176 05/20/2021 09:53 AM  ? TRIG 43 05/20/2021 09:53 AM  ? HDL 86 05/20/2021 09:53 AM  ? CHOLHDL 2.0 05/20/2021 09:53 AM  ? CHOLHDL 2.5 06/29/2019 01:03 PM  ? LDLCALC 81 05/20/2021 09:53 AM  ? LDLCALC 116 (H) 06/29/2019 01:03 PM  ? ? ?Wt Readings from Last 3 Encounters:  ?05/20/21 125 lb 1.9 oz (56.8 kg)  ?02/03/21 126 lb 1.9 oz (57.2 kg)  ?11/16/20 128 lb (58.1 kg)  ?  ? ?ASSESSMENT & PLAN:   ? ?UTI ?UA reviewed -small LE and large blood ?Started empiric Bactrim ?Check urine culture ?Advised to increase fluid intake to at least 64 ounces of fluid in a day ? ? ?Time:   ?Today, I have spent 9 minutes reviewing the chart, including problem list, medications, and with the patient with telehealth technology discussing the above problems. ? ? ?Medication Adjustments/Labs and Tests Ordered: ?Current medicines are reviewed at length with the patient today.  Concerns regarding medicines  are outlined above.  ? ?Tests Ordered: ?Orders Placed This Encounter  ?Procedures  ? Urine Culture  ? POCT URINALYSIS DIP (CLINITEK)  ? ? ?Medication Changes: ?Meds ordered this encounter  ?Medications  ? sulfamethoxazole-trimethoprim (BACTRIM DS) 800-160 MG tablet  ?  Sig: Take 1 tablet by mouth 2 (two) times daily.  ?  Dispense:  10 tablet  ?  Refill:  0  ? ? ? ?Note: This dictation was prepared with Dragon dictation along with smaller phrase technology. Similar sounding words can be transcribed inadequately or may not be corrected upon review. Any transcriptional errors that result from this process are unintentional.  ?  ? ? ?Disposition:  Follow up  ?Signed, ?Lindell Spar, MD  ?06/25/2021 3:45 PM    ? ?[facility] ?

## 2021-07-01 MED ORDER — NITROFURANTOIN MONOHYD MACRO 100 MG PO CAPS
100.0000 mg | ORAL_CAPSULE | Freq: Two times a day (BID) | ORAL | 0 refills | Status: DC
Start: 2021-07-01 — End: 2021-08-21

## 2021-07-01 NOTE — Addendum Note (Signed)
Addended byIhor Dow on: 07/01/2021 08:02 AM ? ? Modules accepted: Orders ? ?

## 2021-07-02 LAB — URINE CULTURE

## 2021-07-22 ENCOUNTER — Encounter: Payer: Self-pay | Admitting: Urology

## 2021-07-22 ENCOUNTER — Ambulatory Visit (INDEPENDENT_AMBULATORY_CARE_PROVIDER_SITE_OTHER): Payer: Medicare Other | Admitting: Urology

## 2021-07-22 VITALS — BP 128/68 | HR 101

## 2021-07-22 DIAGNOSIS — Z87440 Personal history of urinary (tract) infections: Secondary | ICD-10-CM | POA: Diagnosis not present

## 2021-07-22 DIAGNOSIS — N390 Urinary tract infection, site not specified: Secondary | ICD-10-CM

## 2021-07-22 DIAGNOSIS — N200 Calculus of kidney: Secondary | ICD-10-CM

## 2021-07-22 MED ORDER — NITROFURANTOIN MACROCRYSTAL 50 MG PO CAPS: 50.0000 mg | ORAL_CAPSULE | Freq: Four times a day (QID) | ORAL | 11 refills | Status: DC

## 2021-07-22 NOTE — Progress Notes (Signed)
? ?07/22/2021 ?11:54 AM  ? ?[name] ?12-21-1950 ?[id number] ? ?Referring provider: Fayrene Helper, MD ?[address] ?[place],  [place] [address] ? ?Followup nephrolithiasis and recurrent UTI ? ?HPI: ?[name] is a 71yo here for followup for nephrolithiasis and recurrent UTI. NO stone events since last visit. She has mild intermittent left flank. KUB from 02/2021 shows an 101m and 825mleft renal calculus. NO hematuria or dysuria. She has had 1 UTI diagnosed in 06/2021. She was initially treated with Bactrim and then switched macrobid. No significant LUTS today.No other complaints today ? ? ?PMH: ?Past Medical History:  ?Diagnosis Date  ? BACK PAIN, THORACIC REGION, RIGHT 01/27/2010  ? Qualifier: Diagnosis of  By: SiMoshe CiproD, MaJoycelyn Schmid  ? Callus of foot 07/15/2017  ? Essential hypertension   ? Headache(784.0)   ? Hematuria 02/24/2016  ? History of kidney stones   ? Hyperlipidemia   ? Irregular heart beats   ? Microscopic hematuria 02/24/2016  ? Neck pain on right side 01/23/2013  ? Piles (hemorrhoids) 06/22/2011  ? Renal calculus, right 05/31/2016  ? Shoulder pain, right 01/25/2014  ? SKIN TAG 06/13/2008  ? Qualifier: Diagnosis of  By: SiMoshe CiproD, MaJoycelyn Schmid  ? TIA (transient ischemic attack) 06/19/2016  ? Type 2 diabetes mellitus ([place]  ? Vaginitis   ? ? ?Surgical History: ?Past Surgical History:  ?Procedure Laterality Date  ? COLONOSCOPY N/A 10/26/2018  ? Procedure: COLONOSCOPY;  Surgeon: ReRogene HoustonMD;  [facility];  Service: Endoscopy;  Laterality: N/A;  830-10:30am  ? cyst removed Right 1998  ? cyst- removed from right wrist  ? CYSTOSCOPY WITH RETROGRADE PYELOGRAM, URETEROSCOPY AND STENT PLACEMENT Right 05/19/2020  ? Procedure: CYSTOSCOPY WITH RETROGRADE PYELOGRAM, URETEROSCOPY AND STENT PLACEMENT;  Surgeon: McCleon GustinMD;  [facility];  Service: Urology;  Laterality: Right;  ? CYSTOSCOPY WITH RETROGRADE PYELOGRAM, URETEROSCOPY AND STENT PLACEMENT Right 06/26/2020   ? Procedure: CYSTOSCOPY WITH RETROGRADE PYELOGRAM, URETEROSCOPY AND STENT EXCHANGE;  Surgeon: McCleon GustinMD;  [facility];  Service: Urology;  Laterality: Right;  ? EXTRACORPOREAL SHOCK WAVE LITHOTRIPSY Right 05/29/2018  ? Procedure: EXTRACORPOREAL SHOCK WAVE LITHOTRIPSY (ESWL);  Surgeon: OtKathie RhodesMD;  [facility];  Service: Urology;  Laterality: Right;  ? HOLMIUM LASER APPLICATION Right 06/07/00/5427? Procedure: HOLMIUM LASER APPLICATION;  Surgeon: McCleon GustinMD;  [facility];  Service: Urology;  Laterality: Right;  ? lithotrpsy    ? over 20 years  ? NEPHROLITHOTOMY Right 05/31/2016  ? Procedure: NEPHROLITHOTOMY PERCUTANEOUS WITH SURGEON ACCESS;  Surgeon: PaCleon GustinMD;  [facility];  Service: Urology;  Laterality: Right;  ? POLYPECTOMY  10/26/2018  ? Procedure: POLYPECTOMY;  Surgeon: ReRogene HoustonMD;  [facility];  Service: Endoscopy;;  colon  ? TOTAL ABDOMINAL HYSTERECTOMY W/ BILATERAL SALPINGOOPHORECTOMY  1998  ? TUBAL LIGATION    ? ? ?Home Medications:  ?Allergies as of 07/22/2021   ? ?   Reactions  ? Onglyza [saxagliptin Hydrochloride] Swelling  ? Tongue swell  ? Pravastatin Other (See Comments)  ? Muscle aches  ? Ace Inhibitors Cough  ? ?  ? ?  ?Medication List  ?  ? ?  ? Accurate as of Jul 22, 2021 11:54 AM. If you have any questions, ask your nurse or doctor.  ?  ?  ? ?  ? ?acetaminophen 500 MG tablet ?Commonly known as: TYLENOL ?Take 1,000 mg by  mouth every 6 (six) hours as needed for headache or moderate pain. ?  ?aspirin EC 81 MG tablet ?Take 1 tablet (81 mg total) by mouth daily. ?  ?atorvastatin 10 MG tablet ?Commonly known as: LIPITOR ?TAKE ONE TABLET BY MOUTH EVERY MON, WED, AND FRI, AND HALF A TABLET TUES, THURS, SAT, AND SUN ?  ?B-D ULTRAFINE III SHORT PEN 31G X 8 MM Misc ?Generic drug: Insulin Pen Needle ?FOR USE WITH INSULIN ONCE DAILY DX E11.9 ?  ?clotrimazole-betamethasone cream ?Commonly known as: LOTRISONE ?Apply 1 application  topically 2 (two) times daily. ?  ?ferrous sulfate 325 (65 FE) MG tablet ?TAKE 1 TABLET BY MOUTH 2 TIMES DAILY WITH A MEAL. ?  ?insulin glargine 100 unit/mL Sopn ?Commonly known as: LANTUS ?Inject 30 Units into the skin daily. ?  ?losartan 25 MG tablet ?Commonly known as: COZAAR ?Take half tablet once daily by mouth ?  ?Lubricant Eye Drops 0.4-0.3 % Soln ?Generic drug: Polyethyl Glycol-Propyl Glycol ?Place 1 drop into both eyes 3 (three) times daily as needed (dry/irritated eyes.). ?  ?metFORMIN 1000 MG tablet ?Commonly known as: GLUCOPHAGE ?TAKE 1 TABLET (1,000 MG TOTAL) BY MOUTH 2 (TWO) TIMES DAILY WITH A MEAL. ?  ?mirabegron ER 25 MG Tb24 tablet ?Commonly known as: MYRBETRIQ ?Take 1 tablet (25 mg total) by mouth daily. ?  ?nitrofurantoin (macrocrystal-monohydrate) 100 MG capsule ?Commonly known as: Macrobid ?Take 1 capsule (100 mg total) by mouth 2 (two) times daily. ?  ?OneTouch Delica Lancets 80D Misc ?Once daily dx e11.9 (give lancects that go with pts meter) ?  ?OneTouch Ultra test strip ?Generic drug: glucose blood ?Use as instructed ?  ?sulfamethoxazole-trimethoprim 800-160 MG tablet ?Commonly known as: BACTRIM DS ?Take 1 tablet by mouth 2 (two) times daily. ?  ?Vitamin D3 50 MCG (2000 UT) Tabs ?Take 2,000 Units by mouth daily. ?  ? ?  ? ? ?Allergies:  ?Allergies  ?Allergen Reactions  ? Onglyza [Saxagliptin Hydrochloride] Swelling  ?  Tongue swell  ? Pravastatin Other (See Comments)  ?  Muscle aches  ? Ace Inhibitors Cough  ? ? ?Family History: ?Family History  ?Problem Relation Age of Onset  ? Alcohol abuse Mother   ? Esophageal cancer Father   ? Alcohol abuse Father   ? Diabetes Sister   ? Diabetes Sister   ? Hypertension Sister   ? Heart attack Sister   ?     in 34's  ? Seizures Brother   ? ? ?Social History:  reports that she has never smoked. She has never used smokeless tobacco. She reports that she does not drink alcohol and does not use drugs. ? ?ROS: ?All other review of systems were reviewed and  are negative except what is noted above in HPI ? ?Physical Exam: ?BP 128/68   Pulse (!) 101   ?Constitutional:  Alert and oriented, No acute distress. ?HEENT: [place] AT, moist mucus membranes.  Trachea midline, no masses. ?Cardiovascular: No clubbing, cyanosis, or edema. ?Respiratory: Normal respiratory effort, no increased work of breathing. ?GI: Abdomen is soft, nontender, nondistended, no abdominal masses ?GU: No CVA tenderness.  ?Lymph: No cervical or inguinal lymphadenopathy. ?Skin: No rashes, bruises or suspicious lesions. ?Neurologic: Grossly intact, no focal deficits, moving all 4 extremities. ?Psychiatric: Normal mood and affect. ? ?Laboratory Data: ?Lab Results  ?Component Value Date  ? WBC 5.2 05/15/2020  ? HGB 12.3 05/15/2020  ? HCT 41.0 05/15/2020  ? MCV 94.3 05/15/2020  ? PLT 423 (H) 05/15/2020  ? ? ?Lab Results  ?  Component Value Date  ? CREATININE 0.75 05/20/2021  ? ? ?No results found for: PSA ? ?No results found for: TESTOSTERONE ? ?Lab Results  ?Component Value Date  ? HGBA1C 7.5 (H) 05/20/2021  ? ? ?Urinalysis ?   ?Component Value Date/Time  ? COLORURINE YELLOW 05/15/2020 1243  ? APPEARANCEUR Cloudy (A) 02/06/2021 1043  ? LABSPEC 1.016 05/15/2020 1243  ? PHURINE 6.0 05/15/2020 1243  ? GLUCOSEU Negative 02/06/2021 1043  ? HGBUR LARGE (A) 05/15/2020 1243  ? BILIRUBINUR negative 06/25/2021 1347  ? BILIRUBINUR Negative 02/06/2021 1043  ? KETONESUR small (15) (A) 06/25/2021 1347  ? KETONESUR 5 (A) 05/15/2020 1243  ? PROTEINUR 1+ (A) 02/06/2021 1043  ? PROTEINUR 100 (A) 05/15/2020 1243  ? UROBILINOGEN 0.2 06/25/2021 1347  ? UROBILINOGEN 0.2 01/25/2014 0845  ? NITRITE Negative 06/25/2021 1347  ? NITRITE Negative 02/06/2021 1043  ? NITRITE NEGATIVE 05/15/2020 1243  ? LEUKOCYTESUR Small (1+) (A) 06/25/2021 1347  ? LEUKOCYTESUR 3+ (A) 02/06/2021 1043  ? LEUKOCYTESUR LARGE (A) 05/15/2020 1243  ? ? ?Lab Results  ?Component Value Date  ? LABMICR 204.2 05/20/2021  ? WBCUA >30 (A) 02/06/2021  ? LABEPIT >10 (A)  02/06/2021  ? MUCUS Present 02/06/2021  ? BACTERIA Moderate (A) 02/06/2021  ? ? ?Pertinent Imaging: ? ?Results for orders placed in visit on 02/06/21 ? ?Abdomen 1 view (KUB) ? ?Narrative ?CLINICAL DATA:  Hi

## 2021-07-22 NOTE — Patient Instructions (Signed)
Ureteroscopy ?Ureteroscopy is a procedure to check for and treat problems inside part of the urinary tract. In this procedure, a thin, flexible tube with a light at the end (ureteroscope) is used to look at the inside of the kidneys and the ureters. The ureters are the tubes that carry urine from the kidneys to the bladder. The ureteroscope is inserted into one or both of the ureters. ?You may need this procedure if you have frequent urinary tract infections (UTIs), blood in your urine, or a stone in one of your ureters. A ureteroscopy can be done: ?To find the cause of urine blockage in a ureter and to evaluate other abnormalities inside the ureters or kidneys. ?To remove stones. ?To remove or treat growths of tissue (polyps), abnormal tissue, and some types of tumors. ?To remove a tissue sample and check it for disease under a microscope (biopsy). ?Tell a health care provider about: ?Any allergies you have. ?All medicines you are taking, including vitamins, herbs, eye drops, creams, and over-the-counter medicines. ?Any problems you or family members have had with anesthetic medicines. ?Any blood disorders you have. ?Any surgeries you have had. ?Any medical conditions you have. ?Whether you are pregnant or may be pregnant. ?What are the risks? ?Generally, this is a safe procedure. However, problems may occur, including: ?Bleeding. ?Infection. ?Allergic reactions to medicines. ?Scarring that narrows the ureter (stricture). ?Creating a hole in the ureter (perforation). ?What happens before the procedure? ?Staying hydrated ?Follow instructions from your health care provider about hydration, which may include: ?Up to 2 hours before the procedure - you may continue to drink clear liquids, such as water, clear fruit juice, black coffee, and plain tea. ? ?Eating and drinking restrictions ?Follow instructions from your health care provider about eating and drinking, which may include: ?8 hours before the procedure - stop  eating heavy meals or foods, such as meat, fried foods, or fatty foods. ?6 hours before the procedure - stop eating light meals or foods, such as toast or cereal. ?6 hours before the procedure - stop drinking milk or drinks that contain milk. ?2 hours before the procedure - stop drinking clear liquids. ?Medicines ?Ask your health care provider about: ?Changing or stopping your regular medicines. This is especially important if you are taking diabetes medicines or blood thinners. ?Taking medicines such as aspirin and ibuprofen. These medicines can thin your blood. Do not take these medicines unless your health care provider tells you to take them. ?Taking over-the-counter medicines, vitamins, herbs, and supplements. ?General instructions ?Do not use any products that contain nicotine or tobacco for at least 4 weeks before the procedure. These products include cigarettes, e-cigarettes, and chewing tobacco. If you need help quitting, ask your health care provider. ?You may have a urine sample taken to check for infection. ?Plan to have someone take you home from the hospital or clinic. ?If you will be going home right after the procedure, plan to have someone with you for 24 hours. ?Ask your health care provider what steps will be taken to help prevent infection. These may include: ?Washing skin with a germ-killing soap. ?Receiving antibiotic medicine. ?What happens during the procedure? ? ?An IV will be inserted into one of your veins. ?You will be given one or more of the following: ?A medicine to help you relax (sedative). ?A medicine to make you fall asleep (general anesthetic). ?A medicine that is injected into your spine to numb the area below and slightly above the injection site (spinal anesthetic). ?The  part of your body that drains urine from your bladder (urethra) will be cleaned with a germ-killing solution. ?The ureteroscope will be passed through your urethra into your bladder. ?A salt-water solution will  be sent through the ureteroscope to fill your bladder. This will help the health care provider see the openings of your ureters more clearly. ?The ureteroscope will be passed into your ureter. ?If a growth is found, a biopsy may be done. ?If a stone is found, it may be removed through the ureteroscope, or the stone may be broken up using a laser, shock waves, or electrical energy. ?In some cases, if the ureter is too small, a tube may be inserted that keeps the ureter open (ureteral stent). The stent may be left in place for 1 or 2 weeks to keep the ureter open, and then the ureteroscopy procedure will be done. ?The scope will be removed, and your bladder will be emptied. ?The procedure may vary among health care providers and hospitals. ?What can I expect after the procedure? ?After your procedure, it is common to have: ?Your blood pressure, heart rate, breathing rate, and blood oxygen level monitored until you leave the hospital or clinic. ?A burning sensation when you urinate. You may be asked to urinate. ?Blood in your urine. ?Mild discomfort in your bladder area or kidney area when urinating. ?A need to urinate more often or urgently. ?Follow these instructions at home: ?Medicines ?Take over-the-counter and prescription medicines only as told by your health care provider. ?If you were prescribed an antibiotic medicine, take it as told by your health care provider. Do not stop taking the antibiotic even if you start to feel better. ?General instructions ? ?If you were given a sedative during the procedure, it can affect you for several hours. Do not drive or operate machinery until your health care provider says that it is safe. ?To relieve burning, take a warm bath or hold a warm washcloth over your groin. ?Drink enough fluid to keep your urine pale yellow. ?Drink two 8-ounce (237 mL) glasses of water every hour for the first 2 hours after you get home. ?Continue to drink water often at home. ?You can eat what  you normally do. ?Keep all follow-up visits as told by your health care provider. This is important. ?If you had a ureteral stent placed, ask your health care provider when you need to return to have it removed. ?Contact a health care provider if you have: ?Chills or a fever. ?Burning pain for longer than 24 hours after the procedure. ?Blood in your urine for longer than 24 hours after the procedure. ?Get help right away if you have: ?Large amounts of blood in your urine. ?Blood clots in your urine. ?Severe pain. ?Chest pain or trouble breathing. ?The feeling of a full bladder and you are unable to urinate. ?These symptoms may represent a serious problem that is an emergency. Do not wait to see if the symptoms will go away. Get medical help right away. Call your local emergency services (911 in the [place] ?Summary ?Ureteroscopy is a procedure to check for and treat problems inside part of the urinary tract. ?In this procedure, a thin, flexible tube with a light at the end (ureteroscope) is used to look at the inside of the kidneys and the ureters. ?You may need this procedure if you have frequent urinary tract infections (UTIs), blood in your urine, or a stone in a ureter. ?This information is not intended to replace advice given to  you by your health care provider. Make sure you discuss any questions you have with your health care provider. ?Document Revised: 01/22/2021 Document Reviewed: 11/29/2018 ?Elsevier Patient Education ? [place]. ? ?

## 2021-07-29 ENCOUNTER — Other Ambulatory Visit: Payer: Self-pay

## 2021-07-29 DIAGNOSIS — N390 Urinary tract infection, site not specified: Secondary | ICD-10-CM

## 2021-07-29 LAB — URINE CULTURE

## 2021-07-29 MED ORDER — NITROFURANTOIN MACROCRYSTAL 50 MG PO CAPS: 50.0000 mg | ORAL_CAPSULE | Freq: Every day | ORAL | 11 refills | Status: DC

## 2021-07-29 MED ORDER — NITROFURANTOIN MONOHYD MACRO 100 MG PO CAPS: 100.0000 mg | ORAL_CAPSULE | Freq: Two times a day (BID) | ORAL | 0 refills | Status: DC

## 2021-07-29 NOTE — Progress Notes (Signed)
Patient called and made aware of abt sent to pharmacy.

## 2021-08-21 ENCOUNTER — Encounter: Payer: Self-pay | Admitting: Family Medicine

## 2021-08-21 ENCOUNTER — Ambulatory Visit (INDEPENDENT_AMBULATORY_CARE_PROVIDER_SITE_OTHER): Payer: Medicare Other | Admitting: Family Medicine

## 2021-08-21 VITALS — BP 156/87 | HR 93 | Resp 16 | Ht 63.5 in | Wt 132.0 lb

## 2021-08-21 DIAGNOSIS — N3090 Cystitis, unspecified without hematuria: Secondary | ICD-10-CM

## 2021-08-21 DIAGNOSIS — Z0001 Encounter for general adult medical examination with abnormal findings: Secondary | ICD-10-CM | POA: Diagnosis not present

## 2021-08-21 DIAGNOSIS — I10 Essential (primary) hypertension: Secondary | ICD-10-CM | POA: Diagnosis not present

## 2021-08-21 DIAGNOSIS — N3001 Acute cystitis with hematuria: Secondary | ICD-10-CM | POA: Diagnosis not present

## 2021-08-21 DIAGNOSIS — E1169 Type 2 diabetes mellitus with other specified complication: Secondary | ICD-10-CM

## 2021-08-21 DIAGNOSIS — Z794 Long term (current) use of insulin: Secondary | ICD-10-CM

## 2021-08-21 DIAGNOSIS — Z1231 Encounter for screening mammogram for malignant neoplasm of breast: Secondary | ICD-10-CM

## 2021-08-21 LAB — POCT URINALYSIS DIP (CLINITEK)
Bilirubin, UA: NEGATIVE
Glucose, UA: NEGATIVE mg/dL
Ketones, POC UA: NEGATIVE mg/dL
Nitrite, UA: NEGATIVE
POC PROTEIN,UA: 100 — AB
Spec Grav, UA: 1.02 (ref 1.010–1.025)
Urobilinogen, UA: 0.2 E.U./dL
pH, UA: 7 (ref 5.0–8.0)

## 2021-08-21 MED ORDER — LOSARTAN POTASSIUM 25 MG PO TABS: ORAL_TABLET | ORAL | 1 refills | Status: DC

## 2021-08-21 NOTE — Patient Instructions (Addendum)
F/U in 6  weeks,  re evaluate blood pressure, call if you need me sooner  Please schedule mammogram at checkout  Blood pressure is high, increase losartan 25 mg to ONE tablet once daily  Labs today , CBC, chem 7 and EGFR, hBA1C, TSH, CCUA, send for c/s if abn  Please get  your shingrix vaccines at the pharmacy  Thanks for choosing [facility], we consider it a privelige to serve you.

## 2021-08-21 NOTE — Progress Notes (Unsigned)
    [name]     MRN: [id number]      DOB: 07-25-1950  HPI: Patient is in for annual physical exam. Feels cold often Dysuria and frequency currently with recurrent UTI Wants to get off metformin Uncontrolled blood pressure is addressed Labs today Immunization is reviewed , and  needs shingrix.States she plans to get this   PE: BP (!) 156/87   Pulse 93   Resp 16   Ht 5' 3.5" (1.613 m)   Wt 132 lb (59.9 kg)   SpO2 96%   BMI 23.02 kg/m   Pleasant  female, alert and oriented x 3, in no cardio-pulmonary distress. Afebrile. HEENT No facial trauma or asymetry. Sinuses non tender.  Extra occullar muscles intact.. External ears normal, . Neck: supple, no adenopathy,JVD or thyromegaly.No bruits.  Chest: Clear to ascultation bilaterally.No crackles or wheezes. Non tender to palpation  Breast: No mass or nipple discharge. Right nipple inverted which is chronic. No assymetry of breasts Noaxillary or supraclavicular nodes  Cardiovascular system; Heart sounds normal,  S1 and  S2 ,no S3.  No murmur, or thrill. Apical beat not displaced Peripheral pulses normal.  Abdomen: Soft, non tender, no organomegaly or masses. No bruits. Bowel sounds normal. No guarding, tenderness or rebound.     Musculoskeletal exam: Full ROM of spine, hips , shoulders and knees. No deformity ,swelling or crepitus noted. No muscle wasting or atrophy.   Neurologic: Cranial nerves 2 to 12 intact. Power, tone ,sensation  normal throughout. No disturbance in gait. No tremor.  Skin: Intact, no ulceration, erythema , scaling or rash noted. Pigmentation normal throughout  Psych; Normal mood and affect. Judgement and concentration normal   Assessment & Plan:  Annual visit for general adult medical examination with abnormal findings Annual exam as documented. Counseling done  re healthy lifestyle involving commitment to 150 minutes exercise per week, heart healthy diet, and attaining  healthy weight.The importance of adequate sleep also discussed. Regular seat belt use and home safety, is also discussed. Changes in health habits are decided on by the patient with goals and time frames  set for achieving them. Immunization and cancer screening needs are specifically addressed at this visit.   Essential hypertension uncontroled , inc losartan dose and re eval in 6 to 8 weeks DASH diet and commitment to daily physical activity for a minimum of 30 minutes discussed and encouraged, as a part of hypertension management. The importance of attaining a healthy weight is also discussed.     08/21/2021    8:43 AM 07/22/2021   11:31 AM 05/20/2021    9:25 AM 05/20/2021    8:45 AM 02/06/2021   10:33 AM 02/03/2021    8:57 AM 11/16/2020    1:51 PM  BP/Weight  Systolic BP 537 482 707 867 544 920 100  Diastolic BP 87 68 82 85 80 83 82  Wt. (Lbs) 132   125.12  126.12 128  BMI 23.02 kg/m2   21.48 kg/m2  21.65 kg/m2 21.97 kg/m2       Cystitis Mildly symptomatic with recurrent UTI. Specimen tesyed in offfice abn, so sent for c/s

## 2021-08-23 LAB — URINE CULTURE

## 2021-08-25 ENCOUNTER — Encounter: Payer: Self-pay | Admitting: Family Medicine

## 2021-08-25 LAB — CBC
Hematocrit: 35 % (ref 34.0–46.6)
Hemoglobin: 11.7 g/dL (ref 11.1–15.9)
MCH: 29.5 pg (ref 26.6–33.0)
MCHC: 33.4 g/dL (ref 31.5–35.7)
MCV: 88 fL (ref 79–97)
Platelets: 324 10*3/uL (ref 150–450)
RBC: 3.97 x10E6/uL (ref 3.77–5.28)
RDW: 11.9 % (ref 11.7–15.4)
WBC: 4.3 10*3/uL (ref 3.4–10.8)

## 2021-08-25 LAB — BMP8+EGFR
BUN/Creatinine Ratio: 25 (ref 12–28)
BUN: 16 mg/dL (ref 8–27)
CO2: 26 mmol/L (ref 20–29)
Calcium: 9.7 mg/dL (ref 8.7–10.3)
Chloride: 102 mmol/L (ref 96–106)
Creatinine, Ser: 0.63 mg/dL (ref 0.57–1.00)
Glucose: 49 mg/dL — ABNORMAL LOW (ref 70–99)
Potassium: 3.5 mmol/L (ref 3.5–5.2)
Sodium: 143 mmol/L (ref 134–144)
eGFR: 94 mL/min/{1.73_m2} (ref >59)

## 2021-08-25 LAB — TSH: TSH: 0.896 u[IU]/mL (ref 0.450–4.500)

## 2021-08-25 LAB — HEMOGLOBIN A1C
Est. average glucose Bld gHb Est-mCnc: 160 mg/dL
Hgb A1c MFr Bld: 7.2 % — ABNORMAL HIGH (ref 4.8–5.6)

## 2021-08-25 NOTE — Assessment & Plan Note (Signed)
Mildly symptomatic with recurrent UTI. Specimen tesyed in offfice abn, so sent for c/s

## 2021-08-25 NOTE — Assessment & Plan Note (Signed)
uncontroled , inc losartan dose and re eval in 6 to 8 weeks DASH diet and commitment to daily physical activity for a minimum of 30 minutes discussed and encouraged, as a part of hypertension management. The importance of attaining a healthy weight is also discussed.     08/21/2021    8:43 AM 07/22/2021   11:31 AM 05/20/2021    9:25 AM 05/20/2021    8:45 AM 02/06/2021   10:33 AM 02/03/2021    8:57 AM 11/16/2020    1:51 PM  BP/Weight  Systolic BP 270 350 093 818 299 371 696  Diastolic BP 87 68 82 85 80 83 82  Wt. (Lbs) 132   125.12  126.12 128  BMI 23.02 kg/m2   21.48 kg/m2  21.65 kg/m2 21.97 kg/m2

## 2021-08-25 NOTE — Assessment & Plan Note (Signed)

## 2021-08-31 ENCOUNTER — Other Ambulatory Visit: Payer: Self-pay | Admitting: Family Medicine

## 2021-08-31 ENCOUNTER — Ambulatory Visit (HOSPITAL_COMMUNITY): Payer: Medicare Other

## 2021-09-03 ENCOUNTER — Ambulatory Visit (HOSPITAL_COMMUNITY): Payer: Medicare Other

## 2021-09-04 ENCOUNTER — Other Ambulatory Visit: Payer: Self-pay | Admitting: Family Medicine

## 2021-09-25 ENCOUNTER — Encounter (INDEPENDENT_AMBULATORY_CARE_PROVIDER_SITE_OTHER): Payer: Self-pay | Admitting: *Deleted

## 2021-10-01 ENCOUNTER — Ambulatory Visit (INDEPENDENT_AMBULATORY_CARE_PROVIDER_SITE_OTHER): Payer: Medicare Other | Admitting: Nurse Practitioner

## 2021-10-01 ENCOUNTER — Encounter: Payer: Self-pay | Admitting: Nurse Practitioner

## 2021-10-01 VITALS — BP 135/84 | HR 87 | Temp 97.6°F | Ht 63.0 in | Wt 132.0 lb

## 2021-10-01 DIAGNOSIS — R350 Frequency of micturition: Secondary | ICD-10-CM | POA: Insufficient documentation

## 2021-10-01 DIAGNOSIS — N3090 Cystitis, unspecified without hematuria: Secondary | ICD-10-CM | POA: Diagnosis not present

## 2021-10-01 DIAGNOSIS — Z87440 Personal history of urinary (tract) infections: Secondary | ICD-10-CM | POA: Diagnosis not present

## 2021-10-01 LAB — POCT URINALYSIS DIP (CLINITEK)
Bilirubin, UA: NEGATIVE
Glucose, UA: NEGATIVE mg/dL
Ketones, POC UA: NEGATIVE mg/dL
Nitrite, UA: NEGATIVE
POC PROTEIN,UA: 100 — AB
Spec Grav, UA: 1.02 (ref 1.010–1.025)
Urobilinogen, UA: 0.2 E.U./dL
pH, UA: 5.5 (ref 5.0–8.0)

## 2021-10-01 NOTE — Assessment & Plan Note (Addendum)
History of recurrent UTI Followed by urology Urine dipstick today shows moderate leukocyte, negative nitrite and large blood, Will send urine to the lab for culture Patient told to call the urologist office,  there was a plan for the patient to be on Macrobid 50 mg daily at bedtime which she has not been taking.  Has plans for uterescopy  in 6 months Take Tylenol 650 mg every 6 hours as needed for fever Patient told to drink 64 ounces of water daily

## 2021-10-01 NOTE — Progress Notes (Signed)
   [name]     MRN: [id number]      DOB: 28-Mar-1950   HPI [name] with past medical history of recurrent UTI, type 2 diabetes, nephrolithiasis, hypertension is here for complaint of urinary frequency, burning urination, mild flank pain ,fatigue, chills since about 2 weeks.  Patient denies bloody urine, fever, incontinence, abdominal pain.  Last saw urology about 2 months ago and she was treated with Macrobid 100 mg twice daily.  Notes from urology states that they would do a trial of Macrobid 50 mg daily at bedtime.  Patient reports that she is currently not taking Macrobid.         ROS Denies recent fever or chills. Denies sinus pressure, nasal congestion, ear pain or sore throat. Denies chest congestion, productive cough or wheezing. Denies chest pains, palpitations and leg swelling Denies abdominal pain, nausea, vomiting,diarrhea or constipation.   Denies joint pain, swelling and limitation in mobility. Denies headaches, seizures, numbness, or tingling. Denies depression, anxiety or insomnia.    PE  BP 135/84 (BP Location: Right Arm, Cuff Size: Normal)   Pulse 87   Temp 97.6 F (36.4 C)   Ht '5\' 3"'$  (1.6 m)   Wt 132 lb (59.9 kg)   SpO2 95%   BMI 23.38 kg/m   Patient alert and oriented and in no cardiopulmonary distress.   Chest: Clear to auscultation bilaterally.  CVS: S1, S2 no murmurs, no S3.Regular rate.  ABD: Soft non tender.   Ext: No edema  MS: Adequate ROM spine, shoulders, hips and knees.  Psych: Good eye contact, normal affect. Memory intact not anxious or depressed appearing.  CNS: CN 2-12 intact, power,  normal throughout.no focal deficits noted.   Assessment & Plan  Cystitis History of recurrent UTI Followed by urology Urine dipstick today shows moderate leukocyte, negative nitrite and large blood, Will send urine to the lab for culture Patient told to call the urologist office,  there was a plan for the patient to be on Macrobid 50  mg daily at bedtime which she has not been taking.  Has plans for uterescopy  in 6 months Take Tylenol 650 mg every 6 hours as needed for fever Patient told to drink 64 ounces of water daily

## 2021-10-01 NOTE — Patient Instructions (Addendum)
Please call the urologist office as discussed today , we have sent your  urine to the lab for culture. You can take tylenol '650mg'$  every 6 hours as needed for pain and fever.      It is important that you exercise regularly at least 30 minutes 5 times a week.  Think about what you will eat, plan ahead. Choose " clean, green, fresh or frozen" over canned, processed or packaged foods which are more sugary, salty and fatty. 70 to 75% of food eaten should be vegetables and fruit. Three meals at set times with snacks allowed between meals, but they must be fruit or vegetables. Aim to eat over a 12 hour period , example 7 am to 7 pm, and STOP after  your last meal of the day. Drink water,generally about 64 ounces per day, no other drink is as healthy. Fruit juice is best enjoyed in a healthy way, by EATING the fruit.  Thanks for choosing [facility], we consider it a privelige to serve you.

## 2021-10-03 LAB — URINE CULTURE

## 2021-10-03 NOTE — Progress Notes (Signed)
No UTI but it is very important that the  patient should follow up with urology as discussed.

## 2021-10-05 ENCOUNTER — Telehealth: Payer: Self-pay

## 2021-10-05 ENCOUNTER — Ambulatory Visit (HOSPITAL_COMMUNITY)
Admission: RE | Admit: 2021-10-05 | Discharge: 2021-10-05 | Disposition: A | Discharge status: Home / Self Care | Payer: Medicare Other | Source: Ambulatory Visit | Attending: Urology | Admitting: Urology

## 2021-10-05 ENCOUNTER — Ambulatory Visit: Payer: Medicare Other | Admitting: Urology

## 2021-10-05 VITALS — BP 151/77 | HR 109

## 2021-10-05 DIAGNOSIS — N200 Calculus of kidney: Secondary | ICD-10-CM | POA: Diagnosis not present

## 2021-10-05 LAB — MICROSCOPIC EXAMINATION
RBC, Urine: >30 /hpf — AB (ref 0–2)
Renal Epithel, UA: NONE SEEN /hpf

## 2021-10-05 LAB — URINALYSIS, ROUTINE W REFLEX MICROSCOPIC
Bilirubin, UA: NEGATIVE
Nitrite, UA: NEGATIVE
Specific Gravity, UA: >1.03 — ABNORMAL HIGH (ref 1.005–1.030)
Urobilinogen, Ur: 0.2 mg/dL (ref 0.2–1.0)
pH, UA: 5 (ref 5.0–7.5)

## 2021-10-05 MED ORDER — NITROFURANTOIN MONOHYD MACRO 100 MG PO CAPS: 100.0000 mg | ORAL_CAPSULE | Freq: Two times a day (BID) | ORAL | 0 refills | Status: DC

## 2021-10-05 MED ORDER — OXYCODONE-ACETAMINOPHEN 5-325 MG PO TABS: 1.0000 | ORAL_TABLET | ORAL | 0 refills | Status: DC | PRN

## 2021-10-05 NOTE — Telephone Encounter (Signed)
Patient called about her urine lab results. Call back # [phone]

## 2021-10-05 NOTE — Progress Notes (Signed)
10/05/2021 3:14 PM   [name] 02/25/1951 [id number]  Referring provider: Fayrene Helper, MD [address] [place],  [place] [address]  nephrolithioasis   HPI: [name] is a 71yo here for followup for nephrolithiasis. She has intermittent left flank pain and urinary urgency for the past month. She has a 1.5cm distal ureteral calculus on KUB today which had migrated from the renal pelvis to the UVJ. No fevers.    PMH: Past Medical History:  Diagnosis Date   BACK PAIN, THORACIC REGION, RIGHT 01/27/2010   Qualifier: Diagnosis of  By: Moshe Cipro MD, Jerrel Ivory of foot 07/15/2017   Essential hypertension    Headache(784.0)    Hematuria 02/24/2016   History of kidney stones    Hyperlipidemia    Irregular heart beats    Microscopic hematuria 02/24/2016   Neck pain on right side 01/23/2013   Piles (hemorrhoids) 06/22/2011   Renal calculus, right 05/31/2016   Shoulder pain, right 01/25/2014   SKIN TAG 06/13/2008   Qualifier: Diagnosis of  By: Moshe Cipro MD, Margaret     TIA (transient ischemic attack) 06/19/2016   Type 2 diabetes mellitus ([place])    Vaginitis     Surgical History: Past Surgical History:  Procedure Laterality Date   COLONOSCOPY N/A 10/26/2018   Procedure: COLONOSCOPY;  Surgeon: Rogene Houston, MD;  [facility];  Service: Endoscopy;  Laterality: N/A;  830-10:30am   cyst removed Right 1998   cyst- removed from right wrist   CYSTOSCOPY WITH RETROGRADE PYELOGRAM, URETEROSCOPY AND STENT PLACEMENT Right 05/19/2020   Procedure: CYSTOSCOPY WITH RETROGRADE PYELOGRAM, URETEROSCOPY AND STENT PLACEMENT;  Surgeon: Cleon Gustin, MD;  Location: [facility];  Service: Urology;  Laterality: Right;   CYSTOSCOPY WITH RETROGRADE PYELOGRAM, URETEROSCOPY AND STENT PLACEMENT Right 06/26/2020   Procedure: CYSTOSCOPY WITH RETROGRADE PYELOGRAM, URETEROSCOPY AND STENT EXCHANGE;  Surgeon: Cleon Gustin, MD;  Location: [facility];  Service: Urology;   Laterality: Right;   EXTRACORPOREAL SHOCK WAVE LITHOTRIPSY Right 05/29/2018   Procedure: EXTRACORPOREAL SHOCK WAVE LITHOTRIPSY (ESWL);  Surgeon: Kathie Rhodes, MD;  Location: [facility];  Service: Urology;  Laterality: Right;   HOLMIUM LASER APPLICATION Right 8/65/7846   Procedure: HOLMIUM LASER APPLICATION;  Surgeon: Cleon Gustin, MD;  Location: [facility];  Service: Urology;  Laterality: Right;   lithotrpsy     over 20 years   NEPHROLITHOTOMY Right 05/31/2016   Procedure: NEPHROLITHOTOMY PERCUTANEOUS WITH SURGEON ACCESS;  Surgeon: Cleon Gustin, MD;  Location: [facility];  Service: Urology;  Laterality: Right;   POLYPECTOMY  10/26/2018   Procedure: POLYPECTOMY;  Surgeon: Rogene Houston, MD;  [facility];  Service: Endoscopy;;  colon   TOTAL ABDOMINAL HYSTERECTOMY W/ BILATERAL SALPINGOOPHORECTOMY  1998   TUBAL LIGATION      Home Medications:  Allergies as of 10/05/2021       Reactions   Onglyza [saxagliptin Hydrochloride] Swelling   Tongue swell   Pravastatin Other (See Comments)   Muscle aches   Ace Inhibitors Cough        Medication List        Accurate as of October 05, 2021  3:14 PM. If you have any questions, ask your nurse or doctor.          acetaminophen 500 MG tablet Commonly known as: TYLENOL Take 1,000 mg by mouth every 6 (six) hours as needed for headache or moderate pain.   aspirin EC 81 MG tablet Take 1  tablet (81 mg total) by mouth daily.   atorvastatin 10 MG tablet Commonly known as: LIPITOR TAKE ONE TABLET BY MOUTH EVERY MON, WED, AND FRI, AND HALF A TABLET TUES, THURS, SAT, AND SUN   B-D ULTRAFINE III SHORT PEN 31G X 8 MM Misc Generic drug: Insulin Pen Needle FOR USE WITH INSULIN ONCE DAILY DX E11.9   clotrimazole-betamethasone cream Commonly known as: LOTRISONE Apply 1 application topically 2 (two) times daily.   ferrous sulfate 325 (65 FE) MG tablet TAKE 1 TABLET BY MOUTH 2 TIMES DAILY WITH A MEAL.   Lantus SoloStar 100 UNIT/ML  Solostar Pen Generic drug: insulin glargine INJECT 22 UNITS INTO THE SKIN DAILY AT 10 PM.   losartan 25 MG tablet Commonly known as: COZAAR Take one tablet by mouth once daily for blood pressure   Lubricant Eye Drops 0.4-0.3 % Soln Generic drug: Polyethyl Glycol-Propyl Glycol Place 1 drop into both eyes 3 (three) times daily as needed (dry/irritated eyes.).   metFORMIN 1000 MG tablet Commonly known as: GLUCOPHAGE TAKE 1 TABLET (1,000 MG TOTAL) BY MOUTH TWICE A DAY WITH FOOD   mirabegron ER 25 MG Tb24 tablet Commonly known as: MYRBETRIQ Take 1 tablet (25 mg total) by mouth daily.   OneTouch Delica Lancets 76H Misc Once daily dx e11.9 (give lancects that go with pts meter)   OneTouch Ultra test strip Generic drug: glucose blood Use as instructed   Vitamin D3 50 MCG (2000 UT) Tabs Take 2,000 Units by mouth daily.        Allergies:  Allergies  Allergen Reactions   Onglyza [Saxagliptin Hydrochloride] Swelling    Tongue swell   Pravastatin Other (See Comments)    Muscle aches   Ace Inhibitors Cough    Family History: Family History  Problem Relation Age of Onset   Alcohol abuse Mother    Esophageal cancer Father    Alcohol abuse Father    Diabetes Sister    Diabetes Sister    Hypertension Sister    Heart attack Sister        in 62's   Seizures Brother     Social History:  reports that she has never smoked. She has never used smokeless tobacco. She reports that she does not drink alcohol and does not use drugs.  ROS: All other review of systems were reviewed and are negative except what is noted above in HPI  Physical Exam: BP (!) 151/77   Pulse (!) 109   Constitutional:  Alert and oriented, No acute distress. HEENT: [place] AT, moist mucus membranes.  Trachea midline, no masses. Cardiovascular: No clubbing, cyanosis, or edema. Respiratory: Normal respiratory effort, no increased work of breathing. GI: Abdomen is soft, nontender, nondistended, no abdominal  masses GU: No CVA tenderness.  Lymph: No cervical or inguinal lymphadenopathy. Skin: No rashes, bruises or suspicious lesions. Neurologic: Grossly intact, no focal deficits, moving all 4 extremities. Psychiatric: Normal mood and affect.  Laboratory Data: Lab Results  Component Value Date   WBC 4.3 08/24/2021   HGB 11.7 08/24/2021   HCT 35.0 08/24/2021   MCV 88 08/24/2021   PLT 324 08/24/2021    Lab Results  Component Value Date   CREATININE 0.63 08/24/2021    No results found for: "PSA"  No results found for: "TESTOSTERONE"  Lab Results  Component Value Date   HGBA1C 7.2 (H) 08/24/2021    Urinalysis    Component Value Date/Time   COLORURINE YELLOW 05/15/2020 1243   APPEARANCEUR Cloudy (A) 02/06/2021 1043  LABSPEC 1.016 05/15/2020 1243   PHURINE 6.0 05/15/2020 1243   GLUCOSEU Negative 02/06/2021 1043   HGBUR LARGE (A) 05/15/2020 1243   BILIRUBINUR negative 10/01/2021 1159   BILIRUBINUR Negative 02/06/2021 1043   KETONESUR negative 10/01/2021 1159   KETONESUR 5 (A) 05/15/2020 1243   PROTEINUR 1+ (A) 02/06/2021 1043   PROTEINUR 100 (A) 05/15/2020 1243   UROBILINOGEN 0.2 10/01/2021 1159   UROBILINOGEN 0.2 01/25/2014 0845   NITRITE Negative 10/01/2021 1159   NITRITE Negative 02/06/2021 1043   NITRITE NEGATIVE 05/15/2020 1243   LEUKOCYTESUR Moderate (2+) (A) 10/01/2021 1159   LEUKOCYTESUR 3+ (A) 02/06/2021 1043   LEUKOCYTESUR LARGE (A) 05/15/2020 1243    Lab Results  Component Value Date   LABMICR 204.2 05/20/2021   WBCUA >30 (A) 02/06/2021   LABEPIT >10 (A) 02/06/2021   MUCUS Present 02/06/2021   BACTERIA Moderate (A) 02/06/2021    Pertinent Imaging: KUB today: Images reviewed and discussed with the patient  Results for orders placed in visit on 02/06/21  Abdomen 1 view (KUB)  Narrative CLINICAL DATA:  History of renal stones and hematuria.  EXAM: ABDOMEN - 1 VIEW  COMPARISON:  December 04, 2020  FINDINGS: The bowel gas pattern is normal. A  large amount of stool is seen throughout the colon. Stable 16 mm and 8 mm soft tissue calcifications are seen projecting over the left renal pelvis and left kidney.  IMPRESSION: 1. Stable left renal calculi. 2. Large stool burden without evidence of bowel obstruction.   Electronically Signed By: Virgina Norfolk M.D. On: 02/18/2021 20:25  No results found for this or any previous visit.  No results found for this or any previous visit.  No results found for this or any previous visit.  Results for orders placed during the hospital encounter of 09/02/20  Ultrasound renal complete  Narrative CLINICAL DATA:  Nephrolithiasis  EXAM: RENAL / URINARY TRACT ULTRASOUND COMPLETE  COMPARISON:  July 05, 2019 February 14, 2020  FINDINGS: Right Kidney:  Renal measurements: 10.5 x 5.0 x 6.2 cm = volume: 168 mL. Echogenicity within normal limits. There is decreased pelviectasis in comparison to prior. No hydronephrosis. Mild prominence of the proximal ureter, similar in comparison prior. Previously described nephrolithiasis within the renal pelvis is no longer visualized within the pelvis. There is a 1.4 cm nephrolithiasis within the inferior pole of the RIGHT kidney.  Left Kidney:  Renal measurements: 11.4 x 7.5 x 5.2 cm = volume: 231 mL. Echogenicity within normal limits. No hydronephrosis. There is a 19 mm nephrolithiasis within the inferior pole of the LEFT kidney.  Bladder:  Appears normal for degree of bladder distention.  Other:  None.  IMPRESSION: 1. Decreased pelviectasis of the RIGHT kidney in comparison to prior. No frank hydronephrosis. 2. There are bilateral nephrolithiasis.   Electronically Signed By: Valentino Saxon MD On: 09/03/2020 10:30  No results found for this or any previous visit.  No results found for this or any previous visit.  No results found for this or any previous visit.   Assessment & Plan:    1. Nephrolithiasis -We  discussed the management of kidney stones. These options include observation, ureteroscopy, shockwave lithotripsy (ESWL) and percutaneous nephrolithotomy (PCNL). We discussed which options are relevant to the patient's stone(s). We discussed the natural history of kidney stones as well as the complications of untreated stones and the impact on quality of life without treatment as well as with each of the above listed treatments. We also discussed the efficacy of  each treatment in its ability to clear the stone burden. With any of these management options I discussed the signs and symptoms of infection and the need for emergent treatment should these be experienced. For each option we discussed the ability of each procedure to clear the patient of their stone burden.   For observation I described the risks which include but are not limited to silent renal damage, life-threatening infection, need for emergent surgery, failure to pass stone and pain.   For ureteroscopy I described the risks which include bleeding, infection, damage to contiguous structures, positioning injury, ureteral stricture, ureteral avulsion, ureteral injury, need for prolonged ureteral stent, inability to perform ureteroscopy, need for an interval procedure, inability to clear stone burden, stent discomfort/pain, heart attack, stroke, pulmonary embolus and the inherent risks with general anesthesia.   For shockwave lithotripsy I described the risks which include arrhythmia, kidney contusion, kidney hemorrhage, need for transfusion, pain, inability to adequately break up stone, inability to pass stone fragments, Steinstrasse, infection associated with obstructing stones, need for alternate surgical procedure, need for repeat shockwave lithotripsy, MI, CVA, PE and the inherent risks with anesthesia/conscious sedation.   For PCNL I described the risks including positioning injury, pneumothorax, hydrothorax, need for chest tube, inability to  clear stone burden, renal laceration, arterial venous fistula or malformation, need for embolization of kidney, loss of kidney or renal function, need for repeat procedure, need for prolonged nephrostomy tube, ureteral avulsion, MI, CVA, PE and the inherent risks of general anesthesia.   - The patient would like to proceed with left ureteroscopic stone extraction - Urinalysis, Routine w reflex microscopic   No follow-ups on file.  [name], MD  [facility] [place]

## 2021-10-06 NOTE — Telephone Encounter (Signed)
Spoke with pt

## 2021-10-07 LAB — URINE CULTURE

## 2021-10-08 ENCOUNTER — Encounter (HOSPITAL_COMMUNITY): Payer: Self-pay

## 2021-10-08 ENCOUNTER — Telehealth: Payer: Self-pay

## 2021-10-08 ENCOUNTER — Emergency Department (HOSPITAL_COMMUNITY)
Admission: EM | Admit: 2021-10-08 | Discharge: 2021-10-08 | Discharge status: Against Medical Advice | Payer: Medicare Other | Attending: Emergency Medicine | Admitting: Emergency Medicine

## 2021-10-08 ENCOUNTER — Other Ambulatory Visit: Payer: Self-pay

## 2021-10-08 DIAGNOSIS — Z7982 Long term (current) use of aspirin: Secondary | ICD-10-CM | POA: Insufficient documentation

## 2021-10-08 DIAGNOSIS — E119 Type 2 diabetes mellitus without complications: Secondary | ICD-10-CM | POA: Insufficient documentation

## 2021-10-08 DIAGNOSIS — Z8673 Personal history of transient ischemic attack (TIA), and cerebral infarction without residual deficits: Secondary | ICD-10-CM | POA: Diagnosis not present

## 2021-10-08 DIAGNOSIS — R339 Retention of urine, unspecified: Secondary | ICD-10-CM | POA: Diagnosis present

## 2021-10-08 DIAGNOSIS — N3001 Acute cystitis with hematuria: Secondary | ICD-10-CM | POA: Diagnosis not present

## 2021-10-08 DIAGNOSIS — I10 Essential (primary) hypertension: Secondary | ICD-10-CM | POA: Diagnosis not present

## 2021-10-08 DIAGNOSIS — Z79899 Other long term (current) drug therapy: Secondary | ICD-10-CM | POA: Diagnosis not present

## 2021-10-08 DIAGNOSIS — Z7984 Long term (current) use of oral hypoglycemic drugs: Secondary | ICD-10-CM | POA: Diagnosis not present

## 2021-10-08 LAB — CBC WITH DIFFERENTIAL/PLATELET
Abs Immature Granulocytes: 0.01 10*3/uL (ref 0.00–0.07)
Basophils Absolute: 0 10*3/uL (ref 0.0–0.1)
Basophils Relative: 1 %
Eosinophils Absolute: 0.1 10*3/uL (ref 0.0–0.5)
Eosinophils Relative: 1 %
HCT: 38.4 % (ref 36.0–46.0)
Hemoglobin: 12.1 g/dL (ref 12.0–15.0)
Immature Granulocytes: 0 %
Lymphocytes Relative: 18 %
Lymphs Abs: 1.2 10*3/uL (ref 0.7–4.0)
MCH: 29.9 pg (ref 26.0–34.0)
MCHC: 31.5 g/dL (ref 30.0–36.0)
MCV: 94.8 fL (ref 80.0–100.0)
Monocytes Absolute: 0.5 10*3/uL (ref 0.1–1.0)
Monocytes Relative: 7 %
Neutro Abs: 5 10*3/uL (ref 1.7–7.7)
Neutrophils Relative %: 73 %
Platelets: 318 10*3/uL (ref 150–400)
RBC: 4.05 MIL/uL (ref 3.87–5.11)
RDW: 12.7 % (ref 11.5–15.5)
WBC: 6.8 10*3/uL (ref 4.0–10.5)
nRBC: 0 % (ref 0.0–0.2)

## 2021-10-08 LAB — URINALYSIS, ROUTINE W REFLEX MICROSCOPIC
Bilirubin Urine: NEGATIVE
Glucose, UA: >=500 mg/dL — AB
Ketones, ur: 5 mg/dL — AB
Nitrite: NEGATIVE
Protein, ur: 100 mg/dL — AB
RBC / HPF: >50 RBC/hpf — ABNORMAL HIGH (ref 0–5)
Specific Gravity, Urine: 1.014 (ref 1.005–1.030)
WBC, UA: >50 WBC/hpf — ABNORMAL HIGH (ref 0–5)
pH: 5 (ref 5.0–8.0)

## 2021-10-08 LAB — COMPREHENSIVE METABOLIC PANEL
ALT: 11 U/L (ref 0–44)
AST: 12 U/L — ABNORMAL LOW (ref 15–41)
Albumin: 4.1 g/dL (ref 3.5–5.0)
Alkaline Phosphatase: 72 U/L (ref 38–126)
Anion gap: 8 (ref 5–15)
BUN: 17 mg/dL (ref 8–23)
CO2: 28 mmol/L (ref 22–32)
Calcium: 9.7 mg/dL (ref 8.9–10.3)
Chloride: 105 mmol/L (ref 98–111)
Creatinine, Ser: 0.61 mg/dL (ref 0.44–1.00)
GFR, Estimated: >60 mL/min (ref >60)
Glucose, Bld: 213 mg/dL — ABNORMAL HIGH (ref 70–99)
Potassium: 3.8 mmol/L (ref 3.5–5.1)
Sodium: 141 mmol/L (ref 135–145)
Total Bilirubin: 1 mg/dL (ref 0.3–1.2)
Total Protein: 7.8 g/dL (ref 6.5–8.1)

## 2021-10-08 LAB — LACTIC ACID, PLASMA: Lactic Acid, Venous: 1.2 mmol/L (ref 0.5–1.9)

## 2021-10-08 MED ORDER — LACTATED RINGERS IV BOLUS: 1000.0000 mL | Freq: Once | INTRAVENOUS | Status: DC

## 2021-10-08 MED ORDER — CEFDINIR 300 MG PO CAPS: 300.0000 mg | ORAL_CAPSULE | Freq: Two times a day (BID) | ORAL | 0 refills | Status: AC

## 2021-10-08 NOTE — ED Provider Triage Note (Cosign Needed Addendum)
Emergency Medicine Provider Triage Evaluation Note  [name] , a 71 y.o. female  was evaluated in triage.  Pt complains of kidney stone.  She reports that she hasn't been able to urinate since 11am.     Physical Exam  BP (!) 171/99 (BP Location: Left Arm)   Pulse (!) 107   Temp 98.9 F (37.2 C) (Oral)   Resp 18   Ht '5\' 3"'$  (1.6 m)   Wt 61.2 kg   SpO2 100%   BMI 23.91 kg/m  Gen:   Awake, Intermittently in pain Resp:  Normal effort  MSK:   Moves extremities without difficulty  Other:  Normal speech  Medical Decision Making  Medically screening exam initiated at 5:49 PM.  Appropriate orders placed.  [name] was informed that the remainder of the evaluation will be completed by another provider, this initial triage assessment does not replace that evaluation, and the importance of remaining in the ED until their evaluation is complete.  Patient and I discussed role of CT scan.  She refused stating she had x-ray yesterday and her urologist was able to show her the stone.  She and I discussed that with a change in symptoms and concern for urinary retention a CT scan provides much more information and she declined scan.     Lorin Glass, PA-C 10/08/21 1750    Lorin Glass, [place] 10/08/21 1756

## 2021-10-08 NOTE — Telephone Encounter (Signed)
Left message to return call to discuss surgery dates Received posting sheet for surgery

## 2021-10-08 NOTE — ED Triage Notes (Signed)
Patient reports that she has a known kidney stone and states that she has a stone in her ureter. Patient states she was voiding small amounts and reports that she has not voided since 1100 this AM.

## 2021-10-08 NOTE — ED Notes (Signed)
Pt retained 247 mL of urine in bladder.

## 2021-10-08 NOTE — Telephone Encounter (Signed)
Patient returned call to office  Patient reports she not not voided since early this and c/o suprapubic pressure as well. Patient with ureteral calculus that needs treatment but due to not being able to void, patient advised to go to [place] with urology call available if patient needs immediate intervention regarding surgery.  Patient voiced understanding.

## 2021-10-08 NOTE — ED Provider Notes (Signed)
[facility] DEPT Provider Note   CSN: [id number] Arrival date & time: 10/08/21  1649     History  Chief Complaint  Patient presents with   Urinary Retention    [name] is a 71 y.o. female.  71 yo F with hx of frequent kidney stones who presents to the emergency department with decreased urination today and several days of dysuria. States that recently she has been having some dysuria and today has not voided since 1130 am so she came into the ED for eval. Says she frequently has flank pain from her stones but none recently. No fevers or n/v. Says that she has been taking in less fluid than usual but can't say exactly why. Says she's on UTI suppressive therapy with macrobid.     Past Medical History:  Diagnosis Date   BACK PAIN, THORACIC REGION, RIGHT 01/27/2010   Qualifier: Diagnosis of  By: Moshe Cipro MD, Jerrel Ivory of foot 07/15/2017   Essential hypertension    Headache(784.0)    Hematuria 02/24/2016   History of kidney stones    Hyperlipidemia    Irregular heart beats    Microscopic hematuria 02/24/2016   Neck pain on right side 01/23/2013   Piles (hemorrhoids) 06/22/2011   Renal calculus, right 05/31/2016   Shoulder pain, right 01/25/2014   SKIN TAG 06/13/2008   Qualifier: Diagnosis of  By: Moshe Cipro MD, Margaret     TIA (transient ischemic attack) 06/19/2016   Type 2 diabetes mellitus ([place])    Vaginitis       Home Medications Prior to Admission medications   Medication Sig Start Date End Date Taking? Authorizing Provider  acetaminophen (TYLENOL) 500 MG tablet Take 1,000 mg by mouth every 6 (six) hours as needed for headache or moderate pain.   Yes [provider]  aspirin EC 81 MG tablet Take 1 tablet (81 mg total) by mouth daily. 11/02/18  Yes Rehman, Mechele Dawley, MD  atorvastatin (LIPITOR) 10 MG tablet TAKE ONE TABLET BY MOUTH EVERY MON, WED, AND FRI, AND HALF A TABLET TUES, THURS, SAT, AND SUN Patient taking  differently: 10 mg daily. 04/29/21  Yes Fayrene Helper, MD  cefdinir (OMNICEF) 300 MG capsule Take 1 capsule (300 mg total) by mouth 2 (two) times daily for 5 days. 10/08/21 10/13/21 Yes Fransico Meadow, MD  Cholecalciferol (VITAMIN D3) 50 MCG (2000 UT) TABS Take 2,000 Units by mouth daily.   Yes [provider]  clotrimazole-betamethasone (LOTRISONE) cream Apply 1 application topically 2 (two) times daily. Patient taking differently: Apply 1 application  topically daily as needed (for rash). 06/17/20  Yes Fayrene Helper, MD  ferrous sulfate 325 (65 FE) MG tablet TAKE 1 TABLET BY MOUTH 2 TIMES DAILY WITH A MEAL. Patient taking differently: Take 325 mg by mouth 2 (two) times daily with a meal. 06/01/21  Yes Fayrene Helper, MD  B-D ULTRAFINE III SHORT PEN 31G X 8 MM MISC FOR USE WITH INSULIN ONCE DAILY DX E11.9 09/01/21   Fayrene Helper, MD  glucose blood (ONETOUCH ULTRA) test strip Use as instructed 04/13/21   Fayrene Helper, MD  LANTUS SOLOSTAR 100 UNIT/ML Solostar Pen INJECT 22 UNITS INTO THE SKIN DAILY AT 10 PM. 09/01/21   Fayrene Helper, MD  losartan (COZAAR) 25 MG tablet Take one tablet by mouth once daily for blood pressure 08/21/21   Fayrene Helper, MD  metFORMIN (GLUCOPHAGE) 1000 MG tablet TAKE 1 TABLET (1,000 MG  TOTAL) BY MOUTH TWICE A DAY WITH FOOD 09/07/21   Fayrene Helper, MD  mirabegron ER (MYRBETRIQ) 25 MG TB24 tablet Take 1 tablet (25 mg total) by mouth daily. 05/20/21   Fayrene Helper, MD  nitrofurantoin, macrocrystal-monohydrate, (MACROBID) 100 MG capsule Take 1 capsule (100 mg total) by mouth every 12 (twelve) hours. 10/05/21   McKenzie, Candee Furbish, MD  OneTouch Delica Lancets 59D MISC Once daily dx e11.9 (give lancects that go with pts meter) 12/06/18   Fayrene Helper, MD  oxyCODONE-acetaminophen (PERCOCET) 5-325 MG tablet Take 1 tablet by mouth every 4 (four) hours as needed. 10/05/21   McKenzie, Candee Furbish, MD  Polyethyl Glycol-Propyl  Glycol (LUBRICANT EYE DROPS) 0.4-0.3 % SOLN Place 1 drop into both eyes 3 (three) times daily as needed (dry/irritated eyes.).    [provider]      Allergies    Onglyza [saxagliptin hydrochloride], Pravastatin, and Ace inhibitors    Review of Systems   Review of Systems  Physical Exam Updated Vital Signs BP (!) 148/88   Pulse 84   Temp 98.2 F (36.8 C) (Oral)   Resp 17   Ht '5\' 3"'$  (1.6 m)   Wt 61.2 kg   SpO2 99%   BMI 23.91 kg/m  Physical Exam Vitals and nursing note reviewed.  Constitutional:      General: She is not in acute distress.    Appearance: She is well-developed.  HENT:     Head: Normocephalic and atraumatic.     Right Ear: External ear normal.     Left Ear: External ear normal.     Nose: Nose normal.     Mouth/Throat:     Mouth: Mucous membranes are dry.     Pharynx: Oropharynx is clear.  Eyes:     Extraocular Movements: Extraocular movements intact.     Conjunctiva/sclera: Conjunctivae normal.     Pupils: Pupils are equal, round, and reactive to light.  Cardiovascular:     Rate and Rhythm: Normal rate and regular rhythm.  Pulmonary:     Effort: Pulmonary effort is normal. No respiratory distress.  Abdominal:     General: There is no distension.     Palpations: Abdomen is soft. There is no mass.     Tenderness: There is no abdominal tenderness. There is no right CVA tenderness, left CVA tenderness or guarding.  Musculoskeletal:        General: No swelling.     Cervical back: Neck supple.  Skin:    General: Skin is warm and dry.     Capillary Refill: Capillary refill takes 2 to 3 seconds.  Neurological:     Mental Status: She is alert and oriented to person, place, and time. Mental status is at baseline.  Psychiatric:        Mood and Affect: Mood normal.     ED Results / Procedures / Treatments   Labs (all labs ordered are listed, but only abnormal results are displayed) Labs Reviewed  URINALYSIS, ROUTINE W REFLEX MICROSCOPIC -  Abnormal; Notable for the following components:      Result Value   APPearance CLOUDY (*)    Glucose, UA >=500 (*)    Hgb urine dipstick LARGE (*)    Ketones, ur 5 (*)    Protein, ur 100 (*)    Leukocytes,Ua LARGE (*)    RBC / HPF >50 (*)    WBC, UA >50 (*)    Bacteria, UA RARE (*)    All  other components within normal limits  COMPREHENSIVE METABOLIC PANEL - Abnormal; Notable for the following components:   Glucose, Bld 213 (*)    AST 12 (*)    All other components within normal limits  CBC WITH DIFFERENTIAL/PLATELET  LACTIC ACID, PLASMA    EKG None  Radiology No results found.  Procedures Procedures    Medications Ordered in ED Medications - No data to display  ED Course/ Medical Decision Making/ A&P                           Medical Decision Making 71 yo F who presents with decreased urination in the setting of decreased PO intake and possible UTI. No sxs of pyelo at this time. No flank pain that would suggest stone that may be obstructive requiring emergent urology consult. Catheterized urine specimen obtained which showed that since the pt's last void at 1130 this morning she only had ~250 ml of urine. She is slightly dry on exam and suspect this is 2/2 decreased fluid intake and possibly from her UTI as well. Urinalysis was sent which appears to be consistent with UTI though may be confounded by her frequent stones but she was started on omnicef to treat her UTI and informed that she will need to fu with urology and her PCP in clinic.   Pt did sign out AMA prior to receiving her papers but I called her and she was able to void spontaneously. I told her I was going to send the omnicef to her pharmacy and discussed fu and return precautions with her.   Amount and/or Complexity of Data Reviewed Labs: ordered.  Risk Prescription drug management.    Final Clinical Impression(s) / ED Diagnoses Final diagnoses:  Acute cystitis with hematuria    Rx / DC Orders ED  Discharge Orders          Ordered    cefdinir (OMNICEF) 300 MG capsule  2 times daily        10/08/21 2334              Fransico Meadow, MD 10/09/21 1116

## 2021-10-11 ENCOUNTER — Encounter: Payer: Self-pay | Admitting: Urology

## 2021-10-11 NOTE — Patient Instructions (Signed)

## 2021-10-13 ENCOUNTER — Telehealth: Payer: Self-pay | Admitting: Family Medicine

## 2021-10-13 NOTE — Telephone Encounter (Signed)
Pt called stating she would like to speak to Dr. Moshe Cipro in regards to the UA possible uti from last week? Still has ongoing symtopms???

## 2021-10-15 ENCOUNTER — Ambulatory Visit (INDEPENDENT_AMBULATORY_CARE_PROVIDER_SITE_OTHER): Payer: Medicare Other | Admitting: Urology

## 2021-10-15 ENCOUNTER — Ambulatory Visit (INDEPENDENT_AMBULATORY_CARE_PROVIDER_SITE_OTHER): Payer: Medicare Other | Admitting: Family Medicine

## 2021-10-15 ENCOUNTER — Encounter: Payer: Self-pay | Admitting: Urology

## 2021-10-15 ENCOUNTER — Encounter: Payer: Self-pay | Admitting: Family Medicine

## 2021-10-15 VITALS — BP 118/75 | HR 83 | Temp 98.8°F | Ht 63.0 in | Wt 131.0 lb

## 2021-10-15 VITALS — BP 143/87 | HR 96

## 2021-10-15 DIAGNOSIS — I10 Essential (primary) hypertension: Secondary | ICD-10-CM | POA: Diagnosis not present

## 2021-10-15 DIAGNOSIS — R339 Retention of urine, unspecified: Secondary | ICD-10-CM

## 2021-10-15 DIAGNOSIS — N3090 Cystitis, unspecified without hematuria: Secondary | ICD-10-CM

## 2021-10-15 DIAGNOSIS — R3915 Urgency of urination: Secondary | ICD-10-CM

## 2021-10-15 DIAGNOSIS — N761 Subacute and chronic vaginitis: Secondary | ICD-10-CM

## 2021-10-15 DIAGNOSIS — N201 Calculus of ureter: Secondary | ICD-10-CM | POA: Insufficient documentation

## 2021-10-15 LAB — MICROSCOPIC EXAMINATION
Renal Epithel, UA: NONE SEEN /hpf
WBC, UA: >30 /hpf — AB (ref 0–5)

## 2021-10-15 LAB — POCT URINALYSIS DIP (CLINITEK)
Bilirubin, UA: NEGATIVE
Glucose, UA: NEGATIVE mg/dL
Ketones, POC UA: NEGATIVE mg/dL
Nitrite, UA: NEGATIVE
POC PROTEIN,UA: 100 — AB
Spec Grav, UA: 1.025 (ref 1.010–1.025)
Urobilinogen, UA: 0.2 E.U./dL
pH, UA: 5.5 (ref 5.0–8.0)

## 2021-10-15 LAB — URINALYSIS, ROUTINE W REFLEX MICROSCOPIC
Bilirubin, UA: NEGATIVE
Glucose, UA: NEGATIVE
Ketones, UA: NEGATIVE
Nitrite, UA: NEGATIVE
Specific Gravity, UA: 1.02 (ref 1.005–1.030)
Urobilinogen, Ur: 0.2 mg/dL (ref 0.2–1.0)
pH, UA: 5.5 (ref 5.0–7.5)

## 2021-10-15 LAB — BLADDER SCAN AMB NON-IMAGING: Scan Result: 93

## 2021-10-15 MED ORDER — TRAMADOL HCL 50 MG PO TABS: 50.0000 mg | ORAL_TABLET | Freq: Four times a day (QID) | ORAL | 0 refills | Status: DC | PRN

## 2021-10-15 NOTE — Patient Instructions (Signed)
F/U as before, call if you need me sooner  Urgent referral to Nephrology, you will get appt info before you leave  Urine to be sent for culture continue antibiotics that you have and drink a lot of water  hSV2, cbc and diff , chem 7 and EGFR today Nurse please check and document temp before she leaves  Thanks for choosing [facility], we consider it a privelige to serve you.

## 2021-10-15 NOTE — Progress Notes (Signed)
post void residual=93

## 2021-10-15 NOTE — Progress Notes (Signed)
Subjective:  1. Left ureteral stone   2. Urgency of urination   3. Incomplete bladder emptying      [name] presents with urgency and a sensation of incomplete emptying. She was seen in the [facility] ER on 10/08/21 and was felt to be dehydrated.  Her UA had >50 WBC and RBC but a culture wasn't done.  She had a culture on 7/31 that had Mx species. She was given macrodantin then.  Her KUB on 7/31 shows that the left proximal ureteral stone is now at the left UVJ and is about 2cm in length.   She has pelvic pain as well.  Her UA has WBC and RBC's.  Her PVR is only 72m.      ROS:  ROS:  A complete review of systems was performed.  All systems are negative except for pertinent findings as noted.   ROS  Allergies  Allergen Reactions   Onglyza [Saxagliptin Hydrochloride] Swelling    Tongue swell   Pravastatin Other (See Comments)    Muscle aches   Ace Inhibitors Cough    Outpatient Encounter Medications as of 10/15/2021  Medication Sig Note   traMADol (ULTRAM) 50 MG tablet Take 1 tablet (50 mg total) by mouth every 6 (six) hours as needed for moderate pain.    acetaminophen (TYLENOL) 500 MG tablet Take 1,000 mg by mouth every 6 (six) hours as needed for headache or moderate pain.    aspirin EC 81 MG tablet Take 1 tablet (81 mg total) by mouth daily.    atorvastatin (LIPITOR) 10 MG tablet TAKE ONE TABLET BY MOUTH EVERY MON, WED, AND FRI, AND HALF A TABLET TUES, THURS, SAT, AND SUN (Patient taking differently: 10 mg daily.)    B-D ULTRAFINE III SHORT PEN 31G X 8 MM MISC FOR USE WITH INSULIN ONCE DAILY DX E11.9    Cholecalciferol (VITAMIN D3) 50 MCG (2000 UT) TABS Take 2,000 Units by mouth daily.    clotrimazole-betamethasone (LOTRISONE) cream Apply 1 application topically 2 (two) times daily. (Patient taking differently: Apply 1 application  topically daily as needed (for rash).) 10/01/2021: As needed   ferrous sulfate 325 (65 FE) MG tablet TAKE 1 TABLET BY MOUTH 2 TIMES DAILY WITH A MEAL.  (Patient taking differently: Take 325 mg by mouth 2 (two) times daily with a meal.)    glucose blood (ONETOUCH ULTRA) test strip Use as instructed    LANTUS SOLOSTAR 100 UNIT/ML Solostar Pen INJECT 22 UNITS INTO THE SKIN DAILY AT 10 PM.    losartan (COZAAR) 25 MG tablet Take one tablet by mouth once daily for blood pressure    metFORMIN (GLUCOPHAGE) 1000 MG tablet TAKE 1 TABLET (1,000 MG TOTAL) BY MOUTH TWICE A DAY WITH FOOD    mirabegron ER (MYRBETRIQ) 25 MG TB24 tablet Take 1 tablet (25 mg total) by mouth daily.    nitrofurantoin, macrocrystal-monohydrate, (MACROBID) 100 MG capsule Take 1 capsule (100 mg total) by mouth every 12 (twelve) hours. (Patient not taking: Reported on 83/55/7322    OneTouch Delica Lancets 302RMISC Once daily dx e11.9 (give lancects that go with pts meter)    oxyCODONE-acetaminophen (PERCOCET) 5-325 MG tablet Take 1 tablet by mouth every 4 (four) hours as needed. (Patient not taking: Reported on 10/15/2021)    Polyethyl Glycol-Propyl Glycol (LUBRICANT EYE DROPS) 0.4-0.3 % SOLN Place 1 drop into both eyes 3 (three) times daily as needed (dry/irritated eyes.).    No facility-administered encounter medications on file as of 10/15/2021.  Past Medical History:  Diagnosis Date   BACK PAIN, THORACIC REGION, RIGHT 01/27/2010   Qualifier: Diagnosis of  By: Moshe Cipro MD, Jerrel Ivory of foot 07/15/2017   Essential hypertension    Headache(784.0)    Hematuria 02/24/2016   History of kidney stones    Hyperlipidemia    Irregular heart beats    Microscopic hematuria 02/24/2016   Neck pain on right side 01/23/2013   Piles (hemorrhoids) 06/22/2011   Renal calculus, right 05/31/2016   Shoulder pain, right 01/25/2014   SKIN TAG 06/13/2008   Qualifier: Diagnosis of  By: Moshe Cipro MD, Margaret     TIA (transient ischemic attack) 06/19/2016   Type 2 diabetes mellitus ([place])    Vaginitis     Past Surgical History:  Procedure Laterality Date   COLONOSCOPY N/A 10/26/2018    Procedure: COLONOSCOPY;  Surgeon: Rogene Houston, MD;  Location: [facility];  Service: Endoscopy;  Laterality: N/A;  830-10:30am   cyst removed Right 1998   cyst- removed from right wrist   CYSTOSCOPY WITH RETROGRADE PYELOGRAM, URETEROSCOPY AND STENT PLACEMENT Right 05/19/2020   Procedure: CYSTOSCOPY WITH RETROGRADE PYELOGRAM, URETEROSCOPY AND STENT PLACEMENT;  Surgeon: Cleon Gustin, MD;  Location: [facility];  Service: Urology;  Laterality: Right;   CYSTOSCOPY WITH RETROGRADE PYELOGRAM, URETEROSCOPY AND STENT PLACEMENT Right 06/26/2020   Procedure: CYSTOSCOPY WITH RETROGRADE PYELOGRAM, URETEROSCOPY AND STENT EXCHANGE;  Surgeon: Cleon Gustin, MD;  Location: [facility];  Service: Urology;  Laterality: Right;   EXTRACORPOREAL SHOCK WAVE LITHOTRIPSY Right 05/29/2018   Procedure: EXTRACORPOREAL SHOCK WAVE LITHOTRIPSY (ESWL);  Surgeon: Kathie Rhodes, MD;  Location: [facility];  Service: Urology;  Laterality: Right;   HOLMIUM LASER APPLICATION Right 6/56/8127   Procedure: HOLMIUM LASER APPLICATION;  Surgeon: Cleon Gustin, MD;  Location: [facility];  Service: Urology;  Laterality: Right;   lithotrpsy     over 20 years   NEPHROLITHOTOMY Right 05/31/2016   Procedure: NEPHROLITHOTOMY PERCUTANEOUS WITH SURGEON ACCESS;  Surgeon: Cleon Gustin, MD;  Location: [facility];  Service: Urology;  Laterality: Right;   POLYPECTOMY  10/26/2018   Procedure: POLYPECTOMY;  Surgeon: Rogene Houston, MD;  Location: [facility];  Service: Endoscopy;;  colon   TOTAL ABDOMINAL HYSTERECTOMY W/ BILATERAL SALPINGOOPHORECTOMY  1998   TUBAL LIGATION      Social History   Socioeconomic History   Marital status: Single    Spouse name: Not on file   Number of children: 1   Years of education: Not on file   Highest education level: 12th grade  Occupational History   Occupation: employed   Tobacco Use   Smoking status: Never   Smokeless tobacco: Never  Vaping Use   Vaping Use: Never used  Substance and Sexual  Activity   Alcohol use: No   Drug use: No   Sexual activity: Not Currently  Other Topics Concern   Not on file  Social History Narrative   Not on file   Social Determinants of Health   Financial Resource Strain: Low Risk  (02/04/2021)   Overall Financial Resource Strain (CARDIA)    Difficulty of Paying Living Expenses: Not hard at all  Food Insecurity: No Food Insecurity (02/04/2021)   Hunger Vital Sign    Worried About Running Out of Food in the Last Year: Never true    Ran Out of Food in the Last Year: Never true  Transportation Needs: No Transportation Needs (02/04/2021)   PRAPARE - Transportation  Lack of Transportation (Medical): No    Lack of Transportation (Non-Medical): No  Physical Activity: Inactive (02/04/2021)   Exercise Vital Sign    Days of Exercise per Week: 0 days    Minutes of Exercise per Session: 0 min  Stress: No Stress Concern Present (02/04/2021)   [place]    Feeling of Stress : Only a little  Social Connections: Moderately Isolated (02/04/2021)   Social Connection and Isolation Panel [NHANES]    Frequency of Communication with Friends and Family: Twice a week    Frequency of Social Gatherings with Friends and Family: Three times a week    Attends Religious Services: 1 to 4 times per year    Active Member of Clubs or Organizations: No    Attends Archivist Meetings: Never    Marital Status: Separated  Intimate Partner Violence: Not At Risk (02/04/2021)   Humiliation, Afraid, Rape, and Kick questionnaire    Fear of Current or Ex-Partner: No    Emotionally Abused: No    Physically Abused: No    Sexually Abused: No    Family History  Problem Relation Age of Onset   Alcohol abuse Mother    Esophageal cancer Father    Alcohol abuse Father    Diabetes Sister    Diabetes Sister    Hypertension Sister    Heart attack Sister        in 84's   Seizures Brother         Objective: Vitals:   10/15/21 1153  BP: (!) 143/87  Pulse: 96     Physical Exam Vitals reviewed.  Constitutional:      Appearance: Normal appearance.     Comments: In some discomfort.   Neurological:     Mental Status: She is alert.     Lab Results:  PSA No results found for: "PSA" No results found for: "TESTOSTERONE" No results found.    Studies/Results: No results found. Results for orders placed during the hospital encounter of 10/05/21  Abdomen 1 view (KUB)  Narrative CLINICAL DATA:  Left flank pain, history of stones  EXAM: ABDOMEN - 1 VIEW  COMPARISON:  Radiographs 02/17/2021  FINDINGS: The bowel gas pattern is normal. Decreased stool burden in the right colon. The previous large soft tissue calcifications projecting over the left renal pelvis are no longer visualized. Small residual soft tissue calcifications measuring 9 mm in total length.  IMPRESSION: Decreased burden of renal calculi in the left kidney.   Electronically Signed By: Placido Sou M.D. On: 10/05/2021 23:55  On my reviewed there is a 2cm calcification that had been in the proximal ureter and is now at the [place].   No results found for this or any previous visit.  No results found for this or any previous visit.  No results found for this or any previous visit.  Results for orders placed during the hospital encounter of 09/02/20  Ultrasound renal complete  Narrative CLINICAL DATA:  Nephrolithiasis  EXAM: RENAL / URINARY TRACT ULTRASOUND COMPLETE  COMPARISON:  July 05, 2019 February 14, 2020  FINDINGS: Right Kidney:  Renal measurements: 10.5 x 5.0 x 6.2 cm = volume: 168 mL. Echogenicity within normal limits. There is decreased pelviectasis in comparison to prior. No hydronephrosis. Mild prominence of the proximal ureter, similar in comparison prior. Previously described nephrolithiasis within the renal pelvis is no longer visualized within the pelvis.  There is a 1.4 cm nephrolithiasis within the inferior  pole of the RIGHT kidney.  Left Kidney:  Renal measurements: 11.4 x 7.5 x 5.2 cm = volume: 231 mL. Echogenicity within normal limits. No hydronephrosis. There is a 19 mm nephrolithiasis within the inferior pole of the LEFT kidney.  Bladder:  Appears normal for degree of bladder distention.  Other:  None.  IMPRESSION: 1. Decreased pelviectasis of the RIGHT kidney in comparison to prior. No frank hydronephrosis. 2. There are bilateral nephrolithiasis.   Electronically Signed By: Valentino Saxon MD On: 09/03/2020 10:30  No results found for this or any previous visit.  No results found for this or any previous visit.  No results found for this or any previous visit.    Assessment & Plan: 2cm LUVJ stone with pain and urgency.   She has Myrbetriq to use.  I will send tramadol as she doesn't like the oxycodone.  We will try to get her on for ureteroscopy ASAP with Dr. Alyson Ingles.     Meds ordered this encounter  Medications   traMADol (ULTRAM) 50 MG tablet    Sig: Take 1 tablet (50 mg total) by mouth every 6 (six) hours as needed for moderate pain.    Dispense:  15 tablet    Refill:  0     Orders Placed This Encounter  Procedures   Urine Culture   Urinalysis, Routine w reflex microscopic   BLADDER SCAN AMB NON-IMAGING      Return for Schedule ureteroscopy with Dr. Alyson Ingles ASAP. Marland Kitchen   CC: Fayrene Helper, MD      Irine Seal 10/15/2021

## 2021-10-15 NOTE — Assessment & Plan Note (Addendum)
Difficulty voiding x 2 weeks, pain  and blood in urine inc x 2 days

## 2021-10-15 NOTE — Assessment & Plan Note (Signed)
Needs urgent management by Urology, will get office visit appt today if possible, patient in pain with retention

## 2021-10-15 NOTE — Telephone Encounter (Signed)
Patient has an appt 08.10.2023 8:40

## 2021-10-15 NOTE — Assessment & Plan Note (Signed)
Controlled, no change in medication DASH diet and commitment to daily physical activity for a minimum of 30 minutes discussed and encouraged, as a part of hypertension management. The importance of attaining a healthy weight is also discussed.     10/15/2021    9:04 AM 10/08/2021   10:15 PM 10/08/2021    9:34 PM 10/08/2021    5:37 PM 10/08/2021    4:56 PM 10/05/2021    2:52 PM 10/01/2021   11:32 AM  BP/Weight  Systolic BP 865 784 696  295 284 132  Diastolic BP 75 88 440  99 77 84  Wt. (Lbs) 131   135     BMI 23.21 kg/m2   23.91 kg/m2

## 2021-10-15 NOTE — Assessment & Plan Note (Signed)
reporrts burning and stinging, check for HSV2

## 2021-10-15 NOTE — Progress Notes (Signed)
   [name]     MRN: [id number]      DOB: 11-17-1950   HPI [name] is here with ongoing lower pelvic pain , blood in urine, which is new, pain and ambulation is worse today than yesterday Left flank pain, poor voiding Has been to 2 Providers, urology and PCP as well as ED since July 27 with the problem Needs urgent urology eval, attempting to get urine c/s sent today again Has been treated with nitrofurantopuin and c, then on cefdinir  ROS Denies recent fever or chills. Denies sinus pressure, nasal congestion, ear pain or sore throat. Denies chest congestion, productive cough or wheezing. Denies chest pains, palpitations and leg swelling Denies joint pain, swelling and limitation in mobility. Denies headaches, seizures, numbness, or tingling.  Denies skin break down or rash.   PE  BP 118/75 (BP Location: Right Arm, Patient Position: Sitting, Cuff Size: Normal)   Pulse 83   Ht '5\' 3"'$  (1.6 m)   Wt 131 lb (59.4 kg)   SpO2 96%   BMI 23.21 kg/m   Patient alert and oriented and in no cardiopulmonary distress.Pt in pain  HEENT: No facial asymmetry, EOMI,     Neck supple .  Chest: Clear to auscultation bilaterally.  CVS: S1, S2 no murmurs, no S3.Regular rate.  ABD: Soft , left flank tenderness and lower abdominal and pelvic tenderness possible rebound  Ext: No edema  MS: Adequate ROM spine, shoulders, hips and knees.  Skin: Intact, no ulcerations or rash noted.  Psych: Good eye contact, normal affect. Memory intact not anxious or depressed appearing.  CNS: CN 2-12 intact, power,  normal throughout.no focal deficits noted.   Assessment & Plan  Urinary retention Difficulty voiding x 2 weeks, pain  and blood in urine inc x 2 days  Essential hypertension Controlled, no change in medication DASH diet and commitment to daily physical activity for a minimum of 30 minutes discussed and encouraged, as a part of hypertension management. The importance of attaining a  healthy weight is also discussed.     10/15/2021    9:04 AM 10/08/2021   10:15 PM 10/08/2021    9:34 PM 10/08/2021    5:37 PM 10/08/2021    4:56 PM 10/05/2021    2:52 PM 10/01/2021   11:32 AM  BP/Weight  Systolic BP 144 315 400  867 619 509  Diastolic BP 75 88 326  99 77 84  Wt. (Lbs) 131   135     BMI 23.21 kg/m2   23.91 kg/m2          Left ureteral stone Needs urgent management by Urology, will get office visit appt today if possible, patient in pain with retention  Chronic vaginitis reporrts burning and stinging, check for HSV2

## 2021-10-15 NOTE — Progress Notes (Signed)
I spoke with [name]. We have discussed possible surgery dates and 10/19/2021 was agreed upon by all parties. Patient given information about surgery date, what to expect pre-operatively and post operatively.    We discussed that a pre-op nurse will be calling to set up the pre-op visit that will take place prior to surgery. Informed patient that our office will communicate any additional care to be provided after surgery.    Patients questions or concerns were discussed during our call. Advised to call our office should there be any additional information, questions or concerns that arise. Patient verbalized understanding.

## 2021-10-16 ENCOUNTER — Encounter (HOSPITAL_COMMUNITY)
Admission: RE | Admit: 2021-10-16 | Discharge: 2021-10-16 | Disposition: A | Discharge status: Home / Self Care | Payer: Medicare Other | Source: Ambulatory Visit | Attending: Urology | Admitting: Urology

## 2021-10-16 ENCOUNTER — Telehealth: Payer: Self-pay

## 2021-10-16 DIAGNOSIS — N201 Calculus of ureter: Secondary | ICD-10-CM

## 2021-10-16 NOTE — Pre-Procedure Instructions (Signed)
Attempted pre-op phone call. Left VM on [phone] for her to call us back.

## 2021-10-16 NOTE — Telephone Encounter (Signed)
Patient came by office today with stone for analysis.  Patient scheduled for surgery Monday, surgery will be cancelled after Dr. Felipa Eth reviewed patient CT and confirmed no other present stones.  Cancelled surgery with Hoyle Sauer at [facility] OR

## 2021-10-17 LAB — URINE CULTURE
Organism ID, Bacteria: NO GROWTH
Organism ID, Bacteria: NO GROWTH

## 2021-10-19 ENCOUNTER — Encounter (HOSPITAL_COMMUNITY): Admission: RE | Payer: Self-pay | Source: Home / Self Care

## 2021-10-19 ENCOUNTER — Ambulatory Visit (HOSPITAL_COMMUNITY): Admission: RE | Admit: 2021-10-19 | Payer: Medicare Other | Source: Home / Self Care | Admitting: Urology

## 2021-10-19 SURGERY — CYSTOURETEROSCOPY, WITH RETROGRADE PYELOGRAM AND STENT INSERTION
Anesthesia: General | Laterality: Left

## 2021-10-20 LAB — BMP8+EGFR
BUN/Creatinine Ratio: 19 (ref 12–28)
BUN: 14 mg/dL (ref 8–27)
CO2: 25 mmol/L (ref 20–29)
Calcium: 10 mg/dL (ref 8.7–10.3)
Chloride: 103 mmol/L (ref 96–106)
Creatinine, Ser: 0.72 mg/dL (ref 0.57–1.00)
Glucose: 88 mg/dL (ref 70–99)
Potassium: 4.5 mmol/L (ref 3.5–5.2)
Sodium: 145 mmol/L — ABNORMAL HIGH (ref 134–144)
eGFR: 89 mL/min/{1.73_m2} (ref >59)

## 2021-10-20 LAB — CBC WITH DIFFERENTIAL/PLATELET
Basophils Absolute: 0.1 10*3/uL (ref 0.0–0.2)
Basos: 1 %
EOS (ABSOLUTE): 0.2 10*3/uL (ref 0.0–0.4)
Eos: 3 %
Hematocrit: 37.9 % (ref 34.0–46.6)
Hemoglobin: 12.3 g/dL (ref 11.1–15.9)
Immature Grans (Abs): 0 10*3/uL (ref 0.0–0.1)
Immature Granulocytes: 0 %
Lymphocytes Absolute: 1.2 10*3/uL (ref 0.7–3.1)
Lymphs: 27 %
MCH: 28.6 pg (ref 26.6–33.0)
MCHC: 32.5 g/dL (ref 31.5–35.7)
MCV: 88 fL (ref 79–97)
Monocytes Absolute: 0.3 10*3/uL (ref 0.1–0.9)
Monocytes: 7 %
Neutrophils Absolute: 2.8 10*3/uL (ref 1.4–7.0)
Neutrophils: 62 %
Platelets: 460 10*3/uL — ABNORMAL HIGH (ref 150–450)
RBC: 4.3 x10E6/uL (ref 3.77–5.28)
RDW: 11.2 % — ABNORMAL LOW (ref 11.7–15.4)
WBC: 4.5 10*3/uL (ref 3.4–10.8)

## 2021-10-20 LAB — HSV-2 IGG SUPPLEMENTAL TEST

## 2021-10-20 LAB — HSV 2 ANTIBODY, IGG: HSV 2 IgG, Type Spec: 1.08 index — ABNORMAL HIGH (ref 0.00–0.90)

## 2021-10-23 LAB — CALCULI, WITH PHOTOGRAPH (CLINICAL LAB)
Calcium Oxalate Dihydrate: 50 %
Calcium Oxalate Monohydrate: 50 %
Weight Calculi: 691 mg

## 2021-10-24 LAB — SPECIMEN STATUS REPORT

## 2021-10-24 LAB — HSV-2 AB, IGG: HSV 2 IgG, Type Spec: 1.04 index — ABNORMAL HIGH (ref 0.00–0.90)

## 2021-10-28 ENCOUNTER — Ambulatory Visit: Payer: Medicare Other | Admitting: Urology

## 2021-11-03 ENCOUNTER — Telehealth: Payer: Self-pay | Admitting: Family Medicine

## 2021-11-03 NOTE — Telephone Encounter (Signed)
Appt scheduled 10/17 patient is aware

## 2021-11-03 NOTE — Telephone Encounter (Signed)
Pls contact and sched appt for f/u in early to mid October, thanks

## 2021-12-22 ENCOUNTER — Encounter: Payer: Self-pay | Admitting: Family Medicine

## 2021-12-22 ENCOUNTER — Ambulatory Visit (INDEPENDENT_AMBULATORY_CARE_PROVIDER_SITE_OTHER): Payer: Medicare Other | Admitting: Family Medicine

## 2021-12-22 VITALS — BP 135/82 | HR 90 | Ht 63.0 in | Wt 134.1 lb

## 2021-12-22 DIAGNOSIS — N3090 Cystitis, unspecified without hematuria: Secondary | ICD-10-CM

## 2021-12-22 DIAGNOSIS — Z23 Encounter for immunization: Secondary | ICD-10-CM

## 2021-12-22 DIAGNOSIS — Z794 Long term (current) use of insulin: Secondary | ICD-10-CM

## 2021-12-22 DIAGNOSIS — E782 Mixed hyperlipidemia: Secondary | ICD-10-CM | POA: Diagnosis not present

## 2021-12-22 DIAGNOSIS — E1169 Type 2 diabetes mellitus with other specified complication: Secondary | ICD-10-CM | POA: Diagnosis not present

## 2021-12-22 DIAGNOSIS — I10 Essential (primary) hypertension: Secondary | ICD-10-CM

## 2021-12-22 LAB — POCT URINALYSIS DIP (CLINITEK)
Bilirubin, UA: NEGATIVE
Glucose, UA: NEGATIVE mg/dL
Ketones, POC UA: NEGATIVE mg/dL
Nitrite, UA: NEGATIVE
POC PROTEIN,UA: 100 — AB
Spec Grav, UA: 1.025 (ref 1.010–1.025)
Urobilinogen, UA: 0.2 E.U./dL
pH, UA: 6 (ref 5.0–8.0)

## 2021-12-22 NOTE — Patient Instructions (Signed)
Follow-up in 4 months call if you need me sooner.  Urine  tested today suggests  infection, I will not prescribe antibiotics until C/S is back.   flu vaccine in office today.  Please plan to get the COVID-vaccine next week.  Please plan to get your 2 shingles vaccines over the next 4 months.  Labs today lipids CMP and EGFR and HbA1c.  Urine to be sent for microalbuminuria as well as culture and sensitivity.  Please reschedule mammogram at checkout.  Nurse please print letter from GI and gave to the patient.  She needs to fit complete a form for her colonoscopy this past due.

## 2021-12-22 NOTE — Progress Notes (Unsigned)
[name]     MRN: [id number]      DOB: 1950-11-24   HPI [name] is here for follow up and re-evaluation of chronic medical conditions, medication management and review of any available recent lab and radiology data.  Preventive health is updated, specifically  Cancer screening and Immunization.   Questions or concerns regarding consultations or procedures which the PT has had in the interim are  addressed. The PT denies any adverse reactions to current medications since the last visit.  Dysuria x 5 days, denies, no fever, chills or flank pain Denies polyuria, polydipsia, blurred vision , or hypoglycemic episodes.  ROS Denies recent fever or chills. Denies sinus pressure, nasal congestion, ear pain or sore throat. Denies chest congestion, productive cough or wheezing. Denies chest pains, palpitations and leg swelling Denies abdominal pain, nausea, vomiting,diarrhea or constipation.    Denies joint pain, swelling and limitation in mobility. Denies headaches, seizures, numbness, or tingling. Denies depression, anxiety or insomnia. Denies skin break down or rash.   PE  BP 135/82 (BP Location: Right Arm, Patient Position: Sitting, Cuff Size: Normal)   Pulse 90   Ht '5\' 3"'$  (1.6 m)   Wt 134 lb 1.3 oz (60.8 kg)   SpO2 97%   BMI 23.75 kg/m   Patient alert and oriented and in no cardiopulmonary distress.  HEENT: No facial asymmetry, EOMI,     Neck supple .  Chest: Clear to auscultation bilaterally.  CVS: S1, S2 no murmurs, no S3.Regular rate.  ABD: Soft non tender.   Ext: No edema  MS: Adequate ROM spine, shoulders, hips and knees.  Skin: Intact, no ulcerations or rash noted.  Psych: Good eye contact, normal affect. Memory intact not anxious or depressed appearing.  CNS: CN 2-12 intact, power,  normal throughout.no focal deficits noted.   Assessment & Plan  Cystitis Abnormal CCUA, mildly symptomatic , send for c/s no abx yet, push fluids  Essential  hypertension DASH diet and commitment to daily physical activity for a minimum of 30 minutes discussed and encouraged, as a part of hypertension management. The importance of attaining a healthy weight is also discussed.     12/22/2021    9:43 AM 10/15/2021   11:53 AM 10/15/2021    9:04 AM 10/08/2021   10:15 PM 10/08/2021    9:34 PM 10/08/2021    5:37 PM 10/08/2021    4:56 PM  BP/Weight  Systolic BP 071 219 758 832 549  826  Diastolic BP 82 87 75 88 415  99  Wt. (Lbs) 134.08  131   135   BMI 23.75 kg/m2  23.21 kg/m2   23.91 kg/m2        Type 2 diabetes mellitus with other specified complication [facility]) [name] is reminded of the importance of commitment to daily physical activity for 30 minutes or more, as able and the need to limit carbohydrate intake to 30 to 60 grams per meal to help with blood sugar control.   The need to take medication as prescribed, test blood sugar as directed, and to call between visits if there is a concern that blood sugar is uncontrolled is also discussed.   [name] is reminded of the importance of daily foot exam, annual eye examination, and good blood sugar, blood pressure and cholesterol control. Less well controlled monitor diet     Latest Ref Rng & Units 12/22/2021   10:37 AM 10/15/2021   10:29 AM 10/08/2021    6:25 PM 08/24/2021  11:11 AM 05/20/2021    9:53 AM  Diabetic Labs  HbA1c 4.8 - 5.6 % 7.7    7.2  7.5   Chol 100 - 199 mg/dL 182     176   HDL >39 mg/dL 85     86   Calc LDL 0 - 99 mg/dL 86     81   Triglycerides 0 - 149 mg/dL 58     43   Creatinine 0.57 - 1.00 mg/dL 0.67  0.72  0.61  0.63  0.75       12/22/2021    9:43 AM 10/15/2021   11:53 AM 10/15/2021    9:04 AM 10/08/2021   10:15 PM 10/08/2021    9:34 PM 10/08/2021    5:37 PM 10/08/2021    4:56 PM  BP/Weight  Systolic BP 579 038 333 832 919  166  Diastolic BP 82 87 75 88 060  99  Wt. (Lbs) 134.08  131   135   BMI 23.75 kg/m2  23.21 kg/m2   23.91 kg/m2       Latest Ref Rng & Units  08/21/2021    8:40 AM 03/05/2021   12:00 AM  Foot/eye exam completion dates  Eye Exam No Retinopathy  No Retinopathy      Foot Form Completion  Done      This result is from an external source.        Hyperlipidemia Hyperlipidemia:Low fat diet discussed and encouraged.   Lipid Panel  Lab Results  Component Value Date   CHOL 182 12/22/2021   HDL 85 12/22/2021   LDLCALC 86 12/22/2021   TRIG 58 12/22/2021   CHOLHDL 2.1 12/22/2021     Controlled, no change in medication

## 2021-12-23 ENCOUNTER — Encounter: Payer: Self-pay | Admitting: Family Medicine

## 2021-12-23 LAB — HEMOGLOBIN A1C
Est. average glucose Bld gHb Est-mCnc: 174 mg/dL
Hgb A1c MFr Bld: 7.7 % — ABNORMAL HIGH (ref 4.8–5.6)

## 2021-12-23 LAB — CMP14+EGFR
ALT: 10 IU/L (ref 0–32)
AST: 13 IU/L (ref 0–40)
Albumin/Globulin Ratio: 1.8 (ref 1.2–2.2)
Albumin: 4.6 g/dL (ref 3.8–4.8)
Alkaline Phosphatase: 91 IU/L (ref 44–121)
BUN/Creatinine Ratio: 24 (ref 12–28)
BUN: 16 mg/dL (ref 8–27)
Bilirubin Total: 0.4 mg/dL (ref 0.0–1.2)
CO2: 23 mmol/L (ref 20–29)
Calcium: 9.7 mg/dL (ref 8.7–10.3)
Chloride: 100 mmol/L (ref 96–106)
Creatinine, Ser: 0.67 mg/dL (ref 0.57–1.00)
Globulin, Total: 2.6 g/dL (ref 1.5–4.5)
Glucose: 68 mg/dL — ABNORMAL LOW (ref 70–99)
Potassium: 4.3 mmol/L (ref 3.5–5.2)
Sodium: 140 mmol/L (ref 134–144)
Total Protein: 7.2 g/dL (ref 6.0–8.5)
eGFR: 93 mL/min/{1.73_m2} (ref >59)

## 2021-12-23 LAB — LIPID PANEL
Chol/HDL Ratio: 2.1 ratio (ref 0.0–4.4)
Cholesterol, Total: 182 mg/dL (ref 100–199)
HDL: 85 mg/dL (ref >39)
LDL Chol Calc (NIH): 86 mg/dL (ref 0–99)
Triglycerides: 58 mg/dL (ref 0–149)
VLDL Cholesterol Cal: 11 mg/dL (ref 5–40)

## 2021-12-23 NOTE — Assessment & Plan Note (Signed)
Hyperlipidemia:Low fat diet discussed and encouraged.   Lipid Panel  Lab Results  Component Value Date   CHOL 182 12/22/2021   HDL 85 12/22/2021   LDLCALC 86 12/22/2021   TRIG 58 12/22/2021   CHOLHDL 2.1 12/22/2021     Controlled, no change in medication  

## 2021-12-23 NOTE — Assessment & Plan Note (Signed)
Abnormal CCUA, mildly symptomatic , send for c/s no abx yet, push fluids

## 2021-12-23 NOTE — Assessment & Plan Note (Signed)
Ms. Spurlock is reminded of the importance of commitment to daily physical activity for 30 minutes or more, as able and the need to limit carbohydrate intake to 30 to 60 grams per meal to help with blood sugar control.   The need to take medication as prescribed, test blood sugar as directed, and to call between visits if there is a concern that blood sugar is uncontrolled is also discussed.   Ms. Lacross is reminded of the importance of daily foot exam, annual eye examination, and good blood sugar, blood pressure and cholesterol control. Less well controlled monitor diet     Latest Ref Rng & Units 12/22/2021   10:37 AM 10/15/2021   10:29 AM 10/08/2021    6:25 PM 08/24/2021   11:11 AM 05/20/2021    9:53 AM  Diabetic Labs  HbA1c 4.8 - 5.6 % 7.7    7.2  7.5   Chol 100 - 199 mg/dL 182     176   HDL >39 mg/dL 85     86   Calc LDL 0 - 99 mg/dL 86     81   Triglycerides 0 - 149 mg/dL 58     43   Creatinine 0.57 - 1.00 mg/dL 0.67  0.72  0.61  0.63  0.75       12/22/2021    9:43 AM 10/15/2021   11:53 AM 10/15/2021    9:04 AM 10/08/2021   10:15 PM 10/08/2021    9:34 PM 10/08/2021    5:37 PM 10/08/2021    4:56 PM  BP/Weight  Systolic BP 323 557 322 025 427  062  Diastolic BP 82 87 75 88 376  99  Wt. (Lbs) 134.08  131   135   BMI 23.75 kg/m2  23.21 kg/m2   23.91 kg/m2       Latest Ref Rng & Units 08/21/2021    8:40 AM 03/05/2021   12:00 AM  Foot/eye exam completion dates  Eye Exam No Retinopathy  No Retinopathy      Foot Form Completion  Done      This result is from an external source.

## 2021-12-23 NOTE — Assessment & Plan Note (Signed)
DASH diet and commitment to daily physical activity for a minimum of 30 minutes discussed and encouraged, as a part of hypertension management. The importance of attaining a healthy weight is also discussed.     12/22/2021    9:43 AM 10/15/2021   11:53 AM 10/15/2021    9:04 AM 10/08/2021   10:15 PM 10/08/2021    9:34 PM 10/08/2021    5:37 PM 10/08/2021    4:56 PM  BP/Weight  Systolic BP 943 200 379 444 619  012  Diastolic BP 82 87 75 88 224  99  Wt. (Lbs) 134.08  131   135   BMI 23.75 kg/m2  23.21 kg/m2   23.91 kg/m2

## 2021-12-24 LAB — URINE CULTURE

## 2021-12-24 LAB — MICROALBUMIN / CREATININE URINE RATIO
Creatinine, Urine: 77 mg/dL
Microalb/Creat Ratio: 386 mg/g creat — ABNORMAL HIGH (ref 0–29)
Microalbumin, Urine: 297 ug/mL

## 2021-12-25 ENCOUNTER — Other Ambulatory Visit: Payer: Self-pay

## 2021-12-25 DIAGNOSIS — E1169 Type 2 diabetes mellitus with other specified complication: Secondary | ICD-10-CM

## 2022-01-06 ENCOUNTER — Ambulatory Visit (HOSPITAL_COMMUNITY)
Admission: RE | Admit: 2022-01-06 | Discharge: 2022-01-06 | Disposition: A | Discharge status: Home / Self Care | Payer: Medicare Other | Source: Ambulatory Visit | Attending: Family Medicine | Admitting: Family Medicine

## 2022-01-06 DIAGNOSIS — Z1231 Encounter for screening mammogram for malignant neoplasm of breast: Secondary | ICD-10-CM | POA: Insufficient documentation

## 2022-01-14 ENCOUNTER — Other Ambulatory Visit: Payer: Self-pay | Admitting: Family Medicine

## 2022-01-18 ENCOUNTER — Other Ambulatory Visit: Payer: Self-pay

## 2022-01-18 ENCOUNTER — Ambulatory Visit: Payer: Medicare Other | Admitting: Urology

## 2022-01-18 DIAGNOSIS — N201 Calculus of ureter: Secondary | ICD-10-CM

## 2022-01-20 ENCOUNTER — Ambulatory Visit: Payer: Medicare Other | Admitting: Urology

## 2022-01-20 ENCOUNTER — Encounter: Payer: Self-pay | Admitting: Urology

## 2022-01-20 ENCOUNTER — Ambulatory Visit (HOSPITAL_COMMUNITY)
Admission: RE | Admit: 2022-01-20 | Discharge: 2022-01-20 | Disposition: A | Discharge status: Home / Self Care | Payer: Medicare Other | Source: Ambulatory Visit | Attending: Urology | Admitting: Urology

## 2022-01-20 VITALS — BP 142/83 | HR 116

## 2022-01-20 DIAGNOSIS — N200 Calculus of kidney: Secondary | ICD-10-CM

## 2022-01-20 DIAGNOSIS — N201 Calculus of ureter: Secondary | ICD-10-CM | POA: Insufficient documentation

## 2022-01-20 NOTE — Patient Instructions (Signed)

## 2022-01-20 NOTE — Progress Notes (Signed)
01/20/2022 12:10 PM   [name] 1950-12-15 [id number]  Referring provider: Fayrene Helper, MD [address], [place],  [place] [address]  Followup nephrolithiasis   HPI: Ms Blackard is a 71yo here for followup for nephrolithiasis. No stoen events since she passed her 1cm calculus. She denies any flank pain. No significant LUTS. No UTI since last visit. KUB shows 5-31m left lower pole calculus. No other complaints today.    PMH: Past Medical History:  Diagnosis Date   BACK PAIN, THORACIC REGION, RIGHT 01/27/2010   Qualifier: Diagnosis of  By: SMoshe CiproMD, MJerrel Ivoryof foot 07/15/2017   Essential hypertension    Headache(784.0)    Hematuria 02/24/2016   History of kidney stones    Hyperlipidemia    Irregular heart beats    Microscopic hematuria 02/24/2016   Neck pain on right side 01/23/2013   Piles (hemorrhoids) 06/22/2011   Renal calculus, right 05/31/2016   Shoulder pain, right 01/25/2014   SKIN TAG 06/13/2008   Qualifier: Diagnosis of  By: SMoshe CiproMD, Margaret     TIA (transient ischemic attack) 06/19/2016   Type 2 diabetes mellitus [place]    Vaginitis     Surgical History: Past Surgical History:  Procedure Laterality Date   COLONOSCOPY N/A 10/26/2018   Procedure: COLONOSCOPY;  Surgeon: RRogene Houston MD;  [facility];  Service: Endoscopy;  Laterality: N/A;  830-10:30am   cyst removed Right 1998   cyst- removed from right wrist   CYSTOSCOPY WITH RETROGRADE PYELOGRAM, URETEROSCOPY AND STENT PLACEMENT Right 05/19/2020   Procedure: CYSTOSCOPY WITH RETROGRADE PYELOGRAM, URETEROSCOPY AND STENT PLACEMENT;  Surgeon: MCleon Gustin MD;  Location: [facility];  Service: Urology;  Laterality: Right;   CYSTOSCOPY WITH RETROGRADE PYELOGRAM, URETEROSCOPY AND STENT PLACEMENT Right 06/26/2020   Procedure: CYSTOSCOPY WITH RETROGRADE PYELOGRAM, URETEROSCOPY AND STENT EXCHANGE;  Surgeon: MCleon Gustin MD;  Location: [facility];  Service:  Urology;  Laterality: Right;   EXTRACORPOREAL SHOCK WAVE LITHOTRIPSY Right 05/29/2018   Procedure: EXTRACORPOREAL SHOCK WAVE LITHOTRIPSY (ESWL);  Surgeon: OKathie Rhodes MD;  Location: [facility];  Service: Urology;  Laterality: Right;   HOLMIUM LASER APPLICATION Right 40/11/3816  Procedure: HOLMIUM LASER APPLICATION;  Surgeon: MCleon Gustin MD;  Location: [facility];  Service: Urology;  Laterality: Right;   lithotrpsy     over 20 years   NEPHROLITHOTOMY Right 05/31/2016   Procedure: NEPHROLITHOTOMY PERCUTANEOUS WITH SURGEON ACCESS;  Surgeon: PCleon Gustin MD;  Location: [facility];  Service: Urology;  Laterality: Right;   POLYPECTOMY  10/26/2018   Procedure: POLYPECTOMY;  Surgeon: RRogene Houston MD;  [facility];  Service: Endoscopy;;  colon   TOTAL ABDOMINAL HYSTERECTOMY W/ BILATERAL SALPINGOOPHORECTOMY  1998   TUBAL LIGATION      Home Medications:  Allergies as of 01/20/2022       Reactions   Onglyza [saxagliptin Hydrochloride] Swelling   Tongue swell   Pravastatin Other (See Comments)   Muscle aches   Ace Inhibitors Cough        Medication List        Accurate as of January 20, 2022 12:10 PM. If you have any questions, ask your nurse or doctor.          acetaminophen 500 MG tablet Commonly known as: TYLENOL Take 1,000 mg by mouth every 6 (six) hours as needed for headache or moderate pain.   aspirin EC 81 MG tablet Take 1 tablet (81  mg total) by mouth daily.   atorvastatin 10 MG tablet Commonly known as: LIPITOR TAKE ONE TABLET BY MOUTH EVERY MON, WED, AND FRI, AND HALF A TABLET TUES, THURS, SAT, AND SUN   B-D ULTRAFINE III SHORT PEN 31G X 8 MM Misc Generic drug: Insulin Pen Needle FOR USE WITH INSULIN ONCE DAILY DX E11.9   clotrimazole-betamethasone cream Commonly known as: LOTRISONE Apply 1 application topically 2 (two) times daily. What changed:  when to take this reasons to take this   ferrous sulfate 325 (65 FE) MG tablet TAKE 1  TABLET BY MOUTH 2 TIMES DAILY WITH A MEAL.   Lantus SoloStar 100 UNIT/ML Solostar Pen Generic drug: insulin glargine INJECT 22 UNITS INTO THE SKIN DAILY AT 10 PM.   losartan 25 MG tablet Commonly known as: COZAAR Take one tablet by mouth once daily for blood pressure   Lubricant Eye Drops 0.4-0.3 % Soln Generic drug: Polyethyl Glycol-Propyl Glycol Place 1 drop into both eyes 3 (three) times daily as needed (dry/irritated eyes.).   metFORMIN 1000 MG tablet Commonly known as: GLUCOPHAGE TAKE 1 TABLET (1,000 MG TOTAL) BY MOUTH TWICE A DAY WITH FOOD   mirabegron ER 25 MG Tb24 tablet Commonly known as: MYRBETRIQ Take 1 tablet (25 mg total) by mouth daily.   OneTouch Delica Lancets 60F Misc Once daily dx e11.9 (give lancects that go with pts meter)   OneTouch Ultra test strip Generic drug: glucose blood Use as instructed   traMADol 50 MG tablet Commonly known as: Ultram Take 1 tablet (50 mg total) by mouth every 6 (six) hours as needed for moderate pain.   Vitamin D3 50 MCG (2000 UT) Tabs Take 2,000 Units by mouth daily.        Allergies:  Allergies  Allergen Reactions   Onglyza [Saxagliptin Hydrochloride] Swelling    Tongue swell   Pravastatin Other (See Comments)    Muscle aches   Ace Inhibitors Cough    Family History: Family History  Problem Relation Age of Onset   Alcohol abuse Mother    Esophageal cancer Father    Alcohol abuse Father    Diabetes Sister    Diabetes Sister    Hypertension Sister    Heart attack Sister        in 45's   Seizures Brother     Social History:  reports that she has never smoked. She has never used smokeless tobacco. She reports that she does not drink alcohol and does not use drugs.  ROS: All other review of systems were reviewed and are negative except what is noted above in HPI  Physical Exam: BP (!) 142/83   Pulse (!) 116   Constitutional:  Alert and oriented, No acute distress. HEENT: [place] AT, moist mucus membranes.   Trachea midline, no masses. Cardiovascular: No clubbing, cyanosis, or edema. Respiratory: Normal respiratory effort, no increased work of breathing. GI: Abdomen is soft, nontender, nondistended, no abdominal masses GU: No CVA tenderness.  Lymph: No cervical or inguinal lymphadenopathy. Skin: No rashes, bruises or suspicious lesions. Neurologic: Grossly intact, no focal deficits, moving all 4 extremities. Psychiatric: Normal mood and affect.  Laboratory Data: Lab Results  Component Value Date   WBC 4.5 10/15/2021   HGB 12.3 10/15/2021   HCT 37.9 10/15/2021   MCV 88 10/15/2021   PLT 460 (H) 10/15/2021    Lab Results  Component Value Date   CREATININE 0.67 12/22/2021    No results found for: "PSA"  No results found for: "  TESTOSTERONE"  Lab Results  Component Value Date   HGBA1C 7.7 (H) 12/22/2021    Urinalysis    Component Value Date/Time   COLORURINE YELLOW 10/08/2021 2136   APPEARANCEUR Hazy (A) 10/15/2021 1154   LABSPEC 1.014 10/08/2021 2136   PHURINE 5.0 10/08/2021 2136   GLUCOSEU Negative 10/15/2021 1154   HGBUR LARGE (A) 10/08/2021 2136   BILIRUBINUR negative 12/22/2021 1004   BILIRUBINUR Negative 10/15/2021 1154   KETONESUR negative 12/22/2021 1004   KETONESUR 5 (A) 10/08/2021 2136   PROTEINUR 2+ (A) 10/15/2021 1154   PROTEINUR 100 (A) 10/08/2021 2136   UROBILINOGEN 0.2 12/22/2021 1004   UROBILINOGEN 0.2 01/25/2014 0845   NITRITE Negative 12/22/2021 1004   NITRITE Negative 10/15/2021 1154   NITRITE NEGATIVE 10/08/2021 2136   LEUKOCYTESUR Moderate (2+) (A) 12/22/2021 1004   LEUKOCYTESUR 1+ (A) 10/15/2021 1154   LEUKOCYTESUR LARGE (A) 10/08/2021 2136    Lab Results  Component Value Date   LABMICR 297.0 12/22/2021   WBCUA >30 (A) 10/15/2021   LABEPIT 0-10 10/15/2021   MUCUS Present 10/15/2021   BACTERIA Few 10/15/2021    Pertinent Imaging: KUB today: Images reviewed and discussed with the patient.  Results for orders placed during the hospital  encounter of 10/05/21  Abdomen 1 view (KUB)  Narrative CLINICAL DATA:  Left flank pain, history of stones  EXAM: ABDOMEN - 1 VIEW  COMPARISON:  Radiographs 02/17/2021  FINDINGS: The bowel gas pattern is normal. Decreased stool burden in the right colon. The previous large soft tissue calcifications projecting over the left renal pelvis are no longer visualized. Small residual soft tissue calcifications measuring 9 mm in total length.  IMPRESSION: Decreased burden of renal calculi in the left kidney.   Electronically Signed By: Placido Sou M.D. On: 10/05/2021 23:55  No results found for this or any previous visit.  No results found for this or any previous visit.  No results found for this or any previous visit.  Results for orders placed during the hospital encounter of 09/02/20  Ultrasound renal complete  Narrative CLINICAL DATA:  Nephrolithiasis  EXAM: RENAL / URINARY TRACT ULTRASOUND COMPLETE  COMPARISON:  July 05, 2019 February 14, 2020  FINDINGS: Right Kidney:  Renal measurements: 10.5 x 5.0 x 6.2 cm = volume: 168 mL. Echogenicity within normal limits. There is decreased pelviectasis in comparison to prior. No hydronephrosis. Mild prominence of the proximal ureter, similar in comparison prior. Previously described nephrolithiasis within the renal pelvis is no longer visualized within the pelvis. There is a 1.4 cm nephrolithiasis within the inferior pole of the RIGHT kidney.  Left Kidney:  Renal measurements: 11.4 x 7.5 x 5.2 cm = volume: 231 mL. Echogenicity within normal limits. No hydronephrosis. There is a 19 mm nephrolithiasis within the inferior pole of the LEFT kidney.  Bladder:  Appears normal for degree of bladder distention.  Other:  None.  IMPRESSION: 1. Decreased pelviectasis of the RIGHT kidney in comparison to prior. No frank hydronephrosis. 2. There are bilateral nephrolithiasis.   Electronically Signed By: Valentino Saxon MD On: 09/03/2020 10:30  No valid procedures specified. No results found for this or any previous visit.  No results found for this or any previous visit.   Assessment & Plan:    1. Nephrolithiasis -RTC 3 months with KUB - Abdomen 1 view (KUB); Future   Return in about 3 months (around 04/22/2022) for KUB.  [name], MD  [facility] [place]

## 2022-01-21 LAB — URINALYSIS, ROUTINE W REFLEX MICROSCOPIC
Bilirubin, UA: NEGATIVE
Glucose, UA: NEGATIVE
Ketones, UA: NEGATIVE
Nitrite, UA: NEGATIVE
Specific Gravity, UA: 1.02 (ref 1.005–1.030)
Urobilinogen, Ur: 0.2 mg/dL (ref 0.2–1.0)
pH, UA: 5.5 (ref 5.0–7.5)

## 2022-01-21 LAB — MICROSCOPIC EXAMINATION
RBC, Urine: >30 /hpf — AB (ref 0–2)
WBC, UA: >30 /hpf — AB (ref 0–5)

## 2022-02-07 ENCOUNTER — Other Ambulatory Visit: Payer: Self-pay | Admitting: Family Medicine

## 2022-02-16 ENCOUNTER — Ambulatory Visit (INDEPENDENT_AMBULATORY_CARE_PROVIDER_SITE_OTHER): Payer: Medicare Other

## 2022-02-16 DIAGNOSIS — Z0000 Encounter for general adult medical examination without abnormal findings: Secondary | ICD-10-CM

## 2022-02-16 NOTE — Patient Instructions (Signed)
[name] , Thank you for taking time to come for your Medicare Wellness Visit. I appreciate your ongoing commitment to your health goals. Please review the following plan we discussed and let me know if I can assist you in the future.   Screening recommendations/referrals: Colonoscopy: Completed Mammogram: Completed Bone Density: Completed Recommended yearly ophthalmology/optometry visit for glaucoma screening and checkup Recommended yearly dental visit for hygiene and checkup  Vaccinations: Influenza vaccine: Completed Pneumococcal vaccine: Completed Tdap vaccine: Completed Shingles vaccine: Get at your pharmacy    Advanced directives: patient declined  Conditions/risks identified: falls, hypertension, diabetes  Next appointment: 1 year   Preventive Care 72 Years and Older, Female Preventive care refers to lifestyle choices and visits with your health care provider that can promote health and wellness. What does preventive care include? A yearly physical exam. This is also called an annual well check. Dental exams once or twice a year. Routine eye exams. Ask your health care provider how often you should have your eyes checked. Personal lifestyle choices, including: Daily care of your teeth and gums. Regular physical activity. Eating a healthy diet. Avoiding tobacco and drug use. Limiting alcohol use. Practicing safe sex. Taking low-dose aspirin every day. Taking vitamin and mineral supplements as recommended by your health care provider. What happens during an annual well check? The services and screenings done by your health care provider during your annual well check will depend on your age, overall health, lifestyle risk factors, and family history of disease. Counseling  Your health care provider may ask you questions about your: Alcohol use. Tobacco use. Drug use. Emotional well-being. Home and relationship well-being. Sexual activity. Eating habits. History  of falls. Memory and ability to understand (cognition). Work and work Statistician. Reproductive health. Screening  You may have the following tests or measurements: Height, weight, and BMI. Blood pressure. Lipid and cholesterol levels. These may be checked every 5 years, or more frequently if you are over 55 years old. Skin check. Lung cancer screening. You may have this screening every year starting at age 85 if you have a 30-pack-year history of smoking and currently smoke or have quit within the past 15 years. Fecal occult blood test (FOBT) of the stool. You may have this test every year starting at age 43. Flexible sigmoidoscopy or colonoscopy. You may have a sigmoidoscopy every 5 years or a colonoscopy every 10 years starting at age 60. Hepatitis C blood test. Hepatitis B blood test. Sexually transmitted disease (STD) testing. Diabetes screening. This is done by checking your blood sugar (glucose) after you have not eaten for a while (fasting). You may have this done every 1-3 years. Bone density scan. This is done to screen for osteoporosis. You may have this done starting at age 64. Mammogram. This may be done every 1-2 years. Talk to your health care provider about how often you should have regular mammograms. Talk with your health care provider about your test results, treatment options, and if necessary, the need for more tests. Vaccines  Your health care provider may recommend certain vaccines, such as: Influenza vaccine. This is recommended every year. Tetanus, diphtheria, and acellular pertussis (Tdap, Td) vaccine. You may need a Td booster every 10 years. Zoster vaccine. You may need this after age 95. Pneumococcal 13-valent conjugate (PCV13) vaccine. One dose is recommended after age 29. Pneumococcal polysaccharide (PPSV23) vaccine. One dose is recommended after age 78. Talk to your health care provider about which screenings and vaccines you need and how often  you need  them. This information is not intended to replace advice given to you by your health care provider. Make sure you discuss any questions you have with your health care provider. Document Released: 03/21/2015 Document Revised: 11/12/2015 Document Reviewed: 12/24/2014 Elsevier Interactive Patient Education  2017 [place] Prevention in the Home Falls can cause injuries. They can happen to people of all ages. There are many things you can do to make your home safe and to help prevent falls. What can I do on the outside of my home? Regularly fix the edges of walkways and driveways and fix any cracks. Remove anything that might make you trip as you walk through a door, such as a raised step or threshold. Trim any bushes or trees on the path to your home. Use bright outdoor lighting. Clear any walking paths of anything that might make someone trip, such as rocks or tools. Regularly check to see if handrails are loose or broken. Make sure that both sides of any steps have handrails. Any raised decks and porches should have guardrails on the edges. Have any leaves, snow, or ice cleared regularly. Use sand or salt on walking paths during winter. Clean up any spills in your garage right away. This includes oil or grease spills. What can I do in the bathroom? Use night lights. Install grab bars by the toilet and in the tub and shower. Do not use towel bars as grab bars. Use non-skid mats or decals in the tub or shower. If you need to sit down in the shower, use a plastic, non-slip stool. Keep the floor dry. Clean up any water that spills on the floor as soon as it happens. Remove soap buildup in the tub or shower regularly. Attach bath mats securely with double-sided non-slip rug tape. Do not have throw rugs and other things on the floor that can make you trip. What can I do in the bedroom? Use night lights. Make sure that you have a light by your bed that is easy to reach. Do not use  any sheets or blankets that are too big for your bed. They should not hang down onto the floor. Have a firm chair that has side arms. You can use this for support while you get dressed. Do not have throw rugs and other things on the floor that can make you trip. What can I do in the kitchen? Clean up any spills right away. Avoid walking on wet floors. Keep items that you use a lot in easy-to-reach places. If you need to reach something above you, use a strong step stool that has a grab bar. Keep electrical cords out of the way. Do not use floor polish or wax that makes floors slippery. If you must use wax, use non-skid floor wax. Do not have throw rugs and other things on the floor that can make you trip. What can I do with my stairs? Do not leave any items on the stairs. Make sure that there are handrails on both sides of the stairs and use them. Fix handrails that are broken or loose. Make sure that handrails are as long as the stairways. Check any carpeting to make sure that it is firmly attached to the stairs. Fix any carpet that is loose or worn. Avoid having throw rugs at the top or bottom of the stairs. If you do have throw rugs, attach them to the floor with carpet tape. Make sure that you have a light  switch at the top of the stairs and the bottom of the stairs. If you do not have them, ask someone to add them for you. What else can I do to help prevent falls? Wear shoes that: Do not have high heels. Have rubber bottoms. Are comfortable and fit you well. Are closed at the toe. Do not wear sandals. If you use a stepladder: Make sure that it is fully opened. Do not climb a closed stepladder. Make sure that both sides of the stepladder are locked into place. Ask someone to hold it for you, if possible. Clearly mark and make sure that you can see: Any grab bars or handrails. First and last steps. Where the edge of each step is. Use tools that help you move around (mobility aids)  if they are needed. These include: Canes. Walkers. Scooters. Crutches. Turn on the lights when you go into a dark area. Replace any light bulbs as soon as they burn out. Set up your furniture so you have a clear path. Avoid moving your furniture around. If any of your floors are uneven, fix them. If there are any pets around you, be aware of where they are. Review your medicines with your doctor. Some medicines can make you feel dizzy. This can increase your chance of falling. Ask your doctor what other things that you can do to help prevent falls. This information is not intended to replace advice given to you by your health care provider. Make sure you discuss any questions you have with your health care provider. Document Released: 12/19/2008 Document Revised: 07/31/2015 Document Reviewed: 03/29/2014 Elsevier Interactive Patient Education  2017 [employer].

## 2022-02-16 NOTE — Progress Notes (Signed)
Subjective:   [name] is a 71 y.o. female who presents for Medicare Annual (Subsequent) preventive examination.  I connected with  [name] on 02/16/22 by a audio enabled telemedicine application and verified that I am speaking with the correct person using two identifiers.  Patient Location: Home  Provider Location: Office/Clinic  I discussed the limitations of evaluation and management by telemedicine. The patient expressed understanding and agreed to proceed.   Review of Systems     [name] , Thank you for taking time to come for your Medicare Wellness Visit. I appreciate your ongoing commitment to your health goals. Please review the following plan we discussed and let me know if I can assist you in the future.   These are the goals we discussed:  Goals      DIET - INCREASE WATER INTAKE     Eat more fruits and vegetables     Exercise 3x per week (30 min per time)     Glycemic Management Optimized     Evidence-based guidance:  Anticipate A1C testing (point-of-care) every 3 to 6 months based on goal attainment.  Review mutually-set A1C goal or target range.  Anticipate screening for thyroid dysfunction, adrenal dysfunction and celiac disease based on presenting signs/symptoms.  Anticipate use of insulin, multidose or continuous subcutaneous infusion with periodic adjustments; consider active involvement of pharmacist.  Explore child and caregiver fear of hypoglycemic episodes that may impact adherence to treatment plan, such as withholding insulin and allowing blood sugars to be ?oa little? high.  Provide medical nutrition therapy and development of individualized eating plan.  Compare child or caregiver reported symptoms of hypo or hyperglycemia to blood glucose levels, diet and fluid intake, current medications, psychosocial and physiologic stressors, change in activity and barriers to care adherence.  Promote self-monitoring of blood glucose levels,  interval or continuous.  Assess and address barriers regarding adherence to treatment and self-management plan, including patient factors, such as family cohesion, age, developmental ability, depression, anxiety, fear of hypoglycemia or weight gain, as   well as medication cost, side effects and complicated regimen.  Encourage developmentally-appropriate balance between dependence and independence in care, especially when intensifying treatment, such as continuous blood glucose monitoring or the use of an insulin pump.  Consider referral to community-based diabetes education program, visiting nurse, community health worker or health coach.  Initiate or review diabetes medical management plan for school that may include storage of insulin and clean, private location for insulin administration or blood glucose monitoring.   Notes: pt wants to regulate sugars.      Increase physical activity     Walking for 30 mins for 3 days a week      Patient Stated     More active         This is a list of the screening recommended for you and due dates:  Health Maintenance  Topic Date Due   Zoster (Shingles) Vaccine (1 of 2) Never done   Colon Cancer Screening  10/25/2021   COVID-19 Vaccine (5 - 2023-24 season) 11/06/2021   Eye exam for diabetics  03/05/2022   DTaP/Tdap/Td vaccine (2 - Td or Tdap) 04/24/2022   Hemoglobin A1C  06/23/2022   Complete foot exam   08/22/2022   Yearly kidney function blood test for diabetes  12/23/2022   Yearly kidney health urinalysis for diabetes  12/23/2022   Medicare Annual Wellness Visit  02/17/2023   Mammogram  01/07/2024   Pneumonia Vaccine  Completed  Flu Shot  Completed   DEXA scan (bone density measurement)  Completed   Hepatitis C Screening: USPSTF Recommendation to screen - Ages 73-79 yo.  Completed   HPV Vaccine  Aged Out        Objective:    There were no vitals filed for this visit. There is no height or weight on file to calculate BMI.      10/08/2021    5:39 PM 02/04/2021    2:40 PM 06/24/2020   10:27 AM 05/19/2020    6:36 AM 05/15/2020   12:44 PM 12/31/2019    1:26 PM 12/28/2019    6:42 PM  Advanced Directives  Does Patient Have a Medical Advance Directive? _0  No No  Would patient like information on creating a medical advance directive? No - Patient declined No - Patient declined No - Patient declined  No - Patient declined No - Patient declined     Current Medications (verified) Outpatient Encounter Medications as of 02/16/2022  Medication Sig   acetaminophen (TYLENOL) 500 MG tablet Take 1,000 mg by mouth every 6 (six) hours as needed for headache or moderate pain.   aspirin EC 81 MG tablet Take 1 tablet (81 mg total) by mouth daily.   atorvastatin (LIPITOR) 10 MG tablet TAKE ONE TABLET BY MOUTH EVERY MON, WED, AND FRI, AND HALF A TABLET TUES, THURS, SAT, AND SUN   B-D ULTRAFINE III SHORT PEN 31G X 8 MM MISC FOR USE WITH INSULIN ONCE DAILY DX E11.9   Cholecalciferol (VITAMIN D3) 50 MCG (2000 UT) TABS Take 2,000 Units by mouth daily.   clotrimazole-betamethasone (LOTRISONE) cream Apply 1 application topically 2 (two) times daily. (Patient taking differently: Apply 1 application  topically daily as needed (for rash).)   ferrous sulfate 325 (65 FE) MG tablet TAKE 1 TABLET BY MOUTH 2 TIMES DAILY WITH A MEAL. (Patient taking differently: Take 325 mg by mouth 2 (two) times daily with a meal.)   glucose blood (ONETOUCH ULTRA) test strip Use as instructed   LANTUS SOLOSTAR 100 UNIT/ML Solostar Pen INJECT 22 UNITS INTO THE SKIN DAILY AT 10 PM.   losartan (COZAAR) 25 MG tablet TAKE HALF TABLET ONCE DAILY BY MOUTH   metFORMIN (GLUCOPHAGE) 1000 MG tablet TAKE 1 TABLET (1,000 MG TOTAL) BY MOUTH TWICE A DAY WITH FOOD   mirabegron ER (MYRBETRIQ) 25 MG TB24 tablet Take 1 tablet (25 mg total) by mouth daily.   OneTouch Delica Lancets 95V MISC Once daily dx e11.9 (give lancects that go with pts meter)   Polyethyl Glycol-Propyl  Glycol (LUBRICANT EYE DROPS) 0.4-0.3 % SOLN Place 1 drop into both eyes 3 (three) times daily as needed (dry/irritated eyes.).   traMADol (ULTRAM) 50 MG tablet Take 1 tablet (50 mg total) by mouth every 6 (six) hours as needed for moderate pain.   No facility-administered encounter medications on file as of 02/16/2022.    Allergies (verified) Onglyza [saxagliptin hydrochloride], Pravastatin, and Ace inhibitors   History: Past Medical History:  Diagnosis Date   BACK PAIN, THORACIC REGION, RIGHT 01/27/2010   Qualifier: Diagnosis of  By: Moshe Cipro MD, Jerrel Ivory of foot 07/15/2017   Essential hypertension    Headache(784.0)    Hematuria 02/24/2016   History of kidney stones    Hyperlipidemia    Irregular heart beats    Microscopic hematuria 02/24/2016   Neck pain on right side 01/23/2013   Piles (hemorrhoids) 06/22/2011   Renal calculus, right 05/31/2016  Shoulder pain, right 01/25/2014   SKIN TAG 06/13/2008   Qualifier: Diagnosis of  By: Moshe Cipro MD, Joycelyn Schmid     TIA (transient ischemic attack) 06/19/2016   Type 2 diabetes mellitus ([place])    Vaginitis    Past Surgical History:  Procedure Laterality Date   COLONOSCOPY N/A 10/26/2018   Procedure: COLONOSCOPY;  Surgeon: Rogene Houston, MD;  Location: [facility];  Service: Endoscopy;  Laterality: N/A;  830-10:30am   cyst removed Right 1998   cyst- removed from right wrist   CYSTOSCOPY WITH RETROGRADE PYELOGRAM, URETEROSCOPY AND STENT PLACEMENT Right 05/19/2020   Procedure: CYSTOSCOPY WITH RETROGRADE PYELOGRAM, URETEROSCOPY AND STENT PLACEMENT;  Surgeon: Cleon Gustin, MD;  Location: [facility];  Service: Urology;  Laterality: Right;   CYSTOSCOPY WITH RETROGRADE PYELOGRAM, URETEROSCOPY AND STENT PLACEMENT Right 06/26/2020   Procedure: CYSTOSCOPY WITH RETROGRADE PYELOGRAM, URETEROSCOPY AND STENT EXCHANGE;  Surgeon: Cleon Gustin, MD;  Location: [facility];  Service: Urology;  Laterality: Right;   EXTRACORPOREAL SHOCK WAVE  LITHOTRIPSY Right 05/29/2018   Procedure: EXTRACORPOREAL SHOCK WAVE LITHOTRIPSY (ESWL);  Surgeon: Kathie Rhodes, MD;  Location: [facility];  Service: Urology;  Laterality: Right;   HOLMIUM LASER APPLICATION Right 2/83/1517   Procedure: HOLMIUM LASER APPLICATION;  Surgeon: Cleon Gustin, MD;  Location: [facility];  Service: Urology;  Laterality: Right;   lithotrpsy     over 20 years   NEPHROLITHOTOMY Right 05/31/2016   Procedure: NEPHROLITHOTOMY PERCUTANEOUS WITH SURGEON ACCESS;  Surgeon: Cleon Gustin, MD;  Location: [facility];  Service: Urology;  Laterality: Right;   POLYPECTOMY  10/26/2018   Procedure: POLYPECTOMY;  Surgeon: Rogene Houston, MD;  [facility];  Service: Endoscopy;;  colon   TOTAL ABDOMINAL HYSTERECTOMY W/ BILATERAL SALPINGOOPHORECTOMY  1998   TUBAL LIGATION     Family History  Problem Relation Age of Onset   Alcohol abuse Mother    Esophageal cancer Father    Alcohol abuse Father    Diabetes Sister    Diabetes Sister    Hypertension Sister    Heart attack Sister        in 65's   Seizures Brother    Social History   Socioeconomic History   Marital status: Single    Spouse name: Not on file   Number of children: 1   Years of education: Not on file   Highest education level: 12th grade  Occupational History   Occupation: employed   Tobacco Use   Smoking status: Never   Smokeless tobacco: Never  Vaping Use   Vaping Use: Never used  Substance and Sexual Activity   Alcohol use: No   Drug use: No   Sexual activity: Not Currently  Other Topics Concern   Not on file  Social History Narrative   Not on file   Social Determinants of Health   Financial Resource Strain: Low Risk  (02/04/2021)   Overall Financial Resource Strain (CARDIA)    Difficulty of Paying Living Expenses: Not hard at all  Food Insecurity: No Food Insecurity (02/04/2021)   Hunger Vital Sign    Worried About Running Out of Food in the Last Year: Never true    Ran Out of Food  in the Last Year: Never true  Transportation Needs: No Transportation Needs (02/04/2021)   PRAPARE - Hydrologist (Medical): No    Lack of Transportation (Non-Medical): No  Physical Activity: Inactive (02/04/2021)   Exercise Vital Sign    Days of  Exercise per Week: 0 days    Minutes of Exercise per Session: 0 min  Stress: No Stress Concern Present (02/04/2021)   [place]    Feeling of Stress : Only a little  Social Connections: Moderately Isolated (02/04/2021)   Social Connection and Isolation Panel [NHANES]    Frequency of Communication with Friends and Family: Twice a week    Frequency of Social Gatherings with Friends and Family: Three times a week    Attends Religious Services: 1 to 4 times per year    Active Member of Clubs or Organizations: No    Attends Archivist Meetings: Never    Marital Status: Separated    Tobacco Counseling Counseling given: Not Answered   Clinical Intake:                 Diabetic? Yes Nutrition Risk Assessment:  Has the patient had any N/V/D within the last 2 months?  No  Does the patient have any non-healing wounds?  No  Has the patient had any unintentional weight loss or weight gain?  No   Diabetes:  Is the patient diabetic?  Yes  If diabetic, was a CBG obtained today?  No  Did the patient bring in their glucometer from home?  No  How often do you monitor your CBG's? Once a day.   Financial Strains and Diabetes Management:  Are you having any financial strains with the device, your supplies or your medication? No .  Does the patient want to be seen by Chronic Care Management for management of their diabetes?  No  Would the patient like to be referred to a Nutritionist or for Diabetic Management?  No   Diabetic Exams:  Diabetic Eye Exam: Completed 03/05/2021. Overdue for diabetic eye exam. Pt has been advised about the  importance in completing this exam. A referral has been placed today. Message sent to referral coordinator for scheduling purposes. Advised pt to expect a call from office referred to regarding appt.  Diabetic Foot Exam: Completed 08/21/2021.        Activities of Daily Living     No data to display           Patient Care Team: Fayrene Helper, MD as PCP - General Williams Che, MD (Inactive) (Ophthalmology)  Indicate any recent Medical Services you may have received from other than [facility] providers in the past year (date may be approximate).     Assessment:   This is a routine wellness examination for [name].  Hearing/Vision screen No results found.  Dietary issues and exercise activities discussed:     Goals Addressed   None   Depression Screen    12/22/2021    9:44 AM 10/15/2021    9:06 AM 10/01/2021   11:32 AM 06/25/2021    2:22 PM 02/04/2021    2:40 PM 02/04/2021    2:37 PM 02/03/2021    8:57 AM  PHQ 2/9 Scores  PHQ - 2 Score 0 1 0 0 0 0 0    Fall Risk    12/22/2021    9:44 AM 10/15/2021    9:05 AM 10/01/2021   11:32 AM 06/25/2021    2:22 PM 05/20/2021    8:47 AM  Fall Risk   Falls in the past year? 0 0 0 0 0  Number falls in past yr: 0 0 0 0 0  Injury with Fall? 0 0 0 0 0  Risk  for fall due to : No Fall Risks No Fall Risks No Fall Risks No Fall Risks   Follow up Falls evaluation completed Falls evaluation completed Falls evaluation completed Falls evaluation completed     [place]:  Any stairs in or around the home? Yes  If so, are there any without handrails? No  Home free of loose throw rugs in walkways, pet beds, electrical cords, etc? No  Adequate lighting in your home to reduce risk of falls? No   ASSISTIVE DEVICES UTILIZED TO PREVENT FALLS:  Life alert? No  Use of a cane, walker or w/c? No  Grab bars in the bathroom? No  Shower chair or bench in shower? No  Elevated toilet seat or a  handicapped toilet? No     Cognitive Function:    02/04/2021    2:41 PM  MMSE - Mini Mental State Exam  Not completed: Unable to complete        02/04/2021    2:41 PM 12/31/2019    1:35 PM 12/28/2018   10:50 AM 12/26/2017    9:20 AM 12/27/2016    1:43 PM  6CIT Screen  What Year? 0 points 0 points 0 points 0 points 0 points  What month? 0 points 0 points 0 points 0 points 0 points  What time? 0 points 0 points 0 points 0 points 0 points  Count back from 20 0 points 0 points 0 points 0 points 0 points  Months in reverse 0 points 0 points 0 points 0 points 0 points  Repeat phrase 0 points 0 points 0 points 4 points 0 points  Total Score 0 points 0 points 0 points 4 points 0 points    Immunizations Immunization History  Administered Date(s) Administered   Fluad Quad(high Dose 65+) 12/25/2020, 12/22/2021   Influenza Split 04/24/2012   Influenza,inj,Quad PF,6+ Mos 01/25/2014, 03/14/2015, 05/24/2016, 12/27/2016, 11/15/2017, 12/12/2018, 12/04/2019   PFIZER(Purple Top)SARS-COV-2 Vaccination 04/18/2019, 05/09/2019, 12/20/2019   Pfizer Covid-19 Vaccine Bivalent Booster 70yr & up 12/08/2020   Pneumococcal Conjugate-13 05/24/2016   Pneumococcal Polysaccharide-23 12/26/2017   Tdap 04/24/2012   Zoster, Live 09/13/2013    TDAP status: Up to date  Flu Vaccine status: Up to date  Pneumococcal vaccine status: Up to date  Covid-19 vaccine status: Completed vaccines  Qualifies for Shingles Vaccine? Yes   Zostavax completed No   Shingrix Completed?: No.    Education has been provided regarding the importance of this vaccine. Patient has been advised to call insurance company to determine out of pocket expense if they have not yet received this vaccine. Advised may also receive vaccine at local pharmacy or Health Dept. Verbalized acceptance and understanding.  Screening Tests Health Maintenance  Topic Date Due   Zoster Vaccines- Shingrix (1 of 2) Never done   COLONOSCOPY (Pts  45-42yrInsurance coverage will need to be confirmed)  10/25/2021   COVID-19 Vaccine (5 - 2023-24 season) 11/06/2021   OPHTHALMOLOGY EXAM  03/05/2022   DTaP/Tdap/Td (2 - Td or Tdap) 04/24/2022   HEMOGLOBIN A1C  06/23/2022   FOOT EXAM  08/22/2022   Diabetic kidney evaluation - eGFR measurement  12/23/2022   Diabetic kidney evaluation - Urine ACR  12/23/2022   Medicare Annual Wellness (AWV)  02/17/2023   MAMMOGRAM  01/07/2024   Pneumonia Vaccine 6580Years old  Completed   INFLUENZA VACCINE  Completed   DEXA SCAN  Completed   Hepatitis C Screening  Completed   HPV VACCINES  Aged  Out    Health Maintenance  Health Maintenance Due  Topic Date Due   Zoster Vaccines- Shingrix (1 of 2) Never done   COLONOSCOPY (Pts 45-54yr Insurance coverage will need to be confirmed)  10/25/2021   COVID-19 Vaccine (5 - 2023-24 season) 11/06/2021     Mammogram status: Completed 01/06/2022. Repeat every year  Bone Density status: Completed 02/06/2013. Results reflect: Bone density results: OSTEOPOROSIS. Repeat every 3 years.  Lung Cancer Screening: (Low Dose CT Chest recommended if Age 72-80years, 30 pack-year currently smoking OR have quit w/in 15years.) does not qualify.   Lung Cancer Screening Referral: NA  Additional Screening:  Hepatitis C Screening: does not qualify; Completed   Vision Screening: Recommended annual ophthalmology exams for early detection of glaucoma and other disorders of the eye. Is the patient up to date with their annual eye exam?  Yes  Who is the provider or what is the name of the office in which the patient attends annual eye exams? [facility]   Dental Screening: Recommended annual dental exams for proper oral hygiene  Community Resource Referral / Chronic Care Management: CRR required this visit?  No   CCM required this visit?  No      Plan:     I have personally reviewed and noted the following in the patient's chart:   Medical and social  history Use of alcohol, tobacco or illicit drugs  Current medications and supplements including opioid prescriptions. Patient is not currently taking opioid prescriptions. Functional ability and status Nutritional status Physical activity Advanced directives List of other physicians Hospitalizations, surgeries, and ER visits in previous 12 months Vitals Screenings to include cognitive, depression, and falls Referrals and appointments  In addition, I have reviewed and discussed with patient certain preventive protocols, quality metrics, and best practice recommendations. A written personalized care plan for preventive services as well as general preventive health recommendations were provided to patient.     KJohny Drilling [place]  02/16/2022   Nurse Notes:  Ms. PMigliaccio, Thank you for taking time to come for your Medicare Wellness Visit. I appreciate your ongoing commitment to your health goals. Please review the following plan we discussed and let me know if I can assist you in the future.   These are the goals we discussed:  Goals      DIET - INCREASE WATER INTAKE     Eat more fruits and vegetables     Exercise 3x per week (30 min per time)     Glycemic Management Optimized     Evidence-based guidance:  Anticipate A1C testing (point-of-care) every 3 to 6 months based on goal attainment.  Review mutually-set A1C goal or target range.  Anticipate screening for thyroid dysfunction, adrenal dysfunction and celiac disease based on presenting signs/symptoms.  Anticipate use of insulin, multidose or continuous subcutaneous infusion with periodic adjustments; consider active involvement of pharmacist.  Explore child and caregiver fear of hypoglycemic episodes that may impact adherence to treatment plan, such as withholding insulin and allowing blood sugars to be ?oa little? high.  Provide medical nutrition therapy and development of individualized eating plan.  Compare child or  caregiver reported symptoms of hypo or hyperglycemia to blood glucose levels, diet and fluid intake, current medications, psychosocial and physiologic stressors, change in activity and barriers to care adherence.  Promote self-monitoring of blood glucose levels, interval or continuous.  Assess and address barriers regarding adherence to treatment and self-management plan, including patient factors, such as family cohesion,  age, developmental ability, depression, anxiety, fear of hypoglycemia or weight gain, as   well as medication cost, side effects and complicated regimen.  Encourage developmentally-appropriate balance between dependence and independence in care, especially when intensifying treatment, such as continuous blood glucose monitoring or the use of an insulin pump.  Consider referral to community-based diabetes education program, visiting nurse, community health worker or health coach.  Initiate or review diabetes medical management plan for school that may include storage of insulin and clean, private location for insulin administration or blood glucose monitoring.   Notes: pt wants to regulate sugars.      Increase physical activity     Walking for 30 mins for 3 days a week      Patient Stated     More active         This is a list of the screening recommended for you and due dates:  Health Maintenance  Topic Date Due   Zoster (Shingles) Vaccine (1 of 2) Never done   Colon Cancer Screening  10/25/2021   COVID-19 Vaccine (5 - 2023-24 season) 11/06/2021   Eye exam for diabetics  03/05/2022   DTaP/Tdap/Td vaccine (2 - Td or Tdap) 04/24/2022   Hemoglobin A1C  06/23/2022   Complete foot exam   08/22/2022   Yearly kidney function blood test for diabetes  12/23/2022   Yearly kidney health urinalysis for diabetes  12/23/2022   Medicare Annual Wellness Visit  02/17/2023   Mammogram  01/07/2024   Pneumonia Vaccine  Completed   Flu Shot  Completed   DEXA scan (bone density  measurement)  Completed   Hepatitis C Screening: USPSTF Recommendation to screen - Ages 9-79 yo.  Completed   HPV Vaccine  Aged Out

## 2022-03-15 ENCOUNTER — Telehealth: Payer: Self-pay

## 2022-03-15 NOTE — Telephone Encounter (Signed)
Patient needing to find out if Dr. Alyson Ingles wants her to take antibiotics before her procedure in February?   Please advise.  Call back:  [phone]  Thanks, Helene Kelp

## 2022-03-16 NOTE — Telephone Encounter (Signed)
Please advise on medication question for surgery

## 2022-03-29 NOTE — Telephone Encounter (Signed)
I called patient and discussed recent OV note with patient- patient last OV discussed with Dr. Alyson Ingles she would f/u with repeat KUB- patient stated she did want to follow up before deciding on surgery.  Patient has appt 02/26 to discuss moving forward with surgery.

## 2022-04-27 ENCOUNTER — Ambulatory Visit: Payer: Medicare Other | Admitting: Family Medicine

## 2022-05-03 ENCOUNTER — Ambulatory Visit: Payer: 59 | Admitting: Urology

## 2022-05-03 ENCOUNTER — Ambulatory Visit (HOSPITAL_COMMUNITY)
Admission: RE | Admit: 2022-05-03 | Discharge: 2022-05-03 | Disposition: A | Discharge status: Home / Self Care | Payer: 59 | Source: Ambulatory Visit | Attending: Urology | Admitting: Urology

## 2022-05-03 ENCOUNTER — Telehealth: Payer: Self-pay

## 2022-05-03 ENCOUNTER — Encounter: Payer: Self-pay | Admitting: Urology

## 2022-05-03 VITALS — BP 135/86 | HR 101

## 2022-05-03 DIAGNOSIS — R3915 Urgency of urination: Secondary | ICD-10-CM | POA: Diagnosis not present

## 2022-05-03 DIAGNOSIS — Z87440 Personal history of urinary (tract) infections: Secondary | ICD-10-CM

## 2022-05-03 DIAGNOSIS — R300 Dysuria: Secondary | ICD-10-CM

## 2022-05-03 DIAGNOSIS — N390 Urinary tract infection, site not specified: Secondary | ICD-10-CM | POA: Diagnosis not present

## 2022-05-03 DIAGNOSIS — N200 Calculus of kidney: Secondary | ICD-10-CM | POA: Insufficient documentation

## 2022-05-03 MED ORDER — MIRABEGRON ER 25 MG PO TB24: 25.0000 mg | ORAL_TABLET | Freq: Every day | ORAL | 5 refills | Status: DC

## 2022-05-03 NOTE — Telephone Encounter (Signed)
Left vm for patient to get KUB done before appt.

## 2022-05-03 NOTE — Telephone Encounter (Addendum)
Patient called in about the direction on how to take her medication Myrbetriq. Patient states that she thought Dr. Alyson Ingles told her to take 1/2 of the Myrbetriq but she want clarification because the prescription states for her not to crush, break or chew the medication. Made patient aware that I will send a task to Dr. Alyson Ingles and someone will reach back out. Patient voiced understanding.

## 2022-05-03 NOTE — Progress Notes (Unsigned)
05/03/2022 12:07 PM   [name] 1950/04/09 [id number]  Referring provider: Fayrene Helper, MD [address], [place],  [place] [address]  No chief complaint on file.   HPI:    PMH: Past Medical History:  Diagnosis Date   BACK PAIN, THORACIC REGION, RIGHT 01/27/2010   Qualifier: Diagnosis of  By: Moshe Cipro MD, Jerrel Ivory of foot 07/15/2017   Essential hypertension    Headache(784.0)    Hematuria 02/24/2016   History of kidney stones    Hyperlipidemia    Irregular heart beats    Microscopic hematuria 02/24/2016   Neck pain on right side 01/23/2013   Piles (hemorrhoids) 06/22/2011   Renal calculus, right 05/31/2016   Shoulder pain, right 01/25/2014   SKIN TAG 06/13/2008   Qualifier: Diagnosis of  By: Moshe Cipro MD, Margaret     TIA (transient ischemic attack) 06/19/2016   Type 2 diabetes mellitus ([place])    Vaginitis     Surgical History: Past Surgical History:  Procedure Laterality Date   COLONOSCOPY N/A 10/26/2018   Procedure: COLONOSCOPY;  Surgeon: Rogene Houston, MD;  Location: [facility];  Service: Endoscopy;  Laterality: N/A;  830-10:30am   cyst removed Right 1998   cyst- removed from right wrist   CYSTOSCOPY WITH RETROGRADE PYELOGRAM, URETEROSCOPY AND STENT PLACEMENT Right 05/19/2020   Procedure: CYSTOSCOPY WITH RETROGRADE PYELOGRAM, URETEROSCOPY AND STENT PLACEMENT;  Surgeon: Cleon Gustin, MD;  Location: [facility];  Service: Urology;  Laterality: Right;   CYSTOSCOPY WITH RETROGRADE PYELOGRAM, URETEROSCOPY AND STENT PLACEMENT Right 06/26/2020   Procedure: CYSTOSCOPY WITH RETROGRADE PYELOGRAM, URETEROSCOPY AND STENT EXCHANGE;  Surgeon: Cleon Gustin, MD;  Location: [facility];  Service: Urology;  Laterality: Right;   EXTRACORPOREAL SHOCK WAVE LITHOTRIPSY Right 05/29/2018   Procedure: EXTRACORPOREAL SHOCK WAVE LITHOTRIPSY (ESWL);  Surgeon: Kathie Rhodes, MD;  Location: [facility];  Service: Urology;  Laterality: Right;   HOLMIUM LASER  APPLICATION Right [id number]   Procedure: HOLMIUM LASER APPLICATION;  Surgeon: Cleon Gustin, MD;  Location: [facility];  Service: Urology;  Laterality: Right;   lithotrpsy     over 20 years   NEPHROLITHOTOMY Right 05/31/2016   Procedure: NEPHROLITHOTOMY PERCUTANEOUS WITH SURGEON ACCESS;  Surgeon: Cleon Gustin, MD;  Location: [facility];  Service: Urology;  Laterality: Right;   POLYPECTOMY  10/26/2018   Procedure: POLYPECTOMY;  Surgeon: Rogene Houston, MD;  [facility];  Service: Endoscopy;;  colon   TOTAL ABDOMINAL HYSTERECTOMY W/ BILATERAL SALPINGOOPHORECTOMY  1998   TUBAL LIGATION      Home Medications:  Allergies as of 05/03/2022       Reactions   Onglyza [saxagliptin Hydrochloride] Swelling   Tongue swell   Pravastatin Other (See Comments)   Muscle aches   Ace Inhibitors Cough        Medication List        Accurate as of May 03, 2022 12:07 PM. If you have any questions, ask your nurse or doctor.          acetaminophen 500 MG tablet Commonly known as: TYLENOL Take 1,000 mg by mouth every 6 (six) hours as needed for headache or moderate pain.   aspirin EC 81 MG tablet Take 1 tablet (81 mg total) by mouth daily.   atorvastatin 10 MG tablet Commonly known as: LIPITOR TAKE ONE TABLET BY MOUTH EVERY MON, WED, AND FRI, AND HALF A TABLET TUES, THURS, SAT, AND SUN   B-D ULTRAFINE III SHORT  PEN 31G X 8 MM Misc Generic drug: Insulin Pen Needle FOR USE WITH INSULIN ONCE DAILY DX E11.9   clotrimazole-betamethasone cream Commonly known as: LOTRISONE Apply 1 application topically 2 (two) times daily. What changed:  when to take this reasons to take this   ferrous sulfate 325 (65 FE) MG tablet TAKE 1 TABLET BY MOUTH 2 TIMES DAILY WITH A MEAL.   Lantus SoloStar 100 UNIT/ML Solostar Pen Generic drug: insulin glargine INJECT 22 UNITS INTO THE SKIN DAILY AT 10 PM.   losartan 25 MG tablet Commonly known as: COZAAR TAKE HALF TABLET ONCE DAILY BY  MOUTH   Lubricant Eye Drops 0.4-0.3 % Soln Generic drug: Polyethyl Glycol-Propyl Glycol Place 1 drop into both eyes 3 (three) times daily as needed (dry/irritated eyes.).   metFORMIN 1000 MG tablet Commonly known as: GLUCOPHAGE TAKE 1 TABLET (1,000 MG TOTAL) BY MOUTH TWICE A DAY WITH FOOD   mirabegron ER 25 MG Tb24 tablet Commonly known as: MYRBETRIQ Take 1 tablet (25 mg total) by mouth daily.   OneTouch Delica Lancets 99991111 Misc Once daily dx e11.9 (give lancects that go with pts meter)   OneTouch Ultra test strip Generic drug: glucose blood Use as instructed   traMADol 50 MG tablet Commonly known as: Ultram Take 1 tablet (50 mg total) by mouth every 6 (six) hours as needed for moderate pain.   Vitamin D3 50 MCG (2000 UT) Tabs Generic drug: Cholecalciferol Take 2,000 Units by mouth daily.        Allergies:  Allergies  Allergen Reactions   Onglyza [Saxagliptin Hydrochloride] Swelling    Tongue swell   Pravastatin Other (See Comments)    Muscle aches   Ace Inhibitors Cough    Family History: Family History  Problem Relation Age of Onset   Alcohol abuse Mother    Esophageal cancer Father    Alcohol abuse Father    Diabetes Sister    Diabetes Sister    Hypertension Sister    Heart attack Sister        in 71's   Seizures Brother     Social History:  reports that she has never smoked. She has never used smokeless tobacco. She reports that she does not drink alcohol and does not use drugs.  ROS: All other review of systems were reviewed and are negative except what is noted above in HPI  Physical Exam: BP 135/86   Pulse (!) 101   Constitutional:  Alert and oriented, No acute distress. HEENT: [place] AT, moist mucus membranes.  Trachea midline, no masses. Cardiovascular: No clubbing, cyanosis, or edema. Respiratory: Normal respiratory effort, no increased work of breathing. GI: Abdomen is soft, nontender, nondistended, no abdominal masses GU: No CVA tenderness.   Lymph: No cervical or inguinal lymphadenopathy. Skin: No rashes, bruises or suspicious lesions. Neurologic: Grossly intact, no focal deficits, moving all 4 extremities. Psychiatric: Normal mood and affect.  Laboratory Data: Lab Results  Component Value Date   WBC 4.5 10/15/2021   HGB 12.3 10/15/2021   HCT 37.9 10/15/2021   MCV 88 10/15/2021   PLT 460 (H) 10/15/2021    Lab Results  Component Value Date   CREATININE 0.67 12/22/2021    No results found for: "PSA"  No results found for: "TESTOSTERONE"  Lab Results  Component Value Date   HGBA1C 7.7 (H) 12/22/2021    Urinalysis    Component Value Date/Time   COLORURINE YELLOW 10/08/2021 2136   APPEARANCEUR Cloudy (A) 01/20/2022 1336   LABSPEC 1.014  10/08/2021 2136   PHURINE 5.0 10/08/2021 2136   GLUCOSEU Negative 01/20/2022 1336   HGBUR LARGE (A) 10/08/2021 2136   BILIRUBINUR Negative 01/20/2022 1336   KETONESUR negative 12/22/2021 1004   KETONESUR 5 (A) 10/08/2021 2136   PROTEINUR 2+ (A) 01/20/2022 1336   PROTEINUR 100 (A) 10/08/2021 2136   UROBILINOGEN 0.2 12/22/2021 1004   UROBILINOGEN 0.2 01/25/2014 0845   NITRITE Negative 01/20/2022 1336   NITRITE NEGATIVE 10/08/2021 2136   LEUKOCYTESUR 2+ (A) 01/20/2022 1336   LEUKOCYTESUR LARGE (A) 10/08/2021 2136    Lab Results  Component Value Date   LABMICR See below: 01/20/2022   WBCUA >30 (A) 01/20/2022   LABEPIT 0-10 01/20/2022   MUCUS Present 10/15/2021   BACTERIA Few 01/20/2022    Pertinent Imaging: *** Results for orders placed in visit on 01/18/22  DG Abd 1 View  Narrative CLINICAL DATA:  Kidney stones  EXAM: ABDOMEN - 1 VIEW  COMPARISON:  10/05/2021  FINDINGS: 8 mm calculus projects over inferior pole LEFT kidney.  Questionable calculus versus stool artifact or tip of RIGHT twelfth rib measuring 6 mm diameter at mid RIGHT kidney.  Asymmetric density projects over LEFT SI joint, unchanged, present since at least 04/10/2020, likely osseous  in origin.  Increased stool in colon.  Nonobstructive bowel gas pattern.  Bones demineralized.  IMPRESSION: 8 mm LEFT renal calculus.  Questionable 6 mm RIGHT renal calculus versus artifact.   Electronically Signed By: Lavonia Dana M.D. On: 01/21/2022 16:55  No results found for this or any previous visit.  No results found for this or any previous visit.  No results found for this or any previous visit.  Results for orders placed during the hospital encounter of 09/02/20  Ultrasound renal complete  Narrative CLINICAL DATA:  Nephrolithiasis  EXAM: RENAL / URINARY TRACT ULTRASOUND COMPLETE  COMPARISON:  July 05, 2019 February 14, 2020  FINDINGS: Right Kidney:  Renal measurements: 10.5 x 5.0 x 6.2 cm = volume: 168 mL. Echogenicity within normal limits. There is decreased pelviectasis in comparison to prior. No hydronephrosis. Mild prominence of the proximal ureter, similar in comparison prior. Previously described nephrolithiasis within the renal pelvis is no longer visualized within the pelvis. There is a 1.4 cm nephrolithiasis within the inferior pole of the RIGHT kidney.  Left Kidney:  Renal measurements: 11.4 x 7.5 x 5.2 cm = volume: 231 mL. Echogenicity within normal limits. No hydronephrosis. There is a 19 mm nephrolithiasis within the inferior pole of the LEFT kidney.  Bladder:  Appears normal for degree of bladder distention.  Other:  None.  IMPRESSION: 1. Decreased pelviectasis of the RIGHT kidney in comparison to prior. No frank hydronephrosis. 2. There are bilateral nephrolithiasis.   Electronically Signed By: Valentino Saxon MD On: 09/03/2020 10:30  No valid procedures specified. No results found for this or any previous visit.  No results found for this or any previous visit.   Assessment & Plan:    1. Nephrolithiasis -followup 6 months with KUB  2. Urgency of urination Restart mirabegron '25mg'$  daily  3. Recurrent  UTI -urine for culture   No follow-ups on file.  [name], MD  [facility] [place]

## 2022-05-03 NOTE — Telephone Encounter (Signed)
Return call to patient making her aware that Dr. Alyson Ingles gave verbal for to break the Myrbetriq in half. Patient voiced understanding

## 2022-05-03 NOTE — Patient Instructions (Signed)

## 2022-05-04 ENCOUNTER — Encounter: Payer: Self-pay | Admitting: Urology

## 2022-05-04 ENCOUNTER — Ambulatory Visit (INDEPENDENT_AMBULATORY_CARE_PROVIDER_SITE_OTHER): Payer: 59 | Admitting: Family Medicine

## 2022-05-04 ENCOUNTER — Encounter: Payer: Self-pay | Admitting: Family Medicine

## 2022-05-04 VITALS — BP 142/72 | HR 109 | Ht 63.0 in | Wt 138.0 lb

## 2022-05-04 DIAGNOSIS — M4722 Other spondylosis with radiculopathy, cervical region: Secondary | ICD-10-CM

## 2022-05-04 DIAGNOSIS — E1169 Type 2 diabetes mellitus with other specified complication: Secondary | ICD-10-CM | POA: Diagnosis not present

## 2022-05-04 DIAGNOSIS — M47812 Spondylosis without myelopathy or radiculopathy, cervical region: Secondary | ICD-10-CM | POA: Insufficient documentation

## 2022-05-04 DIAGNOSIS — K635 Polyp of colon: Secondary | ICD-10-CM

## 2022-05-04 DIAGNOSIS — Z794 Long term (current) use of insulin: Secondary | ICD-10-CM | POA: Diagnosis not present

## 2022-05-04 DIAGNOSIS — E782 Mixed hyperlipidemia: Secondary | ICD-10-CM

## 2022-05-04 DIAGNOSIS — I10 Essential (primary) hypertension: Secondary | ICD-10-CM

## 2022-05-04 LAB — URINALYSIS, ROUTINE W REFLEX MICROSCOPIC
Bilirubin, UA: NEGATIVE
Glucose, UA: NEGATIVE
Ketones, UA: NEGATIVE
Nitrite, UA: NEGATIVE
Specific Gravity, UA: 1.02 (ref 1.005–1.030)
Urobilinogen, Ur: 0.2 mg/dL (ref 0.2–1.0)
pH, UA: 6.5 (ref 5.0–7.5)

## 2022-05-04 LAB — MICROSCOPIC EXAMINATION
RBC, Urine: >30 /hpf — AB (ref 0–2)
WBC, UA: >30 /hpf — AB (ref 0–5)

## 2022-05-04 MED ORDER — LANTUS SOLOSTAR 100 UNIT/ML ~~LOC~~ SOPN: 30.0000 [IU] | PEN_INJECTOR | Freq: Every day | SUBCUTANEOUS | 99 refills | Status: DC

## 2022-05-04 MED ORDER — LOSARTAN POTASSIUM 25 MG PO TABS: 25.0000 mg | ORAL_TABLET | Freq: Every day | ORAL | 2 refills | Status: DC

## 2022-05-04 NOTE — Progress Notes (Signed)
[name]     MRN: [id number]      DOB: [birth year]   HPI [name] is here for follow up and re-evaluation of chronic medical conditions, medication management and review of any available recent lab and radiology data.  Preventive health is updated, specifically  Cancer screening and Immunization.   Needs to follow through on referrals. Needs colonoscopy, overdue , will get this dose C/o intermittent eye pain and blurry vision, needs diabetic eye exam , will call back with Provide she wants to see C/o uncontrolled blood sugar , evening blood sugar varying between 200 and 300 C/o RUE pain, which she has had in the past and is attributed to C spine disease The PT denies any adverse reactions to current medications since the last visit.  ROS Denies recent fever or chills. Denies sinus pressure, nasal congestion, ear pain or sore throat. Denies chest congestion, productive cough or wheezing. Denies chest pains, palpitations and leg swelling Denies abdominal pain, nausea, vomiting,diarrhea or constipation.   Denies dysuria, frequency, hesitancy or incontinence. Denies joint pain, swelling and limitation in mobility. Denies headaches, seizures, numbness, or tingling. Denies depression, anxiety or insomnia. Denies skin break down or rash.   PE  BP (!) 142/72 (BP Location: Left Arm, Patient Position: Sitting, Cuff Size: Normal)   Pulse (!) 109   Ht '5\' 3"'$  (1.6 m)   Wt 138 lb 0.6 oz (62.6 kg)   SpO2 94%   BMI 24.45 kg/m   Patient alert and oriented and in no cardiopulmonary distress.  HEENT: No facial asymmetry, EOMI,     Neck decreased ROM Chest: Clear to auscultation bilaterally.  CVS: S1, S2 no murmurs, no S3.Regular rate.  ABD: Soft non tender.   Ext: No edema  MS: Adequate ROM spine, shoulders, hips and knees.  Skin: Intact, no ulcerations or rash noted.  Psych: Good eye contact, normal affect. Memory intact not anxious or depressed appearing.  CNS: CN 2-12  intact, power,  normal throughout.no focal deficits noted.   Assessment & Plan  Essential hypertension Uncontrolled, increase dose of losartan DASH diet and commitment to daily physical activity for a minimum of 30 minutes discussed and encouraged, as a part of hypertension management. The importance of attaining a healthy weight is also discussed.     05/04/2022    2:57 PM 05/04/2022    2:51 PM 05/03/2022   11:51 AM 01/20/2022   11:42 AM 12/22/2021    9:43 AM 10/15/2021   11:53 AM 10/15/2021    9:04 AM  BP/Weight  Systolic BP A999333 Q000111Q A999333 A999333 A999333 A999333 [id number]  Diastolic BP 72 77 86 83 82 87 75  Wt. (Lbs)  138.04   134.08  131  BMI  24.45 kg/m2   23.75 kg/m2  23.21 kg/m2       Type 2 diabetes mellitus with other specified complication ([place]) Reports poor control Updated lab needed at/ before next visit. [name] is reminded of the importance of commitment to daily physical activity for 30 minutes or more, as able and the need to limit carbohydrate intake to 30 to 60 grams per meal to help with blood sugar control.   The need to take medication as prescribed, test blood sugar as directed, and to call between visits if there is a concern that blood sugar is uncontrolled is also discussed.   [name] is reminded of the importance of daily foot exam, annual eye examination, and good blood sugar, blood pressure and cholesterol control.  Latest Ref Rng & Units 12/22/2021   10:44 AM 12/22/2021   10:37 AM 10/15/2021   10:29 AM 10/08/2021    6:25 PM 08/24/2021   11:11 AM  Diabetic Labs  HbA1c 4.8 - 5.6 %  7.7    7.2   Micro/Creat Ratio 0 - 29 mg/g creat 386       Chol 100 - 199 mg/dL  182      HDL >39 mg/dL  85      Calc LDL 0 - 99 mg/dL  86      Triglycerides 0 - 149 mg/dL  58      Creatinine 0.57 - 1.00 mg/dL  0.67  0.72  0.61  0.63       05/04/2022    2:57 PM 05/04/2022    2:51 PM 05/03/2022   11:51 AM 01/20/2022   11:42 AM 12/22/2021    9:43 AM 10/15/2021   11:53 AM  10/15/2021    9:04 AM  BP/Weight  Systolic BP A999333 Q000111Q A999333 A999333 A999333 A999333 123456  Diastolic BP 72 77 86 83 82 87 75  Wt. (Lbs)  138.04   134.08  131  BMI  24.45 kg/m2   23.75 kg/m2  23.21 kg/m2      Latest Ref Rng & Units 08/21/2021    8:40 AM 03/05/2021   12:00 AM  Foot/eye exam completion dates  Eye Exam No Retinopathy  No Retinopathy      Foot Form Completion  Done      This result is from an external source.        Hyperlipidemia Hyperlipidemia:Low fat diet discussed and encouraged.   Lipid Panel  Lab Results  Component Value Date   CHOL 182 12/22/2021   HDL 85 12/22/2021   LDLCALC 86 12/22/2021   TRIG 58 12/22/2021   CHOLHDL 2.1 12/22/2021     Controlled, no change in medication   DJD (degenerative joint disease) of cervical spine Recent flare of right arm pain due to DJD C spine, trial of tylenol and topical rubs as needed, not severe

## 2022-05-04 NOTE — Assessment & Plan Note (Signed)
Recent flare of right arm pain due to DJD C spine, trial of tylenol and topical rubs as needed, not severe

## 2022-05-04 NOTE — Assessment & Plan Note (Signed)
Uncontrolled, increase dose of losartan DASH diet and commitment to daily physical activity for a minimum of 30 minutes discussed and encouraged, as a part of hypertension management. The importance of attaining a healthy weight is also discussed.     05/04/2022    2:57 PM 05/04/2022    2:51 PM 05/03/2022   11:51 AM 01/20/2022   11:42 AM 12/22/2021    9:43 AM 10/15/2021   11:53 AM 10/15/2021    9:04 AM  BP/Weight  Systolic BP A999333 Q000111Q A999333 A999333 A999333 A999333 123456  Diastolic BP 72 77 86 83 82 87 75  Wt. (Lbs)  138.04   134.08  131  BMI  24.45 kg/m2   23.75 kg/m2  23.21 kg/m2

## 2022-05-04 NOTE — Patient Instructions (Addendum)
F/U in 4 to 5 weeks re evaluate blood pressure and weight and diabetes  Labs today cmp and EGFr, HBA1C  You will be referred for eye exam , right eye pain and also left , and reduced  vision. Pls let us know who you want to see  Blood pressure is high, start losartan 25 mg ONE daily and pleasse stop adding salt  You are referred for colonoscopy  Start lantus 30 units every day  Goal for fasting blood sugar ranges from 80 to 120 and 2 hours after any meal or at bedtime should be between 130 to 170. Nurse please give sheet to record blood sugars  Thanks for choosing  [facility], we consider it a privelige to serve you.

## 2022-05-04 NOTE — Assessment & Plan Note (Signed)
Hyperlipidemia:Low fat diet discussed and encouraged.   Lipid Panel  Lab Results  Component Value Date   CHOL 182 12/22/2021   HDL 85 12/22/2021   LDLCALC 86 12/22/2021   TRIG 58 12/22/2021   CHOLHDL 2.1 12/22/2021     Controlled, no change in medication

## 2022-05-04 NOTE — Assessment & Plan Note (Signed)
Reports poor control Updated lab needed at/ before next visit. [name] is reminded of the importance of commitment to daily physical activity for 30 minutes or more, as able and the need to limit carbohydrate intake to 30 to 60 grams per meal to help with blood sugar control.   The need to take medication as prescribed, test blood sugar as directed, and to call between visits if there is a concern that blood sugar is uncontrolled is also discussed.   [name] is reminded of the importance of daily foot exam, annual eye examination, and good blood sugar, blood pressure and cholesterol control.     Latest Ref Rng & Units 12/22/2021   10:44 AM 12/22/2021   10:37 AM 10/15/2021   10:29 AM 10/08/2021    6:25 PM 08/24/2021   11:11 AM  Diabetic Labs  HbA1c 4.8 - 5.6 %  7.7    7.2   Micro/Creat Ratio 0 - 29 mg/g creat 386       Chol 100 - 199 mg/dL  182      HDL >39 mg/dL  85      Calc LDL 0 - 99 mg/dL  86      Triglycerides 0 - 149 mg/dL  58      Creatinine 0.57 - 1.00 mg/dL  0.67  0.72  0.61  0.63       05/04/2022    2:57 PM 05/04/2022    2:51 PM 05/03/2022   11:51 AM 01/20/2022   11:42 AM 12/22/2021    9:43 AM 10/15/2021   11:53 AM 10/15/2021    9:04 AM  BP/Weight  Systolic BP A999333 Q000111Q A999333 A999333 A999333 A999333 123456  Diastolic BP 72 77 86 83 82 87 75  Wt. (Lbs)  138.04   134.08  131  BMI  24.45 kg/m2   23.75 kg/m2  23.21 kg/m2      Latest Ref Rng & Units 08/21/2021    8:40 AM 03/05/2021   12:00 AM  Foot/eye exam completion dates  Eye Exam No Retinopathy  No Retinopathy      Foot Form Completion  Done      This result is from an external source.

## 2022-05-05 ENCOUNTER — Encounter (INDEPENDENT_AMBULATORY_CARE_PROVIDER_SITE_OTHER): Payer: Self-pay | Admitting: *Deleted

## 2022-05-05 LAB — HEMOGLOBIN A1C
Est. average glucose Bld gHb Est-mCnc: 189 mg/dL
Hgb A1c MFr Bld: 8.2 % — ABNORMAL HIGH (ref 4.8–5.6)

## 2022-05-05 LAB — CMP14+EGFR
ALT: 14 IU/L (ref 0–32)
AST: 14 IU/L (ref 0–40)
Albumin/Globulin Ratio: 1.6 (ref 1.2–2.2)
Albumin: 4.5 g/dL (ref 3.8–4.8)
Alkaline Phosphatase: 117 IU/L (ref 44–121)
BUN/Creatinine Ratio: 19 (ref 12–28)
BUN: 14 mg/dL (ref 8–27)
Bilirubin Total: 0.4 mg/dL (ref 0.0–1.2)
CO2: 24 mmol/L (ref 20–29)
Calcium: 9.8 mg/dL (ref 8.7–10.3)
Chloride: 99 mmol/L (ref 96–106)
Creatinine, Ser: 0.75 mg/dL (ref 0.57–1.00)
Globulin, Total: 2.9 g/dL (ref 1.5–4.5)
Glucose: 301 mg/dL — ABNORMAL HIGH (ref 70–99)
Potassium: 3.8 mmol/L (ref 3.5–5.2)
Sodium: 141 mmol/L (ref 134–144)
Total Protein: 7.4 g/dL (ref 6.0–8.5)
eGFR: 85 mL/min/{1.73_m2} (ref >59)

## 2022-05-07 LAB — URINE CULTURE

## 2022-05-10 ENCOUNTER — Telehealth: Payer: Self-pay

## 2022-05-10 MED ORDER — NITROFURANTOIN MONOHYD MACRO 100 MG PO CAPS: 100.0000 mg | ORAL_CAPSULE | Freq: Two times a day (BID) | ORAL | 0 refills | Status: DC

## 2022-05-10 NOTE — Telephone Encounter (Signed)
Patient called and made aware of positive urine culture and Macrobid sent to pharmacy.

## 2022-05-27 ENCOUNTER — Encounter: Payer: Self-pay | Admitting: General Surgery

## 2022-05-27 ENCOUNTER — Ambulatory Visit: Payer: 59 | Admitting: General Surgery

## 2022-05-27 VITALS — BP 158/89 | HR 101 | Temp 97.0°F | Resp 12 | Ht 63.0 in | Wt 138.0 lb

## 2022-05-27 DIAGNOSIS — D235 Other benign neoplasm of skin of trunk: Secondary | ICD-10-CM | POA: Diagnosis not present

## 2022-05-27 DIAGNOSIS — L720 Epidermal cyst: Secondary | ICD-10-CM

## 2022-05-27 DIAGNOSIS — L089 Local infection of the skin and subcutaneous tissue, unspecified: Secondary | ICD-10-CM | POA: Diagnosis not present

## 2022-05-27 MED ORDER — AMOXICILLIN-POT CLAVULANATE 875-125 MG PO TABS: 1.0000 | ORAL_TABLET | Freq: Two times a day (BID) | ORAL | 0 refills | Status: AC

## 2022-05-27 NOTE — Progress Notes (Signed)
[facility] Procedure Note  05/27/22  Pre-procedure Diagnosis:  Infected cyst    Post-procedure Diagnosis: Same   Procedure(s) Performed:  Incision and drainage of infected cyst (8cm in size)    Surgeon: Lanell Matar. Constance Haw, MD   Assistants: No qualified resident was available    Anesthesia: Lidocaine 1%    Specimens:  None    Estimated Blood Loss: Minimal  Wound Class: Dirty Infected    Procedure Indications: Ms. Pfost is a 72 yo known to me with multiple cyst on her back. She has been having issues with a large inflamed cyst for over 1 week. She reports no drainage but it is very swollen and red.  She says it is tender.  She has not had any fevers. We discussed incision and drainage and risk of bleeding, worsening infection, needing to pack wound which she is not going to be able to do and discussed that we will have to improvise and plan for other means of cleaning wound and keeping it drained. Discussed recurrence.   Physical Exam Findings: 8+cm cyst on the left back with swelling, tenderness and erythema on the edges    Procedure: The patient was taken to the procedure room and placed upright in a chair. The left back was prepared and draped in the usual sterile fashion. Lidocaine 1% was injected at the most fluctuant area. An incision was made and copious purulence was expressed and cyst components/ wall. The area was irrigated with saline. Loculations were broken up. Iodoform was packed and an ABD and pad were placed.    Final inspection revealed acceptable hemostasis. The patient tolerated the procedure well.   Future Appointments  Date Time Provider [place]  06/01/2022 11:15 AM Virl Cagey, MD RS-RS None  06/17/2022  8:40 AM Fayrene Helper, MD R[facility]-RPC [facility]  11/01/2022  1:10 PM McKenzie, Candee Furbish, MD AUR-AUR None  03/21/2023 11:00 AM RPC-ANNUAL WELLNESS VISIT RPC-RPC RPC    Come to the office tomorrow before 11:15Am.  They will  remove the packing and flush area with saline. Expect a lot of bloody pus drainage.   Keep area covered with pad and papertape. After Friday and the removal of the packing, get in the shower and let warm water run over the back at least once a day if not twice and cover the area after with a clean pad and paper tape. Take your antibiotic as prescribed.   Curlene Labrum, MD [facility] [address] [place], [place] [address] [phone] (office)

## 2022-05-27 NOTE — Patient Instructions (Signed)
Come to the office tomorrow before 11:15Am.  They will remove the packing and flush area with saline. Expect a lot of bloody pus drainage.   Keep area covered with pad and papertape. After Friday and the removal of the packing, get in the shower and let warm water run over the back at least once a day if not twice and cover the area after with a clean pad and paper tape. Take your antibiotic as prescribed.

## 2022-06-01 ENCOUNTER — Encounter: Payer: Self-pay | Admitting: General Surgery

## 2022-06-01 ENCOUNTER — Other Ambulatory Visit: Payer: Self-pay

## 2022-06-01 ENCOUNTER — Ambulatory Visit (INDEPENDENT_AMBULATORY_CARE_PROVIDER_SITE_OTHER): Payer: 59 | Admitting: General Surgery

## 2022-06-01 VITALS — BP 122/72 | HR 98 | Temp 98.1°F | Resp 20 | Ht 63.0 in | Wt 139.0 lb

## 2022-06-01 DIAGNOSIS — L089 Local infection of the skin and subcutaneous tissue, unspecified: Secondary | ICD-10-CM

## 2022-06-01 DIAGNOSIS — L720 Epidermal cyst: Secondary | ICD-10-CM

## 2022-06-01 NOTE — Patient Instructions (Signed)
Continue to shower and keep area covered when you go out.  It will continue to drain.  Call with issues or changes. Finish your antibiotic.

## 2022-06-01 NOTE — Progress Notes (Signed)
[facility]  Wound looking good. Some minor drainage. Irrigated wound. No issues reported. She has no one that can help her pack the area so this is not an option.   BP 122/72   Pulse 98   Temp 98.1 F (36.7 C) (Oral)   Resp 20   Ht 5\' 3"  (1.6 m)   Wt 139 lb (63 kg)   SpO2 95%   BMI 24.62 kg/m  Incision open, draining No erythema   Patient s/p infected cyst s/p I&D.  Continue to shower and keep area covered when you go out.  It will continue to drain.  Call with issues or changes. Finish your antibiotic.   Future Appointments  Date Time Provider [place]  06/16/2022  1:30 PM Virl Cagey, MD RS-RS None  06/17/2022  8:40 AM Fayrene Helper, MD [facility]  11/01/2022  1:10 PM McKenzie, Candee Furbish, MD AUR-AUR None  03/21/2023 11:00 AM RPC-ANNUAL WELLNESS VISIT R[facility]-RPC RPC   Curlene Labrum, MD [facility] [place], [place] [address] [phone] (office)

## 2022-06-16 ENCOUNTER — Ambulatory Visit: Payer: 59 | Admitting: General Surgery

## 2022-06-17 ENCOUNTER — Encounter: Payer: Self-pay | Admitting: Family Medicine

## 2022-06-17 ENCOUNTER — Ambulatory Visit (INDEPENDENT_AMBULATORY_CARE_PROVIDER_SITE_OTHER): Payer: 59 | Admitting: Family Medicine

## 2022-06-17 VITALS — BP 140/80 | HR 96 | Ht 63.0 in | Wt 138.0 lb

## 2022-06-17 DIAGNOSIS — K635 Polyp of colon: Secondary | ICD-10-CM

## 2022-06-17 DIAGNOSIS — I10 Essential (primary) hypertension: Secondary | ICD-10-CM | POA: Diagnosis not present

## 2022-06-17 DIAGNOSIS — E782 Mixed hyperlipidemia: Secondary | ICD-10-CM | POA: Diagnosis not present

## 2022-06-17 DIAGNOSIS — Z794 Long term (current) use of insulin: Secondary | ICD-10-CM

## 2022-06-17 DIAGNOSIS — R42 Dizziness and giddiness: Secondary | ICD-10-CM | POA: Diagnosis not present

## 2022-06-17 DIAGNOSIS — H9203 Otalgia, bilateral: Secondary | ICD-10-CM | POA: Diagnosis not present

## 2022-06-17 DIAGNOSIS — E1169 Type 2 diabetes mellitus with other specified complication: Secondary | ICD-10-CM | POA: Diagnosis not present

## 2022-06-17 DIAGNOSIS — Z1211 Encounter for screening for malignant neoplasm of colon: Secondary | ICD-10-CM | POA: Diagnosis not present

## 2022-06-17 LAB — GLUCOSE, POCT (MANUAL RESULT ENTRY): POC Glucose: 87 mg/dl (ref 70–99)

## 2022-06-17 MED ORDER — LOSARTAN POTASSIUM 50 MG PO TABS: 50.0000 mg | ORAL_TABLET | Freq: Every day | ORAL | 2 refills | Status: DC

## 2022-06-17 MED ORDER — MECLIZINE HCL 12.5 MG PO TABS: 12.5000 mg | ORAL_TABLET | Freq: Three times a day (TID) | ORAL | 0 refills | Status: DC | PRN

## 2022-06-17 NOTE — Patient Instructions (Signed)
F/U with blood sugar log and meds in 3 to 4 weeks, call if you need me sooner  You are referred to ENT regarding recurrent vertigo and ear pain   Test and record blood sugar every morning Goal for fasting blood sugar ranges from 80 to 120 and 2 hours after any meal or at bedtime should be between 130 to 170.  Blood pressure medicine is increased to 50 mg one daily, please  get this at your pharmacy and start new dose tomorrow  Thanks for choosing [facility] we consider it a privelige to serve you.

## 2022-06-18 ENCOUNTER — Other Ambulatory Visit: Payer: Self-pay | Admitting: Family Medicine

## 2022-06-21 ENCOUNTER — Encounter: Payer: Self-pay | Admitting: Family Medicine

## 2022-06-21 DIAGNOSIS — H9203 Otalgia, bilateral: Secondary | ICD-10-CM | POA: Insufficient documentation

## 2022-06-21 NOTE — Progress Notes (Signed)
[name]     MRN: [id number]      DOB: 01/15/1951   HPI [name] is here for follow up and re-evaluation of chronic medical conditions, medication management and review of any available recent lab and radiology data.  Preventive health is updated, specifically  Cancer screening and Immunization.   Questions or concerns regarding consultations or procedures which the PT has had in the interim are  addressed. The PT denies any adverse reactions to current medications since the last visit.  C/o recurrent dizzy spells for weeks, and especially in he morning Denies polyuria, polydipsia, blurred vision , or hypoglycemic episodes.   ROS Denies recent fever or chills. Denies sinus pressure, nasal congestion,  or sore throat. Denies chest congestion, productive cough or wheezing. Denies chest pains, palpitations and leg swelling Denies abdominal pain, nausea, vomiting,diarrhea or constipation.   Denies dysuria, frequency, hesitancy or incontinence. Denies joint pain, swelling and limitation in mobility. Denies headaches, seizures, numbness, or tingling. Denies depression, anxiety or insomnia. Denies skin break down or rash.   PE  BP (!) 140/80   Pulse 96   Ht  (1.6 m)   Wt 138 lb 0.6 oz (62.6 kg)   SpO2 93%   BMI 24.45 kg/m   Patient alert and oriented and in no cardiopulmonary distress.  HEENT: No facial asymmetry, EOMI,     Neck supple .No sinus tenderness, TM cerumen partially impacting right ear  Chest: Clear to auscultation bilaterally.  CVS: S1, S2 no murmurs, no S3.Regular rate.  ABD: Soft non tender.   Ext: No edema  MS: Adequate ROM spine, shoulders, hips and knees.  Skin: Intact, no ulcerations or rash noted.  Psych: Good eye contact, normal affect. Memory intact not anxious or depressed appearing.  CNS: CN 2-12 intact, power,  normal throughout.no focal deficits noted.   Assessment & Plan  Vertigo Recurrent vertigo for several weeks, refer  ENT  Otalgia, bilateral Refer ENT t evaluate bilateral ear pain, chronic  Hyperlipidemia Hyperlipidemia:Low fat diet discussed and encouraged.   Lipid Panel  Lab Results  Component Value Date   CHOL 182 12/22/2021   HDL 85 12/22/2021   LDLCALC 86 12/22/2021   TRIG 58 12/22/2021   CHOLHDL 2.1 12/22/2021     Updated lab needed at/ before next visit. Controlled, no change in medication   Essential hypertension DASH diet and commitment to daily physical activity for a minimum of 30 minutes discussed and encouraged, as a part of hypertension management. The importance of attaining a healthy weight is also discussed. Uncontrolled , increase med dose and re asess     06/17/2022    9:16 AM 06/17/2022    9:07 AM 06/17/2022    8:51 AM 06/17/2022    8:49 AM 06/01/2022   11:51 AM 05/27/2022    3:54 PM 05/04/2022    2:57 PM  BP/Weight  Systolic BP 140 128 142 149 122 158 142  Diastolic BP 80 80 87 82 72 89 72  Wt. (Lbs)    138.04 139 138   BMI    24.45 kg/m2 24.62 kg/m2 24.45 kg/m2      Elevated at visit  Type 2 diabetes mellitus with other specified complication (HCC) Uncontrolled [name] is reminded of the importance of commitment to daily physical activity for 30 minutes or more, as able and the need to limit carbohydrate intake to 30 to 60 grams per meal to help with blood sugar control.   The need to take  medication as prescribed, test blood sugar as directed, and to call between visits if there is a concern that blood sugar is uncontrolled is also discussed.   [name] is reminded of the importance of daily foot exam, annual eye examination, and good blood sugar, blood pressure and cholesterol control. Uncontrolled     Latest Ref Rng & Units 05/04/2022    3:48 PM 12/22/2021   10:44 AM 12/22/2021   10:37 AM 10/15/2021   10:29 AM 10/08/2021    6:25 PM  Diabetic Labs  HbA1c 4.8 - 5.6 % 8.2   7.7     Micro/Creat Ratio 0 - 29 mg/g creat  386      Chol 100 - 199 mg/dL    449     HDL >67 mg/dL   85     Calc LDL 0 - 99 mg/dL   86     Triglycerides 0 - 149 mg/dL   58     Creatinine 5.91 - 1.00 mg/dL 6.38   4.66  5.99  3.57       06/17/2022    9:16 AM 06/17/2022    9:07 AM 06/17/2022    8:51 AM 06/17/2022    8:49 AM 06/01/2022   11:51 AM 05/27/2022    3:54 PM 05/04/2022    2:57 PM  BP/Weight  Systolic BP 140 128 142 149 122 158 142  Diastolic BP 80 80 87 82 72 89 72  Wt. (Lbs)    138.04 139 138   BMI    24.45 kg/m2 24.62 kg/m2 24.45 kg/m2       Latest Ref Rng & Units 08/21/2021    8:40 AM 03/05/2021   12:00 AM  Foot/eye exam completion dates  Eye Exam No Retinopathy  No Retinopathy      Foot Form Completion  Done      This result is from an external source.

## 2022-06-21 NOTE — Assessment & Plan Note (Signed)
Refer ENT t evaluate bilateral ear pain, chronic

## 2022-06-21 NOTE — Assessment & Plan Note (Signed)
Recurrent vertigo for several weeks, refer ENT

## 2022-06-21 NOTE — Assessment & Plan Note (Addendum)
Uncontrolled [name] is reminded of the importance of commitment to daily physical activity for 30 minutes or more, as able and the need to limit carbohydrate intake to 30 to 60 grams per meal to help with blood sugar control.   The need to take medication as prescribed, test blood sugar as directed, and to call between visits if there is a concern that blood sugar is uncontrolled is also discussed.   [name] is reminded of the importance of daily foot exam, annual eye examination, and good blood sugar, blood pressure and cholesterol control. Uncontrolled     Latest Ref Rng & Units 05/04/2022    3:48 PM 12/22/2021   10:44 AM 12/22/2021   10:37 AM 10/15/2021   10:29 AM 10/08/2021    6:25 PM  Diabetic Labs  HbA1c 4.8 - 5.6 % 8.2   7.7     Micro/Creat Ratio 0 - 29 mg/g creat  386      Chol 100 - 199 mg/dL   557     HDL >32 mg/dL   85     Calc LDL 0 - 99 mg/dL   86     Triglycerides 0 - 149 mg/dL   58     Creatinine 2.02 - 1.00 mg/dL 5.42   7.06  2.37  6.28       06/17/2022    9:16 AM 06/17/2022    9:07 AM 06/17/2022    8:51 AM 06/17/2022    8:49 AM 06/01/2022   11:51 AM 05/27/2022    3:54 PM 05/04/2022    2:57 PM  BP/Weight  Systolic BP 140 128 142 149 122 158 142  Diastolic BP 80 80 87 82 72 89 72  Wt. (Lbs)    138.04 139 138   BMI    24.45 kg/m2 24.62 kg/m2 24.45 kg/m2       Latest Ref Rng & Units 08/21/2021    8:40 AM 03/05/2021   12:00 AM  Foot/eye exam completion dates  Eye Exam No Retinopathy  No Retinopathy      Foot Form Completion  Done      This result is from an external source.

## 2022-06-21 NOTE — Assessment & Plan Note (Signed)
Hyperlipidemia:Low fat diet discussed and encouraged.   Lipid Panel  Lab Results  Component Value Date   CHOL 182 12/22/2021   HDL 85 12/22/2021   LDLCALC 86 12/22/2021   TRIG 58 12/22/2021   CHOLHDL 2.1 12/22/2021     Updated lab needed at/ before next visit. Controlled, no change in medication

## 2022-06-21 NOTE — Assessment & Plan Note (Addendum)
DASH diet and commitment to daily physical activity for a minimum of 30 minutes discussed and encouraged, as a part of hypertension management. The importance of attaining a healthy weight is also discussed. Uncontrolled , increase med dose and re asess     06/17/2022    9:16 AM 06/17/2022    9:07 AM 06/17/2022    8:51 AM 06/17/2022    8:49 AM 06/01/2022   11:51 AM 05/27/2022    3:54 PM 05/04/2022    2:57 PM  BP/Weight  Systolic BP 140 128 142 149 122 158 142  Diastolic BP 80 80 87 82 72 89 72  Wt. (Lbs)    138.04 139 138   BMI    24.45 kg/m2 24.62 kg/m2 24.45 kg/m2      Elevated at visit

## 2022-06-23 ENCOUNTER — Encounter (INDEPENDENT_AMBULATORY_CARE_PROVIDER_SITE_OTHER): Payer: Self-pay | Admitting: *Deleted

## 2022-06-29 ENCOUNTER — Telehealth (INDEPENDENT_AMBULATORY_CARE_PROVIDER_SITE_OTHER): Payer: Self-pay | Admitting: Gastroenterology

## 2022-06-29 NOTE — Telephone Encounter (Signed)
Any room Thanks 

## 2022-06-29 NOTE — Telephone Encounter (Signed)
Who is your primary care physician: Dr.Margaret Lodema Hong  Reasons for the colonoscopy: Screening   Have you had a colonoscopy before?  Yes 3 years ago  Do you have family history of colon cancer? No  Previous colonoscopy with polyps removed? yes  Do you have a history colorectal cancer?   no  Are you diabetic? If yes, Type 1 or Type 2?    Yes type 2   Do you have a prosthetic or mechanical heart valve? no  Do you have a pacemaker/defibrillator?   no  Have you had endocarditis/atrial fibrillation? no  Have you had joint replacement within the last 12 months?  no  Do you tend to be constipated or have to use laxatives? no  Do you have any history of drugs or alchohol?  no  Do you use supplemental oxygen?  no  Have you had a stroke or heart attack within the last 6 months?no  Do you take weight loss medication? no  For female patients: have you had a hysterectomy?  yes                                     are you post menopausal?       yes                                            do you still have your menstrual cycle? no      Do you take any blood-thinning medications such as: (aspirin, warfarin, Plavix, Aggrenox)  yes  If yes we need the name, milligram, dosage and who is prescribing doctor aspirin 81 mg once daily Current Outpatient Medications on File Prior to Visit  Medication Sig Dispense Refill   acetaminophen (TYLENOL) 500 MG tablet Take 1,000 mg by mouth every 6 (six) hours as needed for headache or moderate pain.     aspirin EC 81 MG tablet Take 1 tablet (81 mg total) by mouth daily.     atorvastatin (LIPITOR) 10 MG tablet TAKE ONE TABLET BY MOUTH EVERY MON, WED, AND FRI, AND HALF A TABLET TUES, THURS, SAT, AND SUN 60 tablet 3   B-D ULTRAFINE III SHORT PEN 31G X 8 MM MISC FOR USE WITH INSULIN ONCE DAILY DX E11.9 100 each 5   Cholecalciferol (VITAMIN D3) 50 MCG (2000 UT) TABS Take 2,000 Units by mouth daily.     clotrimazole-betamethasone (LOTRISONE) cream Apply 1  application topically 2 (two) times daily. (Patient taking differently: Apply 1 application  topically daily as needed (for rash).) 45 g 1   ferrous sulfate 325 (65 FE) MG tablet TAKE 1 TABLET BY MOUTH 2 TIMES DAILY WITH A MEAL. (Patient taking differently: Take 325 mg by mouth 2 (two) times daily with a meal.) 180 tablet 1   insulin glargine (LANTUS SOLOSTAR) 100 UNIT/ML Solostar Pen Inject 30 Units into the skin daily. 15 mL PRN   losartan (COZAAR) 50 MG tablet Take 1 tablet (50 mg total) by mouth daily. 30 tablet 2   meclizine (ANTIVERT) 12.5 MG tablet Take 1 tablet (12.5 mg total) by mouth 3 (three) times daily as needed for dizziness. 30 tablet 0   metFORMIN (GLUCOPHAGE) 1000 MG tablet TAKE 1 TABLET (1,000 MG TOTAL) BY MOUTH TWICE A DAY WITH FOOD 180 tablet 1   mirabegron  ER (MYRBETRIQ) 25 MG TB24 tablet Take 1 tablet (25 mg total) by mouth daily. 30 tablet 5   OneTouch Delica Lancets 33G MISC Once daily dx e11.9 (give lancects that go with pts meter) 100 each 5   ONETOUCH ULTRA test strip USE AS INSTRUCTED 100 strip 5   Polyethyl Glycol-Propyl Glycol (LUBRICANT EYE DROPS) 0.4-0.3 % SOLN Place 1 drop into both eyes 3 (three) times daily as needed (dry/irritated eyes.).     No current facility-administered medications on file prior to visit.    Allergies  Allergen Reactions   Onglyza [Saxagliptin Hydrochloride] Swelling    Tongue swell   Pravastatin Other (See Comments)    Muscle aches   Ace Inhibitors Cough     Pharmacy:   Primary Insurance Name: [employer]/Medicare  Best number where you can be reached: [phone]

## 2022-07-01 MED ORDER — PEG 3350-KCL-NA BICARB-NACL 420 G PO SOLR: 4000.0000 mL | Freq: Once | ORAL | 0 refills | Status: AC

## 2022-07-01 NOTE — Telephone Encounter (Signed)
Pt contacted and TCS scheduled for 07/29/22 at 8:30 am. Prep sent to pharmacy and instructions mailed to pt.   Per [facility] Notification or Prior Authorization is not required for the requested services You are not required to submit a notification/prior authorization based on the information provided. If you have general questions about the prior authorization requirements, visit [email or web address] > Clinician Resources > Advance and Admission Notification Requirements. The number above acknowledges your notification. Please write this reference number down for future reference. If you would like to request an organization determination, please call us at [phone]. Decision ID #: [id number]

## 2022-07-01 NOTE — Addendum Note (Signed)
Addended by: Marlowe Shores on: 07/01/2022 02:37 PM   Modules accepted: Orders

## 2022-07-05 NOTE — Telephone Encounter (Signed)
Referral completed

## 2022-07-15 ENCOUNTER — Ambulatory Visit (INDEPENDENT_AMBULATORY_CARE_PROVIDER_SITE_OTHER): Payer: 59 | Admitting: Family Medicine

## 2022-07-15 ENCOUNTER — Encounter: Payer: Self-pay | Admitting: Family Medicine

## 2022-07-15 VITALS — BP 140/90 | HR 93 | Ht 63.0 in | Wt 136.0 lb

## 2022-07-15 DIAGNOSIS — Z794 Long term (current) use of insulin: Secondary | ICD-10-CM | POA: Diagnosis not present

## 2022-07-15 DIAGNOSIS — E1169 Type 2 diabetes mellitus with other specified complication: Secondary | ICD-10-CM

## 2022-07-15 DIAGNOSIS — I10 Essential (primary) hypertension: Secondary | ICD-10-CM

## 2022-07-15 DIAGNOSIS — E782 Mixed hyperlipidemia: Secondary | ICD-10-CM | POA: Diagnosis not present

## 2022-07-15 MED ORDER — LOSARTAN POTASSIUM 25 MG PO TABS
25.0000 mg | ORAL_TABLET | Freq: Every day | ORAL | 3 refills | Status: DC
Start: 2022-07-15 — End: 2022-09-29

## 2022-07-15 NOTE — Patient Instructions (Addendum)
F/U mid June, call if you need me sooner  Increase losartan to 50 mg one daily and 25 mg one daily, total of 75 mg daily as blood pressure is still too high  Nurse please give pt diabetic l;og sheet  Goal for fasting blood sugar ranges from 80 to 120 and 2 hours after any meal or at bedtime should be between 130 to 170.  CHANGE eating habits as discussed to control blood sugar  HBA`1C, chem 7 and eGFR, fasrting lipid and tSH 3 to 5 days before next appt  Please bring medications, diabertiuc log and blood pressure cuff to visit  Thanks for choosing [facility], we consider it a privelige to serve you.

## 2022-07-18 ENCOUNTER — Encounter: Payer: Self-pay | Admitting: Family Medicine

## 2022-07-18 NOTE — Assessment & Plan Note (Signed)
Uncontrolled, nees to monitor more closely and comply with treatment plan [name] is reminded of the importance of commitment to daily physical activity for 30 minutes or more, as able and the need to limit carbohydrate intake to 30 to 60 grams per meal to help with blood sugar control.   The need to take medication as prescribed, test blood sugar as directed, and to call between visits if there is a concern that blood sugar is uncontrolled is also discussed.   [name] is reminded of the importance of daily foot exam, annual eye examination, and good blood sugar, blood pressure and cholesterol control.     Latest Ref Rng & Units 05/04/2022    3:48 PM 12/22/2021   10:44 AM 12/22/2021   10:37 AM 10/15/2021   10:29 AM 10/08/2021    6:25 PM  Diabetic Labs  HbA1c 4.8 - 5.6 % 8.2   7.7     Micro/Creat Ratio 0 - 29 mg/g creat  386      Chol 100 - 199 mg/dL   161     HDL >09 mg/dL   85     Calc LDL 0 - 99 mg/dL   86     Triglycerides 0 - 149 mg/dL   58     Creatinine 6.04 - 1.00 mg/dL 5.40   9.81  1.91  4.78       07/15/2022   11:13 AM 07/15/2022   10:42 AM 07/15/2022   10:41 AM 06/17/2022    9:16 AM 06/17/2022    9:07 AM 06/17/2022    8:51 AM 06/17/2022    8:49 AM  BP/Weight  Systolic BP 140 130 143 140 128 142 149  Diastolic BP 90 79 80 80 80 87 82  Wt. (Lbs)   136    138.04  BMI   24.09 kg/m2    24.45 kg/m2      Latest Ref Rng & Units 08/21/2021    8:40 AM 03/05/2021   12:00 AM  Foot/eye exam completion dates  Eye Exam No Retinopathy  No Retinopathy      Foot Form Completion  Done      This result is from an external source.      Updated lab needed at/ before next visit.

## 2022-07-18 NOTE — Assessment & Plan Note (Signed)
Uncontrolled Increase losartan dose and re assess DASH diet and commitment to daily physical activity for a minimum of 30 minutes discussed and encouraged, as a part of hypertension management. The importance of attaining a healthy weight is also discussed.     07/15/2022   11:13 AM 07/15/2022   10:42 AM 07/15/2022   10:41 AM 06/17/2022    9:16 AM 06/17/2022    9:07 AM 06/17/2022    8:51 AM 06/17/2022    8:49 AM  BP/Weight  Systolic BP 140 130 143 140 128 142 149  Diastolic BP 90 79 80 80 80 87 82  Wt. (Lbs)   136    138.04  BMI   24.09 kg/m2    24.45 kg/m2

## 2022-07-18 NOTE — Assessment & Plan Note (Signed)
Hyperlipidemia:Low fat diet discussed and encouraged.   Lipid Panel  Lab Results  Component Value Date   CHOL 182 12/22/2021   HDL 85 12/22/2021   LDLCALC 86 12/22/2021   TRIG 58 12/22/2021   CHOLHDL 2.1 12/22/2021     Updated lab needed at/ before next visit.

## 2022-07-18 NOTE — Progress Notes (Signed)
[name]     MRN: [id number]      DOB: 20-Jun-1950  Chief Complaint  Patient presents with   Follow-up    Follow up patient did not bring medications    HPI [name] is here for follow up and re-evaluation of chronic medical conditions, medication management and review of any available recent lab and radiology data.  Preventive health is updated, specifically  Cancer screening and Immunization.   Questions or concerns regarding consultations or procedures which the PT has had in the interim are  addressed. The PT denies any adverse reactions to current medications since the last visit.  Still having a lot of variation in blood sugar  ROS Denies recent fever or chills. Denies sinus pressure, nasal congestion, ear pain or sore throat. Denies chest congestion, productive cough or wheezing. Denies chest pains, palpitations and leg swelling Denies abdominal pain, nausea, vomiting,diarrhea or constipation.   Denies dysuria, frequency, hesitancy or incontinence. Denies joint pain, swelling and limitation in mobility. Denies headaches, seizures, numbness, or tingling. Denies depression, anxiety or insomnia. Denies skin break down or rash.   PE  BP (!) 140/90   Pulse 93   Ht 5\' 3"  (1.6 m)   Wt 136 lb (61.7 kg)   SpO2 94%   BMI 24.09 kg/m   Patient alert and oriented and in no cardiopulmonary distress.  HEENT: No facial asymmetry, EOMI,     Neck supple .  Chest: Clear to auscultation bilaterally.  CVS: S1, S2 no murmurs, no S3.Regular rate.  ABD: Soft non tender.   Ext: No edema  MS: Adequate ROM spine, shoulders, hips and knees.  Skin: Intact, no ulcerations or rash noted.  Psych: Good eye contact, normal affect. Memory intact not anxious or depressed appearing.  CNS: CN 2-12 intact, power,  normal throughout.no focal deficits noted.   Assessment & Plan  Essential hypertension Uncontrolled Increase losartan dose and re assess DASH diet and commitment  to daily physical activity for a minimum of 30 minutes discussed and encouraged, as a part of hypertension management. The importance of attaining a healthy weight is also discussed.     07/15/2022   11:13 AM 07/15/2022   10:42 AM 07/15/2022   10:41 AM 06/17/2022    9:16 AM 06/17/2022    9:07 AM 06/17/2022    8:51 AM 06/17/2022    8:49 AM  BP/Weight  Systolic BP 140 130 143 140 128 142 149  Diastolic BP 90 79 80 80 80 87 82  Wt. (Lbs)   136    138.04  BMI   24.09 kg/m2    24.45 kg/m2       Type 2 diabetes mellitus with other specified complication (HCC) Uncontrolled, nees to monitor more closely and comply with treatment plan [name] is reminded of the importance of commitment to daily physical activity for 30 minutes or more, as able and the need to limit carbohydrate intake to 30 to 60 grams per meal to help with blood sugar control.   The need to take medication as prescribed, test blood sugar as directed, and to call between visits if there is a concern that blood sugar is uncontrolled is also discussed.   [name] is reminded of the importance of daily foot exam, annual eye examination, and good blood sugar, blood pressure and cholesterol control.     Latest Ref Rng & Units 05/04/2022    3:48 PM 12/22/2021   10:44 AM 12/22/2021   10:37 AM 10/15/2021  10:29 AM 10/08/2021    6:25 PM  Diabetic Labs  HbA1c 4.8 - 5.6 % 8.2   7.7     Micro/Creat Ratio 0 - 29 mg/g creat  386      Chol 100 - 199 mg/dL   811     HDL >91 mg/dL   85     Calc LDL 0 - 99 mg/dL   86     Triglycerides 0 - 149 mg/dL   58     Creatinine 4.78 - 1.00 mg/dL 2.95   6.21  3.08  6.57       07/15/2022   11:13 AM 07/15/2022   10:42 AM 07/15/2022   10:41 AM 06/17/2022    9:16 AM 06/17/2022    9:07 AM 06/17/2022    8:51 AM 06/17/2022    8:49 AM  BP/Weight  Systolic BP 140 130 143 140 128 142 149  Diastolic BP 90 79 80 80 80 87 82  Wt. (Lbs)   136    138.04  BMI   24.09 kg/m2    24.45 kg/m2      Latest Ref Rng &  Units 08/21/2021    8:40 AM 03/05/2021   12:00 AM  Foot/eye exam completion dates  Eye Exam No Retinopathy  No Retinopathy      Foot Form Completion  Done      This result is from an external source.      Updated lab needed at/ before next visit.

## 2022-07-26 ENCOUNTER — Other Ambulatory Visit: Payer: Self-pay

## 2022-07-26 ENCOUNTER — Encounter (HOSPITAL_COMMUNITY): Payer: Self-pay

## 2022-07-26 ENCOUNTER — Encounter (HOSPITAL_COMMUNITY)
Admission: RE | Admit: 2022-07-26 | Discharge: 2022-07-26 | Disposition: A | Discharge status: Home / Self Care | Payer: 59 | Source: Ambulatory Visit | Attending: Gastroenterology | Admitting: Gastroenterology

## 2022-07-26 NOTE — Progress Notes (Signed)
PAT phone call completed. Pt states she is going today to get colon prep. Prep instructions placed at main entrance for pt to pick up due to not receiving letter in the mail.  Pt verbalized understanding of prep instructions and arrival time.

## 2022-07-27 ENCOUNTER — Telehealth (INDEPENDENT_AMBULATORY_CARE_PROVIDER_SITE_OTHER): Payer: Self-pay | Admitting: Gastroenterology

## 2022-07-27 NOTE — Telephone Encounter (Signed)
Pt left voicemail stating that she had eaten rice on Sunday and Monday. Has also taken iron tablets. Pt states she did not receive her instructions until yesterday. After further discussion with pt, she states "I did get something from you all but thought it was a bill".  Pt has been rescheduled for 08/26/22 at 1:00pm; will send updated instructions and let Endo know.

## 2022-08-03 ENCOUNTER — Other Ambulatory Visit: Payer: Self-pay | Admitting: Family Medicine

## 2022-08-06 ENCOUNTER — Other Ambulatory Visit: Payer: Self-pay | Admitting: Family Medicine

## 2022-08-12 ENCOUNTER — Other Ambulatory Visit: Payer: Self-pay | Admitting: Family Medicine

## 2022-08-25 ENCOUNTER — Encounter (INDEPENDENT_AMBULATORY_CARE_PROVIDER_SITE_OTHER): Payer: Self-pay

## 2022-08-25 ENCOUNTER — Telehealth (INDEPENDENT_AMBULATORY_CARE_PROVIDER_SITE_OTHER): Payer: Self-pay | Admitting: Gastroenterology

## 2022-08-25 NOTE — Telephone Encounter (Signed)
Pt returned call and has rescheduled to 09/17/22 at 1pm. Pt states she has forgotten things this month. Pt already has prep. Updated instructions sent to patient via mail.   Left message on sons phone just to discuss mom seeming really forgetful and confused on the phone.

## 2022-08-25 NOTE — Telephone Encounter (Signed)
Pt returned call and left message. Attempted to reach pt x2; no answer and voicemail is full.  Message sent to endo and pt taken off schedule for tomorrow

## 2022-08-25 NOTE — Telephone Encounter (Signed)
Pt left voicemail stating that she will need to reschedule her procedure. Pt on for TCS tomorrow 08/26/22.  Attempted to return call to pt but voicemail is full and unable to leave message.

## 2022-08-25 NOTE — Telephone Encounter (Signed)
Pt son returned call. I informed son that patient seem a little more confused and forgetful than the previous time I had spoken with her. Advised son that I just wanted to make someone aware. Son states he will call mom and talk with her.

## 2022-09-02 ENCOUNTER — Ambulatory Visit: Payer: 59 | Admitting: Family Medicine

## 2022-09-11 ENCOUNTER — Other Ambulatory Visit: Payer: Self-pay | Admitting: Family Medicine

## 2022-09-17 ENCOUNTER — Ambulatory Visit (HOSPITAL_COMMUNITY): Payer: 59 | Admitting: Anesthesiology

## 2022-09-17 ENCOUNTER — Ambulatory Visit (HOSPITAL_COMMUNITY)
Admission: RE | Admit: 2022-09-17 | Discharge: 2022-09-17 | Disposition: A | Discharge status: Home / Self Care | Payer: 59 | Attending: Gastroenterology | Admitting: Gastroenterology

## 2022-09-17 ENCOUNTER — Encounter (HOSPITAL_COMMUNITY): Payer: Self-pay | Admitting: Gastroenterology

## 2022-09-17 ENCOUNTER — Encounter (HOSPITAL_COMMUNITY)
Admission: RE | Disposition: A | Discharge status: Home / Self Care | Payer: Self-pay | Source: Home / Self Care | Attending: Gastroenterology

## 2022-09-17 ENCOUNTER — Ambulatory Visit (HOSPITAL_BASED_OUTPATIENT_CLINIC_OR_DEPARTMENT_OTHER): Payer: 59 | Admitting: Anesthesiology

## 2022-09-17 ENCOUNTER — Other Ambulatory Visit: Payer: Self-pay

## 2022-09-17 DIAGNOSIS — D123 Benign neoplasm of transverse colon: Secondary | ICD-10-CM | POA: Diagnosis not present

## 2022-09-17 DIAGNOSIS — Z8673 Personal history of transient ischemic attack (TIA), and cerebral infarction without residual deficits: Secondary | ICD-10-CM | POA: Diagnosis not present

## 2022-09-17 DIAGNOSIS — K648 Other hemorrhoids: Secondary | ICD-10-CM | POA: Insufficient documentation

## 2022-09-17 DIAGNOSIS — E119 Type 2 diabetes mellitus without complications: Secondary | ICD-10-CM | POA: Diagnosis not present

## 2022-09-17 DIAGNOSIS — N189 Chronic kidney disease, unspecified: Secondary | ICD-10-CM | POA: Diagnosis not present

## 2022-09-17 DIAGNOSIS — Z7984 Long term (current) use of oral hypoglycemic drugs: Secondary | ICD-10-CM | POA: Diagnosis not present

## 2022-09-17 DIAGNOSIS — E785 Hyperlipidemia, unspecified: Secondary | ICD-10-CM | POA: Insufficient documentation

## 2022-09-17 DIAGNOSIS — K5730 Diverticulosis of large intestine without perforation or abscess without bleeding: Secondary | ICD-10-CM | POA: Insufficient documentation

## 2022-09-17 DIAGNOSIS — D126 Benign neoplasm of colon, unspecified: Secondary | ICD-10-CM

## 2022-09-17 DIAGNOSIS — Z860100 Personal history of colon polyps, unspecified: Secondary | ICD-10-CM

## 2022-09-17 DIAGNOSIS — Z79899 Other long term (current) drug therapy: Secondary | ICD-10-CM | POA: Insufficient documentation

## 2022-09-17 DIAGNOSIS — D128 Benign neoplasm of rectum: Secondary | ICD-10-CM | POA: Diagnosis not present

## 2022-09-17 DIAGNOSIS — D122 Benign neoplasm of ascending colon: Secondary | ICD-10-CM | POA: Diagnosis not present

## 2022-09-17 DIAGNOSIS — D124 Benign neoplasm of descending colon: Secondary | ICD-10-CM | POA: Diagnosis not present

## 2022-09-17 DIAGNOSIS — E1122 Type 2 diabetes mellitus with diabetic chronic kidney disease: Secondary | ICD-10-CM | POA: Diagnosis not present

## 2022-09-17 DIAGNOSIS — Z1211 Encounter for screening for malignant neoplasm of colon: Secondary | ICD-10-CM | POA: Diagnosis not present

## 2022-09-17 DIAGNOSIS — Z794 Long term (current) use of insulin: Secondary | ICD-10-CM | POA: Diagnosis not present

## 2022-09-17 DIAGNOSIS — K635 Polyp of colon: Secondary | ICD-10-CM | POA: Diagnosis not present

## 2022-09-17 DIAGNOSIS — I10 Essential (primary) hypertension: Secondary | ICD-10-CM | POA: Diagnosis not present

## 2022-09-17 HISTORY — PX: COLONOSCOPY WITH PROPOFOL: SHX5780

## 2022-09-17 HISTORY — DX: Cardiac arrhythmia, unspecified: I49.9

## 2022-09-17 HISTORY — PX: POLYPECTOMY: SHX5525

## 2022-09-17 LAB — GLUCOSE, CAPILLARY: Glucose-Capillary: 90 mg/dL (ref 70–99)

## 2022-09-17 LAB — HM COLONOSCOPY

## 2022-09-17 SURGERY — COLONOSCOPY WITH PROPOFOL
Anesthesia: General

## 2022-09-17 MED ORDER — LACTATED RINGERS IV SOLN: INTRAVENOUS | Status: DC

## 2022-09-17 MED ORDER — PROPOFOL 500 MG/50ML IV EMUL
INTRAVENOUS | Status: DC | PRN
  Administered 2022-09-17: 150 ug/kg/min via INTRAVENOUS

## 2022-09-17 MED ORDER — PROPOFOL 10 MG/ML IV BOLUS
INTRAVENOUS | Status: DC | PRN
Start: 2022-09-17 — End: 2022-09-17
  Administered 2022-09-17: 60 mg via INTRAVENOUS

## 2022-09-17 NOTE — Transfer of Care (Signed)
Immediate Anesthesia Transfer of Care Note  Patient: [name]  Procedure(s) Performed: COLONOSCOPY WITH PROPOFOL POLYPECTOMY  Patient Location: Endoscopy Unit  Anesthesia Type:General  Level of Consciousness: awake, alert , and oriented  Airway & Oxygen Therapy: Patient Spontanous Breathing  Post-op Assessment: Report given to RN, Post -op Vital signs reviewed and stable, Patient moving all extremities X 4, and Patient able to stick tongue midline  Post vital signs: Reviewed and stable  Last Vitals:  Vitals Value Taken Time  BP 134/72 09/17/22 1418  Temp 36.4 C 09/17/22 1418  Pulse 86 09/17/22 1418  Resp 19 09/17/22 1418  SpO2 99 % 09/17/22 1418    Last Pain:  Vitals:   09/17/22 1418  TempSrc: Oral  PainSc: 0-No pain      Patients Stated Pain Goal: 8 (09/17/22 1235)  Complications: No notable events documented.

## 2022-09-17 NOTE — Op Note (Signed)
[facility] Patient Name: [name] Procedure Date: 09/17/2022 1:18 PM MRN: [id number] Date of Birth: [birth year] Attending MD: Katrinka Blazing , , [id number] CSN: [id number] Age: 72 Admit Type: Outpatient Procedure:                Colonoscopy Indications:              Surveillance: Personal history of adenomatous                            polyps on last colonoscopy 5 years ago Providers:                Katrinka Blazing, Nena Polio, RN, Pandora Leiter,                            Technician Referring MD:              Medicines:                Monitored Anesthesia Care Complications:            No immediate complications. Estimated Blood Loss:     Estimated blood loss: none. Procedure:                Pre-Anesthesia Assessment:                           - Prior to the procedure, a History and Physical                            was performed, and patient medications, allergies                            and sensitivities were reviewed. The patient's                            tolerance of previous anesthesia was reviewed.                           - The risks and benefits of the procedure and the                            sedation options and risks were discussed with the                            patient. All questions were answered and informed                            consent was obtained.                           - ASA Grade Assessment: II - A patient with mild                            systemic disease.                           After obtaining informed consent, the colonoscope  was passed under direct vision. Throughout the                            procedure, the patient's blood pressure, pulse, and                            oxygen saturations were monitored continuously. The                            [id number]-HQ190L ([id number]) scope was introduced through                            the anus and advanced to the the cecum, identified                             by appendiceal orifice and ileocecal valve. The                            colonoscopy was performed without difficulty. The                            patient tolerated the procedure well. The quality                            of the bowel preparation was adequate. Scope In: 1:35:23 PM Scope Out: 2:13:01 PM Scope Withdrawal Time: 0 hours 24 minutes 11 seconds  Total Procedure Duration: 0 hours 37 minutes 38 seconds  Findings:      The perianal and digital rectal examinations were normal.      Four sessile polyps were found in the transverse colon and ascending       colon. The polyps were 2 to 6 mm in size. These polyps were removed with       a cold snare. Resection and retrieval were complete.      Two sessile polyps were found in the transverse colon and ascending       colon. The polyps were 1 mm in size. These polyps were removed with a       cold biopsy forceps. Resection and retrieval were complete.      Three sessile polyps were found in the rectum and descending colon. The       polyps were 3 to 6 mm in size. These polyps were removed with a cold       snare. Resection and retrieval were complete.      Scattered medium-mouthed and small-mouthed diverticula were found in the       sigmoid colon, descending colon and ascending colon.      Non-bleeding internal hemorrhoids were found during retroflexion. The       hemorrhoids were small. Impression:               - Four 2 to 6 mm polyps in the transverse colon and                            in the ascending colon, removed with a cold snare.  Resected and retrieved.                           - Two 1 mm polyps in the transverse colon and in                            the ascending colon, removed with a cold biopsy                            forceps. Resected and retrieved.                           - Three 3 to 6 mm polyps in the rectum and in the                            descending  colon, removed with a cold snare.                            Resected and retrieved.                           - Diverticulosis in the sigmoid colon, in the                            descending colon and in the ascending colon.                           - Non-bleeding internal hemorrhoids. Moderate Sedation:      Per Anesthesia Care Recommendation:           - Discharge patient to home (ambulatory).                           - Resume previous diet.                           - Await pathology results.                           - Repeat colonoscopy for surveillance based on                            pathology results. Procedure Code(s):        --- Professional ---                           [phone], Colonoscopy, flexible; with removal of                            tumor(s), polyp(s), or other lesion(s) by snare                            technique                           45380, 59, Colonoscopy, flexible; with biopsy,  single or multiple Diagnosis Code(s):        --- Professional ---                           Z86.010, Personal history of colonic polyps                           D12.3, Benign neoplasm of transverse colon (hepatic                            flexure or splenic flexure)                           D12.2, Benign neoplasm of ascending colon                           D12.8, Benign neoplasm of rectum                           D12.4, Benign neoplasm of descending colon                           K64.8, Other hemorrhoids                           K57.30, Diverticulosis of large intestine without                            perforation or abscess without bleeding CPT copyright 2022 [employer]. All rights reserved. The codes documented in this report are preliminary and upon coder review may  be revised to meet current compliance requirements. Katrinka Blazing, MD Katrinka Blazing,  09/17/2022 2:20:13 PM This report has been signed  electronically. Number of Addenda: 0

## 2022-09-17 NOTE — Discharge Instructions (Signed)
You are being discharged to home.  Resume your previous diet.  We are waiting for your pathology results.  Your physician has recommended a repeat colonoscopy for surveillance based on pathology results.  

## 2022-09-17 NOTE — Anesthesia Postprocedure Evaluation (Signed)
Anesthesia Post Note  Patient: [name]  Procedure(s) Performed: COLONOSCOPY WITH PROPOFOL POLYPECTOMY  Patient location during evaluation: Phase II Anesthesia Type: General Level of consciousness: awake and alert and oriented Pain management: pain level controlled Vital Signs Assessment: post-procedure vital signs reviewed and stable Respiratory status: spontaneous breathing, nonlabored ventilation and respiratory function stable Cardiovascular status: blood pressure returned to baseline and stable Postop Assessment: no apparent nausea or vomiting Anesthetic complications: no  No notable events documented.   Last Vitals:  Vitals:   09/17/22 1235 09/17/22 1418  BP: (!) 151/71   Pulse: 93 86  Resp: 16 19  Temp: 37.1 C 36.4 C  SpO2: 95% 99%    Last Pain:  Vitals:   09/17/22 1418  TempSrc: Oral  PainSc: 0-No pain                 Demarqus Jocson C Asusena Sigley

## 2022-09-17 NOTE — Anesthesia Preprocedure Evaluation (Addendum)
Anesthesia Evaluation  Patient identified by MRN, date of birth, ID band Patient awake    Reviewed: Allergy & Precautions, H&P , NPO status , Patient's Chart, lab work & pertinent test results  Airway Mallampati: II  TM Distance: >3 FB Neck ROM: Full    Dental  (+) Missing, Dental Advisory Given   Pulmonary neg pulmonary ROS   Pulmonary exam normal breath sounds clear to auscultation       Cardiovascular hypertension, Pt. on medications Normal cardiovascular exam+ dysrhythmias  Rhythm:Regular Rate:Normal     Neuro/Psych  Headaches TIA negative psych ROS   GI/Hepatic negative GI ROS, Neg liver ROS,,,  Endo/Other  diabetes, Well Controlled, Type 2, Oral Hypoglycemic Agents    Renal/GU Renal InsufficiencyRenal disease  negative genitourinary   Musculoskeletal  (+) Arthritis , Osteoarthritis,    Abdominal   Peds negative pediatric ROS (+)  Hematology negative hematology ROS (+)   Anesthesia Other Findings   Reproductive/Obstetrics negative OB ROS                             Anesthesia Physical Anesthesia Plan  ASA: 2  Anesthesia Plan: General   Post-op Pain Management: Minimal or no pain anticipated   Induction: Intravenous  PONV Risk Score and Plan: 1 and Propofol infusion  Airway Management Planned: Natural Airway and Nasal Cannula  Additional Equipment:   Intra-op Plan:   Post-operative Plan:   Informed Consent: I have reviewed the patients History and Physical, chart, labs and discussed the procedure including the risks, benefits and alternatives for the proposed anesthesia with the patient or authorized representative who has indicated his/her understanding and acceptance.     Dental advisory given  Plan Discussed with: CRNA and Surgeon  Anesthesia Plan Comments:        Anesthesia Quick Evaluation

## 2022-09-17 NOTE — H&P (Signed)
[name] is an 72 y.o. female.   Chief Complaint: History of colonic polyps HPI: 71 y/o with past medical history of hyperlipidemia, TIA, diabetes, hypertension, coming for history of colonic polyps.  Last colonoscopy was performed in 2020, she had 6 polyps removed from the colon, pathology Showed all the polyps were tubular adenomas.the patient denies having any complaints such as melena, hematochezia, abdominal pain or distention, change in her bowel movement consistency or frequency, no changes in weight recently.  No family history of colorectal cancer.   Past Medical History:  Diagnosis Date   BACK PAIN, THORACIC REGION, RIGHT 01/27/2010   Qualifier: Diagnosis of  By: Lodema Hong MD, Gwendalyn Ege of foot 07/15/2017   Dysrhythmia    Essential hypertension    Headache(784.0)    Hematuria 02/24/2016   History of kidney stones    Hyperlipidemia    Irregular heart beats    Microscopic hematuria 02/24/2016   Neck pain on right side 01/23/2013   Piles (hemorrhoids) 06/22/2011   Renal calculus, right 05/31/2016   Shoulder pain, right 01/25/2014   SKIN TAG 06/13/2008   Qualifier: Diagnosis of  By: Lodema Hong MD, Margaret     TIA (transient ischemic attack) 06/19/2016   Type 2 diabetes mellitus (HCC)    Vaginitis     Past Surgical History:  Procedure Laterality Date   COLONOSCOPY N/A 10/26/2018   Procedure: COLONOSCOPY;  Surgeon: Malissa Hippo, MD;  Location: [facility];  Service: Endoscopy;  Laterality: N/A;  830-10:30am   cyst removed Right 1998   cyst- removed from right wrist   CYSTOSCOPY WITH RETROGRADE PYELOGRAM, URETEROSCOPY AND STENT PLACEMENT Right 05/19/2020   Procedure: CYSTOSCOPY WITH RETROGRADE PYELOGRAM, URETEROSCOPY AND STENT PLACEMENT;  Surgeon: Malen Gauze, MD;  Location: [facility];  Service: Urology;  Laterality: Right;   CYSTOSCOPY WITH RETROGRADE PYELOGRAM, URETEROSCOPY AND STENT PLACEMENT Right 06/26/2020   Procedure: CYSTOSCOPY WITH RETROGRADE  PYELOGRAM, URETEROSCOPY AND STENT EXCHANGE;  Surgeon: Malen Gauze, MD;  Location: [facility];  Service: Urology;  Laterality: Right;   EXTRACORPOREAL SHOCK WAVE LITHOTRIPSY Right 05/29/2018   Procedure: EXTRACORPOREAL SHOCK WAVE LITHOTRIPSY (ESWL);  Surgeon: Ihor Gully, MD;  Location: [facility];  Service: Urology;  Laterality: Right;   HOLMIUM LASER APPLICATION Right 06/26/2020   Procedure: HOLMIUM LASER APPLICATION;  Surgeon: Malen Gauze, MD;  Location: [facility];  Service: Urology;  Laterality: Right;   lithotrpsy     over 20 years   NEPHROLITHOTOMY Right 05/31/2016   Procedure: NEPHROLITHOTOMY PERCUTANEOUS WITH SURGEON ACCESS;  Surgeon: Malen Gauze, MD;  Location: [facility];  Service: Urology;  Laterality: Right;   POLYPECTOMY  10/26/2018   Procedure: POLYPECTOMY;  Surgeon: Malissa Hippo, MD;  Location: [facility];  Service: Endoscopy;;  colon   TOTAL ABDOMINAL HYSTERECTOMY W/ BILATERAL SALPINGOOPHORECTOMY  1998   TUBAL LIGATION      Family History  Problem Relation Age of Onset   Alcohol abuse Mother    Esophageal cancer Father    Alcohol abuse Father    Diabetes Sister    Diabetes Sister    Hypertension Sister    Heart attack Sister        in 40's   Seizures Brother    Social History:  reports that she has never smoked. She has never used smokeless tobacco. She reports that she does not drink alcohol and does not use drugs.  Allergies:  Allergies  Allergen Reactions   Onglyza [Saxagliptin Hydrochloride] Swelling  Tongue swell   Pravastatin Other (See Comments)    Muscle aches   Ace Inhibitors Cough    Medications Prior to Admission  Medication Sig Dispense Refill   acetaminophen (TYLENOL) 500 MG tablet Take 1,000 mg by mouth every 6 (six) hours as needed for headache or moderate pain.     aspirin EC 81 MG tablet Take 1 tablet (81 mg total) by mouth daily.     atorvastatin (LIPITOR) 10 MG tablet TAKE ONE TABLET BY MOUTH EVERY MON, WED, AND FRI, AND HALF  A TABLET TUES, THURS, SAT, AND SUN 60 tablet 3   Cholecalciferol (VITAMIN D3) 50 MCG (2000 UT) TABS Take 2,000 Units by mouth daily.     ferrous sulfate 325 (65 FE) MG EC tablet TAKE 1 TABLET BY MOUTH TWICE A DAY WITH MEALS 180 tablet 1   insulin glargine (LANTUS SOLOSTAR) 100 UNIT/ML Solostar Pen INJECT 22 UNITS INTO THE SKIN DAILY AT 10 PM. (Patient taking differently: Inject 30 Units into the skin at bedtime.) 15 mL 5   losartan (COZAAR) 50 MG tablet TAKE 1 TABLET BY MOUTH EVERY DAY 90 tablet 1   meclizine (ANTIVERT) 12.5 MG tablet Take 1 tablet (12.5 mg total) by mouth 3 (three) times daily as needed for dizziness. 30 tablet 0   metFORMIN (GLUCOPHAGE) 1000 MG tablet TAKE 1 TABLET (1,000 MG TOTAL) BY MOUTH TWICE A DAY WITH FOOD 180 tablet 1   mirabegron ER (MYRBETRIQ) 25 MG TB24 tablet Take 1 tablet (25 mg total) by mouth daily. 30 tablet 5   B-D ULTRAFINE III SHORT PEN 31G X 8 MM MISC FOR USE WITH INSULIN ONCE DAILY DX E11.9 100 each 5   losartan (COZAAR) 25 MG tablet Take 1 tablet (25 mg total) by mouth daily. (Patient taking differently: Take 25 mg by mouth daily as needed (high blood pressure).) 30 tablet 3   OneTouch Delica Lancets 33G MISC Once daily dx e11.9 (give lancects that go with pts meter) 100 each 5   ONETOUCH ULTRA test strip USE AS INSTRUCTED 100 strip 5    Results for orders placed or performed during the hospital encounter of 09/17/22 (from the past 48 hour(s))  Glucose, capillary     Status: None   Collection Time: 09/17/22 12:27 PM  Result Value Ref Range   Glucose-Capillary 90 70 - 99 mg/dL    Comment: Glucose reference range applies only to samples taken after fasting for at least 8 hours.   No results found.  Review of Systems  All other systems reviewed and are negative.   Blood pressure (!) 151/71, pulse 93, temperature 98.8 F (37.1 C), temperature source Oral, resp. rate 16, height 5\' 3"  (1.6 m), weight 61.7 kg, SpO2 95%. Physical Exam  GENERAL: The  patient is AO x3, in no acute distress. HEENT: Head is normocephalic and atraumatic. EOMI are intact. Mouth is well hydrated and without lesions. NECK: Supple. No masses LUNGS: Clear to auscultation. No presence of rhonchi/wheezing/rales. Adequate chest expansion HEART: RRR, normal s1 and s2. ABDOMEN: Soft, nontender, no guarding, no peritoneal signs, and nondistended. BS +. No masses. EXTREMITIES: Without any cyanosis, clubbing, rash, lesions or edema. NEUROLOGIC: AOx3, no focal motor deficit. SKIN: no jaundice, no rashes  Assessment/Plan 72 y/o with past medical history of hyperlipidemia, TIA, diabetes, hypertension, coming for history of colonic polyps.  Will proceed with colonoscopy.  Dolores Frame, MD 09/17/2022, 1:28 PM

## 2022-09-20 ENCOUNTER — Encounter (INDEPENDENT_AMBULATORY_CARE_PROVIDER_SITE_OTHER): Payer: Self-pay | Admitting: *Deleted

## 2022-09-20 LAB — SURGICAL PATHOLOGY

## 2022-09-22 ENCOUNTER — Encounter (HOSPITAL_COMMUNITY): Payer: Self-pay | Admitting: Gastroenterology

## 2022-09-25 ENCOUNTER — Emergency Department (HOSPITAL_COMMUNITY)
Admission: EM | Admit: 2022-09-25 | Discharge: 2022-09-25 | Disposition: A | Discharge status: Home / Self Care | Payer: 59 | Attending: Student | Admitting: Student

## 2022-09-25 ENCOUNTER — Emergency Department (HOSPITAL_COMMUNITY): Payer: 59

## 2022-09-25 ENCOUNTER — Encounter (HOSPITAL_COMMUNITY): Payer: Self-pay | Admitting: Emergency Medicine

## 2022-09-25 ENCOUNTER — Other Ambulatory Visit: Payer: Self-pay

## 2022-09-25 DIAGNOSIS — E1165 Type 2 diabetes mellitus with hyperglycemia: Secondary | ICD-10-CM | POA: Insufficient documentation

## 2022-09-25 DIAGNOSIS — Z794 Long term (current) use of insulin: Secondary | ICD-10-CM | POA: Diagnosis not present

## 2022-09-25 DIAGNOSIS — E162 Hypoglycemia, unspecified: Secondary | ICD-10-CM

## 2022-09-25 DIAGNOSIS — W1830XA Fall on same level, unspecified, initial encounter: Secondary | ICD-10-CM | POA: Insufficient documentation

## 2022-09-25 DIAGNOSIS — Z7984 Long term (current) use of oral hypoglycemic drugs: Secondary | ICD-10-CM | POA: Diagnosis not present

## 2022-09-25 DIAGNOSIS — W19XXXA Unspecified fall, initial encounter: Secondary | ICD-10-CM

## 2022-09-25 DIAGNOSIS — Z79899 Other long term (current) drug therapy: Secondary | ICD-10-CM | POA: Insufficient documentation

## 2022-09-25 DIAGNOSIS — I6782 Cerebral ischemia: Secondary | ICD-10-CM | POA: Diagnosis not present

## 2022-09-25 DIAGNOSIS — R55 Syncope and collapse: Secondary | ICD-10-CM | POA: Diagnosis not present

## 2022-09-25 DIAGNOSIS — I10 Essential (primary) hypertension: Secondary | ICD-10-CM | POA: Diagnosis not present

## 2022-09-25 DIAGNOSIS — E876 Hypokalemia: Secondary | ICD-10-CM | POA: Insufficient documentation

## 2022-09-25 DIAGNOSIS — S0083XA Contusion of other part of head, initial encounter: Secondary | ICD-10-CM | POA: Diagnosis not present

## 2022-09-25 DIAGNOSIS — E11649 Type 2 diabetes mellitus with hypoglycemia without coma: Secondary | ICD-10-CM | POA: Diagnosis not present

## 2022-09-25 DIAGNOSIS — S0990XA Unspecified injury of head, initial encounter: Secondary | ICD-10-CM | POA: Diagnosis present

## 2022-09-25 DIAGNOSIS — Z7982 Long term (current) use of aspirin: Secondary | ICD-10-CM | POA: Insufficient documentation

## 2022-09-25 DIAGNOSIS — R220 Localized swelling, mass and lump, head: Secondary | ICD-10-CM | POA: Diagnosis not present

## 2022-09-25 LAB — CBC WITH DIFFERENTIAL/PLATELET
Abs Immature Granulocytes: 0.02 10*3/uL (ref 0.00–0.07)
Basophils Absolute: 0 10*3/uL (ref 0.0–0.1)
Basophils Relative: 0 %
Eosinophils Absolute: 0 10*3/uL (ref 0.0–0.5)
Eosinophils Relative: 1 %
HCT: 41.1 % (ref 36.0–46.0)
Hemoglobin: 12.8 g/dL (ref 12.0–15.0)
Immature Granulocytes: 0 %
Lymphocytes Relative: 14 %
Lymphs Abs: 0.8 10*3/uL (ref 0.7–4.0)
MCH: 29.1 pg (ref 26.0–34.0)
MCHC: 31.1 g/dL (ref 30.0–36.0)
MCV: 93.4 fL (ref 80.0–100.0)
Monocytes Absolute: 0.3 10*3/uL (ref 0.1–1.0)
Monocytes Relative: 6 %
Neutro Abs: 4.5 10*3/uL (ref 1.7–7.7)
Neutrophils Relative %: 79 %
Platelets: 357 10*3/uL (ref 150–400)
RBC: 4.4 MIL/uL (ref 3.87–5.11)
RDW: 13.4 % (ref 11.5–15.5)
WBC: 5.6 10*3/uL (ref 4.0–10.5)
nRBC: 0 % (ref 0.0–0.2)

## 2022-09-25 LAB — URINALYSIS, W/ REFLEX TO CULTURE (INFECTION SUSPECTED)
Bacteria, UA: NONE SEEN
Bilirubin Urine: NEGATIVE
Glucose, UA: NEGATIVE mg/dL
Ketones, ur: NEGATIVE mg/dL
Nitrite: NEGATIVE
Protein, ur: 100 mg/dL — AB
Specific Gravity, Urine: 1.012 (ref 1.005–1.030)
WBC, UA: >50 WBC/hpf (ref 0–5)
pH: 7 (ref 5.0–8.0)

## 2022-09-25 LAB — CBG MONITORING, ED
Glucose-Capillary: 154 mg/dL — ABNORMAL HIGH (ref 70–99)
Glucose-Capillary: 38 mg/dL — CL (ref 70–99)
Glucose-Capillary: 262 mg/dL — ABNORMAL HIGH (ref 70–99)
Glucose-Capillary: 266 mg/dL — ABNORMAL HIGH (ref 70–99)

## 2022-09-25 LAB — BASIC METABOLIC PANEL
Anion gap: 11 (ref 5–15)
BUN: 15 mg/dL (ref 8–23)
CO2: 27 mmol/L (ref 22–32)
Calcium: 9 mg/dL (ref 8.9–10.3)
Chloride: 103 mmol/L (ref 98–111)
Creatinine, Ser: 0.64 mg/dL (ref 0.44–1.00)
GFR, Estimated: >60 mL/min (ref >60)
Glucose, Bld: 48 mg/dL — ABNORMAL LOW (ref 70–99)
Potassium: 3.1 mmol/L — ABNORMAL LOW (ref 3.5–5.1)
Sodium: 141 mmol/L (ref 135–145)

## 2022-09-25 MED ORDER — DEXTROSE 50 % IV SOLN
1.0000 | Freq: Once | INTRAVENOUS | Status: AC
  Administered 2022-09-25: 50 mL via INTRAVENOUS
  Filled 2022-09-25: qty 50

## 2022-09-25 MED ORDER — POTASSIUM CHLORIDE CRYS ER 20 MEQ PO TBCR
40.0000 meq | EXTENDED_RELEASE_TABLET | Freq: Once | ORAL | Status: AC
  Administered 2022-09-25: 40 meq via ORAL
  Filled 2022-09-25: qty 2

## 2022-09-25 NOTE — ED Provider Notes (Signed)
Patient signed out to me at shift change from [facility] - she fell at home today with head injury, no LOC,  CT imaging negative for intracranial injury. She was found to be hypoglycemic here.  She had taken her evening metformin and Lantus around midnight last night but had no p.o. intake today prior to arrival.  Her glucose was 48 on her be met.  Patient was given D50 and also given a snack and ginger ale.  After which she had multiple CBG checks and they are now in the mid 250 range.  We discussed her need to have a real meal, although her blood glucose is elevated now my concern is the carbs she consumed here will not cover her overnight, she will stop for food on the way home, she is with her sister who ensure she will make this happen.      Plan follow-up with her PCP as needed, she does have an appointment in 11 days with Dr. Lodema Hong.  She was given head injury instructions and advised close follow-up either here or with Dr. Lodema Hong for any new or worsening symptoms.   Burgess Amor, Cordelia Poche 09/25/22 2137    Glendora Score, MD 09/26/22 1155

## 2022-09-25 NOTE — ED Triage Notes (Signed)
Pt reports fall that happened this morning when standing after getting out of bed. PT reports hitting her head. Denies blood thinner use.

## 2022-09-25 NOTE — ED Provider Notes (Signed)
[place] EMERGENCY DEPARTMENT AT [facility] Provider Note   CSN: [id number] Arrival date & time: 09/25/22  1638     History  Chief Complaint  Patient presents with   Marletta Lor    [name] is a 72 y.o. female.  With a history of hypertension, type 2 diabetes, hyperlipidemia who presents to the ED for evaluation of a fall.  She states she was lying flat in her recliner sleeping at approximately 3 PM today.  She got up quickly to go to the bathroom and states she started to feel dizzy and lightheaded.  She states she was unable to grab onto anything nearby and she fell forward, striking her head on a table.  She did not lose consciousness.  She states she has a history of similar symptoms and typically changes positions slowly due to this.  She takes a baby aspirin daily but does not take any anticoagulants.  She reports a hematoma to the left forehead which is painful.  Also reports a mild headache.  Denies any vision changes.  No numbness, weakness or tingling.  No prodromal chest pain or shortness of breath, abdominal pain.  She last took her metformin and Lantus at midnight last night.  She states she has not eaten or drink much today.   Fall Associated symptoms include headaches.       Home Medications Prior to Admission medications   Medication Sig Start Date End Date Taking? Authorizing Provider  acetaminophen (TYLENOL) 500 MG tablet Take 1,000 mg by mouth every 6 (six) hours as needed for headache or moderate pain.    [provider]  aspirin EC 81 MG tablet Take 1 tablet (81 mg total) by mouth daily. 11/02/18   Rehman, Joline Maxcy, MD  atorvastatin (LIPITOR) 10 MG tablet TAKE ONE TABLET BY MOUTH EVERY MON, WED, AND FRI, AND HALF A TABLET TUES, THURS, SAT, AND SUN 01/14/22   Kerri Perches, MD  B-D ULTRAFINE III SHORT PEN 31G X 8 MM MISC FOR USE WITH INSULIN ONCE DAILY DX E11.9 09/01/21   Kerri Perches, MD  Cholecalciferol (VITAMIN D3) 50 MCG (2000  UT) TABS Take 2,000 Units by mouth daily.    [provider]  ferrous sulfate 325 (65 FE) MG EC tablet TAKE 1 TABLET BY MOUTH TWICE A DAY WITH MEALS 08/09/22   Kerri Perches, MD  insulin glargine (LANTUS SOLOSTAR) 100 UNIT/ML Solostar Pen INJECT 22 UNITS INTO THE SKIN DAILY AT 10 PM. Patient taking differently: Inject 30 Units into the skin at bedtime. 08/03/22   Kerri Perches, MD  losartan (COZAAR) 25 MG tablet Take 1 tablet (25 mg total) by mouth daily. Patient taking differently: Take 25 mg by mouth daily as needed (high blood pressure). 07/15/22   Kerri Perches, MD  losartan (COZAAR) 50 MG tablet TAKE 1 TABLET BY MOUTH EVERY DAY 09/13/22   Kerri Perches, MD  meclizine (ANTIVERT) 12.5 MG tablet Take 1 tablet (12.5 mg total) by mouth 3 (three) times daily as needed for dizziness. 06/17/22   Kerri Perches, MD  metFORMIN (GLUCOPHAGE) 1000 MG tablet TAKE 1 TABLET (1,000 MG TOTAL) BY MOUTH TWICE A DAY WITH FOOD 08/12/22   Kerri Perches, MD  mirabegron ER (MYRBETRIQ) 25 MG TB24 tablet Take 1 tablet (25 mg total) by mouth daily. 05/03/22   McKenzie, Mardene Celeste, MD  OneTouch Delica Lancets 33G MISC Once daily dx e11.9 (give lancects that go with pts meter) 12/06/18  Kerri Perches, MD  [facility] ULTRA test strip USE AS INSTRUCTED 06/18/22   Kerri Perches, MD      Allergies    Onglyza [saxagliptin hydrochloride], Pravastatin, and Ace inhibitors    Review of Systems   Review of Systems  Neurological:  Positive for light-headedness and headaches.  All other systems reviewed and are negative.   Physical Exam Updated Vital Signs BP (!) 176/92 (BP Location: Right Arm)   Pulse 93   Temp 97.9 F (36.6 C) (Oral)   Resp 18   Ht 5\' 3"  (1.6 m)   Wt 62.1 kg   SpO2 97%   BMI 24.27 kg/m  Physical Exam Vitals and nursing note reviewed.  Constitutional:      General: She is not in acute distress.    Appearance: Normal appearance. She is well-developed. She  is not ill-appearing, toxic-appearing or diaphoretic.     Comments: Resting comfortably in bed  HENT:     Head: Normocephalic.     Comments: Hematoma to left forehead Eyes:     Conjunctiva/sclera: Conjunctivae normal.  Cardiovascular:     Rate and Rhythm: Normal rate and regular rhythm.     Heart sounds: No murmur heard. Pulmonary:     Effort: Pulmonary effort is normal. No respiratory distress.     Breath sounds: Normal breath sounds.  Abdominal:     Palpations: Abdomen is soft.     Tenderness: There is no abdominal tenderness.  Musculoskeletal:        General: No swelling.     Cervical back: Neck supple.  Skin:    General: Skin is warm and dry.     Capillary Refill: Capillary refill takes less than 2 seconds.  Neurological:     General: No focal deficit present.     Mental Status: She is alert and oriented to person, place, and time.     Comments:   MENTAL STATUS: AAOx3   LANG/SPEECH: Fluent, intact naming, repetition & comprehension   CRANIAL NERVES:   II: Pupils equal and reactive   III, IV, VI: EOM intact, no gaze preference or deviation, no nystagmus   V: normal sensation of the face   VII: no facial asymmetry   VIII: normal hearing to speech   MOTOR: 5/5 in both upper and lower extremities   SENSORY: Normal to touch in all extremiteis   COORD: Normal finger to nose, heel to shin and shoulder shrug, no dysmetria. No pronator drift. She is tremulous  Psychiatric:        Mood and Affect: Mood normal.     ED Results / Procedures / Treatments   Labs (all labs ordered are listed, but only abnormal results are displayed) Labs Reviewed  BASIC METABOLIC PANEL - Abnormal; Notable for the following components:      Result Value   Potassium 3.1 (*)    Glucose, Bld 48 (*)    All other components within normal limits  CBG MONITORING, ED - Abnormal; Notable for the following components:   Glucose-Capillary 38 (*)    All other components within normal limits  CBC WITH  DIFFERENTIAL/PLATELET  URINALYSIS, W/ REFLEX TO CULTURE (INFECTION SUSPECTED)  CBG MONITORING, ED    EKG None  Radiology CT Head Wo Contrast  Result Date: 09/25/2022 CLINICAL DATA:  Recent fall with left-sided periorbital swelling, initial encounter EXAM: CT HEAD WITHOUT CONTRAST CT CERVICAL SPINE WITHOUT CONTRAST TECHNIQUE: Multidetector CT imaging of the head and cervical spine was performed following the standard protocol  without intravenous contrast. Multiplanar CT image reconstructions of the cervical spine were also generated. RADIATION DOSE REDUCTION: This exam was performed according to the departmental dose-optimization program which includes automated exposure control, adjustment of the mA and/or kV according to patient size and/or use of iterative reconstruction technique. COMPARISON:  None Available. FINDINGS: CT HEAD FINDINGS Brain: No evidence of acute infarction, hemorrhage, hydrocephalus, extra-axial collection or mass lesion/mass effect. Areas of prior ischemia are noted in the thalami bilaterally. Mild chronic white matter ischemic changes are seen. Vascular: No hyperdense vessel or unexpected calcification. Skull: Normal. Negative for fracture or focal lesion. Sinuses/Orbits: No acute finding. Other: Left supraorbital scalp hematoma is noted consistent with the recent injury. CT CERVICAL SPINE FINDINGS Alignment: Loss of the normal cervical lordosis is noted. Skull base and vertebrae: None cervical segments are well visualized. Multilevel facet hypertrophic changes and osteophytic changes are seen. No acute fracture or acute facet abnormality is noted. The odontoid is within normal limits. Soft tissues and spinal canal: Surrounding soft tissue structures are within normal limits. Upper chest: Visualized lung apices are unremarkable. Other: None IMPRESSION: CT of the head: Chronic white matter ischemic changes. Left supraorbital hematoma. CT of the cervical spine: Multilevel degenerative  change without acute bony abnormality. Electronically Signed   By: Alcide Clever M.D.   On: 09/25/2022 18:12   CT Cervical Spine Wo Contrast  Result Date: 09/25/2022 CLINICAL DATA:  Recent fall with left-sided periorbital swelling, initial encounter EXAM: CT HEAD WITHOUT CONTRAST CT CERVICAL SPINE WITHOUT CONTRAST TECHNIQUE: Multidetector CT imaging of the head and cervical spine was performed following the standard protocol without intravenous contrast. Multiplanar CT image reconstructions of the cervical spine were also generated. RADIATION DOSE REDUCTION: This exam was performed according to the departmental dose-optimization program which includes automated exposure control, adjustment of the mA and/or kV according to patient size and/or use of iterative reconstruction technique. COMPARISON:  None Available. FINDINGS: CT HEAD FINDINGS Brain: No evidence of acute infarction, hemorrhage, hydrocephalus, extra-axial collection or mass lesion/mass effect. Areas of prior ischemia are noted in the thalami bilaterally. Mild chronic white matter ischemic changes are seen. Vascular: No hyperdense vessel or unexpected calcification. Skull: Normal. Negative for fracture or focal lesion. Sinuses/Orbits: No acute finding. Other: Left supraorbital scalp hematoma is noted consistent with the recent injury. CT CERVICAL SPINE FINDINGS Alignment: Loss of the normal cervical lordosis is noted. Skull base and vertebrae: None cervical segments are well visualized. Multilevel facet hypertrophic changes and osteophytic changes are seen. No acute fracture or acute facet abnormality is noted. The odontoid is within normal limits. Soft tissues and spinal canal: Surrounding soft tissue structures are within normal limits. Upper chest: Visualized lung apices are unremarkable. Other: None IMPRESSION: CT of the head: Chronic white matter ischemic changes. Left supraorbital hematoma. CT of the cervical spine: Multilevel degenerative change  without acute bony abnormality. Electronically Signed   By: Alcide Clever M.D.   On: 09/25/2022 18:12    Procedures Procedures    Medications Ordered in ED Medications  potassium chloride SA (KLOR-CON M) CR tablet 40 mEq (has no administration in time range)  dextrose 50 % solution 50 mL (has no administration in time range)    ED Course/ Medical Decision Making/ A&P                             Medical Decision Making Amount and/or Complexity of Data Reviewed Labs: ordered. Radiology: ordered.  This patient presents  to the ED for concern of fall, head injury, this involves an extensive number of treatment options, and is a complaint that carries with it a high risk of complications and morbidity.  Fracture, contusion, concussion, intracranial bleed.   The differential for syncope/near syncope is extensive and includes, but is not limited to: arrythmia (Vtach, SVT, SSS, sinus arrest, AV block, bradycardia) aortic stenosis, AMI, HOCM, PE, atrial myxoma, pulmonary hypertension, orthostatic hypotension, (hypovolemia, drug effect, GB syndrome, micturition, cough, swall) carotid sinus sensitivity, Seizure, TIA/CVA, hypoglycemia,  Vertigo.   Co morbidities that complicate the patient evaluation  hypertension, type 2 diabetes, hyperlipidemia  My initial workup includes basic labs, imaging.  Patient declines pain medication  Additional history obtained from: Nursing notes from this visit.  I ordered, reviewed and interpreted labs which include: BMP, CBC, urinalysis.  Hyperglycemia 48.  Hypokalemia of 3.1.  I ordered imaging studies including CT head, C-spine I independently visualized and interpreted imaging which showed no acute findings on CT C-spine.  Left supraorbital hematoma, no other acute findings on CT head I agree with the radiologist interpretation  Cardiac Monitoring:  The patient was maintained on a cardiac monitor.  I personally viewed and interpreted the cardiac  monitored which showed an underlying rhythm of: NSR  Afebrile, hemodynamically stable.  72 year old female presenting to the ED for evaluation of a fall.  This occurred after patient went from lying to standing too quickly.  She has a history of similar.  She denies any prodromal symptoms of chest pain or shortness of breath.  Her main concern today is the hematoma to her left forehead.  She is not anticoagulated.  She appears otherwise very well on physical exam.  Lab workup significant for hypokalemia and hypoglycemia.  Hypoglycemia may have been a contributing factor to her symptoms as well.  She was given graham crackers and juice in the emergency department as well as an amp of D 50.  Orthostatic vitals are reassuring today.  Overall do suspect a vasovagal cause of her near syncope and fall. Plan at the time of shift change is to replenish her potassium and increase her blood sugar. If her blood sugar is at an appropriate level and her tremor improves, she will likely be safe for discharge home. Care will be handed off to oncoming provider pending above interventions. Plan may change at the discretion of the oncoming provider. Please see subsequent note for final disposition and decision making.  Patient's case discussed with Dr. Posey Rea.   Note: Portions of this report may have been transcribed using voice recognition software. Every effort was made to ensure accuracy; however, inadvertent computerized transcription errors may still be present.        Final Clinical Impression(s) / ED Diagnoses Final diagnoses:  Fall, initial encounter  Contusion of face, initial encounter    Rx / DC Orders ED Discharge Orders     None         Mora Bellman 09/25/22 1900    Glendora Score, MD 09/26/22 1155

## 2022-09-25 NOTE — Discharge Instructions (Addendum)
You have been seen today for your complaint of fall, scalp contusion. Your lab work  was reassuring. Your imaging was reassuring. Your discharge medications include Alternate tylenol and ibuprofen for pain. You may alternate these every 4 hours. You may take up to 600 mg of ibuprofen at a time and up to 650 mg of tylenol. Home care instructions are as follows:  Change positions slowly.  Use an assistive walking device Follow up with: Your primary care provider in 1 week for reevaluation Please seek immediate medical care if you develop any of the following symptoms: Lose consciousness or have trouble moving after a fall. Have a fall that causes a head injury. At this time there does not appear to be the presence of an emergent medical condition, however there is always the potential for conditions to change. Please read and follow the below instructions.  Do not take your medicine if  develop an itchy rash, swelling in your mouth or lips, or difficulty breathing; call 911 and seek immediate emergency medical attention if this occurs.  You may review your lab tests and imaging results in their entirety on your MyChart account.  Please discuss all results of fully with your primary care provider and other specialist at your follow-up visit.  Note: Portions of this text may have been transcribed using voice recognition software. Every effort was made to ensure accuracy; however, inadvertent computerized transcription errors may still be present.   Make sure to avoid long periods between eating to avoid hypoglycemia episodes.

## 2022-09-25 NOTE — ED Notes (Signed)
Patient stated she did take her BP Meds this Am.

## 2022-09-25 NOTE — ED Notes (Signed)
Pt given orange juice, ginger ale,peanut butter, and graham crackers due to low blood sugar.

## 2022-09-27 ENCOUNTER — Telehealth: Payer: Self-pay | Admitting: *Deleted

## 2022-09-27 LAB — URINE CULTURE

## 2022-09-27 NOTE — Telephone Encounter (Signed)
Transition Care Management Follow-up Telephone Call Date of discharge and from where: [name] 09/25/2022 How have you been since you were released from the hospital? Feeling better  Any questions or concerns? Yes  Items Reviewed: Did the pt receive and understand the discharge instructions provided? Yes  Medications obtained and verified? Yes  Other? No  Any new allergies since your discharge? No  Dietary orders reviewed? No Do you have support at home? No      Follow up appointments reviewed:  PCP Hospital f/u appt confirmed? Yes  , but not scheduled   If their condition worsens, is the pt aware to call PCP or go to the Emergency Dept.? Yes Was the patient provided with contact information for the PCP's office or ED? Yes Was to pt encouraged to call back with questions or concerns? Yes

## 2022-09-29 ENCOUNTER — Telehealth: Payer: Self-pay | Admitting: Family Medicine

## 2022-09-29 ENCOUNTER — Ambulatory Visit (INDEPENDENT_AMBULATORY_CARE_PROVIDER_SITE_OTHER): Payer: 59 | Admitting: Family Medicine

## 2022-09-29 ENCOUNTER — Encounter: Payer: Self-pay | Admitting: Family Medicine

## 2022-09-29 VITALS — BP 153/91 | HR 104 | Ht 63.0 in | Wt 133.0 lb

## 2022-09-29 DIAGNOSIS — E1169 Type 2 diabetes mellitus with other specified complication: Secondary | ICD-10-CM | POA: Diagnosis not present

## 2022-09-29 DIAGNOSIS — E162 Hypoglycemia, unspecified: Secondary | ICD-10-CM | POA: Diagnosis not present

## 2022-09-29 DIAGNOSIS — Z09 Encounter for follow-up examination after completed treatment for conditions other than malignant neoplasm: Secondary | ICD-10-CM | POA: Diagnosis not present

## 2022-09-29 DIAGNOSIS — Z794 Long term (current) use of insulin: Secondary | ICD-10-CM

## 2022-09-29 DIAGNOSIS — I10 Essential (primary) hypertension: Secondary | ICD-10-CM | POA: Diagnosis not present

## 2022-09-29 DIAGNOSIS — E782 Mixed hyperlipidemia: Secondary | ICD-10-CM

## 2022-09-29 DIAGNOSIS — W19XXXD Unspecified fall, subsequent encounter: Secondary | ICD-10-CM | POA: Diagnosis not present

## 2022-09-29 LAB — POCT GLYCOSYLATED HEMOGLOBIN (HGB A1C): HbA1c, POC (controlled diabetic range): 7.1 % — AB (ref 0.0–7.0)

## 2022-09-29 MED ORDER — BLOOD GLUCOSE TEST VI STRP
1.0000 | ORAL_STRIP | Freq: Three times a day (TID) | 0 refills | Status: DC
Start: 2022-09-29 — End: 2022-10-26

## 2022-09-29 MED ORDER — LANTUS SOLOSTAR 100 UNIT/ML ~~LOC~~ SOPN: 30.0000 [IU] | PEN_INJECTOR | Freq: Every day | SUBCUTANEOUS | 5 refills | Status: DC

## 2022-09-29 MED ORDER — LOSARTAN POTASSIUM 50 MG PO TABS: 50.0000 mg | ORAL_TABLET | Freq: Every day | ORAL | 1 refills | Status: DC

## 2022-09-29 MED ORDER — LANCET DEVICE MISC
1.0000 | Freq: Three times a day (TID) | 0 refills | Status: AC
Start: 2022-09-29 — End: 2022-10-29

## 2022-09-29 MED ORDER — LANCETS MISC. MISC
1.0000 | Freq: Three times a day (TID) | 0 refills | Status: AC
Start: 2022-09-29 — End: 2022-10-29

## 2022-09-29 MED ORDER — BLOOD GLUCOSE MONITORING SUPPL DEVI
1.0000 | Freq: Three times a day (TID) | 0 refills | Status: DC
Start: 2022-09-29 — End: 2022-10-15

## 2022-09-29 NOTE — Patient Instructions (Addendum)
Pls reschedule   f/u to 4 to 5 weeks, call if you need me sooner  Fasting lipid, cmp and EGFr and tSH 3 to 5 days before next appt Blood sugar improved  Need to eat regularly   Nurse please order continuous blood glucose monitoring system that the patient can use, and explained that she will need education as to how to use it which she can get either at the pharmacy or in the office.  Important to test blood sugar and record 3 times daily before breakfast 2 hours after lunch and at bedtime.  Blood sugar before breakfast should be between 80-1 30.  Blood sugar 2 hours after lunch should be between 1 30-1 80.  Bedtime blood sugar should be between 1 30-1 80.  If your blood sugar at bedtime is less than 130 you need a snack like 1 or 2 peanut butter crackers.  For blood pressure take losartan 50 mg 1 tablet once daily.  For blood sugar take Lantus 30 units daily and continue metformin 1000 mg twice daily.  Thanks for choosing [facility], we consider it a privelige to serve you.

## 2022-09-29 NOTE — Progress Notes (Unsigned)
   [name]     MRN: [id number]      DOB: [birth year]  Chief Complaint  Patient presents with   Follow-up    Er follow up    HPI [name] is here for follow up and re-evaluation of chronic medical conditions, medication management and review of any available recent lab and radiology data.  Preventive health is updated, specifically  Cancer screening and Immunization.   Was in ED on 7/20 from a fall and reduced conxciousness due to hypoglycemia The PT denies any adverse reactions to current medications since the last visit.  There are no new concerns.  There are no specific complaints   ROS Denies recent fever or chills. Denies sinus pressure, nasal congestion, ear pain or sore throat. Denies chest congestion, productive cough or wheezing. Denies chest pains, palpitations and leg swelling Denies abdominal pain, nausea, vomiting,diarrhea or constipation.   Denies dysuria, frequency, hesitancy or incontinence. Denies joint pain, swelling and limitation in mobility. Denies headaches, seizures, numbness, or tingling. Denies depression, anxiety or insomnia. Denies skin break down or rash.   PE  BP (!) 153/91 (BP Location: Left Arm, Patient Position: Sitting, Cuff Size: Normal)   Pulse (!) 104   Ht 5\' 3"  (1.6 m)   Wt 133 lb (60.3 kg)   SpO2 95%   BMI 23.56 kg/m   Patient alert and oriented and in no cardiopulmonary distress.  HEENT: No facial asymmetry, EOMI,     Neck supple .  Chest: Clear to auscultation bilaterally.  CVS: S1, S2 no murmurs, no S3.Regular rate.  ABD: Soft non tender.   Ext: No edema  MS: Adequate ROM spine, shoulders, hips and knees.  Skin: Intact, no ulcerations or rash noted.  Psych: Good eye contact, normal affect. Memory intact not anxious or depressed appearing.  CNS: CN 2-12 intact, power,  normal throughout.no focal deficits noted.   Assessment & Plan  No problem-specific Assessment & Plan notes found for this encounter.

## 2022-09-29 NOTE — Telephone Encounter (Signed)
Pt called in to check on lab results from Hospital visit . Wants a cll back when labs are in.

## 2022-09-30 ENCOUNTER — Other Ambulatory Visit: Payer: Self-pay

## 2022-09-30 DIAGNOSIS — E162 Hypoglycemia, unspecified: Secondary | ICD-10-CM | POA: Insufficient documentation

## 2022-09-30 DIAGNOSIS — W19XXXA Unspecified fall, initial encounter: Secondary | ICD-10-CM | POA: Insufficient documentation

## 2022-09-30 DIAGNOSIS — Z09 Encounter for follow-up examination after completed treatment for conditions other than malignant neoplasm: Secondary | ICD-10-CM | POA: Insufficient documentation

## 2022-09-30 DIAGNOSIS — R300 Dysuria: Secondary | ICD-10-CM

## 2022-09-30 NOTE — Assessment & Plan Note (Signed)
Elevated, states not taking med as prescribed as makes her feel light headed, will start with cozaar 50 mg daily and re eval in 4 weeks DASH diet and commitment to daily physical activity for a minimum of 30 minutes discussed and encouraged, as a part of hypertension management. The importance of attaining a healthy weight is also discussed.     09/29/2022   10:45 AM 09/29/2022   10:44 AM 09/25/2022    9:37 PM 09/25/2022    8:30 PM 09/25/2022    6:30 PM 09/25/2022    5:03 PM 09/17/2022   12:35 PM  BP/Weight  Systolic BP 153 155 150 148 166 176 151  Diastolic BP 91 84 87 76 92 92 71  Wt. (Lbs)  133    137 136  BMI  23.56 kg/m2    24.27 kg/m2 24.09 kg/m2

## 2022-09-30 NOTE — Assessment & Plan Note (Signed)
Presented to ED with blood glucose level of 48 on 7/20, reports erratic / poor eating habits with marked fluctuations in blood sugar HBA1C in office is 7.1 , an improvement, the need to eat regularl is stressed Referral to diabetic ed asap put in place

## 2022-09-30 NOTE — Assessment & Plan Note (Signed)
Hyperlipidemia:Low fat diet discussed and encouraged.   Lipid Panel  Lab Results  Component Value Date   CHOL 182 12/22/2021   HDL 85 12/22/2021   LDLCALC 86 12/22/2021   TRIG 58 12/22/2021   CHOLHDL 2.1 12/22/2021     Updated lab needed at/ before next visit.

## 2022-09-30 NOTE — Telephone Encounter (Signed)
Patient aware.

## 2022-09-30 NOTE — Assessment & Plan Note (Signed)
Seen in eD the day of the fall, CT scan head negative for brain injury, denies significant  headache or memory loss at visit, warning signs of any new neurologic complaints reviewed with direction to return to ED if occurs

## 2022-09-30 NOTE — Telephone Encounter (Signed)
Patient called asking can Dr Lodema Hong look into the urine sample she had done and give her the results.

## 2022-09-30 NOTE — Assessment & Plan Note (Signed)
Improved, but poorly managed, needs re education with close f/u and logging of blood sugar, also ordering CBG monitoring system [name] is reminded of the importance of commitment to daily physical activity for 30 minutes or more, as able and the need to limit carbohydrate intake to 30 to 60 grams per meal to help with blood sugar control.   The need to take medication as prescribed, test blood sugar as directed, and to call between visits if there is a concern that blood sugar is uncontrolled is also discussed.   [name] is reminded of the importance of daily foot exam, annual eye examination, and good blood sugar, blood pressure and cholesterol control.     Latest Ref Rng & Units 09/29/2022   11:19 AM 09/25/2022    6:07 PM 05/04/2022    3:48 PM 12/22/2021   10:44 AM 12/22/2021   10:37 AM  Diabetic Labs  HbA1c 0.0 - 7.0 % 7.1   8.2   7.7   Micro/Creat Ratio 0 - 29 mg/g creat    386    Chol 100 - 199 mg/dL     161   HDL >09 mg/dL     85   Calc LDL 0 - 99 mg/dL     86   Triglycerides 0 - 149 mg/dL     58   Creatinine 6.04 - 1.00 mg/dL  5.40  9.81   1.91       09/29/2022   10:45 AM 09/29/2022   10:44 AM 09/25/2022    9:37 PM 09/25/2022    8:30 PM 09/25/2022    6:30 PM 09/25/2022    5:03 PM 09/17/2022   12:35 PM  BP/Weight  Systolic BP 153 155 150 148 166 176 151  Diastolic BP 91 84 87 76 92 92 71  Wt. (Lbs)  133    137 136  BMI  23.56 kg/m2    24.27 kg/m2 24.09 kg/m2      Latest Ref Rng & Units 08/21/2021    8:40 AM 03/05/2021   12:00 AM  Foot/eye exam completion dates  Eye Exam No Retinopathy  No Retinopathy      Foot Form Completion  Done      This result is from an external source.

## 2022-09-30 NOTE — Assessment & Plan Note (Signed)
Patient in for follow up of recent Ed visit Discharge summary, and laboratory and radiology data are reviewed, and any questions or concerns  are discussed. Specific issues requiring follow up are specifically addressed.  

## 2022-10-01 DIAGNOSIS — I10 Essential (primary) hypertension: Secondary | ICD-10-CM | POA: Diagnosis not present

## 2022-10-01 DIAGNOSIS — E782 Mixed hyperlipidemia: Secondary | ICD-10-CM | POA: Diagnosis not present

## 2022-10-04 ENCOUNTER — Telehealth: Payer: Self-pay | Admitting: Family Medicine

## 2022-10-04 DIAGNOSIS — R300 Dysuria: Secondary | ICD-10-CM | POA: Diagnosis not present

## 2022-10-04 NOTE — Telephone Encounter (Signed)
Spoke with patient.

## 2022-10-04 NOTE — Telephone Encounter (Signed)
Patient returning nurse call about lab results. Call back # [phone].

## 2022-10-06 ENCOUNTER — Ambulatory Visit: Payer: 59 | Admitting: Family Medicine

## 2022-10-08 MED ORDER — PENICILLIN V POTASSIUM 500 MG PO TABS: 500.0000 mg | ORAL_TABLET | Freq: Three times a day (TID) | ORAL | 0 refills | Status: DC

## 2022-10-08 NOTE — Addendum Note (Signed)
Addended by: Kerri Perches on: 10/08/2022 07:08 AM   Modules accepted: Orders

## 2022-10-11 ENCOUNTER — Ambulatory Visit: Payer: 59 | Admitting: Internal Medicine

## 2022-10-15 ENCOUNTER — Other Ambulatory Visit: Payer: Self-pay | Admitting: Family Medicine

## 2022-10-15 DIAGNOSIS — E1169 Type 2 diabetes mellitus with other specified complication: Secondary | ICD-10-CM

## 2022-10-15 DIAGNOSIS — Z794 Long term (current) use of insulin: Secondary | ICD-10-CM

## 2022-10-25 ENCOUNTER — Other Ambulatory Visit: Payer: Self-pay | Admitting: Family Medicine

## 2022-10-25 DIAGNOSIS — Z794 Long term (current) use of insulin: Secondary | ICD-10-CM

## 2022-10-27 ENCOUNTER — Ambulatory Visit: Payer: 59

## 2022-10-27 LAB — HM DIABETES EYE EXAM

## 2022-11-01 ENCOUNTER — Ambulatory Visit (HOSPITAL_COMMUNITY)
Admission: RE | Admit: 2022-11-01 | Discharge: 2022-11-01 | Disposition: A | Discharge status: Home / Self Care | Payer: 59 | Source: Ambulatory Visit | Attending: Urology | Admitting: Urology

## 2022-11-01 ENCOUNTER — Ambulatory Visit: Payer: 59 | Admitting: Urology

## 2022-11-01 VITALS — BP 151/91 | HR 88

## 2022-11-01 DIAGNOSIS — Z87440 Personal history of urinary (tract) infections: Secondary | ICD-10-CM | POA: Diagnosis not present

## 2022-11-01 DIAGNOSIS — R109 Unspecified abdominal pain: Secondary | ICD-10-CM

## 2022-11-01 DIAGNOSIS — R1011 Right upper quadrant pain: Secondary | ICD-10-CM

## 2022-11-01 DIAGNOSIS — N200 Calculus of kidney: Secondary | ICD-10-CM | POA: Insufficient documentation

## 2022-11-01 DIAGNOSIS — K5900 Constipation, unspecified: Secondary | ICD-10-CM | POA: Diagnosis not present

## 2022-11-01 DIAGNOSIS — N390 Urinary tract infection, site not specified: Secondary | ICD-10-CM

## 2022-11-01 LAB — MICROSCOPIC EXAMINATION
RBC, Urine: >30 /hpf — AB (ref 0–2)
WBC, UA: >30 /hpf — AB (ref 0–5)

## 2022-11-01 LAB — URINALYSIS, ROUTINE W REFLEX MICROSCOPIC
Bilirubin, UA: NEGATIVE
Glucose, UA: NEGATIVE
Ketones, UA: NEGATIVE
Nitrite, UA: NEGATIVE
Specific Gravity, UA: 1.025 (ref 1.005–1.030)
Urobilinogen, Ur: 0.2 mg/dL (ref 0.2–1.0)
pH, UA: 6 (ref 5.0–7.5)

## 2022-11-01 NOTE — Progress Notes (Unsigned)
11/01/2022 2:00 PM   [name] February 28, 1951 [id number]  Referring provider: Kerri Perches, MD [address] [place],  [place] [address]  Followup nephrolithiasis   HPI: [name] is a 72yo here for followup for nephrolithiasis. She denies any stone events since last visit., She has chronic bilateral flank pain. KUB from today shows stable left renal calculus and possible new large right renal calculus. No recent UTI.    PMH: Past Medical History:  Diagnosis Date   BACK PAIN, THORACIC REGION, RIGHT 01/27/2010   Qualifier: Diagnosis of  By: Lodema Hong MD, Gwendalyn Ege of foot 07/15/2017   Dysrhythmia    Essential hypertension    Headache(784.0)    Hematuria 02/24/2016   History of kidney stones    Hyperlipidemia    Irregular heart beats    Microscopic hematuria 02/24/2016   Neck pain on right side 01/23/2013   Piles (hemorrhoids) 06/22/2011   Renal calculus, right 05/31/2016   Shoulder pain, right 01/25/2014   SKIN TAG 06/13/2008   Qualifier: Diagnosis of  By: Lodema Hong MD, Margaret     TIA (transient ischemic attack) 06/19/2016   Type 2 diabetes mellitus (HCC)    Vaginitis     Surgical History: Past Surgical History:  Procedure Laterality Date   COLONOSCOPY N/A 10/26/2018   Procedure: COLONOSCOPY;  Surgeon: Malissa Hippo, MD;  [facility];  Service: Endoscopy;  Laterality: N/A;  830-10:30am   COLONOSCOPY WITH PROPOFOL N/A 09/17/2022   Procedure: COLONOSCOPY WITH PROPOFOL;  Surgeon: Dolores Frame, MD;  [facility];  Service: Gastroenterology;  Laterality: N/A;  8:30AM;ASA 1-2   cyst removed Right 1998   cyst- removed from right wrist   CYSTOSCOPY WITH RETROGRADE PYELOGRAM, URETEROSCOPY AND STENT PLACEMENT Right 05/19/2020   Procedure: CYSTOSCOPY WITH RETROGRADE PYELOGRAM, URETEROSCOPY AND STENT PLACEMENT;  Surgeon: Malen Gauze, MD;  Location: [facility];  Service: Urology;  Laterality: Right;    CYSTOSCOPY WITH RETROGRADE PYELOGRAM, URETEROSCOPY AND STENT PLACEMENT Right 06/26/2020   Procedure: CYSTOSCOPY WITH RETROGRADE PYELOGRAM, URETEROSCOPY AND STENT EXCHANGE;  Surgeon: Malen Gauze, MD;  [facility]: [facility];  Service: Urology;  Laterality: Right;   EXTRACORPOREAL SHOCK WAVE LITHOTRIPSY Right 05/29/2018   Procedure: EXTRACORPOREAL SHOCK WAVE LITHOTRIPSY (ESWL);  Surgeon: Ihor Gully, MD;  [facility]: [facility];  Service: Urology;  Laterality: Right;   HOLMIUM LASER APPLICATION Right 06/26/2020   Procedure: HOLMIUM LASER APPLICATION;  Surgeon: Malen Gauze, MD;  Location: [facility];  Service: Urology;  Laterality: Right;   lithotrpsy     over 20 years   NEPHROLITHOTOMY Right 05/31/2016   Procedure: NEPHROLITHOTOMY PERCUTANEOUS WITH SURGEON ACCESS;  Surgeon: Malen Gauze, MD;  [facility];  Service: Urology;  Laterality: Right;   POLYPECTOMY  10/26/2018   Procedure: POLYPECTOMY;  Surgeon: Malissa Hippo, MD;  [facility];  Service: Endoscopy;;  colon   POLYPECTOMY  09/17/2022   Procedure: POLYPECTOMY;  Surgeon: Dolores Frame, MD;  [facility];  Service: Gastroenterology;;   TOTAL ABDOMINAL HYSTERECTOMY W/ BILATERAL SALPINGOOPHORECTOMY  1998   TUBAL LIGATION      Home Medications:  Allergies as of 11/01/2022       Reactions   Onglyza [saxagliptin Hydrochloride] Swelling   Tongue swell   Pravastatin Other (See Comments)   Muscle aches   Ace Inhibitors Cough        Medication List        Accurate as of  November 01, 2022  2:00 PM. If you have any questions, ask your nurse or doctor.          Accu-Chek Guide Me w/Device Kit 1 EACH BY DOES NOT APPLY ROUTE IN THE MORNING, AT NOON, AND AT BEDTIME. MAY SUBSTITUTE TO ANY MANUFACTURER COVERED BY PATIENT'S INSURANCE.   acetaminophen 500 MG tablet Commonly known as: TYLENOL Take 1,000 mg by mouth every 6 (six) hours as needed for headache or moderate pain.   aspirin EC 81 MG  tablet Take 1 tablet (81 mg total) by mouth daily.   atorvastatin 10 MG tablet Commonly known as: LIPITOR TAKE ONE TABLET BY MOUTH EVERY MON, WED, AND FRI, AND HALF A TABLET TUES, THURS, SAT, AND SUN What changed: See the new instructions.   B-D ULTRAFINE III SHORT PEN 31G X 8 MM Misc Generic drug: Insulin Pen Needle FOR USE WITH INSULIN ONCE DAILY DX E11.9   ferrous sulfate 325 (65 FE) MG EC tablet TAKE 1 TABLET BY MOUTH TWICE A DAY WITH MEALS   Lantus SoloStar 100 UNIT/ML Solostar Pen Generic drug: insulin glargine Inject 30 Units into the skin at bedtime.   losartan 50 MG tablet Commonly known as: COZAAR Take 1 tablet (50 mg total) by mouth daily.   meclizine 12.5 MG tablet Commonly known as: ANTIVERT Take 1 tablet (12.5 mg total) by mouth 3 (three) times daily as needed for dizziness.   metFORMIN 1000 MG tablet Commonly known as: GLUCOPHAGE TAKE 1 TABLET (1,000 MG TOTAL) BY MOUTH TWICE A DAY WITH FOOD What changed: See the new instructions.   mirabegron ER 25 MG Tb24 tablet Commonly known as: MYRBETRIQ Take 1 tablet (25 mg total) by mouth daily.   OneTouch Delica Lancets 33G Misc Once daily dx e11.9 (give lancects that go with pts meter)   OneTouch Ultra test strip Generic drug: glucose blood USE AS INSTRUCTED   Accu-Chek Guide test strip Generic drug: glucose blood 1 EACH BY IN VITRO ROUTE IN THE MORNING, AT NOON, AND AT BEDTIME. MAY SUBSTITUTE TO ANY MANUFACTURER COVERED BY PATIENT'S INSURANCE.   penicillin v potassium 500 MG tablet Commonly known as: VEETID Take 1 tablet (500 mg total) by mouth 3 (three) times daily.   Vitamin D3 50 MCG (2000 UT) Tabs Take 2,000 Units by mouth daily.        Allergies:  Allergies  Allergen Reactions   Onglyza [Saxagliptin Hydrochloride] Swelling    Tongue swell   Pravastatin Other (See Comments)    Muscle aches   Ace Inhibitors Cough    Family History: Family History  Problem Relation Age of Onset   Alcohol  abuse Mother    Esophageal cancer Father    Alcohol abuse Father    Diabetes Sister    Diabetes Sister    Hypertension Sister    Heart attack Sister        in 50's   Seizures Brother     Social History:  reports that she has never smoked. She has never used smokeless tobacco. She reports that she does not drink alcohol and does not use drugs.  ROS: All other review of systems were reviewed and are negative except what is noted above in HPI  Physical Exam: BP (!) 151/91   Pulse 88   Constitutional:  Alert and oriented, No acute distress. HEENT: [place] AT, moist mucus membranes.  Trachea midline, no masses. Cardiovascular: No clubbing, cyanosis, or edema. Respiratory: Normal respiratory effort, no increased work of breathing. GI: Abdomen is  soft, nontender, nondistended, no abdominal masses GU: No CVA tenderness.  Lymph: No cervical or inguinal lymphadenopathy. Skin: No rashes, bruises or suspicious lesions. Neurologic: Grossly intact, no focal deficits, moving all 4 extremities. Psychiatric: Normal mood and affect.  Laboratory Data: Lab Results  Component Value Date   WBC 5.6 09/25/2022   HGB 12.8 09/25/2022   HCT 41.1 09/25/2022   MCV 93.4 09/25/2022   PLT 357 09/25/2022    Lab Results  Component Value Date   CREATININE 0.81 10/01/2022    No results found for: "PSA"  No results found for: "TESTOSTERONE"  Lab Results  Component Value Date   HGBA1C 7.1 (A) 09/29/2022    Urinalysis    Component Value Date/Time   COLORURINE YELLOW 09/25/2022 1733   APPEARANCEUR CLOUDY (A) 09/25/2022 1733   APPEARANCEUR Cloudy (A) 05/03/2022 1252   LABSPEC 1.012 09/25/2022 1733   PHURINE 7.0 09/25/2022 1733   GLUCOSEU NEGATIVE 09/25/2022 1733   HGBUR MODERATE (A) 09/25/2022 1733   BILIRUBINUR NEGATIVE 09/25/2022 1733   BILIRUBINUR Negative 05/03/2022 1252   KETONESUR NEGATIVE 09/25/2022 1733   PROTEINUR 100 (A) 09/25/2022 1733   UROBILINOGEN 0.2 12/22/2021 1004    UROBILINOGEN 0.2 01/25/2014 0845   NITRITE NEGATIVE 09/25/2022 1733   LEUKOCYTESUR LARGE (A) 09/25/2022 1733    Lab Results  Component Value Date   LABMICR See below: 05/03/2022   WBCUA >30 (A) 05/03/2022   LABEPIT 0-10 05/03/2022   MUCUS Present 10/15/2021   BACTERIA NONE SEEN 09/25/2022    Pertinent Imaging: KUB today: Images reviewed and discussed with the patient  Results for orders placed during the hospital encounter of 05/03/22  Abdomen 1 view (KUB)  Narrative CLINICAL DATA:  Nephrolithiasis.  EXAM: ABDOMEN - 1 VIEW  COMPARISON:  Abdomen 01/20/2022. Renal ultrasound 09/02/2020. CT 07/05/2019.  FINDINGS: Large amount of stool noted throughout the colon making evaluation for stone disease difficult. No bowel distention. Bilateral nephrolithiasis again noted with stable appearance. No evidence of ureteral stone. Degenerative changes lumbar spine and both hips.  IMPRESSION: 1. Large amount of stool noted throughout the colon making evaluation for stone disease difficult. No bowel distention. 2. Bilateral nephrolithiasis again noted with stable appearance. No evidence of ureteral stone.   Electronically Signed By: Maisie Fus  Register M.D. On: 05/04/2022 10:10  No results found for this or any previous visit.  No results found for this or any previous visit.  No results found for this or any previous visit.  Results for orders placed during the hospital encounter of 09/02/20  Ultrasound renal complete  Narrative CLINICAL DATA:  Nephrolithiasis  EXAM: RENAL / URINARY TRACT ULTRASOUND COMPLETE  COMPARISON:  July 05, 2019 February 14, 2020  FINDINGS: Right Kidney:  Renal measurements: 10.5 x 5.0 x 6.2 cm = volume: 168 mL. Echogenicity within normal limits. There is decreased pelviectasis in comparison to prior. No hydronephrosis. Mild prominence of the proximal ureter, similar in comparison prior. Previously described nephrolithiasis within the renal  pelvis is no longer visualized within the pelvis. There is a 1.4 cm nephrolithiasis within the inferior pole of the RIGHT kidney.  Left Kidney:  Renal measurements: 11.4 x 7.5 x 5.2 cm = volume: 231 mL. Echogenicity within normal limits. No hydronephrosis. There is a 19 mm nephrolithiasis within the inferior pole of the LEFT kidney.  Bladder:  Appears normal for degree of bladder distention.  Other:  None.  IMPRESSION: 1. Decreased pelviectasis of the RIGHT kidney in comparison to prior. No frank hydronephrosis. 2. There are bilateral  nephrolithiasis.   Electronically Signed By: Meda Klinefelter MD On: 09/03/2020 10:30  No valid procedures specified. No results found for this or any previous visit.  No results found for this or any previous visit.   Assessment & Plan:    1. Nephrolithiasis -CT stone study - Urinalysis, Routine w reflex microscopic  2. Recurrent UTI Urine for culture   No follow-ups on file.  Wilkie Aye, MD  [facility] [place]

## 2022-11-01 NOTE — H&P (View-Only) (Signed)
11/01/2022 2:00 PM   [name] [birth year] [id number]  Referring provider: Kerri Perches, MD [address] [place],  [place] [address]  Followup nephrolithiasis   HPI: [name] is a 72yo here for followup for nephrolithiasis. She denies any stone events since last visit., She has chronic bilateral flank pain. KUB from today shows stable left renal calculus and possible new large right renal calculus. No recent UTI.    PMH: Past Medical History:  Diagnosis Date   BACK PAIN, THORACIC REGION, RIGHT 01/27/2010   Qualifier: Diagnosis of  By: Lodema Hong MD, Gwendalyn Ege of foot 07/15/2017   Dysrhythmia    Essential hypertension    Headache(784.0)    Hematuria 02/24/2016   History of kidney stones    Hyperlipidemia    Irregular heart beats    Microscopic hematuria 02/24/2016   Neck pain on right side 01/23/2013   Piles (hemorrhoids) 06/22/2011   Renal calculus, right 05/31/2016   Shoulder pain, right 01/25/2014   SKIN TAG 06/13/2008   Qualifier: Diagnosis of  By: Lodema Hong MD, Margaret     TIA (transient ischemic attack) 06/19/2016   Type 2 diabetes mellitus (HCC)    Vaginitis     Surgical History: Past Surgical History:  Procedure Laterality Date   COLONOSCOPY N/A 10/26/2018   Procedure: COLONOSCOPY;  Surgeon: Malissa Hippo, MD;  [facility];  Service: Endoscopy;  Laterality: N/A;  830-10:30am   COLONOSCOPY WITH PROPOFOL N/A 09/17/2022   Procedure: COLONOSCOPY WITH PROPOFOL;  Surgeon: Dolores Frame, MD;  [facility];  Service: Gastroenterology;  Laterality: N/A;  8:30AM;ASA 1-2   cyst removed Right 1998   cyst- removed from right wrist   CYSTOSCOPY WITH RETROGRADE PYELOGRAM, URETEROSCOPY AND STENT PLACEMENT Right 05/19/2020   Procedure: CYSTOSCOPY WITH RETROGRADE PYELOGRAM, URETEROSCOPY AND STENT PLACEMENT;  Surgeon: Malen Gauze, MD;  [facility];  Service: Urology;  Laterality: Right;    CYSTOSCOPY WITH RETROGRADE PYELOGRAM, URETEROSCOPY AND STENT PLACEMENT Right 06/26/2020   Procedure: CYSTOSCOPY WITH RETROGRADE PYELOGRAM, URETEROSCOPY AND STENT EXCHANGE;  Surgeon: Malen Gauze, MD;  [facility];  Service: Urology;  Laterality: Right;   EXTRACORPOREAL SHOCK WAVE LITHOTRIPSY Right 05/29/2018   Procedure: EXTRACORPOREAL SHOCK WAVE LITHOTRIPSY (ESWL);  Surgeon: Ihor Gully, MD;  [facility];  Service: Urology;  Laterality: Right;   HOLMIUM LASER APPLICATION Right 06/26/2020   Procedure: HOLMIUM LASER APPLICATION;  Surgeon: Malen Gauze, MD;  Location: [facility];  Service: Urology;  Laterality: Right;   lithotrpsy     over 20 years   NEPHROLITHOTOMY Right 05/31/2016   Procedure: NEPHROLITHOTOMY PERCUTANEOUS WITH SURGEON ACCESS;  Surgeon: Malen Gauze, MD;  [facility];  Service: Urology;  Laterality: Right;   POLYPECTOMY  10/26/2018   Procedure: POLYPECTOMY;  Surgeon: Malissa Hippo, MD;  [facility];  Service: Endoscopy;;  colon   POLYPECTOMY  09/17/2022   Procedure: POLYPECTOMY;  Surgeon: Dolores Frame, MD;  [facility];  Service: Gastroenterology;;   TOTAL ABDOMINAL HYSTERECTOMY W/ BILATERAL SALPINGOOPHORECTOMY  1998   TUBAL LIGATION      Home Medications:  Allergies as of 11/01/2022       Reactions   Onglyza [saxagliptin Hydrochloride] Swelling   Tongue swell   Pravastatin Other (See Comments)   Muscle aches   Ace Inhibitors Cough        Medication List        Accurate as of  November 01, 2022  2:00 PM. If you have any questions, ask your nurse or doctor.          Accu-Chek Guide Me w/Device Kit 1 EACH BY DOES NOT APPLY ROUTE IN THE MORNING, AT NOON, AND AT BEDTIME. MAY SUBSTITUTE TO ANY MANUFACTURER COVERED BY PATIENT'S INSURANCE.   acetaminophen 500 MG tablet Commonly known as: TYLENOL Take 1,000 mg by mouth every 6 (six) hours as needed for headache or moderate pain.   aspirin EC 81 MG  tablet Take 1 tablet (81 mg total) by mouth daily.   atorvastatin 10 MG tablet Commonly known as: LIPITOR TAKE ONE TABLET BY MOUTH EVERY MON, WED, AND FRI, AND HALF A TABLET TUES, THURS, SAT, AND SUN What changed: See the new instructions.   B-D ULTRAFINE III SHORT PEN 31G X 8 MM Misc Generic drug: Insulin Pen Needle FOR USE WITH INSULIN ONCE DAILY DX E11.9   ferrous sulfate 325 (65 FE) MG EC tablet TAKE 1 TABLET BY MOUTH TWICE A DAY WITH MEALS   Lantus SoloStar 100 UNIT/ML Solostar Pen Generic drug: insulin glargine Inject 30 Units into the skin at bedtime.   losartan 50 MG tablet Commonly known as: COZAAR Take 1 tablet (50 mg total) by mouth daily.   meclizine 12.5 MG tablet Commonly known as: ANTIVERT Take 1 tablet (12.5 mg total) by mouth 3 (three) times daily as needed for dizziness.   metFORMIN 1000 MG tablet Commonly known as: GLUCOPHAGE TAKE 1 TABLET (1,000 MG TOTAL) BY MOUTH TWICE A DAY WITH FOOD What changed: See the new instructions.   mirabegron ER 25 MG Tb24 tablet Commonly known as: MYRBETRIQ Take 1 tablet (25 mg total) by mouth daily.   OneTouch Delica Lancets 33G Misc Once daily dx e11.9 (give lancects that go with pts meter)   OneTouch Ultra test strip Generic drug: glucose blood USE AS INSTRUCTED   Accu-Chek Guide test strip Generic drug: glucose blood 1 EACH BY IN VITRO ROUTE IN THE MORNING, AT NOON, AND AT BEDTIME. MAY SUBSTITUTE TO ANY MANUFACTURER COVERED BY PATIENT'S INSURANCE.   penicillin v potassium 500 MG tablet Commonly known as: VEETID Take 1 tablet (500 mg total) by mouth 3 (three) times daily.   Vitamin D3 50 MCG (2000 UT) Tabs Take 2,000 Units by mouth daily.        Allergies:  Allergies  Allergen Reactions   Onglyza [Saxagliptin Hydrochloride] Swelling    Tongue swell   Pravastatin Other (See Comments)    Muscle aches   Ace Inhibitors Cough    Family History: Family History  Problem Relation Age of Onset   Alcohol  abuse Mother    Esophageal cancer Father    Alcohol abuse Father    Diabetes Sister    Diabetes Sister    Hypertension Sister    Heart attack Sister        in 70's   Seizures Brother     Social History:  reports that she has never smoked. She has never used smokeless tobacco. She reports that she does not drink alcohol and does not use drugs.  ROS: All other review of systems were reviewed and are negative except what is noted above in HPI  Physical Exam: BP (!) 151/91   Pulse 88   Constitutional:  Alert and oriented, No acute distress. HEENT: [place] AT, moist mucus membranes.  Trachea midline, no masses. Cardiovascular: No clubbing, cyanosis, or edema. Respiratory: Normal respiratory effort, no increased work of breathing. GI: Abdomen is  soft, nontender, nondistended, no abdominal masses GU: No CVA tenderness.  Lymph: No cervical or inguinal lymphadenopathy. Skin: No rashes, bruises or suspicious lesions. Neurologic: Grossly intact, no focal deficits, moving all 4 extremities. Psychiatric: Normal mood and affect.  Laboratory Data: Lab Results  Component Value Date   WBC 5.6 09/25/2022   HGB 12.8 09/25/2022   HCT 41.1 09/25/2022   MCV 93.4 09/25/2022   PLT 357 09/25/2022    Lab Results  Component Value Date   CREATININE 0.81 10/01/2022    No results found for: "PSA"  No results found for: "TESTOSTERONE"  Lab Results  Component Value Date   HGBA1C 7.1 (A) 09/29/2022    Urinalysis    Component Value Date/Time   COLORURINE YELLOW 09/25/2022 1733   APPEARANCEUR CLOUDY (A) 09/25/2022 1733   APPEARANCEUR Cloudy (A) 05/03/2022 1252   LABSPEC 1.012 09/25/2022 1733   PHURINE 7.0 09/25/2022 1733   GLUCOSEU NEGATIVE 09/25/2022 1733   HGBUR MODERATE (A) 09/25/2022 1733   BILIRUBINUR NEGATIVE 09/25/2022 1733   BILIRUBINUR Negative 05/03/2022 1252   KETONESUR NEGATIVE 09/25/2022 1733   PROTEINUR 100 (A) 09/25/2022 1733   UROBILINOGEN 0.2 12/22/2021 1004    UROBILINOGEN 0.2 01/25/2014 0845   NITRITE NEGATIVE 09/25/2022 1733   LEUKOCYTESUR LARGE (A) 09/25/2022 1733    Lab Results  Component Value Date   LABMICR See below: 05/03/2022   WBCUA >30 (A) 05/03/2022   LABEPIT 0-10 05/03/2022   MUCUS Present 10/15/2021   BACTERIA NONE SEEN 09/25/2022    Pertinent Imaging: KUB today: Images reviewed and discussed with the patient  Results for orders placed during the hospital encounter of 05/03/22  Abdomen 1 view (KUB)  Narrative CLINICAL DATA:  Nephrolithiasis.  EXAM: ABDOMEN - 1 VIEW  COMPARISON:  Abdomen 01/20/2022. Renal ultrasound 09/02/2020. CT 07/05/2019.  FINDINGS: Large amount of stool noted throughout the colon making evaluation for stone disease difficult. No bowel distention. Bilateral nephrolithiasis again noted with stable appearance. No evidence of ureteral stone. Degenerative changes lumbar spine and both hips.  IMPRESSION: 1. Large amount of stool noted throughout the colon making evaluation for stone disease difficult. No bowel distention. 2. Bilateral nephrolithiasis again noted with stable appearance. No evidence of ureteral stone.   Electronically Signed By: Maisie Fus  Register M.D. On: 05/04/2022 10:10  No results found for this or any previous visit.  No results found for this or any previous visit.  No results found for this or any previous visit.  Results for orders placed during the hospital encounter of 09/02/20  Ultrasound renal complete  Narrative CLINICAL DATA:  Nephrolithiasis  EXAM: RENAL / URINARY TRACT ULTRASOUND COMPLETE  COMPARISON:  July 05, 2019 February 14, 2020  FINDINGS: Right Kidney:  Renal measurements: 10.5 x 5.0 x 6.2 cm = volume: 168 mL. Echogenicity within normal limits. There is decreased pelviectasis in comparison to prior. No hydronephrosis. Mild prominence of the proximal ureter, similar in comparison prior. Previously described nephrolithiasis within the renal  pelvis is no longer visualized within the pelvis. There is a 1.4 cm nephrolithiasis within the inferior pole of the RIGHT kidney.  Left Kidney:  Renal measurements: 11.4 x 7.5 x 5.2 cm = volume: 231 mL. Echogenicity within normal limits. No hydronephrosis. There is a 19 mm nephrolithiasis within the inferior pole of the LEFT kidney.  Bladder:  Appears normal for degree of bladder distention.  Other:  None.  IMPRESSION: 1. Decreased pelviectasis of the RIGHT kidney in comparison to prior. No frank hydronephrosis. 2. There are bilateral  nephrolithiasis.   Electronically Signed By: Meda Klinefelter MD On: 09/03/2020 10:30  No valid procedures specified. No results found for this or any previous visit.  No results found for this or any previous visit.   Assessment & Plan:    1. Nephrolithiasis -CT stone study - Urinalysis, Routine w reflex microscopic  2. Recurrent UTI Urine for culture   No follow-ups on file.  Wilkie Aye, MD  [facility] [place]

## 2022-11-02 ENCOUNTER — Ambulatory Visit (HOSPITAL_COMMUNITY)
Admission: RE | Admit: 2022-11-02 | Discharge: 2022-11-02 | Disposition: A | Discharge status: Home / Self Care | Payer: 59 | Source: Ambulatory Visit | Attending: Urology | Admitting: Urology

## 2022-11-02 DIAGNOSIS — R109 Unspecified abdominal pain: Secondary | ICD-10-CM | POA: Diagnosis not present

## 2022-11-02 DIAGNOSIS — R1011 Right upper quadrant pain: Secondary | ICD-10-CM | POA: Diagnosis not present

## 2022-11-02 DIAGNOSIS — K5730 Diverticulosis of large intestine without perforation or abscess without bleeding: Secondary | ICD-10-CM | POA: Diagnosis not present

## 2022-11-02 DIAGNOSIS — N132 Hydronephrosis with renal and ureteral calculous obstruction: Secondary | ICD-10-CM | POA: Diagnosis not present

## 2022-11-03 ENCOUNTER — Encounter: Payer: Self-pay | Admitting: Urology

## 2022-11-03 NOTE — Patient Instructions (Signed)

## 2022-11-05 LAB — URINE CULTURE

## 2022-11-09 ENCOUNTER — Telehealth: Payer: Self-pay

## 2022-11-09 NOTE — Telephone Encounter (Signed)
-----   Message from Wilkie Aye sent at 11/09/2022  9:24 AM EDT ----- Ct shows a 1.2cm right UPJ csalculus which is new. She can either proceedw ith ESWL or ureteroscopy ----- Message ----- From: Interface, Rad Results In Sent: 11/02/2022   7:56 AM EDT To: Malen Gauze, MD

## 2022-11-09 NOTE — Telephone Encounter (Signed)
Patient called and made aware. Patient will keep upcoming appointment with NP to discuss options in more detail.

## 2022-11-11 ENCOUNTER — Encounter: Payer: Self-pay | Admitting: Urology

## 2022-11-11 ENCOUNTER — Ambulatory Visit: Payer: 59 | Admitting: Urology

## 2022-11-11 VITALS — BP 168/81 | HR 88 | Temp 98.4°F

## 2022-11-11 DIAGNOSIS — N200 Calculus of kidney: Secondary | ICD-10-CM

## 2022-11-11 DIAGNOSIS — N201 Calculus of ureter: Secondary | ICD-10-CM

## 2022-11-11 DIAGNOSIS — N1330 Unspecified hydronephrosis: Secondary | ICD-10-CM

## 2022-11-11 LAB — URINALYSIS, ROUTINE W REFLEX MICROSCOPIC
Bilirubin, UA: NEGATIVE
Ketones, UA: NEGATIVE
Nitrite, UA: NEGATIVE
Specific Gravity, UA: 1.025 (ref 1.005–1.030)
Urobilinogen, Ur: 0.2 mg/dL (ref 0.2–1.0)
pH, UA: 6 (ref 5.0–7.5)

## 2022-11-11 LAB — MICROSCOPIC EXAMINATION
RBC, Urine: >30 /HPF — AB (ref 0–2)
WBC, UA: >30 /HPF — AB (ref 0–5)

## 2022-11-11 NOTE — Progress Notes (Signed)
Name: [name] DOB: 01/19/1951 MRN: [id number]  History of Present Illness: [name] is a 72 y.o. female who presents today for follow up visit at [facility] [place]. - GU History: 1. Kidney stones. Has chronic bilateral flank pain.   At last visit with Dr. Ronne Binning on 11/01/2022: - KUB showed stable left renal calculus and possible new large right renal calculus. - The plan was CT stone study.  Since last visit: > 11/02/2022: CT stone study showed: 1. Moderate right hydronephrosis secondary to a 1.2 x 0.7 cm stone at the right UPJ. 2. Bilateral nephrolithiasis.  > 11/09/2022: Per result note by Dr. Ronne Binning: "She can either proceed with ESWL or ureteroscopy"   Today: She denies recent episode of stone passage. She reports bilateral flank pain (R > L) which is described as mild and intermittent. Reports chronic LLQ abdominal pain. States she has been managing fine with OTC analgesics as needed. She denies fevers or nausea/ vomiting.  She denies increased urinary urgency, frequency, nocturia, dysuria, gross hematuria, hesitancy, straining to void, or sensations of incomplete emptying.   Medications: Current Outpatient Medications  Medication Sig Dispense Refill   ACCU-CHEK GUIDE test strip 1 EACH BY IN VITRO ROUTE IN THE MORNING, AT NOON, AND AT BEDTIME. MAY SUBSTITUTE TO ANY MANUFACTURER COVERED BY PATIENT'S INSURANCE. 100 strip 0   acetaminophen (TYLENOL) 500 MG tablet Take 1,000 mg by mouth every 6 (six) hours as needed for headache or moderate pain.     aspirin EC 81 MG tablet Take 1 tablet (81 mg total) by mouth daily.     atorvastatin (LIPITOR) 10 MG tablet TAKE ONE TABLET BY MOUTH EVERY MON, WED, AND FRI, AND HALF A TABLET TUES, THURS, SAT, AND SUN (Patient taking differently: Take 5-10 mg by mouth See admin instructions. TAKE ONE TABLET BY MOUTH EVERY MON, WED, AND FRI, AND HALF A TABLET TUES, THURS, SAT, AND SUN) 60 tablet 3   B-D ULTRAFINE III SHORT  PEN 31G X 8 MM MISC FOR USE WITH INSULIN ONCE DAILY DX E11.9 100 each 5   Blood Glucose Monitoring Suppl (ACCU-CHEK GUIDE ME) w/Device KIT 1 EACH BY DOES NOT APPLY ROUTE IN THE MORNING, AT NOON, AND AT BEDTIME. MAY SUBSTITUTE TO ANY MANUFACTURER COVERED BY PATIENT'S INSURANCE. 1 kit 0   Cholecalciferol (VITAMIN D3) 50 MCG (2000 UT) TABS Take 2,000 Units by mouth daily.     ferrous sulfate 325 (65 FE) MG EC tablet TAKE 1 TABLET BY MOUTH TWICE A DAY WITH MEALS 180 tablet 1   insulin glargine (LANTUS SOLOSTAR) 100 UNIT/ML Solostar Pen Inject 30 Units into the skin at bedtime. 15 mL 5   losartan (COZAAR) 50 MG tablet Take 1 tablet (50 mg total) by mouth daily. 90 tablet 1   meclizine (ANTIVERT) 12.5 MG tablet Take 1 tablet (12.5 mg total) by mouth 3 (three) times daily as needed for dizziness. 30 tablet 0   metFORMIN (GLUCOPHAGE) 1000 MG tablet TAKE 1 TABLET (1,000 MG TOTAL) BY MOUTH TWICE A DAY WITH FOOD (Patient taking differently: Take 1,000 mg by mouth 2 (two) times daily with a meal.) 180 tablet 1   mirabegron ER (MYRBETRIQ) 25 MG TB24 tablet Take 1 tablet (25 mg total) by mouth daily. 30 tablet 5   OneTouch Delica Lancets 33G MISC Once daily dx e11.9 (give lancects that go with pts meter) 100 each 5   ONETOUCH ULTRA test strip USE AS INSTRUCTED 100 strip 5   penicillin v potassium (VEETID) 500  MG tablet Take 1 tablet (500 mg total) by mouth 3 (three) times daily. 15 tablet 0   No current facility-administered medications for this visit.    Allergies: Allergies  Allergen Reactions   Onglyza [Saxagliptin Hydrochloride] Swelling    Tongue swell   Pravastatin Other (See Comments)    Muscle aches   Ace Inhibitors Cough    Past Medical History:  Diagnosis Date   BACK PAIN, THORACIC REGION, RIGHT 01/27/2010   Qualifier: Diagnosis of  By: Lodema Hong MD, Gwendalyn Ege of foot 07/15/2017   Dysrhythmia    Essential hypertension    Headache(784.0)    Hematuria 02/24/2016   History of  kidney stones    Hyperlipidemia    Irregular heart beats    Microscopic hematuria 02/24/2016   Neck pain on right side 01/23/2013   Piles (hemorrhoids) 06/22/2011   Renal calculus, right 05/31/2016   Shoulder pain, right 01/25/2014   SKIN TAG 06/13/2008   Qualifier: Diagnosis of  By: Lodema Hong MD, Margaret     TIA (transient ischemic attack) 06/19/2016   Type 2 diabetes mellitus (HCC)    Vaginitis    Past Surgical History:  Procedure Laterality Date   COLONOSCOPY N/A 10/26/2018   Procedure: COLONOSCOPY;  Surgeon: Malissa Hippo, MD;  [facility]: [facility];  Service: Endoscopy;  Laterality: N/A;  830-10:30am   COLONOSCOPY WITH PROPOFOL N/A 09/17/2022   Procedure: COLONOSCOPY WITH PROPOFOL;  Surgeon: Dolores Frame, MD;  Location: [facility];  Service: Gastroenterology;  Laterality: N/A;  8:30AM;ASA 1-2   cyst removed Right 1998   cyst- removed from right wrist   CYSTOSCOPY WITH RETROGRADE PYELOGRAM, URETEROSCOPY AND STENT PLACEMENT Right 05/19/2020   Procedure: CYSTOSCOPY WITH RETROGRADE PYELOGRAM, URETEROSCOPY AND STENT PLACEMENT;  Surgeon: Malen Gauze, MD;  Location: [facility];  Service: Urology;  Laterality: Right;   CYSTOSCOPY WITH RETROGRADE PYELOGRAM, URETEROSCOPY AND STENT PLACEMENT Right 06/26/2020   Procedure: CYSTOSCOPY WITH RETROGRADE PYELOGRAM, URETEROSCOPY AND STENT EXCHANGE;  Surgeon: Malen Gauze, MD;  Location: [facility];  Service: Urology;  Laterality: Right;   EXTRACORPOREAL SHOCK WAVE LITHOTRIPSY Right 05/29/2018   Procedure: EXTRACORPOREAL SHOCK WAVE LITHOTRIPSY (ESWL);  Surgeon: Ihor Gully, MD;  Location: [facility];  Service: Urology;  Laterality: Right;   HOLMIUM LASER APPLICATION Right 06/26/2020   Procedure: HOLMIUM LASER APPLICATION;  Surgeon: Malen Gauze, MD;  Location: [facility];  Service: Urology;  Laterality: Right;   lithotrpsy     over 20 years   NEPHROLITHOTOMY Right 05/31/2016   Procedure: NEPHROLITHOTOMY PERCUTANEOUS WITH  SURGEON ACCESS;  Surgeon: Malen Gauze, MD;  Location: [facility];  Service: Urology;  Laterality: Right;   POLYPECTOMY  10/26/2018   Procedure: POLYPECTOMY;  Surgeon: Malissa Hippo, MD;  [facility];  Service: Endoscopy;;  colon   POLYPECTOMY  09/17/2022   Procedure: POLYPECTOMY;  Surgeon: Dolores Frame, MD;  Location: [facility];  Service: Gastroenterology;;   TOTAL ABDOMINAL HYSTERECTOMY W/ BILATERAL SALPINGOOPHORECTOMY  1998   TUBAL LIGATION     Family History  Problem Relation Age of Onset   Alcohol abuse Mother    Esophageal cancer Father    Alcohol abuse Father    Diabetes Sister    Diabetes Sister    Hypertension Sister    Heart attack Sister        in 56's   Seizures Brother    Social History   Socioeconomic History   Marital status: Single  Spouse name: Not on file   Number of children: 1   Years of education: Not on file   Highest education level: 12th grade  Occupational History   Occupation: employed   Tobacco Use   Smoking status: Never   Smokeless tobacco: Never  Vaping Use   Vaping status: Never Used  Substance and Sexual Activity   Alcohol use: No   Drug use: No   Sexual activity: Not Currently  Other Topics Concern   Not on file  Social History Narrative   Not on file   Social Determinants of Health   Financial Resource Strain: Low Risk  (02/16/2022)   Overall Financial Resource Strain (CARDIA)    Difficulty of Paying Living Expenses: Not hard at all  Food Insecurity: No Food Insecurity (02/16/2022)   Hunger Vital Sign    Worried About Running Out of Food in the Last Year: Never true    Ran Out of Food in the Last Year: Never true  Transportation Needs: No Transportation Needs (02/16/2022)   PRAPARE - Administrator, Civil Service (Medical): No    Lack of Transportation (Non-Medical): No  Physical Activity: Sufficiently Active (02/16/2022)   Exercise Vital Sign    Days of Exercise per Week: 3 days     Minutes of Exercise per Session: 50 min  Stress: No Stress Concern Present (02/16/2022)   [employer] of Occupational Health - Occupational Stress Questionnaire    Feeling of Stress : Only a little  Social Connections: Moderately Isolated (02/16/2022)   Social Connection and Isolation Panel [NHANES]    Frequency of Communication with Friends and Family: Twice a week    Frequency of Social Gatherings with Friends and Family: Three times a week    Attends Religious Services: 1 to 4 times per year    Active Member of Clubs or Organizations: No    Attends Banker Meetings: Never    Marital Status: Separated  Intimate Partner Violence: Not At Risk (02/16/2022)   Humiliation, Afraid, Rape, and Kick questionnaire    Fear of Current or Ex-Partner: No    Emotionally Abused: No    Physically Abused: No    Sexually Abused: No    SUBJECTIVE  Review of Systems Constitutional: Patient denies any unintentional weight loss or change in strength lntegumentary: Patient denies any rashes or pruritus Cardiovascular: Patient denies chest pain or syncope Respiratory: Patient denies shortness of breath Gastrointestinal: Patient denies nausea, vomiting, constipation, or diarrhea Musculoskeletal: Patient denies muscle cramps or weakness Neurologic: Patient denies convulsions or seizures Psychiatric: Patient denies memory problems Allergic/Immunologic: Patient denies recent allergic reaction(s) Hematologic/Lymphatic: Patient denies bleeding tendencies Endocrine: Patient denies heat/cold intolerance  GU: As per HPI.  OBJECTIVE Vitals:   11/11/22 1343  BP: (!) 168/81  Pulse: 88  Temp: 98.4 F (36.9 C)   There is no height or weight on file to calculate BMI.  Physical Examination Constitutional: No obvious distress; patient is non-toxic appearing  Cardiovascular: No visible lower extremity edema.  Respiratory: The patient does not have audible wheezing/stridor;  respirations do not appear labored  Gastrointestinal: Abdomen non-distended Musculoskeletal: Normal ROM of UEs  Skin: No obvious rashes/open sores  Neurologic: CN 2-12 grossly intact Psychiatric: Answered questions appropriately with normal affect  Hematologic/Lymphatic/Immunologic: No obvious bruises or sites of spontaneous bleeding  UA: >30 WBC/hpf, >30 RBC/hpf, bacteria (moderate), calcium oxalate  ASSESSMENT Obstruction of right ureteropelvic junction (UPJ) due to stone - Plan: Urinalysis, Routine w reflex microscopic, Ambulatory Referral For  Surgery Scheduling  Hydronephrosis of right kidney - Plan: Urinalysis, Routine w reflex microscopic, Ambulatory Referral For Surgery Scheduling  Bilateral kidney stones - Plan: Urinalysis, Routine w reflex microscopic, Ambulatory Referral For Surgery Scheduling  We reviewed recent imaging results and Dr. Dimas Millin recommendation for extracorporeal shock wave lithotripsy (ESWL) or ureteroscopic stone manipulation (URS) to address the obstructing right UPJ stone. We discussed possible risks and benefits of each option including but not limited to: including pain, infection, need for stenting, hematuria, anesthesia complications, and possible need for repeat procedure(s). She elected to proceed with right ESWL; surgery request order placed. Pt verbalized understanding and agreement. All questions were answered.  PLAN Advised the following: OTC analgesics PRN. Return for surgery (right ESWL).  Orders Placed This Encounter  Procedures   Microscopic Examination   Urinalysis, Routine w reflex microscopic   Ambulatory Referral For Surgery Scheduling    Referral Priority:   Routine    Referral Type:   Consultation    Number of Visits Requested:   1    It has been explained that the patient is to follow regularly with their PCP in addition to all other providers involved in their care and to follow instructions provided by these respective offices.  Patient advised to contact urology clinic if any urologic-pertaining questions, concerns, new symptoms or problems arise in the interim period.  There are no Patient Instructions on file for this visit.  Electronically signed by:  Donnita Falls, MSN, FNP-C, CUNP 11/11/2022 4:37 PM

## 2022-11-12 ENCOUNTER — Ambulatory Visit (INDEPENDENT_AMBULATORY_CARE_PROVIDER_SITE_OTHER): Payer: 59 | Admitting: Family Medicine

## 2022-11-12 ENCOUNTER — Encounter: Payer: Self-pay | Admitting: Family Medicine

## 2022-11-12 ENCOUNTER — Other Ambulatory Visit: Payer: Self-pay

## 2022-11-12 VITALS — BP 152/80 | HR 89 | Ht 63.0 in | Wt 136.0 lb

## 2022-11-12 DIAGNOSIS — Z23 Encounter for immunization: Secondary | ICD-10-CM

## 2022-11-12 DIAGNOSIS — E1169 Type 2 diabetes mellitus with other specified complication: Secondary | ICD-10-CM | POA: Diagnosis not present

## 2022-11-12 DIAGNOSIS — E782 Mixed hyperlipidemia: Secondary | ICD-10-CM

## 2022-11-12 DIAGNOSIS — Z9181 History of falling: Secondary | ICD-10-CM | POA: Diagnosis not present

## 2022-11-12 DIAGNOSIS — Z794 Long term (current) use of insulin: Secondary | ICD-10-CM

## 2022-11-12 DIAGNOSIS — W19XXXD Unspecified fall, subsequent encounter: Secondary | ICD-10-CM

## 2022-11-12 DIAGNOSIS — I10 Essential (primary) hypertension: Secondary | ICD-10-CM | POA: Diagnosis not present

## 2022-11-12 DIAGNOSIS — N200 Calculus of kidney: Secondary | ICD-10-CM

## 2022-11-12 MED ORDER — AMLODIPINE BESYLATE 5 MG PO TABS: 5.0000 mg | ORAL_TABLET | Freq: Every day | ORAL | 2 refills | Status: DC

## 2022-11-12 MED ORDER — OLMESARTAN MEDOXOMIL 20 MG PO TABS: 20.0000 mg | ORAL_TABLET | Freq: Every day | ORAL | 2 refills | Status: DC

## 2022-11-12 MED ORDER — DEXCOM G7 SENSOR MISC: 3 refills | Status: DC

## 2022-11-12 MED ORDER — DEXCOM G7 RECEIVER DEVI: 0 refills | Status: DC

## 2022-11-12 NOTE — Progress Notes (Unsigned)
   [name]     MRN: [id number]      DOB: 1950/12/19  Chief Complaint  Patient presents with   Chronic disorders    F/u concerns about needing home health care at night.    Fall    F/u   Immunizations    Flu- yes given 11/12/2022 Foot exam needed.    Dizziness    Unsteady on feet. Feeling off balance.questions about diet and medications.     HPI [name] is here for follow up and re-evaluation of chronic medical conditions, medication management and review of any available recent lab and radiology data.  Preventive health is updated, specifically  Cancer screening and Immunization.   Questions or concerns regarding consultations or procedures which the PT has had in the interim are  addressed. The PT denies any adverse reactions to current medications since the last visit.  There are no new concerns.  There are no specific complaints   ROS Denies recent fever or chills. Denies sinus pressure, nasal congestion, ear pain or sore throat. Denies chest congestion, productive cough or wheezing. Denies chest pains, palpitations and leg swelling Denies abdominal pain, nausea, vomiting,diarrhea or constipation.   Denies dysuria, frequency, hesitancy or incontinence. Denies joint pain, swelling and limitation in mobility. Denies headaches, seizures, numbness, or tingling. Denies depression, anxiety or insomnia. Denies skin break down or rash.   PE  BP (!) 152/80 (BP Location: Left Arm, Patient Position: Sitting, Cuff Size: Normal)   Pulse 89   Ht 5\' 3"  (1.6 m)   Wt 136 lb 0.6 oz (61.7 kg)   SpO2 97%   BMI 24.10 kg/m   Patient alert and oriented and in no cardiopulmonary distress.  HEENT: No facial asymmetry, EOMI,     Neck supple .  Chest: Clear to auscultation bilaterally.  CVS: S1, S2 no murmurs, no S3.Regular rate.  ABD: Soft non tender.   Ext: No edema  MS: Adequate ROM spine, shoulders, hips and knees.  Skin: Intact, no ulcerations or rash  noted.  Psych: Good eye contact, normal affect. Memory intact not anxious or depressed appearing.  CNS: CN 2-12 intact, power,  normal throughout.no focal deficits noted.   Assessment & Plan  No problem-specific Assessment & Plan notes found for this encounter.

## 2022-11-12 NOTE — Patient Instructions (Signed)
F/u in 6 to 7  weeks re evaluate blood pressure  STOP LOSARTAN  NEW for blood pressure is amlodipine 5 mg one daily and olmesartan 20 mg one daily, take them together at th sme time every morning , start tomorro  Nurse please try and arrange continuous bloodglucose monitor for pt, reprts low blood sugar in the mornings in the 50's  Bring meds, blood sugar log to next visit please like you did today, it makes the visit a good one!  Thanks for choosing [facility], we consider it a privelige to serve you.

## 2022-11-13 ENCOUNTER — Other Ambulatory Visit: Payer: Self-pay | Admitting: Family Medicine

## 2022-11-17 NOTE — Assessment & Plan Note (Addendum)
Ms. Matteo is reminded of the importance of commitment to daily physical activity for 30 minutes or more, as able and the need to limit carbohydrate intake to 30 to 60 grams per meal to help with blood sugar control.  Improved but reports fluctuation in blood sugar ith hypoglycemia  The need to take medication as prescribed, test blood sugar as directed, and to call between visits if there is a concern that blood sugar is uncontrolled is also discussed.   Ms. Keckler is reminded of the importance of daily foot exam, annual eye examination, and good blood sugar, blood pressure and cholesterol control.     Latest Ref Rng & Units 10/01/2022    4:07 PM 09/29/2022   11:19 AM 09/25/2022    6:07 PM 05/04/2022    3:48 PM 12/22/2021   10:44 AM  Diabetic Labs  HbA1c 0.0 - 7.0 %  7.1   8.2    Micro/Creat Ratio 0 - 29 mg/g creat     386   Chol 100 - 199 mg/dL 161       HDL >09 mg/dL 96       Calc LDL 0 - 99 mg/dL 91       Triglycerides 0 - 149 mg/dL 77       Creatinine 6.04 - 1.00 mg/dL 5.40   9.81  1.91        11/12/2022   11:08 AM 11/12/2022   10:51 AM 11/11/2022    1:43 PM 11/01/2022    1:48 PM 09/29/2022   10:45 AM 09/29/2022   10:44 AM 09/25/2022    9:37 PM  BP/Weight  Systolic BP 152 159 168 151 153 155 150  Diastolic BP 80 82 81 91 91 84 87  Wt. (Lbs)  136.04    133   BMI  24.1 kg/m2    23.56 kg/m2       Latest Ref Rng & Units 10/27/2022   12:00 AM 08/21/2021    8:40 AM  Foot/eye exam completion dates  Eye Exam No Retinopathy No Retinopathy       Foot Form Completion   Done     This result is from an external source.

## 2022-11-17 NOTE — Assessment & Plan Note (Signed)
No laceration or abrasion from fall, no residual injury

## 2022-11-17 NOTE — Assessment & Plan Note (Signed)
Hyperlipidemia:Low fat diet discussed and encouraged.   Lipid Panel  Lab Results  Component Value Date   CHOL 201 (H) 10/01/2022   HDL 96 10/01/2022   LDLCALC 91 10/01/2022   TRIG 77 10/01/2022   CHOLHDL 2.1 10/01/2022     Needs to reduce fat in diet , no med change

## 2022-11-17 NOTE — Assessment & Plan Note (Signed)
Uncontrolled, change to olmesartan 20 mg and amlodipine 5 mg , re eval in 6 to 8 weeks [name] is reminded of the importance of commitment to daily physical activity for 30 minutes or more, as able and the need to limit carbohydrate intake to 30 to 60 grams per meal to help with blood sugar control.   The need to take medication as prescribed, test blood sugar as directed, and to call between visits if there is a concern that blood sugar is uncontrolled is also discussed.   [name] is reminded of the importance of daily foot exam, annual eye examination, and good blood sugar, blood pressure and cholesterol control.     Latest Ref Rng & Units 10/01/2022    4:07 PM 09/29/2022   11:19 AM 09/25/2022    6:07 PM 05/04/2022    3:48 PM 12/22/2021   10:44 AM  Diabetic Labs  HbA1c 0.0 - 7.0 %  7.1   8.2    Micro/Creat Ratio 0 - 29 mg/g creat     386   Chol 100 - 199 mg/dL 161       HDL >09 mg/dL 96       Calc LDL 0 - 99 mg/dL 91       Triglycerides 0 - 149 mg/dL 77       Creatinine 6.04 - 1.00 mg/dL 5.40   9.81  1.91        11/12/2022   11:08 AM 11/12/2022   10:51 AM 11/11/2022    1:43 PM 11/01/2022    1:48 PM 09/29/2022   10:45 AM 09/29/2022   10:44 AM 09/25/2022    9:37 PM  BP/Weight  Systolic BP 152 159 168 151 153 155 150  Diastolic BP 80 82 81 91 91 84 87  Wt. (Lbs)  136.04    133   BMI  24.1 kg/m2    23.56 kg/m2       Latest Ref Rng & Units 10/27/2022   12:00 AM 08/21/2021    8:40 AM  Foot/eye exam completion dates  Eye Exam No Retinopathy No Retinopathy       Foot Form Completion   Done     This result is from an external source.

## 2022-11-18 ENCOUNTER — Telehealth: Payer: Self-pay

## 2022-11-18 ENCOUNTER — Ambulatory Visit: Payer: 59

## 2022-11-18 ENCOUNTER — Ambulatory Visit (INDEPENDENT_AMBULATORY_CARE_PROVIDER_SITE_OTHER): Payer: 59

## 2022-11-18 ENCOUNTER — Telehealth: Payer: Self-pay | Admitting: Urology

## 2022-11-18 DIAGNOSIS — N200 Calculus of kidney: Secondary | ICD-10-CM

## 2022-11-18 DIAGNOSIS — Z01812 Encounter for preprocedural laboratory examination: Secondary | ICD-10-CM

## 2022-11-18 MED ORDER — CIPROFLOXACIN HCL 250 MG PO TABS
250.0000 mg | ORAL_TABLET | Freq: Two times a day (BID) | ORAL | 0 refills | Status: DC
Start: 2022-11-18 — End: 2022-11-19

## 2022-11-18 NOTE — Progress Notes (Signed)
Patient presents today with complaints of  urinalysis per patient request prior to surgery.  UA and Culture done today.  Dr Annabell Howells  reviewed results and Cipro BID x 7 days . Patient aware of MD recommendations and that we will reach out with culture results.      [id number], CMA

## 2022-11-18 NOTE — Telephone Encounter (Signed)
Patient is made aware to drop off urine to check prior to litho. Voiced understanding

## 2022-11-18 NOTE — Telephone Encounter (Signed)
Patient  called she want someone to call her about her UA  because she doesn't want to do the procedure if she has an infection.  The last time she had the procedure done she ended up admitted to the hospital because she had and infection and no one checked it?

## 2022-11-18 NOTE — Telephone Encounter (Signed)
Detailed message left on patient voiced mail making her aware.

## 2022-11-19 ENCOUNTER — Telehealth: Payer: Self-pay

## 2022-11-19 ENCOUNTER — Other Ambulatory Visit: Payer: Self-pay

## 2022-11-19 ENCOUNTER — Encounter (HOSPITAL_COMMUNITY): Payer: Self-pay | Admitting: Urology

## 2022-11-19 ENCOUNTER — Encounter (HOSPITAL_COMMUNITY)
Admission: RE | Admit: 2022-11-19 | Discharge: 2022-11-19 | Disposition: A | Discharge status: Home / Self Care | Payer: 59 | Source: Ambulatory Visit | Attending: Urology | Admitting: Urology

## 2022-11-19 DIAGNOSIS — N390 Urinary tract infection, site not specified: Secondary | ICD-10-CM

## 2022-11-19 DIAGNOSIS — N201 Calculus of ureter: Secondary | ICD-10-CM

## 2022-11-19 DIAGNOSIS — N200 Calculus of kidney: Secondary | ICD-10-CM

## 2022-11-19 LAB — URINALYSIS, ROUTINE W REFLEX MICROSCOPIC
Bilirubin, UA: NEGATIVE
Glucose, UA: NEGATIVE
Ketones, UA: NEGATIVE
Nitrite, UA: NEGATIVE
Specific Gravity, UA: 1.02 (ref 1.005–1.030)
Urobilinogen, Ur: 1 mg/dL (ref 0.2–1.0)
pH, UA: 6 (ref 5.0–7.5)

## 2022-11-19 LAB — MICROSCOPIC EXAMINATION
RBC, Urine: >30 /HPF — AB (ref 0–2)
WBC, UA: >30 /HPF — AB (ref 0–5)

## 2022-11-19 MED ORDER — NITROFURANTOIN MONOHYD MACRO 100 MG PO CAPS
100.0000 mg | ORAL_CAPSULE | Freq: Two times a day (BID) | ORAL | 0 refills | Status: AC
Start: 2022-11-19 — End: ?

## 2022-11-19 NOTE — Telephone Encounter (Signed)
Consulted with Dr. Ronne Binning about patient having litho while taking abt for urine infection. Patient is made aware per Dr. Ronne Binning to Discontinue Cipro 250 BID X 7 days and start Macrobid BID X 7 days, patient is made aware to continued abt. Patient voiced understanding

## 2022-11-23 ENCOUNTER — Encounter (HOSPITAL_COMMUNITY): Payer: Self-pay | Admitting: Urology

## 2022-11-23 ENCOUNTER — Encounter (HOSPITAL_COMMUNITY)
Admission: RE | Disposition: A | Discharge status: Home / Self Care | Payer: Self-pay | Source: Home / Self Care | Attending: Urology

## 2022-11-23 ENCOUNTER — Ambulatory Visit (HOSPITAL_COMMUNITY): Payer: 59

## 2022-11-23 ENCOUNTER — Ambulatory Visit (HOSPITAL_COMMUNITY)
Admission: RE | Admit: 2022-11-23 | Discharge: 2022-11-23 | Disposition: A | Discharge status: Home / Self Care | Payer: 59 | Attending: Urology | Admitting: Urology

## 2022-11-23 DIAGNOSIS — Z794 Long term (current) use of insulin: Secondary | ICD-10-CM | POA: Insufficient documentation

## 2022-11-23 DIAGNOSIS — I10 Essential (primary) hypertension: Secondary | ICD-10-CM | POA: Insufficient documentation

## 2022-11-23 DIAGNOSIS — Z87442 Personal history of urinary calculi: Secondary | ICD-10-CM | POA: Diagnosis not present

## 2022-11-23 DIAGNOSIS — N202 Calculus of kidney with calculus of ureter: Secondary | ICD-10-CM | POA: Insufficient documentation

## 2022-11-23 DIAGNOSIS — N200 Calculus of kidney: Secondary | ICD-10-CM

## 2022-11-23 DIAGNOSIS — E119 Type 2 diabetes mellitus without complications: Secondary | ICD-10-CM | POA: Insufficient documentation

## 2022-11-23 DIAGNOSIS — Z7984 Long term (current) use of oral hypoglycemic drugs: Secondary | ICD-10-CM | POA: Insufficient documentation

## 2022-11-23 HISTORY — PX: EXTRACORPOREAL SHOCK WAVE LITHOTRIPSY: SHX1557

## 2022-11-23 LAB — URINE CULTURE

## 2022-11-23 LAB — GLUCOSE, CAPILLARY: Glucose-Capillary: 111 mg/dL — ABNORMAL HIGH (ref 70–99)

## 2022-11-23 SURGERY — LITHOTRIPSY, ESWL
Anesthesia: LOCAL | Laterality: Right

## 2022-11-23 MED ORDER — OXYCODONE-ACETAMINOPHEN 5-325 MG PO TABS
1.0000 | ORAL_TABLET | ORAL | 0 refills | Status: AC | PRN
Start: 2022-11-23 — End: 2023-11-23

## 2022-11-23 MED ORDER — DIPHENHYDRAMINE HCL 25 MG PO CAPS
25.0000 mg | ORAL_CAPSULE | ORAL | Status: AC
  Administered 2022-11-23: 25 mg via ORAL
  Filled 2022-11-23: qty 1

## 2022-11-23 MED ORDER — DIAZEPAM 5 MG PO TABS
10.0000 mg | ORAL_TABLET | Freq: Once | ORAL | Status: AC
  Administered 2022-11-23: 10 mg via ORAL
  Filled 2022-11-23: qty 2

## 2022-11-23 MED ORDER — SODIUM CHLORIDE 0.9 % IV SOLN: INTRAVENOUS | Status: DC

## 2022-11-23 NOTE — Interval H&P Note (Signed)
History and Physical Interval Note:  11/23/2022 8:01 AM  [name]  has presented today for surgery, with the diagnosis of Right UPJ Stone.  The various methods of treatment have been discussed with the patient and family. After consideration of risks, benefits and other options for treatment, the patient has consented to  Procedure(s): EXTRACORPOREAL SHOCK WAVE LITHOTRIPSY (ESWL) (Right) as a surgical intervention.  The patient's history has been reviewed, patient examined, no change in status, stable for surgery.  I have reviewed the patient's chart and labs.  Questions were answered to the patient's satisfaction.     Wilkie Aye

## 2022-11-24 ENCOUNTER — Encounter (HOSPITAL_COMMUNITY): Payer: Self-pay | Admitting: Urology

## 2022-11-25 ENCOUNTER — Telehealth: Payer: Self-pay

## 2022-11-25 NOTE — Telephone Encounter (Signed)
Patient called after her lithotripsy procedure she states she is feeling fine however, her thermometer is not working she would like to come by to have her temperature checked, she does report some dizziness.  I returned her call and patient was already in office.  Patient was still concerned about occasional dizziness, no fever at this time.  Last temp was 98.8.  She is asking if there is any cause for concern with the dizziness or any recommendations.

## 2022-11-25 NOTE — Progress Notes (Signed)
Patient called back our dept. Patient states she received a call from our dept with message states "just checking on her". Patient then states she is unable to check her temp due to "thermometer not working". Patient states she is sweating. Patient asked if she can come by here to get temp checked. I encouraged patient if she is concerned about having a fever to call Dr.McKenzie's office. Patient verbalizes understanding.

## 2022-11-30 NOTE — Telephone Encounter (Signed)
I informed patient of Sarah's response.  Patient states she has been dealing with the dizziness for a while, she notices it more after taking her blood pressure medication.  I advised patient to reach out to her PCP regarding this issue.  Patient voiced understanding.

## 2022-12-06 NOTE — Progress Notes (Unsigned)
Name: [name] DOB: Aug 19, 1950 MRN: [id number]  Diagnoses: 1) Post-operative state  HPI: [name] presents post-operatively s/p right ESWL procedure on 11/23/2022 by Dr. Ronne Binning for management of a right UPJ stone.  Postop course: Patient did not go for KUB yet today.  Today She reports passage of some small stone fragments. Denies fevers, flank pain, or abdominal pain. She denies increased urinary urgency, frequency, nocturia, dysuria, gross hematuria, hesitancy, straining to void, or sensations of incomplete emptying.   Fall Screening: Do you usually have a device to assist in your mobility? No   Medications: Current Outpatient Medications  Medication Sig Dispense Refill   ACCU-CHEK GUIDE test strip 1 EACH BY IN VITRO ROUTE IN THE MORNING, AT NOON, AND AT BEDTIME. MAY SUBSTITUTE TO ANY MANUFACTURER COVERED BY PATIENT'S INSURANCE. 100 strip 0   acetaminophen (TYLENOL) 500 MG tablet Take 1,000 mg by mouth every 6 (six) hours as needed for headache or moderate pain.     amLODipine (NORVASC) 5 MG tablet TAKE 1 TABLET (5 MG TOTAL) BY MOUTH DAILY. 90 tablet 0   aspirin EC 81 MG tablet Take 81 mg by mouth daily.     atorvastatin (LIPITOR) 10 MG tablet TAKE ONE TABLET BY MOUTH EVERY MON, WED, AND FRI, AND HALF A TABLET TUES, THURS, SAT, AND SUN (Patient taking differently: Take 5-10 mg by mouth See admin instructions. TAKE ONE TABLET BY MOUTH EVERY MON, WED, AND FRI, AND HALF A TABLET TUES, THURS, SAT, AND SUN) 60 tablet 3   B-D ULTRAFINE III SHORT PEN 31G X 8 MM MISC FOR USE WITH INSULIN ONCE DAILY DX E11.9 100 each 5   Blood Glucose Monitoring Suppl (ACCU-CHEK GUIDE ME) w/Device KIT 1 EACH BY DOES NOT APPLY ROUTE IN THE MORNING, AT NOON, AND AT BEDTIME. MAY SUBSTITUTE TO ANY MANUFACTURER COVERED BY PATIENT'S INSURANCE. 1 kit 0   Cholecalciferol (VITAMIN D3) 50 MCG (2000 UT) TABS Take 2,000 Units by mouth daily.     Continuous Glucose Receiver (DEXCOM G7 RECEIVER) DEVI Use  to check blood sugar daily. DX : E11.69 1 each 0   Continuous Glucose Sensor (DEXCOM G7 SENSOR) MISC Use to check blood sugar daily. DX: E11.69 1 each 3   ferrous sulfate 325 (65 FE) MG EC tablet TAKE 1 TABLET BY MOUTH TWICE A DAY WITH MEALS 180 tablet 1   glucose 4 GM chewable tablet Chew 1 tablet by mouth as needed for low blood sugar.     insulin glargine (LANTUS SOLOSTAR) 100 UNIT/ML Solostar Pen Inject 30 Units into the skin at bedtime. 15 mL 5   meclizine (ANTIVERT) 12.5 MG tablet Take 1 tablet (12.5 mg total) by mouth 3 (three) times daily as needed for dizziness. 30 tablet 0   metFORMIN (GLUCOPHAGE) 1000 MG tablet TAKE 1 TABLET BY MOUTH TWICE A DAY WITH FOOD 180 tablet 1   olmesartan (BENICAR) 20 MG tablet TAKE 1 TABLET BY MOUTH EVERY DAY 90 tablet 0   OneTouch Delica Lancets 33G MISC Once daily dx e11.9 (give lancects that go with pts meter) 100 each 5   ONETOUCH ULTRA test strip USE AS INSTRUCTED 100 strip 5   No current facility-administered medications for this visit.    Allergies: Allergies  Allergen Reactions   Onglyza [Saxagliptin Hydrochloride] Swelling    Tongue swell   Pravastatin Other (See Comments)    Muscle aches   Ace Inhibitors Cough    Past Medical History:  Diagnosis Date   BACK PAIN, THORACIC REGION, RIGHT  01/27/2010   Qualifier: Diagnosis of  By: Lodema Hong MD, Gwendalyn Ege of foot 07/15/2017   Dysrhythmia    Essential hypertension    Headache(784.0)    Hematuria 02/24/2016   History of kidney stones    Hyperlipidemia    Irregular heart beats    Microscopic hematuria 02/24/2016   Neck pain on right side 01/23/2013   Piles (hemorrhoids) 06/22/2011   Renal calculus, right 05/31/2016   Shoulder pain, right 01/25/2014   SKIN TAG 06/13/2008   Qualifier: Diagnosis of  By: Lodema Hong MD, Margaret     TIA (transient ischemic attack) 06/19/2016   Type 2 diabetes mellitus (HCC)    Vaginitis    Past Surgical History:  Procedure Laterality Date    COLONOSCOPY N/A 10/26/2018   Procedure: COLONOSCOPY;  Surgeon: Malissa Hippo, MD;  Location: [facility];  Service: Endoscopy;  Laterality: N/A;  830-10:30am   COLONOSCOPY WITH PROPOFOL N/A 09/17/2022   Procedure: COLONOSCOPY WITH PROPOFOL;  Surgeon: Dolores Frame, MD;  Location: [facility];  Service: Gastroenterology;  Laterality: N/A;  8:30AM;ASA 1-2   cyst removed Right 1998   cyst- removed from right wrist   CYSTOSCOPY WITH RETROGRADE PYELOGRAM, URETEROSCOPY AND STENT PLACEMENT Right 05/19/2020   Procedure: CYSTOSCOPY WITH RETROGRADE PYELOGRAM, URETEROSCOPY AND STENT PLACEMENT;  Surgeon: Malen Gauze, MD;  Location: [facility];  Service: Urology;  Laterality: Right;   CYSTOSCOPY WITH RETROGRADE PYELOGRAM, URETEROSCOPY AND STENT PLACEMENT Right 06/26/2020   Procedure: CYSTOSCOPY WITH RETROGRADE PYELOGRAM, URETEROSCOPY AND STENT EXCHANGE;  Surgeon: Malen Gauze, MD;  Location: [facility];  Service: Urology;  Laterality: Right;   EXTRACORPOREAL SHOCK WAVE LITHOTRIPSY Right 05/29/2018   Procedure: EXTRACORPOREAL SHOCK WAVE LITHOTRIPSY (ESWL);  Surgeon: Ihor Gully, MD;  Location: [facility];  Service: Urology;  Laterality: Right;   EXTRACORPOREAL SHOCK WAVE LITHOTRIPSY Right 11/23/2022   Procedure: EXTRACORPOREAL SHOCK WAVE LITHOTRIPSY (ESWL);  Surgeon: Malen Gauze, MD;  Location: [facility];  Service: Urology;  Laterality: Right;   HOLMIUM LASER APPLICATION Right 06/26/2020   Procedure: HOLMIUM LASER APPLICATION;  Surgeon: Malen Gauze, MD;  Location: [facility];  Service: Urology;  Laterality: Right;   lithotrpsy     over 20 years   NEPHROLITHOTOMY Right 05/31/2016   Procedure: NEPHROLITHOTOMY PERCUTANEOUS WITH SURGEON ACCESS;  Surgeon: Malen Gauze, MD;  Location: [facility];  Service: Urology;  Laterality: Right;   POLYPECTOMY  10/26/2018   Procedure: POLYPECTOMY;  Surgeon: Malissa Hippo, MD;  [facility]: [facility];  Service: Endoscopy;;  colon   POLYPECTOMY   09/17/2022   Procedure: POLYPECTOMY;  Surgeon: Dolores Frame, MD;  Location: [facility];  Service: Gastroenterology;;   TOTAL ABDOMINAL HYSTERECTOMY W/ BILATERAL SALPINGOOPHORECTOMY  1998   TUBAL LIGATION     Family History  Problem Relation Age of Onset   Alcohol abuse Mother    Esophageal cancer Father    Alcohol abuse Father    Diabetes Sister    Diabetes Sister    Hypertension Sister    Heart attack Sister        in 81's   Seizures Brother    Social History   Socioeconomic History   Marital status: Single    Spouse name: Not on file   Number of children: 1   Years of education: Not on file   Highest education level: 12th grade  Occupational History   Occupation: employed   Tobacco Use   Smoking status: Never   Smokeless  tobacco: Never  Vaping Use   Vaping status: Never Used  Substance and Sexual Activity   Alcohol use: No   Drug use: No   Sexual activity: Not Currently  Other Topics Concern   Not on file  Social History Narrative   Not on file   Social Determinants of Health   Financial Resource Strain: Low Risk  (02/16/2022)   Overall Financial Resource Strain (CARDIA)    Difficulty of Paying Living Expenses: Not hard at all  Food Insecurity: No Food Insecurity (02/16/2022)   Hunger Vital Sign    Worried About Running Out of Food in the Last Year: Never true    Ran Out of Food in the Last Year: Never true  Transportation Needs: No Transportation Needs (02/16/2022)   PRAPARE - Administrator, Civil Service (Medical): No    Lack of Transportation (Non-Medical): No  Physical Activity: Sufficiently Active (02/16/2022)   Exercise Vital Sign    Days of Exercise per Week: 3 days    Minutes of Exercise per Session: 50 min  Stress: No Stress Concern Present (02/16/2022)   [employer] of Occupational Health - Occupational Stress Questionnaire    Feeling of Stress : Only a little  Social Connections: Moderately Isolated  (02/16/2022)   Social Connection and Isolation Panel [NHANES]    Frequency of Communication with Friends and Family: Twice a week    Frequency of Social Gatherings with Friends and Family: Three times a week    Attends Religious Services: 1 to 4 times per year    Active Member of Clubs or Organizations: No    Attends Banker Meetings: Never    Marital Status: Separated  Intimate Partner Violence: Not At Risk (02/16/2022)   Humiliation, Afraid, Rape, and Kick questionnaire    Fear of Current or Ex-Partner: No    Emotionally Abused: No    Physically Abused: No    Sexually Abused: No    SUBJECTIVE  Review of Systems Constitutional: Patient denies any unintentional weight loss or change in strength lntegumentary: Patient denies any rashes or pruritus Cardiovascular: Patient denies chest pain or syncope Respiratory: Patient denies shortness of breath Gastrointestinal: Patient denies nausea, vomiting, constipation, or diarrhea Musculoskeletal: Patient denies muscle cramps or weakness Neurologic: Patient denies convulsions or seizures Psychiatric: Patient denies memory problems Allergic/Immunologic: Patient denies recent allergic reaction(s) Hematologic/Lymphatic: Patient denies bleeding tendencies Endocrine: Patient denies heat/cold intolerance  GU: As per HPI.  OBJECTIVE Vitals:   12/09/22 1422  BP: (!) 167/95  Pulse: 99  Temp: 98 F (36.7 C)   There is no height or weight on file to calculate BMI.  Physical Examination Constitutional: No obvious distress; patient is non-toxic appearing  Cardiovascular: No visible lower extremity edema.  Respiratory: The patient does not have audible wheezing/stridor; respirations do not appear labored  Gastrointestinal: Abdomen non-distended Musculoskeletal: Normal ROM of UEs  Skin: No obvious rashes/open sores  Neurologic: CN 2-12 grossly intact Psychiatric: Answered questions appropriately with normal affect   Hematologic/Lymphatic/Immunologic: No obvious bruises or sites of spontaneous bleeding  UA: >30 WBC/hpf, >30 RBC/hpf, bacteria (moderate), calcium oxalate crystals  ASSESSMENT Nephrolithiasis - Plan: Urinalysis, Routine w reflex microscopic, Calculi, with Photograph (to Clinical Lab), DG Abd 1 View  Postop check - Plan: Urinalysis, Routine w reflex microscopic, Calculi, with Photograph (to Clinical Lab)  We reviewed the operative procedures and findings. She is doing well. Pre-operative symptoms are resolved  since the procedure. Advised to get KUB today to confirm passage of  stone fragments.   For stone prevention: Advised adequate hydration and we discussed option to consider low oxalate diet given that calcium oxalate is the most common type of stone. Handout provided about stone prevention diet.  Of note, she reports that she discontinued Myrbetriq due to side effect concerns.   Will plan to follow up in 6 months with KUB for stone surveillance or sooner if needed. Pt verbalized understanding and agreement. All questions were answered.  PLAN Advised the following: KUB today. Stone analysis. Maintain adequate fluid intake. Low oxalate diet. Return in about 6 months (around 06/09/2023) for KUB, UA, & f/u with Evette Georges NP.   Orders Placed This Encounter  Procedures   DG Abd 1 View    Standing Status:   Future    Standing Expiration Date:   12/09/2023    Order Specific Question:   Reason for Exam (SYMPTOM  OR DIAGNOSIS REQUIRED)    Answer:   kidney stone    Order Specific Question:   Preferred imaging location?    Answer:   [facility]   Urinalysis, Routine w reflex microscopic   Calculi, with Photograph (to Clinical Lab)   It has been explained that the patient is to follow regularly with their PCP in addition to all other providers involved in their care and to follow instructions provided by these respective offices. Patient advised to contact urology clinic if  any urologic-pertaining questions, concerns, new symptoms or problems arise in the interim period.  Patient Instructions  >80% of stones are calcium oxalate. This type of stones forms when body either isn't clearing oxalate well enough, is making too much oxalate, or too little citrate. This results in oxalate binding to form crystals, which continue to aggregate and form stones.  Limiting calcium does not help, but limiting oxalate in the diet can help. Increasing citric acid intake may also help.  The following measures may help to prevent the recurrence of stones: Increase water intake to 2-2.5 liters per day May add citrus juice (lemon, lime or orange juice) to water Moderation in dairy foods Decrease in salt content 5. Low Oxalate diet: Oxylates are found in foods like Tomato, Spinach, red wine and chocolate (see additional resources below).  Internet resources for information regarding low oxalate diet: https://[email or web address].yangchunwu.com https://my.VerticalStretch.be  Foods Low in Sodium or Oxalate Foods You Can Eat  Drinks Coffee, fruit and veggie juice (using the recommended veggies), fruit punch  Fruits Apples, apricots (fresh or canned), avocado, bananas, cherries (sweet), cranberries, grapefruit, red or green grapes, lemon and lime juice, melons, nectarines, papayas, peaches, pears, pineapples, oranges, strawberries (fresh), tangerines  Veggies Artichokes, asparagus, bamboo shoots, broccoli, brussels sprouts, cabbage, cauliflower, chayote squash, chicory, corn, cucumbers, endive, lettuce, lima beans, mushrooms, onions, peas, peppers, potatoes, radishes, rutabagas, zucchini  Breads, Cereals, Grains Egg noodles, rye bread, cooked and dry cereals without nuts or bran, crackers with unsalted tops, white or wild rice  Meat, Meat Replacements, Fish, Recruitment consultant, fish, poultry, eggs, egg whites,  egg replacements  Soup Homemade soup (using the recommended veggies and meat), low-sodium bouillon, low-sodium canned  Desserts Cookies, cakes, ice cream, pudding without chocolate or nuts, candy without chocolate or nuts  Fats and Oils Butter, margarine, cream, oil, salad dressing, mayo  Other Foods Unsalted potato chips or pretzels, herbs (like garlic, garlic powder, onion powder), lemon juice, salt-free seasoning blends, vinegar  Other Foods Low in Oxalate Foods You Can Eat  Drinks Beer, cola, wine, buttermilk, lemonade or limeade (without added vitamin  C), milk  Meat, Meat Replacements, Fish, [employer], ham, bacon, hot dogs, bratwurst, sausage, chicken nuggets, cheddar cheese, canned fish and shellfish  Soup Tomato soup, cheese soup  Other Foods Coconuts, lemon or lime juices, sugar or sweeteners, jellies or jams (from the recommended list)   Moderate-Oxalate Foods Foods to Limit   Drinks Fruit and veggie juices (from the list below), chocolate milk, rice milk, hot cocoa, tea   Fruits Blackberries, blueberries, black currants, cherries (sour), fruit cocktail, mangoes, orange peel, prunes, purple plums   Veggies Baked beans, carrots, celery, green beans, parsnips, summer squash, tomatoes, turnips   Breads, Cereals, Grains White bread, cornbread or cornmeal, white English muffins, saltine or soda crackers, brown rice, vanilla wafers, spaghetti and other noodles, firm tofu, bagels, oatmeal   Meat/meat replacements, fish, poultry Sardines   Desserts Chocolate cake   Fats and Oils Macadamia nuts, pistachio nuts, English walnuts   Other Foods Jams or jellies (made with the fruits above), pepper    High-Oxalate Foods Foods to Avoid Drinks Chocolate drink mixes, soy milk, Ovaltine, instant iced tea, fruit juices of fruits listed below Fruits Apricots (dried), red currants, figs, kiwi, plums, rhubarb Veggies Beans (wax, dried), beets and beet greens, chives, collard greens, eggplant,  escarole, dark greens of all kinds, leeks, okra, parsley, rutabagas, spinach, Swiss chard, tomato paste, watercress Breads, Cereals, Grains Amaranth, barley, white corn flour, fried potatoes, fruitcake, grits, soybean products, sweet potatoes, wheat germ and bran, buckwheat flour, All Bran cereal, graham crackers, pretzels, whole wheat bread Meat/meat replacements, fish, poultry  Dried beans, peanut butter, soy burgers, miso  Desserts Carob, chocolate, marmalades Fats and Oils Nuts (peanuts, almonds, pecans, cashews, hazelnuts), nut butters, sesame seeds, tahini paste Other Foods Poppy seeds   Electronically signed by:  Donnita Falls, MSN, FNP-C, CUNP 12/09/2022 3:00 PM

## 2022-12-09 ENCOUNTER — Encounter: Payer: 59 | Attending: Family Medicine | Admitting: Nutrition

## 2022-12-09 ENCOUNTER — Encounter: Payer: Self-pay | Admitting: Nutrition

## 2022-12-09 ENCOUNTER — Encounter: Payer: Self-pay | Admitting: Urology

## 2022-12-09 ENCOUNTER — Ambulatory Visit (INDEPENDENT_AMBULATORY_CARE_PROVIDER_SITE_OTHER): Payer: 59 | Admitting: Urology

## 2022-12-09 VITALS — Ht 63.0 in | Wt 136.0 lb

## 2022-12-09 VITALS — BP 167/95 | HR 99 | Temp 98.0°F

## 2022-12-09 DIAGNOSIS — E782 Mixed hyperlipidemia: Secondary | ICD-10-CM | POA: Diagnosis not present

## 2022-12-09 DIAGNOSIS — I10 Essential (primary) hypertension: Secondary | ICD-10-CM | POA: Diagnosis not present

## 2022-12-09 DIAGNOSIS — E1169 Type 2 diabetes mellitus with other specified complication: Secondary | ICD-10-CM | POA: Insufficient documentation

## 2022-12-09 DIAGNOSIS — Z09 Encounter for follow-up examination after completed treatment for conditions other than malignant neoplasm: Secondary | ICD-10-CM

## 2022-12-09 DIAGNOSIS — N200 Calculus of kidney: Secondary | ICD-10-CM

## 2022-12-09 DIAGNOSIS — E162 Hypoglycemia, unspecified: Secondary | ICD-10-CM | POA: Diagnosis not present

## 2022-12-09 DIAGNOSIS — Z87442 Personal history of urinary calculi: Secondary | ICD-10-CM

## 2022-12-09 NOTE — Progress Notes (Signed)
Medical Nutrition Therapy  Appointment Start time:  1300  Appointment End time:  1400  Primary concerns today: DM  Referral diagnosis: E11.8 Preferred learning style: NO preference  (auditory, visual, hands on, no preference indicated) Learning readiness: Readyf   NUTRITION ASSESSMENT Re establishing with pt. Was seen by me 2 years ago. Has had some episodes of low blood sugars recently. Had a recent fall and went to ER for bump on her head.  Notes she ate a bacon egg and cheese biscuit from [employer] this am. Felt nauseated this am after breakfast.  30 units of Lantus at night and 1 Metformin 500 mg a day. Lives by herselft and sometimes doesn't eat a lot at one time. Doesn't like to cook a lot for herself. Will bring dexcom to get put on next week.  FBS yesterday am was  63 mg/dl  Clinical Medical Hx:  Past Medical History:  Diagnosis Date   BACK PAIN, THORACIC REGION, RIGHT 01/27/2010   Qualifier: Diagnosis of  By: Lodema Hong MD, Gwendalyn Ege of foot 07/15/2017   Dysrhythmia    Essential hypertension    Headache(784.0)    Hematuria 02/24/2016   History of kidney stones    Hyperlipidemia    Irregular heart beats    Microscopic hematuria 02/24/2016   Neck pain on right side 01/23/2013   Piles (hemorrhoids) 06/22/2011   Renal calculus, right 05/31/2016   Shoulder pain, right 01/25/2014   SKIN TAG 06/13/2008   Qualifier: Diagnosis of  By: Lodema Hong MD, Margaret     TIA (transient ischemic attack) 06/19/2016   Type 2 diabetes mellitus (HCC)    Vaginitis     Medications:  Current Outpatient Medications on File Prior to Visit  Medication Sig Dispense Refill   ACCU-CHEK GUIDE test strip 1 EACH BY IN VITRO ROUTE IN THE MORNING, AT NOON, AND AT BEDTIME. MAY SUBSTITUTE TO ANY MANUFACTURER COVERED BY PATIENT'S INSURANCE. 100 strip 0   acetaminophen (TYLENOL) 500 MG tablet Take 1,000 mg by mouth every 6 (six) hours as needed for headache or moderate pain.     amLODipine  (NORVASC) 5 MG tablet TAKE 1 TABLET (5 MG TOTAL) BY MOUTH DAILY. 90 tablet 0   aspirin EC 81 MG tablet Take 1 tablet (81 mg total) by mouth daily. (Patient not taking: Reported on 11/12/2022)     atorvastatin (LIPITOR) 10 MG tablet TAKE ONE TABLET BY MOUTH EVERY MON, WED, AND FRI, AND HALF A TABLET TUES, THURS, SAT, AND SUN (Patient taking differently: Take 5-10 mg by mouth See admin instructions. TAKE ONE TABLET BY MOUTH EVERY MON, WED, AND FRI, AND HALF A TABLET TUES, THURS, SAT, AND SUN) 60 tablet 3   B-D ULTRAFINE III SHORT PEN 31G X 8 MM MISC FOR USE WITH INSULIN ONCE DAILY DX E11.9 100 each 5   Blood Glucose Monitoring Suppl (ACCU-CHEK GUIDE ME) w/Device KIT 1 EACH BY DOES NOT APPLY ROUTE IN THE MORNING, AT NOON, AND AT BEDTIME. MAY SUBSTITUTE TO ANY MANUFACTURER COVERED BY PATIENT'S INSURANCE. 1 kit 0   Cholecalciferol (VITAMIN D3) 50 MCG (2000 UT) TABS Take 2,000 Units by mouth daily.     Continuous Glucose Receiver (DEXCOM G7 RECEIVER) DEVI Use to check blood sugar daily. DX : E11.69 1 each 0   Continuous Glucose Sensor (DEXCOM G7 SENSOR) MISC Use to check blood sugar daily. DX: E11.69 1 each 3   ferrous sulfate 325 (65 FE) MG EC tablet TAKE 1 TABLET BY MOUTH TWICE A  DAY WITH MEALS 180 tablet 1   glucose 4 GM chewable tablet Chew 1 tablet by mouth as needed for low blood sugar.     insulin glargine (LANTUS SOLOSTAR) 100 UNIT/ML Solostar Pen Inject 30 Units into the skin at bedtime. (Patient not taking: Reported on 11/12/2022) 15 mL 5   meclizine (ANTIVERT) 12.5 MG tablet Take 1 tablet (12.5 mg total) by mouth 3 (three) times daily as needed for dizziness. (Patient not taking: Reported on 11/12/2022) 30 tablet 0   metFORMIN (GLUCOPHAGE) 1000 MG tablet TAKE 1 TABLET BY MOUTH TWICE A DAY WITH FOOD 180 tablet 1   mirabegron ER (MYRBETRIQ) 25 MG TB24 tablet Take 1 tablet (25 mg total) by mouth daily. (Patient not taking: Reported on 11/12/2022) 30 tablet 5   nitrofurantoin, macrocrystal-monohydrate,  (MACROBID) 100 MG capsule Take 1 capsule (100 mg total) by mouth every 12 (twelve) hours. 14 capsule 0   olmesartan (BENICAR) 20 MG tablet TAKE 1 TABLET BY MOUTH EVERY DAY 90 tablet 0   OneTouch Delica Lancets 33G MISC Once daily dx e11.9 (give lancects that go with pts meter) (Patient not taking: Reported on 11/12/2022) 100 each 5   ONETOUCH ULTRA test strip USE AS INSTRUCTED (Patient not taking: Reported on 11/12/2022) 100 strip 5   oxyCODONE-acetaminophen (PERCOCET) 5-325 MG tablet Take 1 tablet by mouth every 4 (four) hours as needed for severe pain. 30 tablet 0   penicillin v potassium (VEETID) 500 MG tablet Take 1 tablet (500 mg total) by mouth 3 (three) times daily. (Patient not taking: Reported on 11/12/2022) 15 tablet 0   No current facility-administered medications on file prior to visit.    Labs:  Lab Results  Component Value Date   HGBA1C 7.1 (A) 09/29/2022      Latest Ref Rng & Units 10/01/2022    4:07 PM 09/25/2022    6:07 PM 05/04/2022    3:48 PM  CMP  Glucose 70 - 99 mg/dL 75  48  846   BUN 8 - 27 mg/dL 16  15  14    Creatinine 0.57 - 1.00 mg/dL 9.62  9.52  8.41   Sodium 134 - 144 mmol/L 143  141  141   Potassium 3.5 - 5.2 mmol/L 4.0  3.1  3.8   Chloride 96 - 106 mmol/L 102  103  99   CO2 20 - 29 mmol/L 25  27  24    Calcium 8.7 - 10.3 mg/dL 32.4  9.0  9.8   Total Protein 6.0 - 8.5 g/dL 7.6   7.4   Total Bilirubin 0.0 - 1.2 mg/dL 0.4   0.4   Alkaline Phos 44 - 121 IU/L 117   117   AST 0 - 40 IU/L 15   14   ALT 0 - 32 IU/L 11   14     Notable Signs/Symptoms: weak and low blood sugars at times.  Lifestyle & Dietary Hx Lives by herself.   Estimated daily fluid intake: 30 oz Supplements:   Sleep: Good Stress / self-care:   Current average weekly physical activity: ADL  24-Hr Dietary Recall First Meal: Egg and sausage biscuit, gravy biscuit, Snack:  coffee with little sugar Second Meal: Chicken potpie, unsweet tea Snack:  Third Meal: Hamburger with bun,  water Snack:  Beverages: water  Estimated Energy Needs Calories: 1200-1400 Carbohydrate: 135g Protein: 90g Fat: 33g   NUTRITION DIAGNOSIS  NB-1.1 Food and nutrition-related knowledge deficit As related to DIabetes Type 2.  As evidenced by A1C 7.1%.Marland Kitchen  NUTRITION INTERVENTION  Nutrition education (E-1) on the following topics:  Nutrition and Diabetes education provided on My Plate, CHO counting, meal planning, portion sizes, timing of meals, avoiding snacks between meals unless having a low blood sugar, target ranges for A1C and blood sugars, signs/symptoms and treatment of hyper/hypoglycemia, monitoring blood sugars, taking medications as prescribed, benefits of exercising 30 minutes per day and prevention of complications of DM.  Lifestyle Medicine  - Whole Food, Plant Predominant Nutrition is highly recommended: Eat Plenty of vegetables, Mushrooms, fruits, Legumes, Whole Grains, Nuts, seeds in lieu of processed meats, processed snacks/pastries red meat, poultry, eggs.    -It is better to avoid simple carbohydrates including: Cakes, Sweet Desserts, Ice Cream, Soda (diet and regular), Sweet Tea, Candies, Chips, Cookies, Store Bought Juices, Alcohol in Excess of  1-2 drinks a day, Lemonade,  Artificial Sweeteners, Doughnuts, Coffee Creamers, "Sugar-free" Products, etc, etc.  This is not a complete list.....  Exercise: If you are able: 30 -60 minutes a day ,4 days a week, or 150 minutes a week.  The longer the better.  Combine stretch, strength, and aerobic activities.  If you were told in the past that you have high risk for cardiovascular diseases, you may seek evaluation by your heart doctor prior to initiating moderate to intense exercise programs.   Handouts Provided Include  Meal Plan ideas  Learning Style & Readiness for Change Teaching method utilized: Visual & Auditory  Demonstrated degree of understanding via: Teach Back  Barriers to learning/adherence to lifestyle change:  None  Goals Established by Pt Eat three balanced meals of 30-45 grams of carbs per meal Don't skip meals Eat 4 oz of juice or hard candies if BS drops and recheck BS in 15 minutes. If BS drops 2-3 times per week, may need to reduce the amount of insulin you take at night. Call me next week and we can get your dexcom put on to prevent low blood sugars.   MONITORING & EVALUATION Dietary intake, weekly physical activity, and bs in 1 in 1 week.  Next Steps  Patient is to work on eating three balanced meals and not skipping meals.Marland Kitchen

## 2022-12-09 NOTE — Patient Instructions (Signed)

## 2022-12-09 NOTE — Patient Instructions (Signed)
Eat three balanced meals of 30-45 grams of carbs per meal Don't skip meals Eat 4 oz of juice or hard candies if BS drops and recheck BS in 15 minutes. If BS drops 2-3 times per week, may need to reduce the amount of insulin you take at night. Call me next week and we can get your dexcom put on to prevent low blood sugars.

## 2022-12-10 LAB — URINALYSIS, ROUTINE W REFLEX MICROSCOPIC
Bilirubin, UA: NEGATIVE
Glucose, UA: NEGATIVE
Ketones, UA: NEGATIVE
Nitrite, UA: NEGATIVE
Specific Gravity, UA: 1.03 (ref 1.005–1.030)
Urobilinogen, Ur: 0.2 mg/dL (ref 0.2–1.0)
pH, UA: 6 (ref 5.0–7.5)

## 2022-12-10 LAB — MICROSCOPIC EXAMINATION
RBC, Urine: >30 /[HPF] — AB (ref 0–2)
WBC, UA: >30 /[HPF] — AB (ref 0–5)

## 2022-12-17 LAB — STONE ANALYSIS
Calcium Oxalate Dihydrate: 40 %
Calcium Oxalate Monohydrate: 60 %
Weight Calculi: 21 mg

## 2022-12-20 NOTE — Progress Notes (Signed)
Letter sent.

## 2023-01-04 ENCOUNTER — Telehealth: Payer: Self-pay | Admitting: Family Medicine

## 2023-01-04 NOTE — Telephone Encounter (Signed)
FYI, Patient came into the office said she has ran out of some medicine that was given to her last visit in office by Dr Lodema Hong, patient does not know the name of it but the pharmacy will not refill until November. Patient will go home and see if she can find the name of medicine and contact our office to refill the medicine.

## 2023-01-04 NOTE — Telephone Encounter (Signed)
Patient came by the office and the bottle that ws given to her by our office was Olmesartan Medoxomil Tablets, USP 20 mg  Needs a refill bottle says 30 of 90 days  With 0 refills.  Does patient need to continue this medication ? If so needs a new Prescription. Patient has been out.  Pharmacy [facility]  Please contact the patient at [phone].

## 2023-01-05 ENCOUNTER — Other Ambulatory Visit: Payer: Self-pay

## 2023-01-05 MED ORDER — OLMESARTAN MEDOXOMIL 20 MG PO TABS: 20.0000 mg | ORAL_TABLET | Freq: Every day | ORAL | 0 refills | Status: DC

## 2023-01-05 NOTE — Telephone Encounter (Signed)
Refill sent we do not have samples of this medication here at our office if patient lost medication she would have to pay cash at her pharmacy

## 2023-01-06 ENCOUNTER — Ambulatory Visit: Payer: 59 | Admitting: Family Medicine

## 2023-01-07 ENCOUNTER — Ambulatory Visit: Payer: 59 | Admitting: Family Medicine

## 2023-02-09 ENCOUNTER — Telehealth: Payer: Self-pay

## 2023-02-09 NOTE — Progress Notes (Unsigned)
Name: [name] DOB: February 03, 1951 MRN: [id number]  History of Present Illness: [name] is a 72 y.o. female who presents today for follow up visit at [facility] [place]. - GU History: 1. Kidney stones. - 11/23/2022 : Underwent right ESWL procedure by Dr. Ronne Binning. - 12/09/2022: Stone analysis showed 100% calcium oxalate composition. 2. Recurrent UTI.  Urine culture results in past 12 months: - 05/03/2022: Positive for Enterococcus faecalis - 09/25/2022: Negative - 10/04/2022: Positive for Enterococcus faecalis - 11/01/2022: Positive for Enterococcus faecalis - 11/18/2022: Positive for Enterococcus faecalis  Today: She reports suspected UTI. Reports that over past 1-2 months she has been having intermittently increased urinary urgency, frequency, nocturia, and dysuria. States it has been somewhat improved recently. Denies gross hematuria, hesitancy, straining to void, or sensations of incomplete emptying.  She denies going to get her postop KUB following her right ESWL procedure on 11/23/2022 due to financial concerns. Denies flank pain or abdominal pain. She thinks she may have passed a small stone recently but isn't sure. She denies fevers, nausea, or vomiting.   Fall Screening: Do you usually have a device to assist in your mobility? No   Medications: Current Outpatient Medications  Medication Sig Dispense Refill   ACCU-CHEK GUIDE test strip 1 EACH BY IN VITRO ROUTE IN THE MORNING, AT NOON, AND AT BEDTIME. MAY SUBSTITUTE TO ANY MANUFACTURER COVERED BY PATIENT'S INSURANCE. 100 strip 0   acetaminophen (TYLENOL) 500 MG tablet Take 1,000 mg by mouth every 6 (six) hours as needed for headache or moderate pain.     amLODipine (NORVASC) 5 MG tablet TAKE 1 TABLET (5 MG TOTAL) BY MOUTH DAILY. 90 tablet 0   aspirin EC 81 MG tablet Take 81 mg by mouth daily.     atorvastatin (LIPITOR) 10 MG tablet TAKE ONE TABLET BY MOUTH EVERY MON, WED, AND FRI, AND HALF A TABLET TUES,  THURS, SAT, AND SUN (Patient taking differently: Take 5-10 mg by mouth See admin instructions. TAKE ONE TABLET BY MOUTH EVERY MON, WED, AND FRI, AND HALF A TABLET TUES, THURS, SAT, AND SUN) 60 tablet 3   B-D ULTRAFINE III SHORT PEN 31G X 8 MM MISC FOR USE WITH INSULIN ONCE DAILY DX E11.9 100 each 5   Blood Glucose Monitoring Suppl (ACCU-CHEK GUIDE ME) w/Device KIT 1 EACH BY DOES NOT APPLY ROUTE IN THE MORNING, AT NOON, AND AT BEDTIME. MAY SUBSTITUTE TO ANY MANUFACTURER COVERED BY PATIENT'S INSURANCE. 1 kit 0   Cholecalciferol (VITAMIN D3) 50 MCG (2000 UT) TABS Take 2,000 Units by mouth daily.     Continuous Glucose Receiver (DEXCOM G7 RECEIVER) DEVI Use to check blood sugar daily. DX : E11.69 1 each 0   Continuous Glucose Sensor (DEXCOM G7 SENSOR) MISC Use to check blood sugar daily. DX: E11.69 1 each 3   estradiol (ESTRACE) 0.1 MG/GM vaginal cream Discard plastic applicator. Insert a blueberry size amount (approximately 1 gram) of cream on fingertip inside vagina at bedtime every night for 1 week then every other night. For long term use. 30 g 3   ferrous sulfate 325 (65 FE) MG EC tablet TAKE 1 TABLET BY MOUTH TWICE A DAY WITH MEALS 180 tablet 1   glucose 4 GM chewable tablet Chew 1 tablet by mouth as needed for low blood sugar.     insulin glargine (LANTUS SOLOSTAR) 100 UNIT/ML Solostar Pen Inject 30 Units into the skin at bedtime. 15 mL 5   meclizine (ANTIVERT) 12.5 MG tablet Take 1 tablet (12.5 mg  total) by mouth 3 (three) times daily as needed for dizziness. 30 tablet 0   metFORMIN (GLUCOPHAGE) 1000 MG tablet TAKE 1 TABLET BY MOUTH TWICE A DAY WITH FOOD 180 tablet 1   nitrofurantoin (MACRODANTIN) 50 MG capsule Take 1 capsule daily for UTI prevention. Start this 1 day after completing acute course of antibiotic (Macrobid (Nitrofurantoin) 100 mg 2x/day x7 days). 30 capsule 2   nitrofurantoin, macrocrystal-monohydrate, (MACROBID) 100 MG capsule Take 1 capsule (100 mg total) by mouth 2 (two) times  daily for 7 days. 14 capsule 0   olmesartan (BENICAR) 20 MG tablet Take 1 tablet (20 mg total) by mouth daily. 90 tablet 0   OneTouch Delica Lancets 33G MISC Once daily dx e11.9 (give lancects that go with pts meter) 100 each 5   ONETOUCH ULTRA test strip USE AS INSTRUCTED 100 strip 5   No current facility-administered medications for this visit.    Allergies: Allergies  Allergen Reactions   Onglyza [Saxagliptin Hydrochloride] Swelling    Tongue swell   Pravastatin Other (See Comments)    Muscle aches   Ace Inhibitors Cough    Past Medical History:  Diagnosis Date   BACK PAIN, THORACIC REGION, RIGHT 01/27/2010   Qualifier: Diagnosis of  By: Lodema Hong MD, Gwendalyn Ege of foot 07/15/2017   Dysrhythmia    Essential hypertension    Headache(784.0)    Hematuria 02/24/2016   History of kidney stones    Hyperlipidemia    Irregular heart beats    Microscopic hematuria 02/24/2016   Neck pain on right side 01/23/2013   Piles (hemorrhoids) 06/22/2011   Renal calculus, right 05/31/2016   Shoulder pain, right 01/25/2014   SKIN TAG 06/13/2008   Qualifier: Diagnosis of  By: Lodema Hong MD, Margaret     TIA (transient ischemic attack) 06/19/2016   Type 2 diabetes mellitus (HCC)    Vaginitis    Past Surgical History:  Procedure Laterality Date   COLONOSCOPY N/A 10/26/2018   Procedure: COLONOSCOPY;  Surgeon: Malissa Hippo, MD;  [facility];  Service: Endoscopy;  Laterality: N/A;  830-10:30am   COLONOSCOPY WITH PROPOFOL N/A 09/17/2022   Procedure: COLONOSCOPY WITH PROPOFOL;  Surgeon: Dolores Frame, MD;  Location: [facility];  Service: Gastroenterology;  Laterality: N/A;  8:30AM;ASA 1-2   cyst removed Right 1998   cyst- removed from right wrist   CYSTOSCOPY WITH RETROGRADE PYELOGRAM, URETEROSCOPY AND STENT PLACEMENT Right 05/19/2020   Procedure: CYSTOSCOPY WITH RETROGRADE PYELOGRAM, URETEROSCOPY AND STENT PLACEMENT;  Surgeon: Malen Gauze, MD;   Location: [facility];  Service: Urology;  Laterality: Right;   CYSTOSCOPY WITH RETROGRADE PYELOGRAM, URETEROSCOPY AND STENT PLACEMENT Right 06/26/2020   Procedure: CYSTOSCOPY WITH RETROGRADE PYELOGRAM, URETEROSCOPY AND STENT EXCHANGE;  Surgeon: Malen Gauze, MD;  Location: [facility];  Service: Urology;  Laterality: Right;   EXTRACORPOREAL SHOCK WAVE LITHOTRIPSY Right 05/29/2018   Procedure: EXTRACORPOREAL SHOCK WAVE LITHOTRIPSY (ESWL);  Surgeon: Ihor Gully, MD;  Location: [facility];  Service: Urology;  Laterality: Right;   EXTRACORPOREAL SHOCK WAVE LITHOTRIPSY Right 11/23/2022   Procedure: EXTRACORPOREAL SHOCK WAVE LITHOTRIPSY (ESWL);  Surgeon: Malen Gauze, MD;  Location: [facility];  Service: Urology;  Laterality: Right;   HOLMIUM LASER APPLICATION Right 06/26/2020   Procedure: HOLMIUM LASER APPLICATION;  Surgeon: Malen Gauze, MD;  Location: [facility];  Service: Urology;  Laterality: Right;   lithotrpsy     over 20 years   NEPHROLITHOTOMY Right 05/31/2016   Procedure: NEPHROLITHOTOMY PERCUTANEOUS  WITH SURGEON ACCESS;  Surgeon: Malen Gauze, MD;  Location: [facility];  Service: Urology;  Laterality: Right;   POLYPECTOMY  10/26/2018   Procedure: POLYPECTOMY;  Surgeon: Malissa Hippo, MD;  [facility];  Service: Endoscopy;;  colon   POLYPECTOMY  09/17/2022   Procedure: POLYPECTOMY;  Surgeon: Dolores Frame, MD;  [facility]: [facility];  Service: Gastroenterology;;   TOTAL ABDOMINAL HYSTERECTOMY W/ BILATERAL SALPINGOOPHORECTOMY  1998   TUBAL LIGATION     Family History  Problem Relation Age of Onset   Alcohol abuse Mother    Esophageal cancer Father    Alcohol abuse Father    Diabetes Sister    Diabetes Sister    Hypertension Sister    Heart attack Sister        in 65's   Seizures Brother    Social History   Socioeconomic History   Marital status: Single    Spouse name: Not on file   Number of children: 1   Years of education: Not on file   Highest  education level: 12th grade  Occupational History   Occupation: employed   Tobacco Use   Smoking status: Never   Smokeless tobacco: Never  Vaping Use   Vaping status: Never Used  Substance and Sexual Activity   Alcohol use: No   Drug use: No   Sexual activity: Not Currently  Other Topics Concern   Not on file  Social History Narrative   Not on file   Social Determinants of Health   Financial Resource Strain: Low Risk  (02/16/2022)   Overall Financial Resource Strain (CARDIA)    Difficulty of Paying Living Expenses: Not hard at all  Food Insecurity: No Food Insecurity (02/16/2022)   Hunger Vital Sign    Worried About Running Out of Food in the Last Year: Never true    Ran Out of Food in the Last Year: Never true  Transportation Needs: No Transportation Needs (02/16/2022)   PRAPARE - Administrator, Civil Service (Medical): No    Lack of Transportation (Non-Medical): No  Physical Activity: Sufficiently Active (02/16/2022)   Exercise Vital Sign    Days of Exercise per Week: 3 days    Minutes of Exercise per Session: 50 min  Stress: No Stress Concern Present (02/16/2022)   [employer] of Occupational Health - Occupational Stress Questionnaire    Feeling of Stress : Only a little  Social Connections: Moderately Isolated (02/16/2022)   Social Connection and Isolation Panel [NHANES]    Frequency of Communication with Friends and Family: Twice a week    Frequency of Social Gatherings with Friends and Family: Three times a week    Attends Religious Services: 1 to 4 times per year    Active Member of Clubs or Organizations: No    Attends Banker Meetings: Never    Marital Status: Separated  Intimate Partner Violence: Not At Risk (02/16/2022)   Humiliation, Afraid, Rape, and Kick questionnaire    Fear of Current or Ex-Partner: No    Emotionally Abused: No    Physically Abused: No    Sexually Abused: No    SUBJECTIVE  Review of  Systems Constitutional: Patient denies any unintentional weight loss or change in strength lntegumentary: Patient denies any rashes or pruritus Cardiovascular: Patient denies chest pain or syncope Respiratory: Patient denies shortness of breath Gastrointestinal: Patient denies nausea, vomiting, constipation, or diarrhea Musculoskeletal: Patient denies muscle cramps or weakness Neurologic: Patient denies convulsions or  seizures Allergic/Immunologic: Patient denies recent allergic reaction(s) Hematologic/Lymphatic: Patient denies bleeding tendencies Endocrine: Patient denies heat/cold intolerance  GU: As per HPI.  OBJECTIVE Vitals:   02/10/23 1136  BP: 122/75  Pulse: (!) 101  Temp: 98.5 F (36.9 C)   There is no height or weight on file to calculate BMI.  Physical Examination Constitutional: No obvious distress; patient is non-toxic appearing  Cardiovascular: No visible lower extremity edema.  Respiratory: The patient does not have audible wheezing/stridor; respirations do not appear labored  Gastrointestinal: Abdomen non-distended Musculoskeletal: Normal ROM of UEs  Skin: No obvious rashes/open sores  Neurologic: CN 2-12 grossly intact Psychiatric: Answered questions appropriately with normal affect  Hematologic/Lymphatic/Immunologic: No obvious bruises or sites of spontaneous bleeding  UA: >30 WBC/hpf, 11-30 RBC/hpf, few bacteria, calcium oxalate crystals present  PVR: 0 ml  ASSESSMENT Recurrent UTI - Plan: Urinalysis, Routine w reflex microscopic, BLADDER SCAN AMB NON-IMAGING, nitrofurantoin, macrocrystal-monohydrate, (MACROBID) 100 MG capsule, estradiol (ESTRACE) 0.1 MG/GM vaginal cream, nitrofurantoin (MACRODANTIN) 50 MG capsule, Urine culture  Nephrolithiasis - Plan: Urinalysis, Routine w reflex microscopic, BLADDER SCAN AMB NON-IMAGING, Urine culture  Abnormal UA. Will check urine culture and treat empirically with Macrobid (Nitrofurantoin) 100 mg 2x/day x7 days while  awaiting culture results and sensitivities.   We discussed the possible etiologies of recurrent UTls including ascending infection related to intercourse; vaginal atrophy; transmural infection that has been treated incompletely; urinary tract stones; incomplete bladder emptying with urinary stasis; kidney or bladder tumor; urethral diverticulum; and colonization of  vagina and urinary tract with pathologic, adherent organisms.   For UTI prevention we discussed options including: Maintain adequate fluid intake daily to flush out the urinary tract. - Go to the bathroom to urinate every 4-6 hours while awake to minimize urinary stasis / bacterial overgrowth in the bladder. - Proanthocyanidin (PAC) supplement 36 mg daily; must be soluble (insoluble form of PAC will be ineffective). Recommend Ellura. D-mannose 2 g daily Vitamin C supplement Probiotic to maintain healthy vaginal microbiome - Topical vaginal estrogen for vaginal atrophy. The etiology and consequences of urogenital epithelial atrophy was explained to patient. The thinning of the epithelium of the urethra can contribute to urinary urgency and frequency syndromes. In addition, the normal bacterial flora that colonizes the perineum may contribute to UTI risk because the thin urethral epithelium allows the bacteria to become adherent and the change in vaginal pH can disrupt the vaginal / urethral microbiome and allow for bacterial overgrowth. Patient was advised that topical vaginal estrogen replacement will take about 3 months to restore the vaginal pH and may sting/burn initially due to severe dryness, which will improve with ongoing treatment. OK to have sex with any of the topical vaginal estrogen replacement options.  - UTI prophylaxis with a daily low dose antibiotic. We discussed the potential risks of prolonged antibiotic treatment particularly with the risks of developing antibiotic resistance.   Ultimately for UTI prevention we agreed to  start Macrodantin 50 mg daily for the next 3 months and topical vaginal estrogen cream (for long term use). Macrodantin (Nitrofurantoin) was selected based on prior urine culture sensitivity results. We discussed the risk for pulmonary fibrosis with daily low dose Nitrofurantoin use however we agreed that the potential benefit of short term use (3 months) to address her bladder colonization and recurrent UTIs outweighs the risk for developing pulmonary fibrosis in the time frame. She also agreed to consider OTC supplements for UTI prevention. Handout provided.  Will plan to follow up in 3 months with KUB  prior to that visit for stone surveillance. Pt verbalized understanding and agreement. All questions were answered.  PLAN Advised the following: Urine culture. Macrobid (Nitrofurantoin) 100 mg 2x/day x7 days 3. Macrodantin (Nitrofurantoin) 50 mg daily for UTI prevention (start 1 day after completing 7 day course of Macrobid. 4. Start topical vaginal estrogen cream as prescribed. 5. Maintain adequate fluid intake daily to flush out the urinary tract. 6. Urinate every 4-6 hours while awake to minimize urinary stasis / bacterial overgrowth in the bladder. 7. Consider OTC supplements for UTI prevention. 8. Return in 3 months (on 05/11/2023) for UA, PVR, & f/u with Evette Georges NP for stone & rUTI; needs KUB prior to visit.  Orders Placed This Encounter  Procedures   Urine culture   Urinalysis, Routine w reflex microscopic   BLADDER SCAN AMB NON-IMAGING    It has been explained that the patient is to follow regularly with their PCP in addition to all other providers involved in their care and to follow instructions provided by these respective offices. Patient advised to contact urology clinic if any urologic-pertaining questions, concerns, new symptoms or problems arise in the interim period.  Patient Instructions  Recommendations regarding UTI prevention / management:  Options when UTI  symptoms occur: 1. Call [facility] [place] to request urgent / same-day visit (phone # [phone]).  2. Call your Primary Care Provider (PCP) office to request urgent / same-day visit. Be sure to request for urine culture to be ordered and have results faxed to Urology (fax # [phone]).  3. Go to urgent care. Be sure to request for urine culture to be ordered and have results faxed to Urology (fax # [phone]).   For bladder pain/ burning with urination: - Can take OTC Pyridium (phenazopyridine; commonly known under the "AZO" brand) for a few days as needed. Limit use to no more than 3 days consecutively due to risk for methemoglobinemia, liver function issues, and bone health damage with long term use of Pyridium.  Routine use for UTI prevention: - Low dose antibiotic daily for UTI prophylaxis. - Topical vaginal estrogen for vaginal atrophy. - Adequate daily fluid intake to flush out the urinary tract. - Go to the bathroom to urinate every 4-6 hours while awake to minimize urinary stasis / bacterial overgrowth in the bladder. - Proanthocyanidin (PAC) supplement 36 mg daily; must be soluble (insoluble form of PAC will be ineffective). Recommended brand: Ellura. This is an over-the-counter supplement (often must be found/ purchased online) supplement derived from cranberries with concentrated active component: Proanthocyanidin (PAC) 36 mg daily. Decreases bacterial adherence to bladder lining.  - D-mannose powder (2 grams daily). This is an over-the-counter supplement which decreases bacterial adherence to bladder lining (it is a sugar that inhibits bacterial adherence to urothelial cells by binding to the pili of enteric bacteria). Take as per manufacturer recommendation. Can be used as an alternative or in addition to the concentrated cranberry supplement.  - Vitamin C supplement to acidify urine to minimize bacterial growth.  - Probiotic to maintain healthy vaginal microbiome  to suppress bacteria at urethral opening. Brand recommendations: Darrold Junker (includes probiotic & D-mannose ), Feminine Balance (highest concentration of lactobacillus) or Hyperbiotic Pro 15.  Note for patients with diabetes:  - Be aware that D-mannose contains sugar.  Note for patients with interstitial cystitis (IC):  - Patients with IC should typically avoid cranberry/ PAC supplements and Vitamin C supplements due to their acidity, which may exacerbate IC-related bladder pain. - Symptoms of true bacterial  UTI can overlap / mimic symptoms of an IC flare up. Antibiotic use is NOT indicated for IC flare ups. Urine culture needed prior to antibiotic treatment for IC patients. The goal is to minimize your risk for developing antibiotic-resistant bacteria.    Vulvovaginal atrophy I Genitourinary syndrome of menopause (GSM):  What it is: Changes in the vaginal environment (including the vulva and urethra) including: Thinning of the epithelium (skin/ mucosa surface) Can contribute to urinary urgency and frequency Can contribute to dryness, itching, irritation of the vulvar and vaginal tissue Can contribute to pain with intercourse Can contribute to physical changes of the labia, vulva, and vagina such as: Narrowing of the vaginal opening Decreased vaginal length Loss of labial architecture Labial adhesions Pale color of vulvovaginal tissue  Loss of pubic hair Allows bacteria to become adherent  Results in increased risk for urinary tract infection (UTI) due to bacterial overgrowth and migration up the urethra into the bladder Change in vaginal pH (acid/ base balance) Allows for alteration / disruption of the normal bacterial flora / microbiome Results in increased risk for urinary tract infection (UTI) due to bacterial overgrowth  Treatment options: Over-the-counter lubricants (see list below). Prescription vaginal estrogen replacement. Options: Topical vaginal estrogen cream Estrace,  Premarin, or compounded estradiol cream/ gel We advise: Discard plastic applicator as that tends to use more medication than you need, which is not harmful but wastes / uses up the medication. Also the plastic applicator may cause discomfort. Insert blueberry size amount of medication via the tip of your finger inside vagina nightly for 1 week then 2-3 times per week (long term). Estring vaginal ring Exchanged every 3 months (either at home or in office by provider) Vagifem vaginal tablet Inserted nightly for 2 weeks then twice a week (long term) lntrarosa vaginal suppository Vaginal DHEA: converts to estrogen in vaginal tissue without systemic effect Inserted nightly (long term) Vaginal laser therapy (Mona Lisa touch) Performed in 3 treatments each 6 weeks apart (available in our [place] office). Can feel like a sunburn for 3-4 days after each treatment until new skin heals in. Usually not covered by insurance. Estimated cost is $1500 for all 3 sessions.  FYI regarding prescription vaginal estrogen treatment options: All topical vaginal estrogen replacement options are equivalent in terms of efficacy. Topical vaginal estrogen replacement will take about 3 months to be effective. OK to have sex with any of the topical vaginal estrogen replacement options. Topical vaginal estrogen replacement may sting/burn initially due to severe dryness, which will improve with ongoing treatment. There have been studies that evaluate use of low-dose intravaginal estrogen that show minimal systemic absorption which is negligible after 3 weeks. There have been no studies indicating increased risk of contributing to cancer development or recurrence.  Topical vaginal estrogen cream safe to use with breast cancer history [email or web address]  Topical vaginal estrogen cream safe to use with blood clot  history [email or web address]   Lubricants and Moisturizers for Treating Genitourinary Syndrome of Menopause and Vulvovaginal Atrophy Treatment Comments I Available Products   Lubricants   Water-based Ingredients: Deionized water, glycerin, propylene glycol; latex safe; rare irritation; dry out with extended sexual activity Astroglide, Good Clean Love, K-Y Jelly, Natural, Organic, Pink, Sliquid, Sylk, Yes    Oil Based Ingredients: avocado, olive, peanut, corn; latex safe; can be used with silicone products; staining; safe (unless peanut allergy); non-irritating Coconut oil, vegetable oil, vitamin E oil  Silicone-Based Ingredients: Silicone polymers; staining; typically nonirritating, long lasting; waterproof; should not be used with silicone  dilators, sexual toys, or gynecologic products Astroglide X, Oceanus Ultra Pure, Pink Silicone, Pjur Eros, Replens Silky Smooth, Silicone Premium JO, SKYN, Uberlube, [employer] Based Minimize harm to sperm motility; designed Astroglide TTC, Conceive Plus, Pre for couples trying to conceive Seed, Yes Baby  Fertility Friendly Minimize harm to sperm motility; designed Astroglide, TTC, Conceive Plus, Pre for couples trying to conceive Seed, Yes Baby  Vaginal Moisturizers   Vaginal Moisturizers For maintenance use 1 to 3 times weekly; can benefit women with dryness, chafing with AOL, and recurrent vaginal infections irrespective of sexual activity timing Balance Active Menopause Vaginal Moisturizing Lubricant, Canesintima Intimate Moisturizer, Replens, Rephresh, Sylk Natural Intimate Moisturizer, Yes Vaginal Moisturizer  Hybrids Properties of both water and silicone-based products (combination of a vaginal lubricant and moisturizer); Non-irritating; good option for women with allergies and  sensitivities Lubrigyn, Luvena  Suppositories Hyaluronic acid to retain moisture Revaree  Vulvar Soothing Creams/Oils    Medicated CreamsP ain and burn relief; Ingredients: 4% Lidocaine, Aloe Vera gel Releveum ([place])  Non-Medicated Creams For anti-itch and moisture/maintenance; Ingredients: Coconut oil, Avocado oil, Shea Butter, Olive oil, Vitamin E Vajuvenate, Vmagic  Oils !For moisture/maintenance !Coconut oil, Vitamin E oil, Emu oil     Electronically signed by:  Donnita Falls, MSN, FNP-C, CUNP 02/10/2023 12:36 PM

## 2023-02-09 NOTE — Telephone Encounter (Signed)
Patient called in today about UTI sxs. Patient states she has been dealing with UTI, pain when voiding for awhile. Patient was offer an appointment with NP and patient has been scheduled. Patient voiced understanding.

## 2023-02-10 ENCOUNTER — Ambulatory Visit: Payer: 59 | Admitting: Urology

## 2023-02-10 ENCOUNTER — Encounter: Payer: Self-pay | Admitting: Urology

## 2023-02-10 VITALS — BP 122/75 | HR 101 | Temp 98.5°F

## 2023-02-10 DIAGNOSIS — N200 Calculus of kidney: Secondary | ICD-10-CM | POA: Diagnosis not present

## 2023-02-10 DIAGNOSIS — N390 Urinary tract infection, site not specified: Secondary | ICD-10-CM

## 2023-02-10 DIAGNOSIS — R350 Frequency of micturition: Secondary | ICD-10-CM

## 2023-02-10 DIAGNOSIS — Z87440 Personal history of urinary (tract) infections: Secondary | ICD-10-CM | POA: Diagnosis not present

## 2023-02-10 DIAGNOSIS — Z87442 Personal history of urinary calculi: Secondary | ICD-10-CM

## 2023-02-10 DIAGNOSIS — R3915 Urgency of urination: Secondary | ICD-10-CM | POA: Diagnosis not present

## 2023-02-10 DIAGNOSIS — R351 Nocturia: Secondary | ICD-10-CM

## 2023-02-10 DIAGNOSIS — R300 Dysuria: Secondary | ICD-10-CM | POA: Diagnosis not present

## 2023-02-10 MED ORDER — NITROFURANTOIN MACROCRYSTAL 50 MG PO CAPS
ORAL_CAPSULE | ORAL | 2 refills | Status: DC
Start: 2023-02-10 — End: 2023-05-12

## 2023-02-10 MED ORDER — ESTRADIOL 0.1 MG/GM VA CREA: TOPICAL_CREAM | VAGINAL | 3 refills | Status: DC

## 2023-02-10 MED ORDER — NITROFURANTOIN MONOHYD MACRO 100 MG PO CAPS
100.0000 mg | ORAL_CAPSULE | Freq: Two times a day (BID) | ORAL | 0 refills | Status: AC
Start: 2023-02-10 — End: 2023-02-17

## 2023-02-10 NOTE — Patient Instructions (Signed)
Recommendations regarding UTI prevention / management:  Options when UTI symptoms occur: 1. Call [facility] to request urgent / same-day visit (phone # [phone]).  2. Call your Primary Care Provider (PCP) office to request urgent / same-day visit. Be sure to request for urine culture to be ordered and have results faxed to Urology (fax # [phone]).  3. Go to urgent care. Be sure to request for urine culture to be ordered and have results faxed to Urology (fax # [phone]).   For bladder pain/ burning with urination: - Can take OTC Pyridium (phenazopyridine; commonly known under the "AZO" brand) for a few days as needed. Limit use to no more than 3 days consecutively due to risk for methemoglobinemia, liver function issues, and bone health damage with long term use of Pyridium.  Routine use for UTI prevention: - Low dose antibiotic daily for UTI prophylaxis. - Topical vaginal estrogen for vaginal atrophy. - Adequate daily fluid intake to flush out the urinary tract. - Go to the bathroom to urinate every 4-6 hours while awake to minimize urinary stasis / bacterial overgrowth in the bladder. - Proanthocyanidin (PAC) supplement 36 mg daily; must be soluble (insoluble form of PAC will be ineffective). Recommended brand: Ellura. This is an over-the-counter supplement (often must be found/ purchased online) supplement derived from cranberries with concentrated active component: Proanthocyanidin (PAC) 36 mg daily. Decreases bacterial adherence to bladder lining.  - D-mannose powder (2 grams daily). This is an over-the-counter supplement which decreases bacterial adherence to bladder lining (it is a sugar that inhibits bacterial adherence to urothelial cells by binding to the pili of enteric bacteria). Take as per manufacturer recommendation. Can be used as an alternative or in addition to the concentrated cranberry supplement.  - Vitamin C supplement to acidify urine to  minimize bacterial growth.  - Probiotic to maintain healthy vaginal microbiome to suppress bacteria at urethral opening. Brand recommendations: Darrold Junker (includes probiotic & D-mannose ), Feminine Balance (highest concentration of lactobacillus) or Hyperbiotic Pro 15.  Note for patients with diabetes:  - Be aware that D-mannose contains sugar.  Note for patients with interstitial cystitis (IC):  - Patients with IC should typically avoid cranberry/ PAC supplements and Vitamin C supplements due to their acidity, which may exacerbate IC-related bladder pain. - Symptoms of true bacterial UTI can overlap / mimic symptoms of an IC flare up. Antibiotic use is NOT indicated for IC flare ups. Urine culture needed prior to antibiotic treatment for IC patients. The goal is to minimize your risk for developing antibiotic-resistant bacteria.    Vulvovaginal atrophy I Genitourinary syndrome of menopause (GSM):  What it is: Changes in the vaginal environment (including the vulva and urethra) including: Thinning of the epithelium (skin/ mucosa surface) Can contribute to urinary urgency and frequency Can contribute to dryness, itching, irritation of the vulvar and vaginal tissue Can contribute to pain with intercourse Can contribute to physical changes of the labia, vulva, and vagina such as: Narrowing of the vaginal opening Decreased vaginal length Loss of labial architecture Labial adhesions Pale color of vulvovaginal tissue Loss of pubic hair Allows bacteria to become adherent  Results in increased risk for urinary tract infection (UTI) due to bacterial overgrowth and migration up the urethra into the bladder Change in vaginal pH (acid/ base balance) Allows for alteration / disruption of the normal bacterial flora / microbiome Results in increased risk for urinary tract infection (UTI) due to bacterial overgrowth  Treatment options: Over-the-counter lubricants (see list below). Prescription  vaginal  estrogen replacement. Options: Topical vaginal estrogen cream Estrace, Premarin, or compounded estradiol cream/ gel We advise: Discard plastic applicator as that tends to use more medication than you need, which is not harmful but wastes / uses up the medication. Also the plastic applicator may cause discomfort. Insert blueberry size amount of medication via the tip of your finger inside vagina nightly for 1 week then 2-3 times per week (long term). Estring vaginal ring Exchanged every 3 months (either at home or in office by provider) Vagifem vaginal tablet Inserted nightly for 2 weeks then twice a week (long term) lntrarosa vaginal suppository Vaginal DHEA: converts to estrogen in vaginal tissue without systemic effect Inserted nightly (long term) Vaginal laser therapy (Mona Lisa touch) Performed in 3 treatments each 6 weeks apart (available in our [place] office). Can feel like a sunburn for 3-4 days after each treatment until new skin heals in. Usually not covered by insurance. Estimated cost is $1500 for all 3 sessions.  FYI regarding prescription vaginal estrogen treatment options: All topical vaginal estrogen replacement options are equivalent in terms of efficacy. Topical vaginal estrogen replacement will take about 3 months to be effective. OK to have sex with any of the topical vaginal estrogen replacement options. Topical vaginal estrogen replacement may sting/burn initially due to severe dryness, which will improve with ongoing treatment. There have been studies that evaluate use of low-dose intravaginal estrogen that show minimal systemic absorption which is negligible after 3 weeks. There have been no studies indicating increased risk of contributing to cancer development or recurrence.  Topical vaginal estrogen cream safe to use with breast cancer history [email or web address]  Topical  vaginal estrogen cream safe to use with blood clot history [email or web address]   Lubricants and Moisturizers for Treating Genitourinary Syndrome of Menopause and Vulvovaginal Atrophy Treatment Comments I Available Products   Lubricants   Water-based Ingredients: Deionized water, glycerin, propylene glycol; latex safe; rare irritation; dry out with extended sexual activity Astroglide, Good Clean Love, K-Y Jelly, Natural, Organic, Pink, Sliquid, Sylk, Yes    Oil Based Ingredients: avocado, olive, peanut, corn; latex safe; can be used with silicone products; staining; safe (unless peanut allergy); non-irritating Coconut oil, vegetable oil, vitamin E oil  Silicone-Based Ingredients: Silicone polymers; staining; typically nonirritating, long lasting; waterproof; should not be used with silicone dilators, sexual toys, or gynecologic products Astroglide X, Oceanus Ultra Pure, Pink Silicone, Pjur Eros, Replens Silky Smooth, Silicone Premium JO, SKYN, Uberlube, [employer] Based Minimize harm to sperm motility; designed Astroglide TTC, Conceive Plus, Pre for couples trying to conceive Seed, Yes Baby  Fertility Friendly Minimize harm to sperm motility; designed Astroglide, TTC, Conceive Plus, Pre for couples trying to conceive Seed, Yes Baby  Vaginal Moisturizers   Vaginal Moisturizers For maintenance use 1 to 3 times weekly; can benefit women with dryness, chafing with AOL, and recurrent vaginal infections irrespective of sexual activity timing Balance Active Menopause Vaginal Moisturizing Lubricant, Canesintima Intimate Moisturizer, Replens, Rephresh, Sylk Natural Intimate Moisturizer, Yes Vaginal Moisturizer  Hybrids Properties of both water and silicone-based products (combination of a vaginal lubricant and moisturizer);  Non-irritating; good option for women with allergies and sensitivities Lubrigyn, Luvena  Suppositories Hyaluronic acid to retain moisture Revaree  Vulvar Soothing Creams/Oils    Medicated CreamsP ain and burn relief; Ingredients: 4% Lidocaine, Aloe Vera gel Releveum ([place])  Non-Medicated Creams For anti-itch and moisture/maintenance; Ingredients: Coconut oil, Avocado oil, Shea Butter, Olive oil, Vitamin E Vajuvenate, Vmagic  Oils !For moisture/maintenance !Coconut oil, Vitamin  E oil, Emu oil

## 2023-02-11 LAB — MICROSCOPIC EXAMINATION: WBC, UA: >30 /[HPF] — AB (ref 0–5)

## 2023-02-11 LAB — URINALYSIS, ROUTINE W REFLEX MICROSCOPIC
Bilirubin, UA: NEGATIVE
Glucose, UA: NEGATIVE
Ketones, UA: NEGATIVE
Nitrite, UA: NEGATIVE
Specific Gravity, UA: 1.015 (ref 1.005–1.030)
Urobilinogen, Ur: 0.2 mg/dL (ref 0.2–1.0)
pH, UA: 7 (ref 5.0–7.5)

## 2023-02-12 LAB — URINE CULTURE

## 2023-02-14 ENCOUNTER — Telehealth: Payer: Self-pay

## 2023-02-14 NOTE — Telephone Encounter (Signed)
-----   Message from [name] sent at 02/14/2023  8:44 AM EST ----- Please notify patient: Negative urine culture, no antibiotic needed at this time. Thanks.

## 2023-02-14 NOTE — Telephone Encounter (Signed)
Patient is made aware and voiced understanding. 

## 2023-03-04 ENCOUNTER — Ambulatory Visit: Payer: 59 | Admitting: Family Medicine

## 2023-03-11 ENCOUNTER — Other Ambulatory Visit (HOSPITAL_COMMUNITY): Payer: Self-pay | Admitting: Family Medicine

## 2023-03-11 DIAGNOSIS — Z1231 Encounter for screening mammogram for malignant neoplasm of breast: Secondary | ICD-10-CM

## 2023-03-18 ENCOUNTER — Other Ambulatory Visit: Payer: Self-pay | Admitting: Family Medicine

## 2023-03-18 NOTE — Telephone Encounter (Signed)
 Copied from CRM [id number]. Topic: Clinical - Medication Refill >> Mar 18, 2023  2:11 PM Benton KIDD wrote: Most Recent Primary Care Visit:  Provider: ANTONETTA ROLLENE BRAVO  Department: [facility]  Visit Type: OFFICE VISIT  Date: 11/12/2022  Medication: ***  Has the patient contacted their pharmacy?  (Agent: If no, request that the patient contact the pharmacy for the refill. If patient does not wish to contact the pharmacy document the reason why and proceed with request.) (Agent: If yes, when and what did the pharmacy advise?)  Is this the correct pharmacy for this prescription?  If no, delete pharmacy and type the correct one.  This is the patient's preferred pharmacy:  [id number] - [place], [place] - [address] AT [facility] [address] [place] [address] Phone: [phone] Fax: [phone]  [facility] [phone] - [place], [place] - [address] AT [facility] [address] [place] [address] Phone: [phone] Fax: [phone]   Has the prescription been filled recently?   Is the patient out of the medication?   Has the patient been seen for an appointment in the last year OR does the patient have an upcoming appointment?   Can we respond through MyChart?   Agent: Please be advised that Rx refills may take up to 3 business days. We ask that you follow-up with your pharmacy.

## 2023-03-24 ENCOUNTER — Ambulatory Visit (HOSPITAL_COMMUNITY)
Admission: RE | Admit: 2023-03-24 | Discharge: 2023-03-24 | Disposition: A | Discharge status: Home / Self Care | Payer: Medicare HMO | Source: Ambulatory Visit | Attending: Family Medicine | Admitting: Family Medicine

## 2023-03-24 ENCOUNTER — Encounter (HOSPITAL_COMMUNITY): Payer: Self-pay

## 2023-03-24 DIAGNOSIS — Z1231 Encounter for screening mammogram for malignant neoplasm of breast: Secondary | ICD-10-CM | POA: Diagnosis not present

## 2023-03-31 ENCOUNTER — Other Ambulatory Visit: Payer: Self-pay | Admitting: Family Medicine

## 2023-03-31 DIAGNOSIS — Z794 Long term (current) use of insulin: Secondary | ICD-10-CM

## 2023-04-18 ENCOUNTER — Other Ambulatory Visit: Payer: Self-pay | Admitting: Family Medicine

## 2023-04-18 DIAGNOSIS — R42 Dizziness and giddiness: Secondary | ICD-10-CM

## 2023-04-19 ENCOUNTER — Ambulatory Visit: Payer: Self-pay | Admitting: Family Medicine

## 2023-04-19 NOTE — Telephone Encounter (Signed)
Copied from CRM [id number]. Topic: Clinical - Red Word Triage >> Apr 19, 2023  1:39 PM Antwanette L wrote: Red Word that prompted transfer to Nurse Triage: blood sugar is 40. Patient is currently drinking orange juice and eating a cinnamon bun   Chief Complaint: symptomatic hypoglycemia Symptoms: shaking, slurred speech, confusion, "doesn't feel right," weakness - all symptoms resolved for pt within time of phone call Frequency: episodic Pertinent Negatives: Patient sister denies vomiting, LOC, current confusion, current weakness, current slurred speech/confusion/weakness/shaking by end of phone call Disposition: [] 911 / [] ED /[] Urgent Care (no appt availability in office) / [x] Appointment(In office/virtual)/ []  [facility]/ [] Home Care/ [] Refused Recommended Disposition /[] [facility]/ [x]  Follow-up with PCP Additional Notes: Pt sister reporting she called pt "a little after 12 pm and not talking very coherent, kept asking questions and asked if taken blood sugar," got to sister and took blood sugar 5 min ago to reading of 40, pt "shaking, don't feel right," "slow getting her thoughts together," and was needing support from sister to walk. Sister reporting that pt just had "1/4 cup of orange juice and 3/4 cinnamon bun" within last 10 min, pt reporting "feel a little bit better now, gonna take blood sugar again," reading of 75. Pt sister confirms "walking around, at first had to help her, making sense now," confirms no slurred speech. Pt confirms no symptoms. Advised sister stay with pt a few hours to ensure doing okay, monitor blood sugar readings and keep log, call back if symptoms again, call 911 if confusion or slurred speech were to return and persist despite care interventions. Pt and sister verbalized understanding. Scheduled appt with PCP office for 24 hours from now, placing on waitlist, sending HP message for call back to pt if further recommendations and earlier appt if  possible.  Reason for Disposition  [1] Blood glucose 70  mg/dL (3.9 mmol/L) or below OR symptomatic, now improved with Care Advice AND [2] cause unknown  Answer Assessment - Initial Assessment Questions 1. SYMPTOMS: "What symptoms are you concerned about?"     Symptomatic low blood sugar, took 5 min ago 40, called her a little after 12 pm and not talking very coherent, kept asking questions and asked if taken blood sugar, shaking, doesn't feel right, but just got 1/4 cup of orange juice and 3/4 cinnamon bun within last 10 min 2. ONSET:  "When did the symptoms start?"     Not sure, called her a little after 12 pm and not talking very coherent, kept asking questions and asked if taken blood sugar 3. BLOOD GLUCOSE: "What is your blood glucose level?"      40, 5 min ago 5. TYPE 1 or 2:  "Do you know what type of diabetes you have?"  (e.g., Type 1, Type 2, Gestational; doesn't know)      Type 2 6. INSULIN: "Do you take insulin?" "What type of insulin(s) do you use? What is the mode of delivery? (syringe, pen; injection or pump) "When did you last give yourself an insulin dose?" (i.e., time or hours/minutes ago) "How much did you give?" (i.e., how many units)     2 doses per day 7. DIABETES PILLS: "Do you take any pills for your diabetes?" If Yes, ask: "What is the name of the medicine(s) that you take for high blood sugar?"     Took metformin last night 12 am 8. OTHER SYMPTOMS: "Do you have any symptoms?" (e.g., fever, frequent urination, difficulty breathing, vomiting)  Shaking, doesn't feel right, slow getting her thoughts together, (later in phone call) now walking a little bit better 9. LOW BLOOD GLUCOSE TREATMENT: "What have you done so far to treat the low blood glucose level?"     Gave orange juice and some of cinnamon bun, feel a little bit better now, gonna take blood sugar again 11. ALONE: "Are you alone right now or is someone with you?"        Sister with her  Protocols used:  Diabetes - Low Blood Sugar-A-AH

## 2023-04-19 NOTE — Telephone Encounter (Signed)
Appt with Dr Allena Katz 04/20/23

## 2023-04-20 ENCOUNTER — Ambulatory Visit (INDEPENDENT_AMBULATORY_CARE_PROVIDER_SITE_OTHER): Payer: Self-pay | Admitting: Internal Medicine

## 2023-04-20 ENCOUNTER — Encounter: Payer: Self-pay | Admitting: Internal Medicine

## 2023-04-20 VITALS — BP 130/80 | HR 120 | Ht 63.0 in | Wt 133.4 lb

## 2023-04-20 DIAGNOSIS — I10 Essential (primary) hypertension: Secondary | ICD-10-CM | POA: Diagnosis not present

## 2023-04-20 DIAGNOSIS — Z794 Long term (current) use of insulin: Secondary | ICD-10-CM

## 2023-04-20 DIAGNOSIS — E1169 Type 2 diabetes mellitus with other specified complication: Secondary | ICD-10-CM

## 2023-04-20 DIAGNOSIS — E162 Hypoglycemia, unspecified: Secondary | ICD-10-CM

## 2023-04-20 MED ORDER — GVOKE HYPOPEN 2-PACK 1 MG/0.2ML ~~LOC~~ SOAJ: 1.0000 mg | SUBCUTANEOUS | 2 refills | Status: DC | PRN

## 2023-04-20 NOTE — Assessment & Plan Note (Signed)
BP Readings from Last 1 Encounters:  04/20/23 130/80   Well-controlled with amlodipine 5 mg QD and olmesartan 20 mg QD Counseled for compliance with the medications Advised low carb diet and ambulate as tolerated

## 2023-04-20 NOTE — Patient Instructions (Addendum)
Please start taking Lantus 12 Units twice daily instead of 30 Units at bedtime.  Please continue taking Metformin as prescribed.  Please follow small, frequent meals. Please take at least 3 meals in a day.

## 2023-04-20 NOTE — Progress Notes (Signed)
Established Patient Office Visit  Subjective:  Patient ID: [name], female    DOB: 12-25-1950  Age: 73 y.o. MRN: [id number]  CC:  Chief Complaint  Patient presents with   Diabetes    Pt here due to hypoglycemia on yesterday , was feeling jittery, mumbling when talking. Reading was down to 40. Reports feeling well today.     HPI [name] is a 73 y.o. female with past medical history of Ht and, type II DM and HLD who presents for f/u of recent episode of hypoglycemia at home.  Her sister reports that she was talking gibberish yesterday in the afternoon on the phone.  She went to check on the patient and checked her blood glucose, which was 40.  She gave her orange juice and checked her blood glucose after an hour, which was around 70.  Of note, patient has inconsistent dietary pattern.  She takes only 2 meals in a day, around 12 PM and 5 PM and stays awake till 12 AM.  She checks her blood glucose at nighttime, which is usually around 100s-120s, but she feels fatigued in the morning, usually does not check blood glucose in the morning.  She currently takes metformin 1000 mg twice daily and Lantus 30 units nightly.  She has had episode of hypoglycemia in the past as well, led to a fall in 07/24.  Past Medical History:  Diagnosis Date   BACK PAIN, THORACIC REGION, RIGHT 01/27/2010   Qualifier: Diagnosis of  By: Lodema Hong MD, Gwendalyn Ege of foot 07/15/2017   Dysrhythmia    Essential hypertension    Headache(784.0)    Hematuria 02/24/2016   History of kidney stones    Hyperlipidemia    Irregular heart beats    Microscopic hematuria 02/24/2016   Neck pain on right side 01/23/2013   Piles (hemorrhoids) 06/22/2011   Renal calculus, right 05/31/2016   Shoulder pain, right 01/25/2014   SKIN TAG 06/13/2008   Qualifier: Diagnosis of  By: Lodema Hong MD, Margaret     TIA (transient ischemic attack) 06/19/2016   Type 2 diabetes mellitus (HCC)    Vaginitis     Past  Surgical History:  Procedure Laterality Date   COLONOSCOPY N/A 10/26/2018   Procedure: COLONOSCOPY;  Surgeon: Malissa Hippo, MD;  Location: [facility];  Service: Endoscopy;  Laterality: N/A;  830-10:30am   COLONOSCOPY WITH PROPOFOL N/A 09/17/2022   Procedure: COLONOSCOPY WITH PROPOFOL;  Surgeon: Dolores Frame, MD;  Location: [facility];  Service: Gastroenterology;  Laterality: N/A;  8:30AM;ASA 1-2   cyst removed Right 1998   cyst- removed from right wrist   CYSTOSCOPY WITH RETROGRADE PYELOGRAM, URETEROSCOPY AND STENT PLACEMENT Right 05/19/2020   Procedure: CYSTOSCOPY WITH RETROGRADE PYELOGRAM, URETEROSCOPY AND STENT PLACEMENT;  Surgeon: Malen Gauze, MD;  Location: [facility];  Service: Urology;  Laterality: Right;   CYSTOSCOPY WITH RETROGRADE PYELOGRAM, URETEROSCOPY AND STENT PLACEMENT Right 06/26/2020   Procedure: CYSTOSCOPY WITH RETROGRADE PYELOGRAM, URETEROSCOPY AND STENT EXCHANGE;  Surgeon: Malen Gauze, MD;  Location: [facility];  Service: Urology;  Laterality: Right;   EXTRACORPOREAL SHOCK WAVE LITHOTRIPSY Right 05/29/2018   Procedure: EXTRACORPOREAL SHOCK WAVE LITHOTRIPSY (ESWL);  Surgeon: Ihor Gully, MD;  Location: [facility];  Service: Urology;  Laterality: Right;   EXTRACORPOREAL SHOCK WAVE LITHOTRIPSY Right 11/23/2022   Procedure: EXTRACORPOREAL SHOCK WAVE LITHOTRIPSY (ESWL);  Surgeon: Malen Gauze, MD;  Location: [facility];  Service: Urology;  Laterality: Right;   HOLMIUM LASER  APPLICATION Right 06/26/2020   Procedure: HOLMIUM LASER APPLICATION;  Surgeon: Malen Gauze, MD;  Location: [facility];  Service: Urology;  Laterality: Right;   lithotrpsy     over 20 years   NEPHROLITHOTOMY Right 05/31/2016   Procedure: NEPHROLITHOTOMY PERCUTANEOUS WITH SURGEON ACCESS;  Surgeon: Malen Gauze, MD;  Location: [facility];  Service: Urology;  Laterality: Right;   POLYPECTOMY  10/26/2018   Procedure: POLYPECTOMY;  Surgeon: Malissa Hippo, MD;  [facility];  Service: Endoscopy;;  colon   POLYPECTOMY  09/17/2022   Procedure: POLYPECTOMY;  Surgeon: Dolores Frame, MD;  Location: [facility];  Service: Gastroenterology;;   TOTAL ABDOMINAL HYSTERECTOMY W/ BILATERAL SALPINGOOPHORECTOMY  1998   TUBAL LIGATION      Family History  Problem Relation Age of Onset   Alcohol abuse Mother    Esophageal cancer Father    Alcohol abuse Father    Diabetes Sister    Diabetes Sister    Hypertension Sister    Heart attack Sister        in 57's   Seizures Brother     Social History   Socioeconomic History   Marital status: Single    Spouse name: Not on file   Number of children: 1   Years of education: Not on file   Highest education level: 12th grade  Occupational History   Occupation: employed   Tobacco Use   Smoking status: Never   Smokeless tobacco: Never  Vaping Use   Vaping status: Never Used  Substance and Sexual Activity   Alcohol use: No   Drug use: No   Sexual activity: Not Currently  Other Topics Concern   Not on file  Social History Narrative   Not on file   Social Drivers of Health   Financial Resource Strain: Low Risk  (02/16/2022)   Overall Financial Resource Strain (CARDIA)    Difficulty of Paying Living Expenses: Not hard at all  Food Insecurity: No Food Insecurity (02/16/2022)   Hunger Vital Sign    Worried About Running Out of Food in the Last Year: Never true    Ran Out of Food in the Last Year: Never true  Transportation Needs: No Transportation Needs (02/16/2022)   PRAPARE - Administrator, Civil Service (Medical): No    Lack of Transportation (Non-Medical): No  Physical Activity: Sufficiently Active (02/16/2022)   Exercise Vital Sign    Days of Exercise per Week: 3 days    Minutes of Exercise per Session: 50 min  Stress: No Stress Concern Present (02/16/2022)   [employer] of Occupational Health - Occupational Stress Questionnaire    Feeling of Stress : Only a little   Social Connections: Moderately Isolated (02/16/2022)   Social Connection and Isolation Panel [NHANES]    Frequency of Communication with Friends and Family: Twice a week    Frequency of Social Gatherings with Friends and Family: Three times a week    Attends Religious Services: 1 to 4 times per year    Active Member of Clubs or Organizations: No    Attends Banker Meetings: Never    Marital Status: Separated  Intimate Partner Violence: Not At Risk (02/16/2022)   Humiliation, Afraid, Rape, and Kick questionnaire    Fear of Current or Ex-Partner: No    Emotionally Abused: No    Physically Abused: No    Sexually Abused: No    Outpatient Medications Prior to Visit  Medication Sig Dispense  Refill   ACCU-CHEK GUIDE TEST test strip 1 EACH BY IN VITRO ROUTE IN THE MORNING, AT NOON, AND AT BEDTIME. MAY SUBSTITUTE TO ANY MANUFACTURER COVERED BY PATIENT'S INSURANCE. 100 strip 0   acetaminophen (TYLENOL) 500 MG tablet Take 1,000 mg by mouth every 6 (six) hours as needed for headache or moderate pain.     amLODipine (NORVASC) 5 MG tablet TAKE 1 TABLET (5 MG TOTAL) BY MOUTH DAILY. 90 tablet 0   aspirin EC 81 MG tablet Take 81 mg by mouth daily.     atorvastatin (LIPITOR) 10 MG tablet TAKE ONE TABLET BY MOUTH EVERY MON, WED, AND FRI, AND HALF A TABLET TUES, THURS, SAT, AND SUN 60 tablet 3   B-D ULTRAFINE III SHORT PEN 31G X 8 MM MISC FOR USE WITH INSULIN ONCE DAILY DX E11.9 100 each 5   Blood Glucose Monitoring Suppl (ACCU-CHEK GUIDE ME) w/Device KIT 1 EACH BY DOES NOT APPLY ROUTE IN THE MORNING, AT NOON, AND AT BEDTIME. MAY SUBSTITUTE TO ANY MANUFACTURER COVERED BY PATIENT'S INSURANCE. 1 kit 0   Cholecalciferol (VITAMIN D3) 50 MCG (2000 UT) TABS Take 2,000 Units by mouth daily.     Continuous Glucose Receiver (DEXCOM G7 RECEIVER) DEVI Use to check blood sugar daily. DX : E11.69 1 each 0   Continuous Glucose Sensor (DEXCOM G7 SENSOR) MISC Use to check blood sugar daily. DX: E11.69 1 each 3    estradiol (ESTRACE) 0.1 MG/GM vaginal cream Discard plastic applicator. Insert a blueberry size amount (approximately 1 gram) of cream on fingertip inside vagina at bedtime every night for 1 week then every other night. For long term use. 30 g 3   ferrous sulfate 325 (65 FE) MG EC tablet TAKE 1 TABLET BY MOUTH TWICE A DAY WITH MEALS 180 tablet 1   glucose 4 GM chewable tablet Chew 1 tablet by mouth as needed for low blood sugar.     insulin glargine (LANTUS SOLOSTAR) 100 UNIT/ML Solostar Pen Inject 30 Units into the skin at bedtime. 15 mL 5   meclizine (ANTIVERT) 12.5 MG tablet TAKE 1 TABLET BY MOUTH 3 TIMES DAILY AS NEEDED FOR DIZZINESS. 30 tablet 0   metFORMIN (GLUCOPHAGE) 1000 MG tablet TAKE 1 TABLET BY MOUTH TWICE A DAY WITH FOOD 180 tablet 1   nitrofurantoin (MACRODANTIN) 50 MG capsule Take 1 capsule daily for UTI prevention. Start this 1 day after completing acute course of antibiotic (Macrobid (Nitrofurantoin) 100 mg 2x/day x7 days). 30 capsule 2   olmesartan (BENICAR) 20 MG tablet TAKE 1 TABLET BY MOUTH EVERY DAY 90 tablet 0   OneTouch Delica Lancets 33G MISC Once daily dx e11.9 (give lancects that go with pts meter) 100 each 5   ONETOUCH ULTRA test strip USE AS INSTRUCTED 100 strip 5   No facility-administered medications prior to visit.    Allergies  Allergen Reactions   Onglyza [Saxagliptin Hydrochloride] Swelling    Tongue swell   Pravastatin Other (See Comments)    Muscle aches   Ace Inhibitors Cough    ROS Review of Systems  Constitutional:  Positive for fatigue. Negative for chills and fever.  HENT:  Negative for congestion, sinus pressure, sinus pain and sore throat.   Eyes:  Negative for pain and discharge.  Respiratory:  Negative for cough and shortness of breath.   Cardiovascular:  Negative for chest pain and palpitations.  Gastrointestinal:  Negative for abdominal pain, diarrhea, nausea and vomiting.  Endocrine: Negative for polydipsia and polyuria.   Genitourinary:  Negative for dysuria and hematuria.  Musculoskeletal:  Negative for neck pain and neck stiffness.  Skin:  Negative for rash.  Neurological:  Positive for weakness. Negative for dizziness.  Psychiatric/Behavioral:  Negative for agitation and behavioral problems.       Objective:    Physical Exam Vitals reviewed.  Constitutional:      General: She is not in acute distress.    Appearance: She is not diaphoretic.  HENT:     Head: Normocephalic and atraumatic.     Nose: Nose normal. No congestion.     Mouth/Throat:     Mouth: Mucous membranes are moist.     Pharynx: No posterior oropharyngeal erythema.  Eyes:     General: No scleral icterus.    Extraocular Movements: Extraocular movements intact.  Cardiovascular:     Rate and Rhythm: Normal rate and regular rhythm.     Heart sounds: Normal heart sounds. No murmur heard. Pulmonary:     Breath sounds: Normal breath sounds. No wheezing or rales.  Musculoskeletal:     Cervical back: Neck supple. No tenderness.     Right lower leg: No edema.     Left lower leg: No edema.  Skin:    General: Skin is warm.     Findings: No rash.  Neurological:     General: No focal deficit present.     Mental Status: She is alert and oriented to person, place, and time.  Psychiatric:        Mood and Affect: Mood normal.        Behavior: Behavior normal.     BP 130/80   Pulse (!) 120   Ht 5\' 3"  (1.6 m)   Wt 133 lb 6.4 oz (60.5 kg)   SpO2 95%   BMI 23.63 kg/m  Wt Readings from Last 3 Encounters:  04/20/23 133 lb 6.4 oz (60.5 kg)  12/09/22 136 lb (61.7 kg)  11/23/22 136 lb 0.4 oz (61.7 kg)    Lab Results  Component Value Date   TSH 0.917 10/01/2022   Lab Results  Component Value Date   WBC 5.6 09/25/2022   HGB 12.8 09/25/2022   HCT 41.1 09/25/2022   MCV 93.4 09/25/2022   PLT 357 09/25/2022   Lab Results  Component Value Date   NA 143 10/01/2022   K 4.0 10/01/2022   CO2 25 10/01/2022   GLUCOSE 75 10/01/2022    BUN 16 10/01/2022   CREATININE 0.81 10/01/2022   BILITOT 0.4 10/01/2022   ALKPHOS 117 10/01/2022   AST 15 10/01/2022   ALT 11 10/01/2022   PROT 7.6 10/01/2022   ALBUMIN 4.7 10/01/2022   CALCIUM 10.2 10/01/2022   ANIONGAP 11 09/25/2022   EGFR 77 10/01/2022   Lab Results  Component Value Date   CHOL 201 (H) 10/01/2022   Lab Results  Component Value Date   HDL 96 10/01/2022   Lab Results  Component Value Date   LDLCALC 91 10/01/2022   Lab Results  Component Value Date   TRIG 77 10/01/2022   Lab Results  Component Value Date   CHOLHDL 2.1 10/01/2022   Lab Results  Component Value Date   HGBA1C 7.1 (A) 09/29/2022      Assessment & Plan:   Problem List Items Addressed This Visit       Cardiovascular and Mediastinum   Essential hypertension   BP Readings from Last 1 Encounters:  04/20/23 130/80   Well-controlled with amlodipine 5 mg QD and olmesartan 20 mg  QD Counseled for compliance with the medications Advised low carb diet and ambulate as tolerated         Endocrine   Type 2 diabetes mellitus with other specified complication (HCC) - Primary   Lab Results  Component Value Date   HGBA1C 7.1 (A) 09/29/2022   Uncontrolled with fluctuant glycemic profile On metformin 1000 mg twice daily and Lantus 30 units nightly Considering her eating patterns and hypoglycemia episodes, decreased Lantus dose to 12 units twice daily - advised to avoid taking insulin if she skips meal  Discussed about warning signs of hypoglycemia Prescribed Gvoke hypopen for hypoglycemia She has CGM - needs nurse visit to start using it Advised to follow diabetic diet On ARB and statin F/u CMP and HbA1c Diabetic eye exam: Advised to follow up with Ophthalmology for diabetic eye exam      Relevant Medications   Glucagon (GVOKE HYPOPEN 2-PACK) 1 MG/0.2ML SOAJ   Other Relevant Orders   CMP14+EGFR   Hemoglobin A1c   Hypoglycemia   Due to dietary inconsistencies Needs to follow  small, frequent meals Needs to avoid prolonged starving periods- advised to have breakfast within an hour of waking up to avoid AM hypoglycemia Gvoke hypopen for hypoglycemia      Relevant Medications   Glucagon (GVOKE HYPOPEN 2-PACK) 1 MG/0.2ML SOAJ   Other Relevant Orders   CMP14+EGFR    Meds ordered this encounter  Medications   Glucagon (GVOKE HYPOPEN 2-PACK) 1 MG/0.2ML SOAJ    Sig: Inject 1 mg into the skin as needed (For blood glucose less than 53).    Dispense:  0.4 mL    Refill:  2    Okay to dispense generic Glucagon if Gvoke not covered by insurance.    Follow-up: Return in about 3 weeks (around 05/11/2023) for DM.    Anabel Halon, MD

## 2023-04-20 NOTE — Assessment & Plan Note (Signed)
Due to dietary inconsistencies Needs to follow small, frequent meals Needs to avoid prolonged starving periods- advised to have breakfast within an hour of waking up to avoid AM hypoglycemia Gvoke hypopen for hypoglycemia

## 2023-04-20 NOTE — Assessment & Plan Note (Signed)
Lab Results  Component Value Date   HGBA1C 7.1 (A) 09/29/2022   Uncontrolled with fluctuant glycemic profile On metformin 1000 mg twice daily and Lantus 30 units nightly Considering her eating patterns and hypoglycemia episodes, decreased Lantus dose to 12 units twice daily - advised to avoid taking insulin if she skips meal  Discussed about warning signs of hypoglycemia Prescribed Gvoke hypopen for hypoglycemia She has CGM - needs nurse visit to start using it Advised to follow diabetic diet On ARB and statin F/u CMP and HbA1c Diabetic eye exam: Advised to follow up with Ophthalmology for diabetic eye exam

## 2023-04-21 LAB — HEMOGLOBIN A1C
Est. average glucose Bld gHb Est-mCnc: 166 mg/dL
Hgb A1c MFr Bld: 7.4 % — ABNORMAL HIGH (ref 4.8–5.6)

## 2023-04-21 LAB — CMP14+EGFR
ALT: 9 [IU]/L (ref 0–32)
AST: 14 [IU]/L (ref 0–40)
Albumin: 4.6 g/dL (ref 3.8–4.8)
Alkaline Phosphatase: 128 [IU]/L — ABNORMAL HIGH (ref 44–121)
BUN/Creatinine Ratio: 16 (ref 12–28)
BUN: 17 mg/dL (ref 8–27)
Bilirubin Total: 0.4 mg/dL (ref 0.0–1.2)
CO2: 26 mmol/L (ref 20–29)
Calcium: 10.5 mg/dL — ABNORMAL HIGH (ref 8.7–10.3)
Chloride: 101 mmol/L (ref 96–106)
Creatinine, Ser: 1.09 mg/dL — ABNORMAL HIGH (ref 0.57–1.00)
Globulin, Total: 2.4 g/dL (ref 1.5–4.5)
Glucose: 207 mg/dL — ABNORMAL HIGH (ref 70–99)
Potassium: 4.5 mmol/L (ref 3.5–5.2)
Sodium: 143 mmol/L (ref 134–144)
Total Protein: 7 g/dL (ref 6.0–8.5)
eGFR: 54 mL/min/{1.73_m2} — ABNORMAL LOW (ref >59)

## 2023-05-11 DIAGNOSIS — N390 Urinary tract infection, site not specified: Secondary | ICD-10-CM | POA: Insufficient documentation

## 2023-05-11 NOTE — Progress Notes (Deleted)
 Name: [name] DOB: 1951/01/05 MRN: [id number]  History of Present Illness: [name] is a 73 y.o. female who presents today for follow up visit at [facility] [place]. ***She is accompanied by ***. - GU history: 1. Recurrent UTI. 2. Kidney stones. - 11/23/2022 : Underwent right ESWL procedure by Dr. Ronne Binning. - 12/09/2022: Stone analysis showed 100% calcium oxalate composition.   Urine culture results in past 12 months: - 05/03/2022: Positive for Enterococcus faecalis - 09/25/2022: Negative - 10/04/2022: Positive for Enterococcus faecalis - 11/01/2022: Positive for Enterococcus faecalis - 11/18/2022: Positive for Enterococcus faecalis - 02/10/2023: Negative  At last visit on 02/10/2023: > Reported intermittent urinary urgency, frequency, nocturia, and dysuria.  > The plan was: 1. For suspected acute UTI: - Urine culture (negative). - Empiric treatment for suspected UTI with Macrobid. 2. For UTI prevention: - Nitrofurantoin 50 mg daily. - Topical vaginal estrogen cream as prescribed. - Maintain adequate fluid intake daily to flush out the urinary tract. - Urinate every 4-6 hours while awake to minimize urinary stasis / bacterial overgrowth in the bladder. - Consider OTC supplements for UTI prevention. 3. Return in 3 months (on 05/11/2023) for UA, PVR, & f/u with Evette Georges NP for stone & rUTI; needs KUB prior to visit.  Since last visit: ***  Today: KUB today: Awaiting radiology read; *** appreciated per provider interpretation.  She {Actions; denies-reports:120008} increased urinary urgency, frequency, dysuria, gross hematuria, straining to void, or sensations of incomplete emptying.  She {Actions; denies-reports:120008} flank pain or abdominal pain.  She {HAS HAS ZHY:86578} been using vaginal estrogen cream at a frequency of *** time(s) per week. She {Actions; denies-reports:120008} vaginal pain, bleeding, abnormal discharge, itching,  dryness.   Medications: Current Outpatient Medications  Medication Sig Dispense Refill   ACCU-CHEK GUIDE TEST test strip 1 EACH BY IN VITRO ROUTE IN THE MORNING, AT NOON, AND AT BEDTIME. MAY SUBSTITUTE TO ANY MANUFACTURER COVERED BY PATIENT'S INSURANCE. 100 strip 0   acetaminophen (TYLENOL) 500 MG tablet Take 1,000 mg by mouth every 6 (six) hours as needed for headache or moderate pain.     amLODipine (NORVASC) 5 MG tablet TAKE 1 TABLET (5 MG TOTAL) BY MOUTH DAILY. 90 tablet 0   aspirin EC 81 MG tablet Take 81 mg by mouth daily.     atorvastatin (LIPITOR) 10 MG tablet TAKE ONE TABLET BY MOUTH EVERY MON, WED, AND FRI, AND HALF A TABLET TUES, THURS, SAT, AND SUN 60 tablet 3   B-D ULTRAFINE III SHORT PEN 31G X 8 MM MISC FOR USE WITH INSULIN ONCE DAILY DX E11.9 100 each 5   Blood Glucose Monitoring Suppl (ACCU-CHEK GUIDE ME) w/Device KIT 1 EACH BY DOES NOT APPLY ROUTE IN THE MORNING, AT NOON, AND AT BEDTIME. MAY SUBSTITUTE TO ANY MANUFACTURER COVERED BY PATIENT'S INSURANCE. 1 kit 0   Cholecalciferol (VITAMIN D3) 50 MCG (2000 UT) TABS Take 2,000 Units by mouth daily.     Continuous Glucose Receiver (DEXCOM G7 RECEIVER) DEVI Use to check blood sugar daily. DX : E11.69 1 each 0   Continuous Glucose Sensor (DEXCOM G7 SENSOR) MISC Use to check blood sugar daily. DX: E11.69 1 each 3   estradiol (ESTRACE) 0.1 MG/GM vaginal cream Discard plastic applicator. Insert a blueberry size amount (approximately 1 gram) of cream on fingertip inside vagina at bedtime every night for 1 week then every other night. For long term use. 30 g 3   ferrous sulfate 325 (65 FE) MG EC tablet TAKE 1 TABLET  BY MOUTH TWICE A DAY WITH MEALS 180 tablet 1   Glucagon (GVOKE HYPOPEN 2-PACK) 1 MG/0.2ML SOAJ Inject 1 mg into the skin as needed (For blood glucose less than 53). 0.4 mL 2   glucose 4 GM chewable tablet Chew 1 tablet by mouth as needed for low blood sugar.     insulin glargine (LANTUS SOLOSTAR) 100 UNIT/ML Solostar Pen Inject  30 Units into the skin at bedtime. 15 mL 5   meclizine (ANTIVERT) 12.5 MG tablet TAKE 1 TABLET BY MOUTH 3 TIMES DAILY AS NEEDED FOR DIZZINESS. 30 tablet 0   metFORMIN (GLUCOPHAGE) 1000 MG tablet TAKE 1 TABLET BY MOUTH TWICE A DAY WITH FOOD 180 tablet 1   nitrofurantoin (MACRODANTIN) 50 MG capsule Take 1 capsule daily for UTI prevention. Start this 1 day after completing acute course of antibiotic (Macrobid (Nitrofurantoin) 100 mg 2x/day x7 days). 30 capsule 2   olmesartan (BENICAR) 20 MG tablet TAKE 1 TABLET BY MOUTH EVERY DAY 90 tablet 0   OneTouch Delica Lancets 33G MISC Once daily dx e11.9 (give lancects that go with pts meter) 100 each 5   ONETOUCH ULTRA test strip USE AS INSTRUCTED 100 strip 5   No current facility-administered medications for this visit.    Allergies: Allergies  Allergen Reactions   Onglyza [Saxagliptin Hydrochloride] Swelling    Tongue swell   Pravastatin Other (See Comments)    Muscle aches   Ace Inhibitors Cough    Past Medical History:  Diagnosis Date   BACK PAIN, THORACIC REGION, RIGHT 01/27/2010   Qualifier: Diagnosis of  By: Lodema Hong MD, Gwendalyn Ege of foot 07/15/2017   Dysrhythmia    Essential hypertension    Headache(784.0)    Hematuria 02/24/2016   History of kidney stones    Hyperlipidemia    Irregular heart beats    Microscopic hematuria 02/24/2016   Neck pain on right side 01/23/2013   Piles (hemorrhoids) 06/22/2011   Renal calculus, right 05/31/2016   Shoulder pain, right 01/25/2014   SKIN TAG 06/13/2008   Qualifier: Diagnosis of  By: Lodema Hong MD, Margaret     TIA (transient ischemic attack) 06/19/2016   Type 2 diabetes mellitus (HCC)    Vaginitis    Past Surgical History:  Procedure Laterality Date   COLONOSCOPY N/A 10/26/2018   Procedure: COLONOSCOPY;  Surgeon: Malissa Hippo, MD;  [facility];  Service: Endoscopy;  Laterality: N/A;  830-10:30am   COLONOSCOPY WITH PROPOFOL N/A 09/17/2022   Procedure: COLONOSCOPY  WITH PROPOFOL;  Surgeon: Dolores Frame, MD;  Location: [facility];  Service: Gastroenterology;  Laterality: N/A;  8:30AM;ASA 1-2   cyst removed Right 1998   cyst- removed from right wrist   CYSTOSCOPY WITH RETROGRADE PYELOGRAM, URETEROSCOPY AND STENT PLACEMENT Right 05/19/2020   Procedure: CYSTOSCOPY WITH RETROGRADE PYELOGRAM, URETEROSCOPY AND STENT PLACEMENT;  Surgeon: Malen Gauze, MD;  Location: [facility];  Service: Urology;  Laterality: Right;   CYSTOSCOPY WITH RETROGRADE PYELOGRAM, URETEROSCOPY AND STENT PLACEMENT Right 06/26/2020   Procedure: CYSTOSCOPY WITH RETROGRADE PYELOGRAM, URETEROSCOPY AND STENT EXCHANGE;  Surgeon: Malen Gauze, MD;  Location: [facility];  Service: Urology;  Laterality: Right;   EXTRACORPOREAL SHOCK WAVE LITHOTRIPSY Right 05/29/2018   Procedure: EXTRACORPOREAL SHOCK WAVE LITHOTRIPSY (ESWL);  Surgeon: Ihor Gully, MD;  Location: [facility];  Service: Urology;  Laterality: Right;   EXTRACORPOREAL SHOCK WAVE LITHOTRIPSY Right 11/23/2022   Procedure: EXTRACORPOREAL SHOCK WAVE LITHOTRIPSY (ESWL);  Surgeon: Malen Gauze, MD;  Location:  [facility];  Service: Urology;  Laterality: Right;   HOLMIUM LASER APPLICATION Right 06/26/2020   Procedure: HOLMIUM LASER APPLICATION;  Surgeon: Malen Gauze, MD;  Location: [facility];  Service: Urology;  Laterality: Right;   lithotrpsy     over 20 years   NEPHROLITHOTOMY Right 05/31/2016   Procedure: NEPHROLITHOTOMY PERCUTANEOUS WITH SURGEON ACCESS;  Surgeon: Malen Gauze, MD;  Location: [facility];  Service: Urology;  Laterality: Right;   POLYPECTOMY  10/26/2018   Procedure: POLYPECTOMY;  Surgeon: Malissa Hippo, MD;  [facility];  Service: Endoscopy;;  colon   POLYPECTOMY  09/17/2022   Procedure: POLYPECTOMY;  Surgeon: Dolores Frame, MD;  [facility];  Service: Gastroenterology;;   TOTAL ABDOMINAL HYSTERECTOMY W/ BILATERAL SALPINGOOPHORECTOMY  1998   TUBAL LIGATION     Family  History  Problem Relation Age of Onset   Alcohol abuse Mother    Esophageal cancer Father    Alcohol abuse Father    Diabetes Sister    Diabetes Sister    Hypertension Sister    Heart attack Sister        in 42's   Seizures Brother    Social History   Socioeconomic History   Marital status: Single    Spouse name: Not on file   Number of children: 1   Years of education: Not on file   Highest education level: 12th grade  Occupational History   Occupation: employed   Tobacco Use   Smoking status: Never   Smokeless tobacco: Never  Vaping Use   Vaping status: Never Used  Substance and Sexual Activity   Alcohol use: No   Drug use: No   Sexual activity: Not Currently  Other Topics Concern   Not on file  Social History Narrative   Not on file   Social Drivers of Health   Financial Resource Strain: Low Risk  (02/16/2022)   Overall Financial Resource Strain (CARDIA)    Difficulty of Paying Living Expenses: Not hard at all  Food Insecurity: No Food Insecurity (02/16/2022)   Hunger Vital Sign    Worried About Running Out of Food in the Last Year: Never true    Ran Out of Food in the Last Year: Never true  Transportation Needs: No Transportation Needs (02/16/2022)   PRAPARE - Administrator, Civil Service (Medical): No    Lack of Transportation (Non-Medical): No  Physical Activity: Sufficiently Active (02/16/2022)   Exercise Vital Sign    Days of Exercise per Week: 3 days    Minutes of Exercise per Session: 50 min  Stress: No Stress Concern Present (02/16/2022)   [employer] of Occupational Health - Occupational Stress Questionnaire    Feeling of Stress : Only a little  Social Connections: Moderately Isolated (02/16/2022)   Social Connection and Isolation Panel [NHANES]    Frequency of Communication with Friends and Family: Twice a week    Frequency of Social Gatherings with Friends and Family: Three times a week    Attends Religious Services: 1 to 4  times per year    Active Member of Clubs or Organizations: No    Attends Banker Meetings: Never    Marital Status: Separated  Intimate Partner Violence: Not At Risk (02/16/2022)   Humiliation, Afraid, Rape, and Kick questionnaire    Fear of Current or Ex-Partner: No    Emotionally Abused: No    Physically Abused: No    Sexually Abused: No  Review of Systems Constitutional: Patient denies any unintentional weight loss or change in strength lntegumentary: Patient denies any rashes or pruritus Cardiovascular: Patient denies chest pain or syncope Respiratory: Patient denies shortness of breath Gastrointestinal: ***Patient {Actions; denies-reports:120008} ***nausea, ***vomiting, ***constipation, ***diarrhea ***As per HPI Musculoskeletal: Patient denies muscle cramps or weakness Neurologic: Patient denies convulsions or seizures Allergic/Immunologic: Patient denies recent allergic reaction(s) Hematologic/Lymphatic: Patient denies bleeding tendencies Endocrine: Patient denies heat/cold intolerance  GU: As per HPI.  OBJECTIVE There were no vitals filed for this visit. There is no height or weight on file to calculate BMI.  Physical Examination Constitutional: No obvious distress; patient is non-toxic appearing  Cardiovascular: No visible lower extremity edema.  Respiratory: The patient does not have audible wheezing/stridor; respirations do not appear labored  Gastrointestinal: Abdomen non-distended Musculoskeletal: Normal ROM of UEs  Skin: No obvious rashes/open sores  Neurologic: CN 2-12 grossly intact Psychiatric: Answered questions appropriately with normal affect  Hematologic/Lymphatic/Immunologic: No obvious bruises or sites of spontaneous bleeding  UA:  ***positive for *** leukocytes, *** blood, ***nitrites ***Urine microscopy:  ***negative  *** WBC/hpf, *** RBC/hpf, *** bacteria ***with no evidence of UTI ***with no evidence of microscopic  hematuria ***otherwise unremarkable ***glucosuria (secondary to ***Jardiance ***Farxiga use)  PVR: *** ml  ASSESSMENT No diagnosis found. ***  We agreed to plan for follow up in *** months or sooner if needed. Patient verbalized understanding of and agreement with current plan. All questions were answered.  PLAN Advised the following: 1. *** 2. ***No follow-ups on file.  No orders of the defined types were placed in this encounter.   It has been explained that the patient is to follow regularly with their PCP in addition to all other providers involved in their care and to follow instructions provided by these respective offices. Patient advised to contact urology clinic if any urologic-pertaining questions, concerns, new symptoms or problems arise in the interim period.  There are no Patient Instructions on file for this visit.  Electronically signed by:  Donnita Falls, FNP   05/11/23    2:49 PM

## 2023-05-12 ENCOUNTER — Encounter: Payer: Self-pay | Admitting: Family Medicine

## 2023-05-12 ENCOUNTER — Ambulatory Visit: Payer: Medicare HMO | Admitting: Urology

## 2023-05-12 ENCOUNTER — Other Ambulatory Visit: Payer: Self-pay | Admitting: Family Medicine

## 2023-05-12 ENCOUNTER — Ambulatory Visit (INDEPENDENT_AMBULATORY_CARE_PROVIDER_SITE_OTHER): Payer: Medicare HMO | Admitting: Family Medicine

## 2023-05-12 VITALS — BP 132/71 | HR 107 | Resp 16 | Ht 64.0 in | Wt 131.4 lb

## 2023-05-12 DIAGNOSIS — R0989 Other specified symptoms and signs involving the circulatory and respiratory systems: Secondary | ICD-10-CM

## 2023-05-12 DIAGNOSIS — E559 Vitamin D deficiency, unspecified: Secondary | ICD-10-CM | POA: Diagnosis not present

## 2023-05-12 DIAGNOSIS — E1169 Type 2 diabetes mellitus with other specified complication: Secondary | ICD-10-CM

## 2023-05-12 DIAGNOSIS — N200 Calculus of kidney: Secondary | ICD-10-CM

## 2023-05-12 DIAGNOSIS — R42 Dizziness and giddiness: Secondary | ICD-10-CM | POA: Diagnosis not present

## 2023-05-12 DIAGNOSIS — R350 Frequency of micturition: Secondary | ICD-10-CM

## 2023-05-12 DIAGNOSIS — Z794 Long term (current) use of insulin: Secondary | ICD-10-CM

## 2023-05-12 DIAGNOSIS — Z780 Asymptomatic menopausal state: Secondary | ICD-10-CM | POA: Diagnosis not present

## 2023-05-12 DIAGNOSIS — Z789 Other specified health status: Secondary | ICD-10-CM | POA: Diagnosis not present

## 2023-05-12 DIAGNOSIS — E782 Mixed hyperlipidemia: Secondary | ICD-10-CM

## 2023-05-12 DIAGNOSIS — N390 Urinary tract infection, site not specified: Secondary | ICD-10-CM

## 2023-05-12 DIAGNOSIS — I10 Essential (primary) hypertension: Secondary | ICD-10-CM | POA: Diagnosis not present

## 2023-05-12 NOTE — Assessment & Plan Note (Addendum)
 Diabetes associated with hypertension and hyperlipidemia Uncontrolled , maarked fluctuation in blood sugar with episodess of hypoglycemia, needs management by Endo and she is ready for this has requested in the past also needs to continue siabetic ed,missed recent appointment  Ms. Aggarwal is reminded of the importance of commitment to daily physical activity for 30 minutes or more, as able and the need to limit carbohydrate intake to 30 to 60 grams per meal to help with blood sugar control.   The need to take medication as prescribed, test blood sugar as directed, and to call between visits if there is a concern that blood sugar is uncontrolled is also discussed.   Ms. Nakajima is reminded of the importance of daily foot exam, annual eye examination, and good blood sugar, blood pressure and cholesterol control.     Latest Ref Rng & Units 04/20/2023    3:02 PM 10/01/2022    4:07 PM 09/29/2022   11:19 AM 09/25/2022    6:07 PM 05/04/2022    3:48 PM  Diabetic Labs  HbA1c 4.8 - 5.6 % 7.4   7.1   8.2   Chol 100 - 199 mg/dL  161      HDL >09 mg/dL  96      Calc LDL 0 - 99 mg/dL  91      Triglycerides 0 - 149 mg/dL  77      Creatinine 6.04 - 1.00 mg/dL 5.40  9.81   1.91  4.78       05/12/2023    2:25 PM 04/20/2023    2:06 PM 02/10/2023   11:36 AM 12/09/2022    2:22 PM 12/09/2022    1:19 PM 11/23/2022    8:53 AM 11/23/2022    7:24 AM  BP/Weight  Systolic BP 132 130 122 167  141 140  Diastolic BP 71 80 75 95  85 84  Wt. (Lbs) 131.4 133.4   136    BMI 22.55 kg/m2 23.63 kg/m2   24.09 kg/m2        Latest Ref Rng & Units 10/27/2022   12:00 AM 08/21/2021    8:40 AM  Foot/eye exam completion dates  Eye Exam No Retinopathy No Retinopathy       Foot Form Completion   Done     This result is from an external source.

## 2023-05-12 NOTE — Patient Instructions (Addendum)
   Annual exam wirh MD in 12 to 14 weeks, call if you need me sooner  Please schedule AWV at checkout  You are referred to dr Fransico Him for diabetes and you need to keep appointment with diabetic educator  You are being referred to [facility] nurse coordinator  You are referred for bone density test  I will refer you for a test to check circulation to brain, caortid doppler  Meclizinr will be refilled  Need fasting lipid, cmp ad EGFR 1 week before next appt also cBC and Vit D level and tSH( nurse ps order)  Vaccines needed and are at your pharmacy, Covid, rSV, TSaP and shingrix

## 2023-05-13 ENCOUNTER — Encounter: Payer: Self-pay | Admitting: Family Medicine

## 2023-05-13 ENCOUNTER — Telehealth: Payer: Self-pay | Admitting: *Deleted

## 2023-05-13 DIAGNOSIS — R0989 Other specified symptoms and signs involving the circulatory and respiratory systems: Secondary | ICD-10-CM | POA: Insufficient documentation

## 2023-05-13 DIAGNOSIS — Z789 Other specified health status: Secondary | ICD-10-CM | POA: Insufficient documentation

## 2023-05-13 DIAGNOSIS — E559 Vitamin D deficiency, unspecified: Secondary | ICD-10-CM | POA: Insufficient documentation

## 2023-05-13 DIAGNOSIS — Z780 Asymptomatic menopausal state: Secondary | ICD-10-CM | POA: Insufficient documentation

## 2023-05-13 NOTE — Progress Notes (Signed)
 [name]     MRN: [id number]      DOB: November 15, 1950  Chief Complaint  Patient presents with   Diabetes    States she's not doing too well with her diabetes. She states she is not taking her meds right and not eating properly     HPI [name] is here for follow up and re-evaluation of chronic medical conditions, medication management and review of any available recent lab and radiology data.  Preventive health is updated, specifically  Cancer screening and Immunization.   Concerned about her diabetes , and reports personal non compliance with both diet and med adherence,  C/o recurrent light headedness esp in the morning when she first wakes up this has been present for months and she uses antivert as needed   ROS Denies recent fever or needs Endo management as blood sugar remains uncontrolled , yet recently had episode of hypoglycemia when she was found by her sister with a blood sugar of 30, not the first occurrence. Denies sinus pressure, nasal congestion, ear pain or sore throat. Denies chest congestion, productive cough or wheezing. Denies chest pains, palpitations and leg swelling Denies abdominal pain, nausea, vomiting,diarrhea or constipation.   Denies dysuria, frequency, hesitancy or incontinence.  Denies headaches, seizures, numbness, or tingling. Denies depression, anxiety or insomnia. Denies skin break down or rash.   PE  BP 132/71   Pulse (!) 107   Resp 16   Ht 5\' 4"  (1.626 m)   Wt 131 lb 6.4 oz (59.6 kg)   SpO2 93%   BMI 22.55 kg/m   Patient alert and oriented and in no cardiopulmonary distress.  HEENT: No facial asymmetry, EOMI,     Neck supple .carotid bruits  Chest: Clear to auscultation bilaterally.  CVS: S1, S2 no murmurs, no S3.Regular rate.  ABD: Soft non tender.   Ext: No edema  MS: Adequate ROM spine, shoulders, hips and knees.  Skin: Intact, no ulcerations or rash noted.  Psych: Good eye contact, normal affect. Memory intact not  anxious or depressed appearing.  CNS: CN 2-12 intact, power,  normal throughout.no focal deficits noted.   Assessment & Plan  Type 2 diabetes mellitus with other specified complication (HCC) Diabetes associated with hypertension and hyperlipidemia Uncontrolled , maarked fluctuation in blood sugar with episodess of hypoglycemia, needs management by Endo and she is ready for this has requested in the past also needs to continue siabetic ed,missed recent appointment  [name] is reminded of the importance of commitment to daily physical activity for 30 minutes or more, as able and the need to limit carbohydrate intake to 30 to 60 grams per meal to help with blood sugar control.   The need to take medication as prescribed, test blood sugar as directed, and to call between visits if there is a concern that blood sugar is uncontrolled is also discussed.   [name] is reminded of the importance of daily foot exam, annual eye examination, and good blood sugar, blood pressure and cholesterol control.     Latest Ref Rng & Units 04/20/2023    3:02 PM 10/01/2022    4:07 PM 09/29/2022   11:19 AM 09/25/2022    6:07 PM 05/04/2022    3:48 PM  Diabetic Labs  HbA1c 4.8 - 5.6 % 7.4   7.1   8.2   Chol 100 - 199 mg/dL  409      HDL >81 mg/dL  96      Calc LDL 0 -  99 mg/dL  91      Triglycerides 0 - 149 mg/dL  77      Creatinine 1.61 - 1.00 mg/dL 0.96  0.45   4.09  8.11       05/12/2023    2:25 PM 04/20/2023    2:06 PM 02/10/2023   11:36 AM 12/09/2022    2:22 PM 12/09/2022    1:19 PM 11/23/2022    8:53 AM 11/23/2022    7:24 AM  BP/Weight  Systolic BP 132 130 122 167  141 140  Diastolic BP 71 80 75 95  85 84  Wt. (Lbs) 131.4 133.4   136    BMI 22.55 kg/m2 23.63 kg/m2   24.09 kg/m2        Latest Ref Rng & Units 10/27/2022   12:00 AM 08/21/2021    8:40 AM  Foot/eye exam completion dates  Eye Exam No Retinopathy No Retinopathy       Foot Form Completion   Done     This result is from an external  source.        Medically complex patient Referral to [facility] nurse management  Hyperlipidemia Hyperlipidemia:Low fat diet discussed and encouraged.   Lipid Panel  Lab Results  Component Value Date   CHOL 201 (H) 10/01/2022   HDL 96 10/01/2022   LDLCALC 91 10/01/2022   TRIG 77 10/01/2022   CHOLHDL 2.1 10/01/2022     Updated lab needed at/ before next visit.   Vertigo Meclizine as needed for symptom control  Carotid bruit present Recurrent light headedness and bruit doppler US to further assess  Vitamin D deficiency Updated lab needed at/ before next visit.   Essential hypertension Controlled, no change in medication

## 2023-05-13 NOTE — Assessment & Plan Note (Signed)
 Updated lab needed at/ before next visit.

## 2023-05-13 NOTE — Assessment & Plan Note (Signed)
 Meclizine as needed for symptom control

## 2023-05-13 NOTE — Assessment & Plan Note (Signed)
 Referral to [facility] nurse management

## 2023-05-13 NOTE — Assessment & Plan Note (Signed)
 Hyperlipidemia:Low fat diet discussed and encouraged.   Lipid Panel  Lab Results  Component Value Date   CHOL 201 (H) 10/01/2022   HDL 96 10/01/2022   LDLCALC 91 10/01/2022   TRIG 77 10/01/2022   CHOLHDL 2.1 10/01/2022     Updated lab needed at/ before next visit.

## 2023-05-13 NOTE — Progress Notes (Signed)
 Complex Care Management Note Care Guide Note  05/13/2023 Name: [name] MRN: [id number] DOB: [birth year]   Complex Care Management Outreach Attempts: An unsuccessful telephone outreach was attempted today to offer the patient information about available complex care management services.  Follow Up Plan:  Additional outreach attempts will be made to offer the patient complex care management information and services.   Encounter Outcome:  No Answer  Gwenevere Ghazi  [facility]  [facility], [facility] Guide  Direct Dial: [phone]  Fax [phone]

## 2023-05-13 NOTE — Assessment & Plan Note (Signed)
 Recurrent light headedness and bruit doppler US to further assess

## 2023-05-13 NOTE — Assessment & Plan Note (Signed)
 Controlled, no change in medication

## 2023-05-13 NOTE — Assessment & Plan Note (Signed)
 Dexa overdue , needs to be scheduled

## 2023-05-16 LAB — MICROALBUMIN / CREATININE URINE RATIO
Creatinine, Urine: 119.2 mg/dL
Microalb/Creat Ratio: 65 mg/g{creat} — ABNORMAL HIGH (ref 0–29)
Microalbumin, Urine: 76.9 ug/mL

## 2023-05-19 ENCOUNTER — Other Ambulatory Visit: Payer: Self-pay | Admitting: Family Medicine

## 2023-05-19 DIAGNOSIS — R42 Dizziness and giddiness: Secondary | ICD-10-CM

## 2023-05-19 NOTE — Progress Notes (Signed)
 Complex Care Management Note  Care Guide Note 05/19/2023 Name: [name] MRN: [id number] DOB: November 09, 1950  [name] is a 73 y.o. year old female who sees Kerri Perches, MD for primary care. I reached out to [employer] by phone today to offer complex care management services.  [name] was given information about Complex Care Management services today including:   The Complex Care Management services include support from the care team which includes your Nurse Care Manager, Clinical Social Worker, or Pharmacist.  The Complex Care Management team is here to help remove barriers to the health concerns and goals most important to you. Complex Care Management services are voluntary, and the patient may decline or stop services at any time by request to their care team member.   Complex Care Management Consent Status: Patient agreed to services and verbal consent obtained.   Follow up plan:  Telephone appointment with complex care management team member scheduled for:  06/07/23  Encounter Outcome:  Patient Scheduled  Gwenevere Ghazi  [facility]  [facility], [facility] Guide  Direct Dial: [phone]  Fax [phone]

## 2023-05-20 ENCOUNTER — Other Ambulatory Visit: Payer: Self-pay

## 2023-05-20 DIAGNOSIS — R42 Dizziness and giddiness: Secondary | ICD-10-CM

## 2023-05-20 MED ORDER — MECLIZINE HCL 12.5 MG PO TABS: ORAL_TABLET | ORAL | 0 refills | Status: DC

## 2023-05-24 ENCOUNTER — Ambulatory Visit: Payer: No Typology Code available for payment source | Admitting: Nurse Practitioner

## 2023-05-24 DIAGNOSIS — E559 Vitamin D deficiency, unspecified: Secondary | ICD-10-CM

## 2023-05-24 DIAGNOSIS — E782 Mixed hyperlipidemia: Secondary | ICD-10-CM

## 2023-05-24 DIAGNOSIS — Z7984 Long term (current) use of oral hypoglycemic drugs: Secondary | ICD-10-CM

## 2023-05-24 DIAGNOSIS — Z794 Long term (current) use of insulin: Secondary | ICD-10-CM

## 2023-05-24 DIAGNOSIS — I10 Essential (primary) hypertension: Secondary | ICD-10-CM

## 2023-05-24 NOTE — Progress Notes (Deleted)
 Endocrinology Consult Note       05/24/2023, 7:10 AM   Subjective:    Patient ID: [name], female    DOB: 1950/04/26.  [name] is being seen in consultation for management of currently uncontrolled symptomatic diabetes requested by  Kerri Perches, MD.   Past Medical History:  Diagnosis Date   BACK PAIN, THORACIC REGION, RIGHT 01/27/2010   Qualifier: Diagnosis of  By: Lodema Hong MD, Gwendalyn Ege of foot 07/15/2017   Dysrhythmia    Essential hypertension    Headache(784.0)    Hematuria 02/24/2016   History of kidney stones    Hyperlipidemia    Irregular heart beats    Microscopic hematuria 02/24/2016   Neck pain on right side 01/23/2013   Piles (hemorrhoids) 06/22/2011   Renal calculus, right 05/31/2016   Shoulder pain, right 01/25/2014   SKIN TAG 06/13/2008   Qualifier: Diagnosis of  By: Lodema Hong MD, Margaret     TIA (transient ischemic attack) 06/19/2016   Type 2 diabetes mellitus (HCC)    Vaginitis     Past Surgical History:  Procedure Laterality Date   COLONOSCOPY N/A 10/26/2018   Procedure: COLONOSCOPY;  Surgeon: Malissa Hippo, MD;  [facility]: [facility];  Craig: Endoscopy;  Laterality: N/A;  830-10:30am   COLONOSCOPY WITH PROPOFOL N/A 09/17/2022   Procedure: COLONOSCOPY WITH PROPOFOL;  Surgeon: Dolores Frame, MD;  Location: [facility];  Craig: Gastroenterology;  Laterality: N/A;  8:30AM;ASA 1-2   cyst removed Right 1998   cyst- removed from right wrist   CYSTOSCOPY WITH RETROGRADE PYELOGRAM, URETEROSCOPY AND STENT PLACEMENT Right 05/19/2020   Procedure: CYSTOSCOPY WITH RETROGRADE PYELOGRAM, URETEROSCOPY AND STENT PLACEMENT;  Surgeon: Malen Gauze, MD;  Location: [facility];  Craig: Urology;  Laterality: Right;   CYSTOSCOPY WITH RETROGRADE PYELOGRAM, URETEROSCOPY AND STENT PLACEMENT Right 06/26/2020   Procedure: CYSTOSCOPY WITH RETROGRADE  PYELOGRAM, URETEROSCOPY AND STENT EXCHANGE;  Surgeon: Malen Gauze, MD;  Location: [facility];  Craig: Urology;  Laterality: Right;   EXTRACORPOREAL SHOCK WAVE LITHOTRIPSY Right 05/29/2018   Procedure: EXTRACORPOREAL SHOCK WAVE LITHOTRIPSY (ESWL);  Surgeon: Ihor Gully, MD;  Location: [facility];  Craig: Urology;  Laterality: Right;   EXTRACORPOREAL SHOCK WAVE LITHOTRIPSY Right 11/23/2022   Procedure: EXTRACORPOREAL SHOCK WAVE LITHOTRIPSY (ESWL);  Surgeon: Malen Gauze, MD;  Location: [facility];  Craig: Urology;  Laterality: Right;   HOLMIUM LASER APPLICATION Right 06/26/2020   Procedure: HOLMIUM LASER APPLICATION;  Surgeon: Malen Gauze, MD;  Location: [facility];  Craig: Urology;  Laterality: Right;   lithotrpsy     over 20 years   NEPHROLITHOTOMY Right 05/31/2016   Procedure: NEPHROLITHOTOMY PERCUTANEOUS WITH SURGEON ACCESS;  Surgeon: Malen Gauze, MD;  Location: [facility];  Craig: Urology;  Laterality: Right;   POLYPECTOMY  10/26/2018   Procedure: POLYPECTOMY;  Surgeon: Malissa Hippo, MD;  [facility];  Craig: Endoscopy;;  colon   POLYPECTOMY  09/17/2022   Procedure: POLYPECTOMY;  Surgeon: Dolores Frame, MD;  [facility];  Craig: Gastroenterology;;   TOTAL ABDOMINAL HYSTERECTOMY W/ BILATERAL SALPINGOOPHORECTOMY  1998   TUBAL LIGATION      Social History  Socioeconomic History   Marital status: Single    Spouse name: Not on file   Number of children: 1   Years of education: Not on file   Highest education level: 12th grade  Occupational History   Occupation: employed   Tobacco Use   Smoking status: Never   Smokeless tobacco: Never  Vaping Use   Vaping status: Never Used  Substance and Sexual Activity   Alcohol use: No   Drug use: No   Sexual activity: Not Currently  Other Topics Concern   Not on file  Social History Narrative   Not on file   Social Drivers of Health   Financial Resource Strain: Low Risk   (02/16/2022)   Overall Financial Resource Strain (CARDIA)    Difficulty of Paying Living Expenses: Not hard at all  Food Insecurity: No Food Insecurity (02/16/2022)   Hunger Vital Sign    Worried About Running Out of Food in the Last Year: Never true    Ran Out of Food in the Last Year: Never true  Transportation Needs: No Transportation Needs (02/16/2022)   PRAPARE - Administrator, Civil Craig (Medical): No    Lack of Transportation (Non-Medical): No  Physical Activity: Sufficiently Active (02/16/2022)   Exercise Vital Sign    Days of Exercise per Week: 3 days    Minutes of Exercise per Session: 50 min  Stress: No Stress Concern Present (02/16/2022)   [employer] of Occupational Health - Occupational Stress Questionnaire    Feeling of Stress : Only a little  Social Connections: Moderately Isolated (02/16/2022)   Social Connection and Isolation Panel [NHANES]    Frequency of Communication with Friends and Family: Twice a week    Frequency of Social Gatherings with Friends and Family: Three times a week    Attends Religious Services: 1 to 4 times per year    Active Member of Clubs or Organizations: No    Attends Banker Meetings: Never    Marital Status: Separated    Family History  Problem Relation Age of Onset   Alcohol abuse Mother    Esophageal cancer Father    Alcohol abuse Father    Diabetes Sister    Diabetes Sister    Hypertension Sister    Heart attack Sister        in 38's   Seizures Brother     Outpatient Encounter Medications as of 05/24/2023  Medication Sig   ACCU-CHEK GUIDE TEST test strip 1 EACH BY IN VITRO ROUTE IN THE MORNING, AT NOON, AND AT BEDTIME. MAY SUBSTITUTE TO ANY MANUFACTURER COVERED BY PATIENT'S INSURANCE.   acetaminophen (TYLENOL) 500 MG tablet Take 1,000 mg by mouth every 6 (six) hours as needed for headache or moderate pain.   amLODipine (NORVASC) 5 MG tablet TAKE 1 TABLET (5 MG TOTAL) BY MOUTH DAILY.    aspirin EC 81 MG tablet Take 81 mg by mouth daily.   atorvastatin (LIPITOR) 10 MG tablet TAKE ONE TABLET BY MOUTH EVERY MON, WED, AND FRI, AND HALF A TABLET TUES, THURS, SAT, AND SUN   B-D ULTRAFINE III SHORT PEN 31G X 8 MM MISC FOR USE WITH INSULIN ONCE DAILY DX E11.9   Cholecalciferol (VITAMIN D3) 50 MCG (2000 UT) TABS Take 2,000 Units by mouth daily.   Continuous Glucose Receiver (DEXCOM G7 RECEIVER) DEVI Use to check blood sugar daily. DX : E11.69 (Patient not taking: Reported on 05/12/2023)   Continuous Glucose Sensor (DEXCOM G7 SENSOR) MISC Use  to check blood sugar daily. DX: E11.69 (Patient not taking: Reported on 05/12/2023)   estradiol (ESTRACE) 0.1 MG/GM vaginal cream Discard plastic applicator. Insert a blueberry size amount (approximately 1 gram) of cream on fingertip inside vagina at bedtime every night for 1 week then every other night. For long term use.   ferrous sulfate 325 (65 FE) MG EC tablet TAKE 1 TABLET BY MOUTH TWICE A DAY WITH FOOD   Glucagon (GVOKE HYPOPEN 2-PACK) 1 MG/0.2ML SOAJ Inject 1 mg into the skin as needed (For blood glucose less than 53). (Patient not taking: Reported on 05/12/2023)   glucose 4 GM chewable tablet Chew 1 tablet by mouth as needed for low blood sugar. (Patient not taking: Reported on 05/12/2023)   insulin glargine (LANTUS SOLOSTAR) 100 UNIT/ML Solostar Pen Inject 30 Units into the skin at bedtime.   meclizine (ANTIVERT) 12.5 MG tablet TAKE 1 TABLET BY MOUTH 3 TIMES DAILY AS NEEDED FOR DIZZINESS.   metFORMIN (GLUCOPHAGE) 1000 MG tablet TAKE 1 TABLET BY MOUTH TWICE A DAY WITH FOOD   olmesartan (BENICAR) 20 MG tablet TAKE 1 TABLET BY MOUTH EVERY DAY   No facility-administered encounter medications on file as of 05/24/2023.    ALLERGIES: Allergies  Allergen Reactions   Onglyza [Saxagliptin Hydrochloride] Swelling    Tongue swell   Pravastatin Other (See Comments)    Muscle aches   Ace Inhibitors Cough    VACCINATION STATUS: Immunization History   Administered Date(s) Administered   Fluad Quad(high Dose 65+) 12/25/2020, 12/22/2021   Fluad Trivalent(High Dose 65+) 11/12/2022   Influenza Split 04/24/2012   Influenza,inj,Quad PF,6+ Mos 01/25/2014, 03/14/2015, 05/24/2016, 12/27/2016, 11/15/2017, 12/12/2018, 12/04/2019   PFIZER(Purple Top)SARS-COV-2 Vaccination 04/18/2019, 05/09/2019, 12/20/2019   Pfizer Covid-19 Vaccine Bivalent Booster 38yrs & up 12/08/2020   Pneumococcal Conjugate-13 05/24/2016   Pneumococcal Polysaccharide-23 12/26/2017   Tdap 04/24/2012   Zoster, Live 09/13/2013    Diabetes She presents for her initial diabetic visit. She has type 2 diabetes mellitus. Her disease course has been fluctuating. Hypoglycemia symptoms include nervousness/anxiousness, sweats and tremors. Associated symptoms include fatigue. Hypoglycemia complications include blackouts and required assistance. Diabetic complications include nephropathy. Risk factors for coronary artery disease include diabetes mellitus, hypertension, sedentary lifestyle and post-menopausal. Current diabetic treatment includes insulin injections and oral agent (monotherapy). She is compliant with treatment most of the time. Her weight is fluctuating minimally. She is following a generally unhealthy diet. When asked about meal planning, she reported none. She has not had a previous visit with a dietitian. She participates in exercise intermittently. An ACE inhibitor/angiotensin II receptor blocker is being taken. She does not see a podiatrist.Eye exam is current.     Review of systems  Constitutional: + Minimally fluctuating body weight, current There is no height or weight on file to calculate BMI., no fatigue, no subjective hyperthermia, no subjective hypothermia Eyes: no blurry vision, no xerophthalmia ENT: no sore throat, no nodules palpated in throat, no dysphagia/odynophagia, no hoarseness Cardiovascular: no chest pain, no shortness of breath, no palpitations, no leg  swelling Respiratory: no cough, no shortness of breath Gastrointestinal: no nausea/vomiting/diarrhea Musculoskeletal: no muscle/joint aches Skin: no rashes, no hyperemia Neurological: no tremors, no numbness, no tingling, no dizziness Psychiatric: no depression, no anxiety  Objective:     There were no vitals taken for this visit.  Wt Readings from Last 3 Encounters:  05/12/23 131 lb 6.4 oz (59.6 kg)  04/20/23 133 lb 6.4 oz (60.5 kg)  12/09/22 136 lb (61.7 kg)  BP Readings from Last 3 Encounters:  05/12/23 132/71  04/20/23 130/80  02/10/23 122/75     Physical Exam- Limited  Constitutional:  There is no height or weight on file to calculate BMI. , not in acute distress, normal state of mind Eyes:  EOMI, no exophthalmos Neck: Supple Cardiovascular: RRR, no murmurs, rubs, or gallops, no edema Respiratory: Adequate breathing efforts, no crackles, rales, rhonchi, or wheezing Musculoskeletal: no gross deformities, strength intact in all four extremities, no gross restriction of joint movements Skin:  no rashes, no hyperemia Neurological: no tremor with outstretched hands   Diabetic Foot Exam - Simple   No data filed      CMP ( most recent) CMP     Component Value Date/Time   NA 143 04/20/2023 1502   K 4.5 04/20/2023 1502   CL 101 04/20/2023 1502   CO2 26 04/20/2023 1502   GLUCOSE 207 (H) 04/20/2023 1502   GLUCOSE 48 (L) 09/25/2022 1807   BUN 17 04/20/2023 1502   CREATININE 1.09 (H) 04/20/2023 1502   CREATININE 0.56 (L) 06/29/2019 1303   CALCIUM 10.5 (H) 04/20/2023 1502   PROT 7.0 04/20/2023 1502   ALBUMIN 4.6 04/20/2023 1502   AST 14 04/20/2023 1502   ALT 9 04/20/2023 1502   ALKPHOS 128 (H) 04/20/2023 1502   BILITOT 0.4 04/20/2023 1502   EGFR 54 (L) 04/20/2023 1502   GFRNONAA >60 09/25/2022 1807   GFRNONAA 94 06/29/2019 1303     Diabetic Labs (most recent): Lab Results  Component Value Date   HGBA1C 7.4 (H) 04/20/2023   HGBA1C 7.1 (A) 09/29/2022    HGBA1C 8.2 (H) 05/04/2022   MICROALBUR 304.0 (H) 12/04/2019   MICROALBUR 2.3 11/16/2018   MICROALBUR 17.9 (H) 07/13/2017     Lipid Panel ( most recent) Lipid Panel     Component Value Date/Time   CHOL 201 (H) 10/01/2022 1607   TRIG 77 10/01/2022 1607   HDL 96 10/01/2022 1607   CHOLHDL 2.1 10/01/2022 1607   CHOLHDL 2.5 06/29/2019 1303   VLDL 10 09/30/2016 0917   LDLCALC 91 10/01/2022 1607   LDLCALC 116 (H) 06/29/2019 1303   LABVLDL 14 10/01/2022 1607      Lab Results  Component Value Date   TSH 0.917 10/01/2022   TSH 0.896 08/24/2021   TSH 0.72 02/20/2018   TSH 0.797 03/20/2010   TSH 1.938 10/11/2008           Assessment & Plan:   1) Type 2 diabetes mellitus with hypoglycemia without coma, with long-term current use of insulin (HCC) (Primary)    - [name] has currently uncontrolled symptomatic type 2 DM since *** years of age, with most recent A1c of 7.4 %.   -Recent labs reviewed.  - I had a long discussion with her about the progressive nature of diabetes and the pathology behind its complications. -her diabetes is complicated by hypoglycemia and CKD stage 3a and she remains at a high risk for more acute and chronic complications which include CAD, CVA, CKD, retinopathy, and neuropathy. These are all discussed in detail with her.  The following Lifestyle Medicine recommendations according to American College of Lifestyle Medicine [facility]) were discussed and offered to patient and she agrees to start the journey:  A. Whole Foods, Plant-based plate comprising of fruits and vegetables, plant-based proteins, whole-grain carbohydrates was discussed in detail with the patient.   A list for source of those nutrients were also provided to the patient.  Patient will use only  water or unsweetened tea for hydration. B.  The need to stay away from risky substances including alcohol, smoking; obtaining 7 to 9 hours of restorative sleep, at least 150 minutes of moderate  intensity exercise weekly, the importance of healthy social connections,  and stress reduction techniques were discussed. C.  A full color page of  Calorie density of various food groups per pound showing examples of each food groups was provided to the patient.  - I have counseled her on diet and weight management by adopting a carbohydrate restricted/protein rich diet. Patient is encouraged to switch to unprocessed or minimally processed complex starch and increased protein intake (animal or plant source), fruits, and vegetables. -  she is advised to stick to a routine mealtimes to eat 3 meals a day and avoid unnecessary snacks (to snack only to correct hypoglycemia).   - she acknowledges that there is a room for improvement in her food and drink choices. - Suggestion is made for her to avoid simple carbohydrates from her diet including Cakes, Sweet Desserts, Ice Cream, Soda (diet and regular), Sweet Tea, Candies, Chips, Cookies, Store Bought Juices, Alcohol in Excess of 1-2 drinks a day, Artificial Sweeteners, Coffee Creamer, and "Sugar-free" Products. This will help patient to have more stable blood glucose profile and potentially avoid unintended weight gain.  - I have approached her with the following individualized plan to manage her diabetes and patient agrees:    -she is encouraged to start/continue monitoring glucose 2 times daily, before breakfast and before bed, to log their readings on the clinic sheets provided, and bring them to review at follow up appointment in 3 months.  She could certainly benefit from CGM device given her severe hypoglycemia episodes lower than 30 in the past.  - she is warned not to take insulin without proper monitoring per orders. - Adjustment parameters are given to her for hypo and hyperglycemia in writing. - she is encouraged to call clinic for blood glucose levels less than 70 or above 300 mg /dl.  - she is not a candidate for full dose Metformin due to  concurrent renal insufficiency, therefore will lower her Metformin to 500 mg po twice daily.  - she is not a candidate for incretin therapy given her body habitus and BMI less than 25.  - Specific targets for  A1c; LDL, HDL, and Triglycerides were discussed with the patient.  2) Blood Pressure /Hypertension:  her blood pressure is controlled to target.   she is advised to continue her current medications as prescribed by her PCP.  3) Lipids/Hyperlipidemia:    There is no recent lipid panel available to review.  she is advised to continue Lipitor 10 mg daily at bedtime.  Side effects and precautions discussed with her.  4)  Weight/Diet:  her There is no height or weight on file to calculate BMI.  - she is NOT a candidate for weight loss.  Exercise, and detailed carbohydrates information provided  -  detailed on discharge instructions.  5) Chronic Care/Health Maintenance: -she is on ACEI/ARB and Statin medications and is encouraged to initiate and continue to follow up with Ophthalmology, Dentist, Podiatrist at least yearly or according to recommendations, and advised to stay away from smoking. I have recommended yearly flu vaccine and pneumonia vaccine at least every 5 years; moderate intensity exercise for up to 150 minutes weekly; and sleep for at least 7 hours a day.  - she is advised to maintain close follow up with Syliva Overman  E, MD for primary care needs, as well as her other providers for optimal and coordinated care.   - Time spent in this patient care: 60 min, of which > 50% was spent in counseling her about her diabetes and the rest reviewing her blood glucose logs, discussing her hypoglycemia and hyperglycemia episodes, reviewing her current and previous labs/studies (including abstraction from other facilities) and medications doses and developing a long term treatment plan based on the latest standards of care/guidelines; and documenting her care.    Please refer to Patient  Instructions for Blood Glucose Monitoring and Insulin/Medications Dosing Guide" in media tab for additional information. Please also refer to "Patient Self Inventory" in the Media tab for reviewed elements of pertinent patient history.  [name] participated in the discussions, expressed understanding, and voiced agreement with the above plans.  All questions were answered to her satisfaction. she is encouraged to contact clinic should she have any questions or concerns prior to her return visit.     Follow up plan: - No follow-ups on file.    Ronny Bacon, [facility] [address] [place], [place] [address] Phone: [phone] Fax: [phone]  05/24/2023, 7:10 AM

## 2023-06-07 ENCOUNTER — Telehealth: Payer: Self-pay | Admitting: *Deleted

## 2023-06-07 ENCOUNTER — Other Ambulatory Visit: Payer: Self-pay | Admitting: *Deleted

## 2023-06-07 NOTE — Patient Outreach (Signed)
  Care Management   Follow Up Note   06/07/2023 Name: [name] MRN: [id number] DOB: [birth year]   Referred by: Kerri Perches, MD Reason for referral : Care Management (RNCM: ATTEMPTED Initial Outreach For Complex Care Management )   An unsuccessful telephone outreach was attempted today. The patient was referred to the case management team for assistance with care management and care coordination.   Follow Up Plan: The care management team will reach out to the patient again over the next 30 days.   Danise Edge, BSN RN [facility], [facility] RN Care Manager Direct Dial: [phone]  Fax: [phone]

## 2023-06-09 ENCOUNTER — Ambulatory Visit: Payer: 59 | Admitting: Urology

## 2023-06-14 ENCOUNTER — Ambulatory Visit (HOSPITAL_COMMUNITY)
Admission: RE | Admit: 2023-06-14 | Discharge: 2023-06-14 | Disposition: A | Discharge status: Home / Self Care | Source: Ambulatory Visit | Attending: Urology | Admitting: Urology

## 2023-06-14 DIAGNOSIS — N200 Calculus of kidney: Secondary | ICD-10-CM | POA: Diagnosis not present

## 2023-06-15 ENCOUNTER — Encounter: Payer: Self-pay | Admitting: Urology

## 2023-06-15 ENCOUNTER — Ambulatory Visit (INDEPENDENT_AMBULATORY_CARE_PROVIDER_SITE_OTHER): Admitting: Urology

## 2023-06-15 VITALS — BP 146/76 | HR 102

## 2023-06-15 DIAGNOSIS — Z87440 Personal history of urinary (tract) infections: Secondary | ICD-10-CM | POA: Diagnosis not present

## 2023-06-15 DIAGNOSIS — N200 Calculus of kidney: Secondary | ICD-10-CM

## 2023-06-15 DIAGNOSIS — N390 Urinary tract infection, site not specified: Secondary | ICD-10-CM

## 2023-06-15 LAB — URINALYSIS, ROUTINE W REFLEX MICROSCOPIC
Bilirubin, UA: NEGATIVE
Glucose, UA: NEGATIVE
Nitrite, UA: NEGATIVE
Specific Gravity, UA: 1.03 (ref 1.005–1.030)
Urobilinogen, Ur: 0.2 mg/dL (ref 0.2–1.0)
pH, UA: 6 (ref 5.0–7.5)

## 2023-06-15 LAB — MICROSCOPIC EXAMINATION
RBC, Urine: >30 /HPF — AB (ref 0–2)
WBC, UA: >30 /HPF — AB (ref 0–5)

## 2023-06-15 LAB — BLADDER SCAN AMB NON-IMAGING: Scan Result: 1

## 2023-06-15 NOTE — Progress Notes (Signed)
 Name: [name] DOB: 26-Dec-1950 MRN: [id number]  History of Present Illness: [name] is a 73 y.o. female who presents today for follow up visit at [facility] [place]. GU history includes: 1. Recurrent UTI. 2. Kidney stones. - 11/23/2022 : Underwent right ESWL procedure by Dr. Ronne Binning. - 12/09/2022: Stone analysis showed 100% calcium oxalate composition.   Urine culture results in past 12 months: - 09/25/2022: Negative - 10/04/2022: Positive for Enterococcus faecalis - 11/01/2022: Positive for Enterococcus faecalis - 11/18/2022: Positive for Enterococcus faecalis - 02/10/2023: Negative  At last visit on 02/10/2023: > Reported intermittent urinary urgency, frequency, nocturia, and dysuria.  > The plan was: 1. For suspected acute UTI: - Urine culture (negative). - Empiric treatment for suspected UTI with Macrobid. 2. For UTI prevention: - Nitrofurantoin 50 mg daily. - Topical vaginal estrogen cream as prescribed. - Maintain adequate fluid intake daily to flush out the urinary tract. - Urinate every 4-6 hours while awake to minimize urinary stasis / bacterial overgrowth in the bladder. - Consider OTC supplements for UTI prevention. 3. Return in 3 months (on 05/11/2023) for UA, PVR, & f/u with Evette Georges NP for stone & rUTI; needs KUB prior to visit.  Today: KUB today: Awaiting radiology read; bilateral kidney stones appreciated per provider interpretation (largest is a 9 mm left lower pole stone).  She denies increased urinary urgency, frequency, dysuria, gross hematuria, straining to void, or sensations of incomplete emptying.  Denies any UTIs in the past few months despite never starting Macrodantin (Nitrofurantoin) 50 mg daily for UTI prophylaxis as previously planned.   She denies flank pain or abdominal pain. Denies recent stone passage.  She reports using vaginal estrogen cream sporadically.   Medications: Current Outpatient Medications   Medication Sig Dispense Refill   ACCU-CHEK GUIDE TEST test strip 1 EACH BY IN VITRO ROUTE IN THE MORNING, AT NOON, AND AT BEDTIME. MAY SUBSTITUTE TO ANY MANUFACTURER COVERED BY PATIENT'S INSURANCE. 100 strip 0   acetaminophen (TYLENOL) 500 MG tablet Take 1,000 mg by mouth every 6 (six) hours as needed for headache or moderate pain.     amLODipine (NORVASC) 5 MG tablet TAKE 1 TABLET (5 MG TOTAL) BY MOUTH DAILY. 90 tablet 0   aspirin EC 81 MG tablet Take 81 mg by mouth daily.     atorvastatin (LIPITOR) 10 MG tablet TAKE ONE TABLET BY MOUTH EVERY MON, WED, AND FRI, AND HALF A TABLET TUES, THURS, SAT, AND SUN 60 tablet 3   B-D ULTRAFINE III SHORT PEN 31G X 8 MM MISC FOR USE WITH INSULIN ONCE DAILY DX E11.9 100 each 5   Cholecalciferol (VITAMIN D3) 50 MCG (2000 UT) TABS Take 2,000 Units by mouth daily.     Continuous Glucose Receiver (DEXCOM G7 RECEIVER) DEVI Use to check blood sugar daily. DX : E11.69 1 each 0   Continuous Glucose Sensor (DEXCOM G7 SENSOR) MISC Use to check blood sugar daily. DX: E11.69 1 each 3   estradiol (ESTRACE) 0.1 MG/GM vaginal cream Discard plastic applicator. Insert a blueberry size amount (approximately 1 gram) of cream on fingertip inside vagina at bedtime every night for 1 week then every other night. For long term use. 30 g 3   ferrous sulfate 325 (65 FE) MG EC tablet TAKE 1 TABLET BY MOUTH TWICE A DAY WITH FOOD 180 tablet 2   Glucagon (GVOKE HYPOPEN 2-PACK) 1 MG/0.2ML SOAJ Inject 1 mg into the skin as needed (For blood glucose less than 53). 0.4 mL 2  glucose 4 GM chewable tablet Chew 1 tablet by mouth as needed for low blood sugar.     insulin glargine (LANTUS SOLOSTAR) 100 UNIT/ML Solostar Pen Inject 30 Units into the skin at bedtime. 15 mL 5   meclizine (ANTIVERT) 12.5 MG tablet TAKE 1 TABLET BY MOUTH 3 TIMES DAILY AS NEEDED FOR DIZZINESS. 30 tablet 0   metFORMIN (GLUCOPHAGE) 1000 MG tablet TAKE 1 TABLET BY MOUTH TWICE A DAY WITH FOOD 180 tablet 1   olmesartan  (BENICAR) 20 MG tablet TAKE 1 TABLET BY MOUTH EVERY DAY 90 tablet 0   No current facility-administered medications for this visit.    Allergies: Allergies  Allergen Reactions   Onglyza [Saxagliptin Hydrochloride] Swelling    Tongue swell   Pravastatin Other (See Comments)    Muscle aches   Ace Inhibitors Cough    Past Medical History:  Diagnosis Date   BACK PAIN, THORACIC REGION, RIGHT 01/27/2010   Qualifier: Diagnosis of  By: Lodema Hong MD, Gwendalyn Ege of foot 07/15/2017   Dysrhythmia    Essential hypertension    Headache(784.0)    Hematuria 02/24/2016   History of kidney stones    Hyperlipidemia    Irregular heart beats    Microscopic hematuria 02/24/2016   Neck pain on right side 01/23/2013   Piles (hemorrhoids) 06/22/2011   Renal calculus, right 05/31/2016   Shoulder pain, right 01/25/2014   SKIN TAG 06/13/2008   Qualifier: Diagnosis of  By: Lodema Hong MD, Margaret     TIA (transient ischemic attack) 06/19/2016   Type 2 diabetes mellitus (HCC)    Vaginitis    Past Surgical History:  Procedure Laterality Date   COLONOSCOPY N/A 10/26/2018   Procedure: COLONOSCOPY;  Surgeon: Malissa Hippo, MD;  [facility];  Service: Endoscopy;  Laterality: N/A;  830-10:30am   COLONOSCOPY WITH PROPOFOL N/A 09/17/2022   Procedure: COLONOSCOPY WITH PROPOFOL;  Surgeon: Dolores Frame, MD;  Location: [facility];  Service: Gastroenterology;  Laterality: N/A;  8:30AM;ASA 1-2   cyst removed Right 1998   cyst- removed from right wrist   CYSTOSCOPY WITH RETROGRADE PYELOGRAM, URETEROSCOPY AND STENT PLACEMENT Right 05/19/2020   Procedure: CYSTOSCOPY WITH RETROGRADE PYELOGRAM, URETEROSCOPY AND STENT PLACEMENT;  Surgeon: Malen Gauze, MD;  Location: [facility];  Service: Urology;  Laterality: Right;   CYSTOSCOPY WITH RETROGRADE PYELOGRAM, URETEROSCOPY AND STENT PLACEMENT Right 06/26/2020   Procedure: CYSTOSCOPY WITH RETROGRADE PYELOGRAM, URETEROSCOPY AND STENT  EXCHANGE;  Surgeon: Malen Gauze, MD;  Location: [facility];  Service: Urology;  Laterality: Right;   EXTRACORPOREAL SHOCK WAVE LITHOTRIPSY Right 05/29/2018   Procedure: EXTRACORPOREAL SHOCK WAVE LITHOTRIPSY (ESWL);  Surgeon: Ihor Gully, MD;  Location: [facility];  Service: Urology;  Laterality: Right;   EXTRACORPOREAL SHOCK WAVE LITHOTRIPSY Right 11/23/2022   Procedure: EXTRACORPOREAL SHOCK WAVE LITHOTRIPSY (ESWL);  Surgeon: Malen Gauze, MD;  Location: [facility];  Service: Urology;  Laterality: Right;   HOLMIUM LASER APPLICATION Right 06/26/2020   Procedure: HOLMIUM LASER APPLICATION;  Surgeon: Malen Gauze, MD;  Location: [facility];  Service: Urology;  Laterality: Right;   lithotrpsy     over 20 years   NEPHROLITHOTOMY Right 05/31/2016   Procedure: NEPHROLITHOTOMY PERCUTANEOUS WITH SURGEON ACCESS;  Surgeon: Malen Gauze, MD;  Location: [facility];  Service: Urology;  Laterality: Right;   POLYPECTOMY  10/26/2018   Procedure: POLYPECTOMY;  Surgeon: Malissa Hippo, MD;  [facility];  Service: Endoscopy;;  colon   POLYPECTOMY  09/17/2022   Procedure: POLYPECTOMY;  Surgeon: Marguerita Merles, Reuel Boom, MD;  [facility]: [facility];  Service: Gastroenterology;;   TOTAL ABDOMINAL HYSTERECTOMY W/ BILATERAL SALPINGOOPHORECTOMY  1998   TUBAL LIGATION     Family History  Problem Relation Age of Onset   Alcohol abuse Mother    Esophageal cancer Father    Alcohol abuse Father    Diabetes Sister    Diabetes Sister    Hypertension Sister    Heart attack Sister        in 36's   Seizures Brother    Social History   Socioeconomic History   Marital status: Single    Spouse name: Not on file   Number of children: 1   Years of education: Not on file   Highest education level: 12th grade  Occupational History   Occupation: employed   Tobacco Use   Smoking status: Never   Smokeless tobacco: Never  Vaping Use   Vaping status: Never Used  Substance and Sexual Activity    Alcohol use: No   Drug use: No   Sexual activity: Not Currently  Other Topics Concern   Not on file  Social History Narrative   Not on file   Social Drivers of Health   Financial Resource Strain: Low Risk  (02/16/2022)   Overall Financial Resource Strain (CARDIA)    Difficulty of Paying Living Expenses: Not hard at all  Food Insecurity: No Food Insecurity (02/16/2022)   Hunger Vital Sign    Worried About Running Out of Food in the Last Year: Never true    Ran Out of Food in the Last Year: Never true  Transportation Needs: No Transportation Needs (02/16/2022)   PRAPARE - Administrator, Civil Service (Medical): No    Lack of Transportation (Non-Medical): No  Physical Activity: Sufficiently Active (02/16/2022)   Exercise Vital Sign    Days of Exercise per Week: 3 days    Minutes of Exercise per Session: 50 min  Stress: No Stress Concern Present (02/16/2022)   [employer] of Occupational Health - Occupational Stress Questionnaire    Feeling of Stress : Only a little  Social Connections: Moderately Isolated (02/16/2022)   Social Connection and Isolation Panel [NHANES]    Frequency of Communication with Friends and Family: Twice a week    Frequency of Social Gatherings with Friends and Family: Three times a week    Attends Religious Services: 1 to 4 times per year    Active Member of Clubs or Organizations: No    Attends Banker Meetings: Never    Marital Status: Separated  Intimate Partner Violence: Not At Risk (02/16/2022)   Humiliation, Afraid, Rape, and Kick questionnaire    Fear of Current or Ex-Partner: No    Emotionally Abused: No    Physically Abused: No    Sexually Abused: No    Review of Systems Constitutional: Patient denies any unintentional weight loss or change in strength lntegumentary: Patient denies any rashes or pruritus Cardiovascular: Patient denies chest pain or syncope Respiratory: Patient denies shortness of  breath Musculoskeletal: Patient denies muscle cramps or weakness Neurologic: Patient denies convulsions or seizures Allergic/Immunologic: Patient denies recent allergic reaction(s) Hematologic/Lymphatic: Patient denies bleeding tendencies Endocrine: Patient denies heat/cold intolerance  GU: As per HPI.  OBJECTIVE Vitals:   06/15/23 1421  BP: (!) 146/76  Pulse: (!) 102   There is no height or weight on file to calculate BMI.  Physical Examination Constitutional: No obvious distress; patient  is non-toxic appearing  Cardiovascular: No visible lower extremity edema.  Respiratory: The patient does not have audible wheezing/stridor; respirations do not appear labored  Gastrointestinal: Abdomen non-distended Musculoskeletal: Normal ROM of UEs  Skin: No obvious rashes/open sores  Neurologic: CN 2-12 grossly intact Psychiatric: Answered questions appropriately with normal affect  Hematologic/Lymphatic/Immunologic: No obvious bruises or sites of spontaneous bleeding  Urine microscopy: >30 WBC/hpf, >30 RBC/hpf, few bacteria PVR: 1 ml  ASSESSMENT Recurrent UTI - Plan: BLADDER SCAN AMB NON-IMAGING, Urinalysis, Routine w reflex microscopic  Nephrolithiasis - Plan: US RENAL, DG Abd 1 View  UA today appears abnormal however patient is asymptomatic for UTI, therefore acute antibiotic treatment is not advised at this time. Suspect vaginal contaminant of urine specimen.   For vaginal atrophy / UTI prevention she was advised to restart topical vaginal estrogen cream use 2-3 nights per week routinely.   Will plan for follow up in 6 months with RUS and KUB for stone surveillance or sooner if needed. Pt verbalized understanding and agreement. All questions were answered.   PLAN Advised the following: 1. Topical vaginal estrogen cream use 2-3 nights per week routinely.  2. Return in about 6 months (around 12/15/2023) for RUS, KUB, UA, PVR, & f/u with Evette Georges NP.  Orders Placed This  Encounter  Procedures   US RENAL    Standing Status:   Future    Expected Date:   12/15/2023    Expiration Date:   06/14/2024    Reason for Exam (SYMPTOM  OR DIAGNOSIS REQUIRED):   kidney stone known or suspected    Preferred imaging location?:   [facility]   DG Abd 1 View    Standing Status:   Future    Expected Date:   12/15/2023    Expiration Date:   06/14/2024    Reason for Exam (SYMPTOM  OR DIAGNOSIS REQUIRED):   kidney stone    Preferred imaging location?:   [facility]   Urinalysis, Routine w reflex microscopic   BLADDER SCAN AMB NON-IMAGING    It has been explained that the patient is to follow regularly with their PCP in addition to all other providers involved in their care and to follow instructions provided by these respective offices. Patient advised to contact urology clinic if any urologic-pertaining questions, concerns, new symptoms or problems arise in the interim period.  There are no Patient Instructions on file for this visit.  Electronically signed by:  Donnita Falls, FNP   06/15/23    2:40 PM

## 2023-06-26 ENCOUNTER — Other Ambulatory Visit: Payer: Self-pay | Admitting: Family Medicine

## 2023-06-27 ENCOUNTER — Telehealth: Payer: Self-pay

## 2023-06-27 NOTE — Progress Notes (Signed)
 Complex Care Management Care Guide Note  06/27/2023 Name: [name] MRN: [id number] DOB: 1950/09/04  [name] is a 73 y.o. year old female who is a primary care patient of Towanda Fret, MD and is actively engaged with the care management team. I reached out to [employer] by phone today to assist with re-scheduling  with the RN Case Manager.  Follow up plan: Unsuccessful telephone outreach attempt made. A HIPAA compliant phone message was left for the patient providing contact information and requesting a return call.  Lenton Rail , RMA      [facility]  [facility], [facility] Guide  Direct Dial: [phone]  Website: [email or web address]

## 2023-06-29 ENCOUNTER — Other Ambulatory Visit: Payer: Self-pay | Admitting: Family Medicine

## 2023-06-29 DIAGNOSIS — R42 Dizziness and giddiness: Secondary | ICD-10-CM

## 2023-06-29 NOTE — Progress Notes (Unsigned)
 Complex Care Management Care Guide Note  06/29/2023 Name: [name] MRN: [id number] DOB: Oct 02, 1950  [name] is a 73 y.o. year old female who is a primary care patient of Towanda Fret, MD and is actively engaged with the care management team. I reached out to [employer] by phone today to assist with re-scheduling  with the RN Case Manager.  Follow up plan: Unsuccessful telephone outreach attempt made. Patient requesting a return call at a later time  Barnie Bora  [facility], [facility] Guide  Direct Dial: [phone]  Fax [phone]

## 2023-06-30 NOTE — Progress Notes (Signed)
 Complex Care Management Care Guide Note  06/30/2023 Name: Nilda Keathley MRN: [id number] DOB: [birth year]  [name] is a 73 y.o. year old female who is a primary care patient of Towanda Fret, MD and is actively engaged with the care management team. I reached out to [employer] by phone today to assist with re-scheduling  with the RN Case Manager.  Follow up plan: Unsuccessful telephone outreach attempt made. A HIPAA compliant phone message was left for the patient providing contact information and requesting a return call.  Lenton Rail , RMA     [facility]  [facility], [place], [facility] Guide  Direct Dial: [phone]  Website: [email or web address]

## 2023-07-13 ENCOUNTER — Other Ambulatory Visit: Payer: Self-pay | Admitting: Family Medicine

## 2023-08-04 ENCOUNTER — Other Ambulatory Visit: Payer: Self-pay | Admitting: Family Medicine

## 2023-08-04 DIAGNOSIS — Z794 Long term (current) use of insulin: Secondary | ICD-10-CM

## 2023-08-19 ENCOUNTER — Encounter: Admitting: Family Medicine

## 2023-08-22 NOTE — Progress Notes (Signed)
   08/22/2023  Patient ID: [name], female   DOB: [birth year], 73 y.o.   MRN: [id number]  Pharmacy Quality Measure Review  This patient is appearing on a report for being at risk of failing the adherence measure for cholesterol (statin) medications this calendar year.   Medication: atorvastatin  10 mg last filled 06/16/23 90 DS  Insurance report was not up to date. No action needed at this time.   Rolando Cliche, PharmD, BCGP Clinical Pharmacist  [phone]

## 2023-08-30 NOTE — Patient Instructions (Incomplete)

## 2023-08-31 ENCOUNTER — Ambulatory Visit: Admitting: Nurse Practitioner

## 2023-08-31 DIAGNOSIS — E782 Mixed hyperlipidemia: Secondary | ICD-10-CM

## 2023-08-31 DIAGNOSIS — I10 Essential (primary) hypertension: Secondary | ICD-10-CM

## 2023-08-31 DIAGNOSIS — Z7984 Long term (current) use of oral hypoglycemic drugs: Secondary | ICD-10-CM

## 2023-08-31 DIAGNOSIS — Z794 Long term (current) use of insulin: Secondary | ICD-10-CM

## 2023-08-31 NOTE — Progress Notes (Deleted)
 Endocrinology Consult Note       08/31/2023, 7:37 AM   Subjective:    Patient ID: [name], female    DOB: 1951-01-20.  [name] is being seen in consultation for management of currently uncontrolled symptomatic diabetes requested by  Antonetta Rollene BRAVO, MD.   Past Medical History:  Diagnosis Date   BACK PAIN, THORACIC REGION, RIGHT 01/27/2010   Qualifier: Diagnosis of  By: Antonetta MD, Rollene Solomon of foot 07/15/2017   Dysrhythmia    Essential hypertension    Headache(784.0)    Hematuria 02/24/2016   History of kidney stones    Hyperlipidemia    Irregular heart beats    Microscopic hematuria 02/24/2016   Neck pain on right side 01/23/2013   Piles (hemorrhoids) 06/22/2011   Renal calculus, right 05/31/2016   Shoulder pain, right 01/25/2014   SKIN TAG 06/13/2008   Qualifier: Diagnosis of  By: Antonetta MD, Margaret     TIA (transient ischemic attack) 06/19/2016   Type 2 diabetes mellitus (HCC)    Vaginitis     Past Surgical History:  Procedure Laterality Date   COLONOSCOPY N/A 10/26/2018   Procedure: COLONOSCOPY;  Surgeon: Golda Claudis PENNER, MD;  [facility];  Service: Endoscopy;  Laterality: N/A;  830-10:30am   COLONOSCOPY WITH PROPOFOL  N/A 09/17/2022   Procedure: COLONOSCOPY WITH PROPOFOL ;  Surgeon: Eartha Angelia Sieving, MD;  [facility];  Service: Gastroenterology;  Laterality: N/A;  8:30AM;ASA 1-2   cyst removed Right 1998   cyst- removed from right wrist   CYSTOSCOPY WITH RETROGRADE PYELOGRAM, URETEROSCOPY AND STENT PLACEMENT Right 05/19/2020   Procedure: CYSTOSCOPY WITH RETROGRADE PYELOGRAM, URETEROSCOPY AND STENT PLACEMENT;  Surgeon: Sherrilee Belvie CROME, MD;  Location: [facility];  Service: Urology;  Laterality: Right;   CYSTOSCOPY WITH RETROGRADE PYELOGRAM, URETEROSCOPY AND STENT PLACEMENT Right 06/26/2020   Procedure: CYSTOSCOPY WITH RETROGRADE  PYELOGRAM, URETEROSCOPY AND STENT EXCHANGE;  Surgeon: Sherrilee Belvie CROME, MD;  Location: [facility];  Service: Urology;  Laterality: Right;   EXTRACORPOREAL SHOCK WAVE LITHOTRIPSY Right 05/29/2018   Procedure: EXTRACORPOREAL SHOCK WAVE LITHOTRIPSY (ESWL);  Surgeon: Ottelin, Mark, MD;  Location: [facility];  Service: Urology;  Laterality: Right;   EXTRACORPOREAL SHOCK WAVE LITHOTRIPSY Right 11/23/2022   Procedure: EXTRACORPOREAL SHOCK WAVE LITHOTRIPSY (ESWL);  Surgeon: Sherrilee Belvie CROME, MD;  Location: [facility];  Service: Urology;  Laterality: Right;   HOLMIUM LASER APPLICATION Right 06/26/2020   Procedure: HOLMIUM LASER APPLICATION;  Surgeon: Sherrilee Belvie CROME, MD;  Location: [facility];  Service: Urology;  Laterality: Right;   lithotrpsy     over 20 years   NEPHROLITHOTOMY Right 05/31/2016   Procedure: NEPHROLITHOTOMY PERCUTANEOUS WITH SURGEON ACCESS;  Surgeon: Belvie CROME Sherrilee, MD;  Location: [facility];  Service: Urology;  Laterality: Right;   POLYPECTOMY  10/26/2018   Procedure: POLYPECTOMY;  Surgeon: Golda Claudis PENNER, MD;  [facility];  Service: Endoscopy;;  colon   POLYPECTOMY  09/17/2022   Procedure: POLYPECTOMY;  Surgeon: Eartha Angelia Sieving, MD;  [facility];  Service: Gastroenterology;;   TOTAL ABDOMINAL HYSTERECTOMY W/ BILATERAL SALPINGOOPHORECTOMY  1998   TUBAL LIGATION      Social History  Socioeconomic History   Marital status: Single    Spouse name: Not on file   Number of children: 1   Years of education: Not on file   Highest education level: 12th grade  Occupational History   Occupation: employed   Tobacco Use   Smoking status: Never   Smokeless tobacco: Never  Vaping Use   Vaping status: Never Used  Substance and Sexual Activity   Alcohol use: No   Drug use: No   Sexual activity: Not Currently  Other Topics Concern   Not on file  Social History Narrative   Not on file   Social Drivers of Health   Financial Resource Strain: Low Risk   (02/16/2022)   Overall Financial Resource Strain (CARDIA)    Difficulty of Paying Living Expenses: Not hard at all  Food Insecurity: No Food Insecurity (02/16/2022)   Hunger Vital Sign    Worried About Running Out of Food in the Last Year: Never true    Ran Out of Food in the Last Year: Never true  Transportation Needs: No Transportation Needs (02/16/2022)   PRAPARE - Administrator, Civil Service (Medical): No    Lack of Transportation (Non-Medical): No  Physical Activity: Sufficiently Active (02/16/2022)   Exercise Vital Sign    Days of Exercise per Week: 3 days    Minutes of Exercise per Session: 50 min  Stress: No Stress Concern Present (02/16/2022)   [employer] of Occupational Health - Occupational Stress Questionnaire    Feeling of Stress : Only a little  Social Connections: Moderately Isolated (02/16/2022)   Social Connection and Isolation Panel    Frequency of Communication with Friends and Family: Twice a week    Frequency of Social Gatherings with Friends and Family: Three times a week    Attends Religious Services: 1 to 4 times per year    Active Member of Clubs or Organizations: No    Attends Banker Meetings: Never    Marital Status: Separated    Family History  Problem Relation Age of Onset   Alcohol abuse Mother    Esophageal cancer Father    Alcohol abuse Father    Diabetes Sister    Diabetes Sister    Hypertension Sister    Heart attack Sister        in 32's   Seizures Brother     Outpatient Encounter Medications as of 08/31/2023  Medication Sig   ACCU-CHEK GUIDE TEST test strip 1 EACH BY IN VITRO ROUTE IN THE MORNING, AT NOON, AND AT BEDTIME. MAY SUBSTITUTE TO ANY MANUFACTURER COVERED BY PATIENT'S INSURANCE.   acetaminophen  (TYLENOL ) 500 MG tablet Take 1,000 mg by mouth every 6 (six) hours as needed for headache or moderate pain.   amLODipine  (NORVASC ) 5 MG tablet TAKE 1 TABLET (5 MG TOTAL) BY MOUTH DAILY.   aspirin  EC 81  MG tablet Take 81 mg by mouth daily.   atorvastatin  (LIPITOR) 10 MG tablet TAKE ONE TABLET BY MOUTH EVERY MON, WED, AND FRI, AND HALF A TABLET TUES, THURS, SAT, AND SUN   B-D ULTRAFINE III SHORT PEN 31G X 8 MM MISC FOR USE WITH INSULIN  ONCE DAILY DX E11.9   Cholecalciferol (VITAMIN D3) 50 MCG (2000 UT) TABS Take 2,000 Units by mouth daily.   Continuous Glucose Receiver (DEXCOM G7 RECEIVER) DEVI Use to check blood sugar daily. DX : E11.69   Continuous Glucose Sensor (DEXCOM G7 SENSOR) MISC Use to check blood sugar daily. DX: E11.69  estradiol  (ESTRACE ) 0.1 MG/GM vaginal cream Discard plastic applicator. Insert a blueberry size amount (approximately 1 gram) of cream on fingertip inside vagina at bedtime every night for 1 week then every other night. For long term use.   ferrous sulfate  325 (65 FE) MG EC tablet TAKE 1 TABLET BY MOUTH TWICE A DAY WITH FOOD   Glucagon  (GVOKE HYPOPEN  2-PACK) 1 MG/0.2ML SOAJ Inject 1 mg into the skin as needed (For blood glucose less than 53).   glucose 4 GM chewable tablet Chew 1 tablet by mouth as needed for low blood sugar.   insulin  glargine (LANTUS  SOLOSTAR) 100 UNIT/ML Solostar Pen Inject 30 Units into the skin at bedtime.   meclizine  (ANTIVERT ) 12.5 MG tablet TAKE 1 TABLET BY MOUTH 3 TIMES DAILY AS NEEDED FOR DIZZINESS.   metFORMIN  (GLUCOPHAGE ) 1000 MG tablet TAKE 1 TABLET BY MOUTH TWICE A DAY WITH FOOD   olmesartan  (BENICAR ) 20 MG tablet TAKE 1 TABLET BY MOUTH EVERY DAY   No facility-administered encounter medications on file as of 08/31/2023.    ALLERGIES: Allergies  Allergen Reactions   Onglyza [Saxagliptin Hydrochloride] Swelling    Tongue swell   Pravastatin  Other (See Comments)    Muscle aches   Ace Inhibitors Cough    VACCINATION STATUS: Immunization History  Administered Date(s) Administered   Fluad Quad(high Dose 65+) 12/25/2020, 12/22/2021   Fluad Trivalent(High Dose 65+) 11/12/2022   Influenza Split 04/24/2012   Influenza,inj,Quad PF,6+  Mos 01/25/2014, 03/14/2015, 05/24/2016, 12/27/2016, 11/15/2017, 12/12/2018, 12/04/2019   PFIZER(Purple Top)SARS-COV-2 Vaccination 04/18/2019, 05/09/2019, 12/20/2019   Pfizer Covid-19 Vaccine Bivalent Booster 22yrs & up 12/08/2020   Pneumococcal Conjugate-13 05/24/2016   Pneumococcal Polysaccharide-23 12/26/2017   Tdap 04/24/2012   Zoster, Live 09/13/2013    Diabetes She presents for her initial diabetic visit. She has type 2 diabetes mellitus. Her disease course has been fluctuating. Hypoglycemia symptoms include nervousness/anxiousness, sweats and tremors. Associated symptoms include fatigue. There are no hypoglycemic complications. Symptoms are stable. Diabetic complications include nephropathy. Risk factors for coronary artery disease include diabetes mellitus, family history, hypertension, post-menopausal and sedentary lifestyle. Current diabetic treatment includes insulin  injections and oral agent (monotherapy). She is compliant with treatment most of the time. Her weight is fluctuating minimally. She is following a generally unhealthy diet. When asked about meal planning, she reported none. She has not had a previous visit with a dietitian. She participates in exercise intermittently. An ACE inhibitor/angiotensin II receptor blocker is being taken. She does not see a podiatrist.Eye exam is current.     Review of systems  Constitutional: + Minimally fluctuating body weight, current There is no height or weight on file to calculate BMI., no fatigue, no subjective hyperthermia, no subjective hypothermia Eyes: no blurry vision, no xerophthalmia ENT: no sore throat, no nodules palpated in throat, no dysphagia/odynophagia, no hoarseness Cardiovascular: no chest pain, no shortness of breath, no palpitations, no leg swelling Respiratory: no cough, no shortness of breath Gastrointestinal: no nausea/vomiting/diarrhea Musculoskeletal: no muscle/joint aches Skin: no rashes, no  hyperemia Neurological: no tremors, no numbness, no tingling, no dizziness Psychiatric: no depression, no anxiety  Objective:     There were no vitals taken for this visit.  Wt Readings from Last 3 Encounters:  05/12/23 131 lb 6.4 oz (59.6 kg)  04/20/23 133 lb 6.4 oz (60.5 kg)  12/09/22 136 lb (61.7 kg)     BP Readings from Last 3 Encounters:  06/15/23 (!) 146/76  05/12/23 132/71  04/20/23 130/80     Physical Exam- Limited  Constitutional:  There is no height or weight on file to calculate BMI. , not in acute distress, normal state of mind Eyes:  EOMI, no exophthalmos Neck: Supple Cardiovascular: RRR, no murmurs, rubs, or gallops, no edema Respiratory: Adequate breathing efforts, no crackles, rales, rhonchi, or wheezing Musculoskeletal: no gross deformities, strength intact in all four extremities, no gross restriction of joint movements Skin:  no rashes, no hyperemia Neurological: no tremor with outstretched hands   Diabetic Foot Exam - Simple   No data filed      CMP ( most recent) CMP     Component Value Date/Time   NA 143 04/20/2023 1502   K 4.5 04/20/2023 1502   CL 101 04/20/2023 1502   CO2 26 04/20/2023 1502   GLUCOSE 207 (H) 04/20/2023 1502   GLUCOSE 48 (L) 09/25/2022 1807   BUN 17 04/20/2023 1502   CREATININE 1.09 (H) 04/20/2023 1502   CREATININE 0.56 (L) 06/29/2019 1303   CALCIUM  10.5 (H) 04/20/2023 1502   PROT 7.0 04/20/2023 1502   ALBUMIN 4.6 04/20/2023 1502   AST 14 04/20/2023 1502   ALT 9 04/20/2023 1502   ALKPHOS 128 (H) 04/20/2023 1502   BILITOT 0.4 04/20/2023 1502   EGFR 54 (L) 04/20/2023 1502   GFRNONAA >60 09/25/2022 1807   GFRNONAA 94 06/29/2019 1303     Diabetic Labs (most recent): Lab Results  Component Value Date   HGBA1C 7.4 (H) 04/20/2023   HGBA1C 7.1 (A) 09/29/2022   HGBA1C 8.2 (H) 05/04/2022   MICROALBUR 304.0 (H) 12/04/2019   MICROALBUR 2.3 11/16/2018   MICROALBUR 17.9 (H) 07/13/2017     Lipid Panel ( most  recent) Lipid Panel     Component Value Date/Time   CHOL 201 (H) 10/01/2022 1607   TRIG 77 10/01/2022 1607   HDL 96 10/01/2022 1607   CHOLHDL 2.1 10/01/2022 1607   CHOLHDL 2.5 06/29/2019 1303   VLDL 10 09/30/2016 0917   LDLCALC 91 10/01/2022 1607   LDLCALC 116 (H) 06/29/2019 1303   LABVLDL 14 10/01/2022 1607      Lab Results  Component Value Date   TSH 0.917 10/01/2022   TSH 0.896 08/24/2021   TSH 0.72 02/20/2018   TSH 0.797 03/20/2010   TSH 1.938 10/11/2008           Assessment & Plan:   1) Type 2 diabetes mellitus with hyperglycemia, with long-term current use of insulin  (HCC) (Primary)    - [name] has currently uncontrolled symptomatic type 2 DM since *** years of age, with most recent A1c of *** %.   -Recent labs reviewed.  - I had a long discussion with her about the progressive nature of diabetes and the pathology behind its complications. -her diabetes is complicated by CKD stage 3a and she remains at a high risk for more acute and chronic complications which include CAD, CVA, CKD, retinopathy, and neuropathy. These are all discussed in detail with her.  The following Lifestyle Medicine recommendations according to American College of Lifestyle Medicine [facility]) were discussed and offered to patient and she agrees to start the journey:  A. Whole Foods, Plant-based plate comprising of fruits and vegetables, plant-based proteins, whole-grain carbohydrates was discussed in detail with the patient.   A list for source of those nutrients were also provided to the patient.  Patient will use only water  or unsweetened tea for hydration. B.  The need to stay away from risky substances including alcohol, smoking; obtaining 7 to 9 hours of restorative sleep,  at least 150 minutes of moderate intensity exercise weekly, the importance of healthy social connections,  and stress reduction techniques were discussed. C.  A full color page of  Calorie density of various food  groups per pound showing examples of each food groups was provided to the patient.  - I have counseled her on diet and weight management by adopting a carbohydrate restricted/protein rich diet. Patient is encouraged to switch to unprocessed or minimally processed complex starch and increased protein intake (animal or plant source), fruits, and vegetables. -  she is advised to stick to a routine mealtimes to eat 3 meals a day and avoid unnecessary snacks (to snack only to correct hypoglycemia).   - she acknowledges that there is a room for improvement in her food and drink choices. - Suggestion is made for her to avoid simple carbohydrates from her diet including Cakes, Sweet Desserts, Ice Cream, Soda (diet and regular), Sweet Tea, Candies, Chips, Cookies, Store Bought Juices, Alcohol in Excess of 1-2 drinks a day, Artificial Sweeteners, Coffee Creamer, and Sugar-free Products. This will help patient to have more stable blood glucose profile and potentially avoid unintended weight gain.  - I have approached her with the following individualized plan to manage her diabetes and patient agrees:    -she is encouraged to start/continue monitoring glucose 2 times daily, before breakfast and before bed, and to call the clinic if she has readings less than 70 or above 300 for 3 tests in a row.  - she is warned not to take insulin  without proper monitoring per orders. - Adjustment parameters are given to her for hypo and hyperglycemia in writing.  - she is not a candidate for *** due to concurrent renal insufficiency.  - she will be considered for incretin therapy as appropriate next visit.  - Specific targets for  A1c; LDL, HDL, and Triglycerides were discussed with the patient.  2) Blood Pressure /Hypertension:  her blood pressure is controlled to target.   she is advised to continue her current medications as prescribed by her PCP.  3) Lipids/Hyperlipidemia:    Review of her recent lipid panel  from 10/01/22 showed controlled LDL at 91 .  she is advised to continue Lipitor 10 mg daily at bedtime.  Side effects and precautions discussed with her.  4)  Weight/Diet:  her There is no height or weight on file to calculate BMI.  -  *** clearly complicating her diabetes care.   she is *** a candidate for weight loss. I discussed with her the fact that loss of 5 - 10% of her  current body weight will have the most impact on her diabetes management.  Exercise, and detailed carbohydrates information provided  -  detailed on discharge instructions.  5) Chronic Care/Health Maintenance: -she is on ACEI/ARB and Statin medications and is encouraged to initiate and continue to follow up with Ophthalmology, Dentist, Podiatrist at least yearly or according to recommendations, and advised to *** stay away from smoking. I have recommended yearly flu vaccine and pneumonia vaccine at least every 5 years; moderate intensity exercise for up to 150 minutes weekly; and sleep for at least 7 hours a day.  - she is advised to maintain close follow up with Antonetta Rollene BRAVO, MD for primary care needs, as well as her other providers for optimal and coordinated care.   - Time spent in this patient care: 60 min, which was spent in counseling her about her diabetes and the rest reviewing her  blood glucose logs, discussing her hypoglycemia and hyperglycemia episodes, reviewing her current and previous labs/studies (including abstraction from other facilities) and medications doses and developing a long term treatment plan based on the latest standards of care/guidelines; and documenting her care.    Please refer to Patient Instructions for Blood Glucose Monitoring and Insulin /Medications Dosing Guide in media tab for additional information. Please also refer to Patient Self Inventory in the Media tab for reviewed elements of pertinent patient history.  [name] participated in the discussions, expressed  understanding, and voiced agreement with the above plans.  All questions were answered to her satisfaction. she is encouraged to contact clinic should she have any questions or concerns prior to her return visit.     Follow up plan: - No follow-ups on file.    Benton Rio, [facility], [facility] [address] [place], [place] [address] Phone: [phone] Fax: [phone]  08/31/2023, 7:37 AM

## 2023-09-11 ENCOUNTER — Other Ambulatory Visit: Payer: Self-pay | Admitting: Family Medicine

## 2023-09-11 DIAGNOSIS — Z794 Long term (current) use of insulin: Secondary | ICD-10-CM

## 2023-09-25 ENCOUNTER — Other Ambulatory Visit: Payer: Self-pay | Admitting: Family Medicine

## 2023-10-07 ENCOUNTER — Other Ambulatory Visit: Payer: Self-pay | Admitting: Family Medicine

## 2023-10-19 ENCOUNTER — Encounter

## 2023-10-31 ENCOUNTER — Other Ambulatory Visit: Payer: Self-pay | Admitting: Family Medicine

## 2023-11-01 ENCOUNTER — Telehealth: Payer: Self-pay | Admitting: Family Medicine

## 2023-11-01 NOTE — Telephone Encounter (Signed)
 Patient came by the office and said needs a refill on meclizine  12.5 mg, per patient has not taken in a while but when she was taking it it seems to help.  Patient asking okay to take this?   Send refill to  [facility] pharmacy

## 2023-11-01 NOTE — Telephone Encounter (Signed)
 Lvm to cb. Calling to see what specific symptoms she is having and for how long

## 2023-11-04 ENCOUNTER — Ambulatory Visit: Admitting: Nurse Practitioner

## 2023-11-04 ENCOUNTER — Ambulatory Visit: Payer: Self-pay

## 2023-11-04 NOTE — Telephone Encounter (Signed)
 Patient scheduled.

## 2023-11-04 NOTE — Telephone Encounter (Signed)
 FYI Only or Action Required?: FYI only for provider.  Patient was last seen in primary care on 05/12/2023 by Antonetta Rollene BRAVO, MD.  Called Nurse Triage reporting Burning with urination.  Symptoms began a week ago.  Interventions attempted: Nothing.  Symptoms are: stable.  Triage Disposition: See Physician Within 24 Hours  Patient/caregiver understands and will follow disposition?: Yes     SimpCopied from CRM #[id number]. Topic: Clinical - Red Word Triage >> Nov 04, 2023  1:20 PM Donee H wrote: [facility] that prompted transfer to Nurse Triage: Patient is stating she think she has a UTI. She states she is experiencing pain when using the restroom. It has been going on for about a week now. Patient states tried to drink a lot of water  to help but it's not, it just keep getting worse. Reason for Disposition  Age > 50 years  Answer Assessment - Initial Assessment Questions Has no taken any medication.    1. SEVERITY: How bad is the pain?  (e.g., Scale 1-10; mild, moderate, or severe)     Discomfort  2. FREQUENCY: How many times have you had painful urination today?      Every time I go its painful   3. PATTERN: Is pain present every time you urinate or just sometimes?      Yes  4. ONSET: When did the painful urination start?      Sunday  5. FEVER: Do you have a fever? If Yes, ask: What is your temperature, how was it measured, and when did it start?     No  6. PAST UTI: Have you had a urine infection before? If Yes, ask: When was the last time? and What happened that time?      Yes  8. OTHER SYMPTOMS: Do you have any other symptoms? (e.g., blood in urine, flank pain, genital sores, urgency, vaginal discharge)     No  Protocols used: Urination Pain - Female-A-AH

## 2023-11-08 ENCOUNTER — Telehealth: Payer: Self-pay

## 2023-11-08 NOTE — Telephone Encounter (Signed)
 Dysuria  Patient called with c/o dysuria x 1 week days.  Pain: burning  Severity:4/10  Associated Signs and Symptoms:  Fever: noTemp.0 Chills: no Hematuria: no Urgency: yes Frequency: yes Hesitancy:no Incontinence: no Nausea: no Vomiting: no  Urologic History:  Any Recent Urologic Surgeries or Procedures:no Recurrent UTI's:yes Cystitis: yes  Prostatitis:no Kidney or Bladder Stones: yes Plan: Walk-in Clinic: no Appointment w/Physician: [no Lab visit scheduled for urine drop off: Yes Advice given:  Do you take on daily medications for UTI suppression No Ucora

## 2023-11-09 ENCOUNTER — Other Ambulatory Visit: Payer: Self-pay

## 2023-11-09 ENCOUNTER — Telehealth: Payer: Self-pay

## 2023-11-09 ENCOUNTER — Other Ambulatory Visit (INDEPENDENT_AMBULATORY_CARE_PROVIDER_SITE_OTHER)

## 2023-11-09 DIAGNOSIS — N390 Urinary tract infection, site not specified: Secondary | ICD-10-CM

## 2023-11-09 LAB — URINALYSIS, ROUTINE W REFLEX MICROSCOPIC
Bilirubin, UA: NEGATIVE
Glucose, UA: NEGATIVE
Ketones, UA: NEGATIVE
Nitrite, UA: POSITIVE — AB
Specific Gravity, UA: 1.025 (ref 1.005–1.030)
Urobilinogen, Ur: 0.2 mg/dL (ref 0.2–1.0)
pH, UA: 6 (ref 5.0–7.5)

## 2023-11-09 LAB — MICROSCOPIC EXAMINATION
RBC, Urine: >30 /HPF — AB (ref 0–2)
WBC, UA: >30 /HPF — AB (ref 0–5)

## 2023-11-09 MED ORDER — DOXYCYCLINE HYCLATE 100 MG PO CAPS: 100.0000 mg | ORAL_CAPSULE | Freq: Two times a day (BID) | ORAL | 0 refills | Status: DC

## 2023-11-09 NOTE — Telephone Encounter (Signed)
 Patient left a urine for analysis. Has been taking Uqora before giving specimen.  Please advise.

## 2023-11-09 NOTE — Progress Notes (Signed)
 Patient presents today with complaints of  UTI.  UA and Culture done today.  Dr. Sherrilee reviewed results and Doxycyline 100 mg  .  Patient aware of MD recommendations and that we will reach out with culture results.      Dyjwpvlj, CMA

## 2023-11-09 NOTE — Telephone Encounter (Signed)
 Patient presents today with complaints of  UTI.  UA and Culture done today.  Dr. Sherrilee reviewed results and Doxycyline 100 Mg .  Patient aware of MD recommendations and that we will reach out with culture results.      Dyjwpvlj, CMA

## 2023-11-11 ENCOUNTER — Ambulatory Visit: Payer: Self-pay

## 2023-11-11 LAB — URINE CULTURE

## 2023-11-21 ENCOUNTER — Telehealth: Payer: Self-pay | Admitting: Family Medicine

## 2023-11-21 ENCOUNTER — Other Ambulatory Visit: Payer: Self-pay

## 2023-11-21 MED ORDER — OLMESARTAN MEDOXOMIL 20 MG PO TABS: 20.0000 mg | ORAL_TABLET | Freq: Every day | ORAL | 0 refills | Status: DC

## 2023-11-21 NOTE — Telephone Encounter (Signed)
 Refill sent to pharmacy.

## 2023-11-21 NOTE — Telephone Encounter (Signed)
 Prescription Request  11/21/2023  LOV: 05/12/2023  What is the name of the medication or equipment? olmesartan  (BENICAR ) 20 MG tablet [[id number]]    Have you contacted your pharmacy to request a refill? Yes   Which pharmacy would you like this sent to?   [facility]   Patient notified that their request is being sent to the clinical staff for review and that they should receive a response within 2 business days.   Please advise at walked into office

## 2023-11-24 ENCOUNTER — Telehealth: Payer: Self-pay | Admitting: Urology

## 2023-11-24 NOTE — Telephone Encounter (Signed)
 Dysuria  Patient called with c/o dysuria x 2-3 days days.  Pain: Feels like a stone is in the ureter  Severity:5/10  Associated Signs and Symptoms:  Fever: noTemp.0 Chills: no Hematuria: no Urgency: no Frequency: no Hesitancy:no Incontinence: no Nausea: no Vomiting: no  Urologic History:  Any Recent Urologic Surgeries or Procedures:no Recurrent UTI's:yes Cystitis: no  Prostatitis:no Kidney or Bladder Stones: yes- kidney stones  Plan: Walk-in Clinic: no Appointment w/Physician: [no Lab visit scheduled for urine drop off: Yes Advice given:  Do you take on daily medications for UTI suppression No

## 2023-11-24 NOTE — Telephone Encounter (Signed)
 Patient called into the office today with general questions/concerns regarding UTI. She has taken antibiotic and still is not feeling right Patient may be reached at [phone] to discuss questions.

## 2023-12-01 ENCOUNTER — Other Ambulatory Visit

## 2023-12-01 NOTE — Telephone Encounter (Signed)
 Patient drank more water  and is feeling better at this time

## 2023-12-06 ENCOUNTER — Other Ambulatory Visit: Payer: Self-pay

## 2023-12-06 ENCOUNTER — Telehealth: Payer: Self-pay

## 2023-12-06 DIAGNOSIS — N200 Calculus of kidney: Secondary | ICD-10-CM

## 2023-12-06 NOTE — Telephone Encounter (Signed)
 Stone analysis order placed by CMA

## 2023-12-06 NOTE — Telephone Encounter (Signed)
 Pt states she believes she is passing more stones urine is coming out with a sandy texture feels like her previous UTI pt states she is in pain tried to urinate and felt a lot of pain 8/10 pt states she drank some water  to see if it'd help eliminate the pain pt also stated she knows she does not have a UTI because the pain is different was advised she had an order in for a KUB if she believes she is passing a stone/ has a stone pt was again asked if she wanted to come in for ua drop but pt declined due to believing she is having a stone passed pt was advised to c/b if she passes a stone but and get her KUB in the meantime

## 2023-12-06 NOTE — Telephone Encounter (Signed)
 Patient dropped off kidney stones she passed around 2 pm today.

## 2023-12-15 ENCOUNTER — Ambulatory Visit (HOSPITAL_COMMUNITY)

## 2023-12-15 LAB — STONE ANALYSIS
Calcium Oxalate Dihydrate: 70 %
Calcium Oxalate Monohydrate: 30 %
Weight Calculi: 177 mg

## 2023-12-20 ENCOUNTER — Ambulatory Visit

## 2023-12-20 ENCOUNTER — Other Ambulatory Visit: Payer: Self-pay

## 2023-12-20 ENCOUNTER — Telehealth: Payer: Self-pay | Admitting: Family Medicine

## 2023-12-20 MED ORDER — AMLODIPINE BESYLATE 5 MG PO TABS: 5.0000 mg | ORAL_TABLET | Freq: Every day | ORAL | 0 refills | Status: DC

## 2023-12-20 NOTE — Progress Notes (Deleted)
 Patient is in office today for a nurse visit for Blood Pressure Check. Patients Blood Pressure was 148/84.

## 2023-12-20 NOTE — Telephone Encounter (Signed)
 Prescription Request  12/20/2023  LOV: 05/12/2023  What is the name of the medication or equipment? amLODipine  (NORVASC ) 5 MG tablet [[id number]]   Have you contacted your pharmacy to request a refill? Yes   Which pharmacy would you like this sent to?  [facility]   Patient notified that their request is being sent to the clinical staff for review and that they should receive a response within 2 business days.   Please advise at walked into office

## 2023-12-20 NOTE — Progress Notes (Signed)
 Patient is in office today for a nurse visit for Blood Pressure Check. Patient blood pressure was 150/84, Patient reports she felt dizzy at 8 am went and laid back down. Patient came in to have blood pressure checked and reports her medication expired in September of 2025. Patient provided with an appointment tomorrow

## 2023-12-20 NOTE — Telephone Encounter (Signed)
 Sent!

## 2023-12-21 ENCOUNTER — Ambulatory Visit

## 2023-12-21 ENCOUNTER — Ambulatory Visit: Admitting: Physician Assistant

## 2023-12-21 VITALS — BP 136/86 | HR 85 | Ht 65.0 in | Wt 129.1 lb

## 2023-12-21 DIAGNOSIS — E1169 Type 2 diabetes mellitus with other specified complication: Secondary | ICD-10-CM | POA: Diagnosis not present

## 2023-12-21 DIAGNOSIS — E782 Mixed hyperlipidemia: Secondary | ICD-10-CM | POA: Diagnosis not present

## 2023-12-21 DIAGNOSIS — E559 Vitamin D deficiency, unspecified: Secondary | ICD-10-CM | POA: Diagnosis not present

## 2023-12-21 DIAGNOSIS — I10 Essential (primary) hypertension: Secondary | ICD-10-CM

## 2023-12-21 DIAGNOSIS — I5032 Chronic diastolic (congestive) heart failure: Secondary | ICD-10-CM | POA: Insufficient documentation

## 2023-12-21 DIAGNOSIS — R42 Dizziness and giddiness: Secondary | ICD-10-CM

## 2023-12-21 DIAGNOSIS — Z794 Long term (current) use of insulin: Secondary | ICD-10-CM

## 2023-12-21 MED ORDER — MECLIZINE HCL 12.5 MG PO TABS: ORAL_TABLET | ORAL | 2 refills | Status: DC

## 2023-12-21 NOTE — Progress Notes (Signed)
 Established Patient Office Visit  Subjective   Patient ID: [name], female    DOB: 1950/03/30  Age: 73 y.o. MRN: [id number]  Chief Complaint  Patient presents with   Medical Management of Chronic Issues    Pt here for dizzy spells, Pt states It started when I woke up yesterday morning   Patient has not took any meds this am besides her metformin       HPI Discussed the use of AI scribe software for clinical note transcription with the patient, who gave verbal consent to proceed.  History of Present Illness   [name] is a 73 year old female with hypertension who presents with dizziness and imbalance.  Dizziness and imbalance - Dizziness and imbalance began yesterday morning - Sensation described as being off balance, particularly on the right side, rather than vertigo - Felt as if drifting to the right and struggled to walk straight - Possible room-spinning sensation when first getting up yesterday - Slight improvement today but dizziness persists - Similar episode in April with cautious monitoring of blood pressure - Previous prescription for meclizine  for dizziness, which was helpful but not refilled  Hypertension and antihypertensive medication use - History of hypertension - Currently prescribed two antihypertensive medications, including amlodipine  - Amlodipine  has been used for a while without issues - Second antihypertensive medication is relatively new - Took medication yesterday after experiencing dizziness - Did not take medication this morning, suspecting it might be contributing to symptoms - Does not regularly check blood pressure at home due to difficulty using the apparatus  Renal calculi and recent urological procedure - History of kidney stones - Recent procedure approximately two weeks ago for stones that had not moved - Believes stones started moving recently, causing discomfort  Hydration status - Dry throat in the morning - Has  started drinking more water  but acknowledges needing to increase intake further  History of falls and neurological evaluation - CT scan of the brain in July after a fall, which was normal      Patient Active Problem List   Diagnosis Date Noted   Chronic diastolic congestive heart failure (HCC) 12/21/2023   Post-menopause 05/13/2023   Medically complex patient 05/13/2023   Carotid bruit present 05/13/2023   Vitamin D  deficiency 05/13/2023   Recurrent UTI 05/11/2023   Fall 09/30/2022   Hypoglycemia 09/30/2022   History of colonic polyps 09/17/2022   Dermoid cyst of skin of back 05/27/2022   DJD (degenerative joint disease) of cervical spine 05/04/2022   Chronic vaginitis 10/15/2021   Unsteady gait 02/03/2021   Headache disorder 02/03/2021   Common bile duct dilatation 06/28/2020   Hyperpigmentation 06/17/2020   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4 oz (59.6 kg)  04/20/23 133 lb 6.4 oz (60.5 kg)  Physical Exam Vitals and nursing note reviewed.  Constitutional:      Appearance: Normal appearance.  HENT:     Head: Normocephalic.  Eyes:     Extraocular Movements: Extraocular movements intact.     Pupils: Pupils are equal, round, and reactive to light.  Cardiovascular:     Rate and Rhythm: Normal rate and regular rhythm.  Pulmonary:     Effort: Pulmonary  effort is normal.     Breath sounds: Normal breath sounds.  Musculoskeletal:     Cervical back: Normal range of motion and neck supple.  Neurological:     Mental Status: She is alert and oriented to person, place, and time.  Psychiatric:        Mood and Affect: Mood normal.        Thought Content: Thought content normal.       The 10-year ASCVD risk score (Arnett DK, et al., 2019) is: 46.2%    Assessment & Plan:   Problem List Items Addressed This Visit       Cardiovascular and Mediastinum   Essential hypertension   Hypertension managed with amlodipine  and another antihypertensive. Blood pressure 122/76 mmHg without morning medication. Advised continuation of current regimen and discussed home monitoring. - Continue current antihypertensive medications. - Encourage home blood pressure monitoring with digital device. - Order blood work to assess current status.        Endocrine   Type 2 diabetes mellitus with other specified complication (HCC) - Primary   Update labs today.       Relevant Orders   TSH (Completed)   CBC with Differential/Platelet (Completed)   CMP14+EGFR (Completed)     Other   Hyperlipidemia   Update lipid panel. Treated with atorvastatin  10 mg.       Relevant Orders   Lipid panel (Completed)   Vertigo   Meclizine  as needed for symptom control.  Recurrent dizziness and imbalance, primarily off balance rather than vertigo, with recent onset. Symptoms suggest possible right-sided inner ear involvement. Previous brain CT normal. Meclizine  effective previously. - Prescribe meclizine  with refills. - Consider neurologist referral if symptoms persist. - Consider MRI if symptoms persist.      Relevant Medications   meclizine  (ANTIVERT ) 12.5 MG tablet   Vitamin D  deficiency   Update vitamin D  level.       Relevant Orders   VITAMIN D  25 Hydroxy (Vit-D Deficiency, Fractures) (Completed)    Return in about 3 months (around 03/22/2024) for chronic  follow-up with PCP, as needed.    Leita Longs, FNP

## 2023-12-22 LAB — CMP14+EGFR
ALT: 10 IU/L (ref 0–32)
AST: 12 IU/L (ref 0–40)
Albumin: 4.6 g/dL (ref 3.8–4.8)
Alkaline Phosphatase: 132 IU/L (ref 49–135)
BUN/Creatinine Ratio: 16 (ref 12–28)
BUN: 13 mg/dL (ref 8–27)
Bilirubin Total: 0.7 mg/dL (ref 0.0–1.2)
CO2: 23 mmol/L (ref 20–29)
Calcium: 10 mg/dL (ref 8.7–10.3)
Chloride: 99 mmol/L (ref 96–106)
Creatinine, Ser: 0.79 mg/dL (ref 0.57–1.00)
Globulin, Total: 2.8 g/dL (ref 1.5–4.5)
Glucose: 180 mg/dL — ABNORMAL HIGH (ref 70–99)
Potassium: 4.4 mmol/L (ref 3.5–5.2)
Sodium: 141 mmol/L (ref 134–144)
Total Protein: 7.4 g/dL (ref 6.0–8.5)
eGFR: 78 mL/min/1.73 (ref >59)

## 2023-12-22 LAB — LIPID PANEL
Chol/HDL Ratio: 2.6 ratio (ref 0.0–4.4)
Cholesterol, Total: 249 mg/dL — ABNORMAL HIGH (ref 100–199)
HDL: 96 mg/dL (ref >39)
LDL Chol Calc (NIH): 141 mg/dL — ABNORMAL HIGH (ref 0–99)
Triglycerides: 71 mg/dL (ref 0–149)
VLDL Cholesterol Cal: 12 mg/dL (ref 5–40)

## 2023-12-22 LAB — VITAMIN D 25 HYDROXY (VIT D DEFICIENCY, FRACTURES): Vit D, 25-Hydroxy: 25.1 ng/mL — ABNORMAL LOW (ref 30.0–100.0)

## 2023-12-22 LAB — CBC WITH DIFFERENTIAL/PLATELET
Basophils Absolute: 0 x10E3/uL (ref 0.0–0.2)
Basos: 1 %
EOS (ABSOLUTE): 0.1 x10E3/uL (ref 0.0–0.4)
Eos: 1 %
Hematocrit: 41.3 % (ref 34.0–46.6)
Hemoglobin: 13.2 g/dL (ref 11.1–15.9)
Immature Grans (Abs): 0 x10E3/uL (ref 0.0–0.1)
Immature Granulocytes: 0 %
Lymphocytes Absolute: 1 x10E3/uL (ref 0.7–3.1)
Lymphs: 26 %
MCH: 29.1 pg (ref 26.6–33.0)
MCHC: 32 g/dL (ref 31.5–35.7)
MCV: 91 fL (ref 79–97)
Monocytes Absolute: 0.3 x10E3/uL (ref 0.1–0.9)
Monocytes: 9 %
Neutrophils Absolute: 2.4 x10E3/uL (ref 1.4–7.0)
Neutrophils: 63 %
Platelets: 318 x10E3/uL (ref 150–450)
RBC: 4.53 x10E6/uL (ref 3.77–5.28)
RDW: 11.4 % — ABNORMAL LOW (ref 11.7–15.4)
WBC: 3.8 x10E3/uL (ref 3.4–10.8)

## 2023-12-22 LAB — TSH: TSH: 0.585 u[IU]/mL (ref 0.450–4.500)

## 2023-12-25 NOTE — Assessment & Plan Note (Signed)
 Update lipid panel. Treated with atorvastatin  10 mg.

## 2023-12-25 NOTE — Assessment & Plan Note (Signed)
 Hypertension managed with amlodipine  and another antihypertensive. Blood pressure 122/76 mmHg without morning medication. Advised continuation of current regimen and discussed home monitoring. - Continue current antihypertensive medications. - Encourage home blood pressure monitoring with digital device. - Order blood work to assess current status.

## 2023-12-25 NOTE — Assessment & Plan Note (Addendum)
 Meclizine  as needed for symptom control.  Recurrent dizziness and imbalance, primarily off balance rather than vertigo, with recent onset. Symptoms suggest possible right-sided inner ear involvement. Previous brain CT normal. Meclizine  effective previously. - Prescribe meclizine  with refills. - Consider neurologist referral if symptoms persist. - Consider MRI if symptoms persist.

## 2023-12-25 NOTE — Assessment & Plan Note (Signed)
 Update labs today

## 2023-12-25 NOTE — Assessment & Plan Note (Signed)
Update vitamin D level. 

## 2024-01-12 ENCOUNTER — Other Ambulatory Visit: Payer: Self-pay | Admitting: Family Medicine

## 2024-01-12 ENCOUNTER — Encounter (INDEPENDENT_AMBULATORY_CARE_PROVIDER_SITE_OTHER): Payer: Self-pay | Admitting: Gastroenterology

## 2024-01-17 ENCOUNTER — Ambulatory Visit (HOSPITAL_COMMUNITY)
Admission: RE | Admit: 2024-01-17 | Discharge: 2024-01-17 | Disposition: A | Discharge status: Home / Self Care | Source: Ambulatory Visit | Attending: Urology | Admitting: Urology

## 2024-01-17 DIAGNOSIS — N200 Calculus of kidney: Secondary | ICD-10-CM | POA: Insufficient documentation

## 2024-01-29 ENCOUNTER — Other Ambulatory Visit: Payer: Self-pay | Admitting: Family Medicine

## 2024-03-11 ENCOUNTER — Ambulatory Visit: Payer: Self-pay

## 2024-03-11 ENCOUNTER — Other Ambulatory Visit: Payer: Self-pay

## 2024-03-11 DIAGNOSIS — E559 Vitamin D deficiency, unspecified: Secondary | ICD-10-CM

## 2024-03-11 MED ORDER — VITAMIN D (ERGOCALCIFEROL) 1.25 MG (50000 UNIT) PO CAPS: 50000.0000 [IU] | ORAL_CAPSULE | ORAL | 3 refills | Status: DC

## 2024-03-30 ENCOUNTER — Ambulatory Visit: Admitting: Family Medicine

## 2024-04-08 DEATH — deceased

## 2024-04-13 ENCOUNTER — Encounter: Admitting: Family Medicine

## 2024-04-25 ENCOUNTER — Ambulatory Visit: Admitting: Urology
